# Patient Record
Sex: Male | Born: 1965
Health system: Southern US, Community
[De-identification: ages and names within clinical notes are randomized; demographics above are authoritative.]

## PROBLEM LIST (undated history)

## (undated) DIAGNOSIS — I10 Essential (primary) hypertension: Secondary | ICD-10-CM

## (undated) DIAGNOSIS — E119 Type 2 diabetes mellitus without complications: Secondary | ICD-10-CM

## (undated) DIAGNOSIS — T7840XA Allergy, unspecified, initial encounter: Secondary | ICD-10-CM

## (undated) HISTORY — DX: Allergy, unspecified, initial encounter: T78.40XA

---

## 2002-04-12 ENCOUNTER — Emergency Department (HOSPITAL_COMMUNITY): Admission: EM | Admit: 2002-04-12 | Discharge: 2002-04-12 | Payer: Self-pay | Admitting: Emergency Medicine

## 2004-02-22 ENCOUNTER — Emergency Department (HOSPITAL_COMMUNITY): Admission: EM | Admit: 2004-02-22 | Discharge: 2004-02-22 | Payer: Self-pay | Admitting: Emergency Medicine

## 2015-09-15 ENCOUNTER — Encounter (HOSPITAL_COMMUNITY): Payer: Self-pay | Admitting: Emergency Medicine

## 2015-09-15 ENCOUNTER — Inpatient Hospital Stay (HOSPITAL_COMMUNITY)
Admission: EM | Admit: 2015-09-15 | Discharge: 2015-09-19 | DRG: 638 | Disposition: A | Discharge status: Home / Self Care | Payer: Self-pay | Attending: Internal Medicine | Admitting: Internal Medicine

## 2015-09-15 DIAGNOSIS — E1110 Type 2 diabetes mellitus with ketoacidosis without coma: Secondary | ICD-10-CM | POA: Diagnosis present

## 2015-09-15 DIAGNOSIS — E119 Type 2 diabetes mellitus without complications: Secondary | ICD-10-CM

## 2015-09-15 DIAGNOSIS — Z9114 Patient's other noncompliance with medication regimen: Secondary | ICD-10-CM

## 2015-09-15 DIAGNOSIS — D649 Anemia, unspecified: Secondary | ICD-10-CM | POA: Diagnosis present

## 2015-09-15 DIAGNOSIS — E1310 Other specified diabetes mellitus with ketoacidosis without coma: Principal | ICD-10-CM | POA: Diagnosis present

## 2015-09-15 DIAGNOSIS — N179 Acute kidney failure, unspecified: Secondary | ICD-10-CM | POA: Diagnosis present

## 2015-09-15 DIAGNOSIS — E876 Hypokalemia: Secondary | ICD-10-CM | POA: Diagnosis present

## 2015-09-15 HISTORY — DX: Essential (primary) hypertension: I10

## 2015-09-15 HISTORY — DX: Type 2 diabetes mellitus without complications: E11.9

## 2015-09-15 LAB — BASIC METABOLIC PANEL
BUN: 56 mg/dL — ABNORMAL HIGH (ref 6–20)
CO2: <7 mmol/L — ABNORMAL LOW (ref 22–32)
Calcium: 9.1 mg/dL (ref 8.9–10.3)
Chloride: 105 mmol/L (ref 101–111)
Creatinine, Ser: 3.09 mg/dL — ABNORMAL HIGH (ref 0.61–1.24)
GFR calc Af Amer: 25 mL/min — ABNORMAL LOW (ref >60)
GFR calc non Af Amer: 22 mL/min — ABNORMAL LOW (ref >60)
Glucose, Bld: 696 mg/dL (ref 65–99)
Potassium: 5.8 mmol/L — ABNORMAL HIGH (ref 3.5–5.1)
Sodium: 147 mmol/L — ABNORMAL HIGH (ref 135–145)

## 2015-09-15 LAB — BLOOD GAS, ARTERIAL
Acid-base deficit: 26.6 mmol/L — ABNORMAL HIGH (ref 0.0–2.0)
Bicarbonate: 3.7 mEq/L — ABNORMAL LOW (ref 20.0–24.0)
Drawn by: 232811
FIO2: 0.21
O2 Saturation: 97.2 %
Patient temperature: 97.4
TCO2: 3.6 mmol/L (ref 0–100)
pCO2 arterial: 13.1 mmHg — CL (ref 35.0–45.0)
pH, Arterial: 7.069 — CL (ref 7.350–7.450)
pO2, Arterial: 130 mmHg — ABNORMAL HIGH (ref 80.0–100.0)

## 2015-09-15 LAB — CBC WITH DIFFERENTIAL/PLATELET
Basophils Absolute: 0 10*3/uL (ref 0.0–0.1)
Basophils Relative: 0 %
Eosinophils Absolute: 0 10*3/uL (ref 0.0–0.7)
Eosinophils Relative: 0 %
HCT: 47.5 % (ref 39.0–52.0)
Hemoglobin: 15.1 g/dL (ref 13.0–17.0)
Lymphocytes Relative: 4 %
Lymphs Abs: 0.7 10*3/uL (ref 0.7–4.0)
MCH: 34.8 pg — ABNORMAL HIGH (ref 26.0–34.0)
MCHC: 31.8 g/dL (ref 30.0–36.0)
MCV: 109.4 fL — ABNORMAL HIGH (ref 78.0–100.0)
Monocytes Absolute: 0.8 10*3/uL (ref 0.1–1.0)
Monocytes Relative: 5 %
Neutro Abs: 13.9 10*3/uL — ABNORMAL HIGH (ref 1.7–7.7)
Neutrophils Relative %: 91 %
Platelets: 363 10*3/uL (ref 150–400)
RBC: 4.34 MIL/uL (ref 4.22–5.81)
RDW: 12.1 % (ref 11.5–15.5)
WBC: 15.5 10*3/uL — ABNORMAL HIGH (ref 4.0–10.5)

## 2015-09-15 LAB — CBG MONITORING, ED
Glucose-Capillary: >600 mg/dL (ref 65–99)
Glucose-Capillary: >600 mg/dL (ref 65–99)

## 2015-09-15 MED ORDER — SODIUM CHLORIDE 0.9 % IV BOLUS (SEPSIS)
1000.0000 mL | Freq: Once | INTRAVENOUS | Status: AC
  Administered 2015-09-15: 1000 mL via INTRAVENOUS

## 2015-09-15 MED ORDER — INSULIN REGULAR HUMAN 100 UNIT/ML IJ SOLN
INTRAMUSCULAR | Status: DC
  Administered 2015-09-15: 5.4 [IU]/h via INTRAVENOUS
  Filled 2015-09-15: qty 2.5

## 2015-09-15 MED ORDER — INSULIN ASPART 100 UNIT/ML ~~LOC~~ SOLN
10.0000 [IU] | Freq: Once | SUBCUTANEOUS | Status: AC
  Administered 2015-09-15: 10 [IU] via SUBCUTANEOUS
  Filled 2015-09-15: qty 1

## 2015-09-15 MED ORDER — SODIUM CHLORIDE 0.9 % IV BOLUS (SEPSIS)
1000.0000 mL | Freq: Once | INTRAVENOUS | Status: AC
  Administered 2015-09-16: 1000 mL via INTRAVENOUS

## 2015-09-15 MED ORDER — SODIUM CHLORIDE 0.9 % IV SOLN
INTRAVENOUS | Status: DC
Start: 2015-09-16 — End: 2015-09-17

## 2015-09-15 NOTE — ED Notes (Signed)
EKG given to EDP,Lockwood,MD., for review. 

## 2015-09-15 NOTE — ED Notes (Signed)
Notified RN,Christine, pt. CBG reading critical high on meter > 600.

## 2015-09-15 NOTE — ED Provider Notes (Addendum)
Medical screening examination/treatment/procedure(s) were conducted as a shared visit with non-physician practitioner(s) and myself.  I personally evaluated the patient during the encounter.   EKG Interpretation   Date/Time:  Saturday September 15 2015 23:09:09 EST Ventricular Rate:  119 PR Interval:  131 QRS Duration: 85 QT Interval:  356 QTC Calculation: 501 R Axis:   73 Text Interpretation:  Sinus tachycardia Probable left atrial enlargement  Borderline repolarization abnormality Prolonged QT interval  No prior EKG  for comparison  Confirmed by Nazeer Romney MD, Tabetha Haraway [phone]) on 09/15/2015 11:11:56 PM      CRITICAL CARE Performed by: Lavera Guise   Total critical care time: 30 minutes  Critical care time was exclusive of separately billable procedures and treating other patients.  Critical care was necessary to treat or prevent imminent or life-threatening deterioration.  Critical care was time spent personally by me on the following activities: development of treatment plan with patient and/or surrogate as well as nursing, discussions with consultants, evaluation of patient's response to treatment, examination of patient, obtaining history from patient or surrogate, ordering and performing treatments and interventions, ordering and review of laboratory studies, ordering and review of radiographic studies, pulse oximetry and re-evaluation of patient's condition.   50 year old male with history of non-insulin-dependent diabetes who presents with hyperglycemia. Has not been compliant with metformin, and has recently reported increased fatigue, polyuria and polydipsia over the past 3-4 days. Has not had chest pain, difficulty breathing, fevers, nausea or vomiting, diarrhea, abdominal pain, cough or shortness of breath. On arrival with glucose greater than 600. Is tachycardic and appears dry on exam. Appears listless and mildly confused. Concern for severe DKA given anion gap of 30, pH of 7.0,  bicarbonate 3. Is resuscitated with IV fluids and started on insulin drip. Admitted to ICU for ongoing management.  Lavera Guise, MD 09/15/15 2356  Lavera Guise, MD 09/15/15 [phone]

## 2015-09-15 NOTE — ED Notes (Signed)
Pt presents via EMS from home with hyperglycemia (HIGH on EMS glucometer) and hypotension (80/50 per EMS).  600cc normal saline infused en route, new BP 110/60 per EMS.  NIDDM with hypertension, altered mental status on arrival.  Lethargic and slow to respond but alert and oriented intermittently.

## 2015-09-15 NOTE — ED Provider Notes (Signed)
CSN: [id number]     Arrival date & time 09/15/15  2036 History   First MD Initiated Contact with Patient 09/15/15 2050     Chief Complaint  Patient presents with  . Hyperglycemia  . Altered Mental Status     (Consider location/radiation/quality/duration/timing/severity/associated sxs/prior Treatment) HPI Comments: Patient presents to the emergency department with chief complaint of hyperglycemia. He states that he has been feeling exceptionally thirsty and fatigued for the past couple days. He also reports increased urinary frequency. He states that he takes insulin and metformin, but doesn't normally need to use the insulin.  (Patient stated later that he has been "weening" himself off metformin for the past 5 months).  He denies any fevers, chills, cough, cp, sob, abdominal pain.  There are no aggravating or alleviating factors.  The history is provided by the patient. No language interpreter was used.    Past Medical History  Diagnosis Date  . Diabetes mellitus without complication (HCC)   . Hypertension    History reviewed. No pertinent past surgical history. No family history on file. Social History  Substance Use Topics  . Smoking status: Never Smoker   . Smokeless tobacco: Never Used  . Alcohol Use: Yes     Comment: occasional    Review of Systems  Constitutional: Positive for fatigue. Negative for fever and chills.  Respiratory: Negative for shortness of breath.   Cardiovascular: Negative for chest pain.  Gastrointestinal: Negative for nausea, vomiting, diarrhea and constipation.  Endocrine: Positive for polydipsia and polyuria.  Genitourinary: Negative for dysuria.  All other systems reviewed and are negative.     Allergies  Review of patient's allergies indicates no known allergies.  Home Medications   Prior to Admission medications   Medication Sig Start Date End Date Taking? Authorizing Provider  METFORMIN HCL PO Take by mouth.   Yes Historical Provider,  MD  PRESCRIPTION MEDICATION insulin   Yes Historical Provider, MD   BP 105/68 mmHg  Pulse 119  Temp(Src) 97.4 F (36.3 C) (Oral)  Resp 18  SpO2 99% Physical Exam  Constitutional: He is oriented to person, place, and time. He appears well-developed and well-nourished.  HENT:  Head: Normocephalic and atraumatic.  Eyes: Conjunctivae and EOM are normal. Pupils are equal, round, and reactive to light. Right eye exhibits no discharge. Left eye exhibits no discharge. No scleral icterus.  Neck: Normal range of motion. Neck supple. No JVD present.  Cardiovascular: Regular rhythm and normal heart sounds.  Exam reveals no gallop and no friction rub.   No murmur heard. tachycardia  Pulmonary/Chest: Effort normal and breath sounds normal. No respiratory distress. He has no wheezes. He has no rales. He exhibits no tenderness.  Abdominal: Soft. He exhibits no distension and no mass. There is no tenderness. There is no rebound and no guarding.  Musculoskeletal: Normal range of motion. He exhibits no edema or tenderness.  Neurological: He is alert and oriented to person, place, and time.  Skin: Skin is warm and dry.  Psychiatric: He has a normal mood and affect. His behavior is normal. Judgment and thought content normal.  Nursing note and vitals reviewed.   ED Course  Procedures (including critical care time) Results for orders placed or performed during the hospital encounter of 09/15/15  CBC with Differential/Platelet  Result Value Ref Range   WBC 15.5 (H) 4.0 - 10.5 K/uL   RBC 4.34 4.22 - 5.81 MIL/uL   Hemoglobin 15.1 13.0 - 17.0 g/dL   HCT 69.6 29.5 -  52.0 %   MCV 109.4 (H) 78.0 - 100.0 fL   MCH 34.8 (H) 26.0 - 34.0 pg   MCHC 31.8 30.0 - 36.0 g/dL   RDW 16.1 09.6 - 04.5 %   Platelets 363 150 - 400 K/uL   Neutrophils Relative % 91 %   Neutro Abs 13.9 (H) 1.7 - 7.7 K/uL   Lymphocytes Relative 4 %   Lymphs Abs 0.7 0.7 - 4.0 K/uL   Monocytes Relative 5 %   Monocytes Absolute 0.8 0.1 -  1.0 K/uL   Eosinophils Relative 0 %   Eosinophils Absolute 0.0 0.0 - 0.7 K/uL   Basophils Relative 0 %   Basophils Absolute 0.0 0.0 - 0.1 K/uL  Basic metabolic panel  Result Value Ref Range   Sodium 147 (H) 135 - 145 mmol/L   Potassium 5.8 (H) 3.5 - 5.1 mmol/L   Chloride 105 101 - 111 mmol/L   CO2 <7 (L) 22 - 32 mmol/L   Glucose, Bld 696 (HH) 65 - 99 mg/dL   BUN 56 (H) 6 - 20 mg/dL   Creatinine, Ser 4.09 (H) 0.61 - 1.24 mg/dL   Calcium 9.1 8.9 - 81.1 mg/dL   GFR calc non Af Amer 22 (L) >60 mL/min   GFR calc Af Amer 25 (L) >60 mL/min   Anion gap NOT CALCULATED 5 - 15  CBG monitoring, ED  Result Value Ref Range   Glucose-Capillary >600 (HH) 65 - 99 mg/dL   Comment 1 Notify RN    No results found.  I have personally reviewed and evaluated these images and lab results as part of my medical decision-making.   EKG Interpretation   Date/Time:  Saturday September 15 2015 23:09:09 EST Ventricular Rate:  119 PR Interval:  131 QRS Duration: 85 QT Interval:  356 QTC Calculation: 501 R Axis:   73 Text Interpretation:  Sinus tachycardia Probable left atrial enlargement  Borderline repolarization abnormality Prolonged QT interval  No prior EKG  for comparison  Confirmed by LIU MD, Annabelle Harman ([id number]) on 09/15/2015 11:11:56 PM      MDM   Final diagnoses:  Diabetic ketoacidosis without coma associated with type 2 diabetes mellitus (HCC)   Patient with NIDDM presents to the ED with a chief complaint of hyperglycemia.  Doesn't normally take insulin, but has tried it over the past couple of days.  Normally takes metformin.  No know prior kidney failure.  10:33 PM  Received BMP.  Critical values reviewed with Dr. Verdie Mosher.  Will start patient on insulin drip and plan for admission.  ABG ordered.  Anion gap is 35.  Patient seen by and discussed with Dr. Verdie Mosher, who agrees with plan for admission.  During assessment by Dr. Verdie Mosher, patient states that he is trying to ween himself of metformin, but also  says that he is taking it as directed.  It is unclear what is true, but high index of suspicion that patient is non-compliant.  Discussed with Dr. Katrinka Blazing with [facility], who recommends consultation with critical care for admission to ICU based on AMS and severe DKA.    Appreciate consultation with Dr. Darrick Penna from critical care, who will have the patient admitted.  Will continue giving fluids and insulin drip.  CRITICAL CARE Performed by: Roxy Horseman   Total critical care time: 45 minutes  Critical care time was exclusive of separately billable procedures and treating other patients.  Critical care was necessary to treat or prevent imminent or life-threatening deterioration.  Critical care was time  spent personally by me on the following activities: development of treatment plan with patient and/or surrogate as well as nursing, discussions with consultants, evaluation of patient's response to treatment, examination of patient, obtaining history from patient or surrogate, ordering and performing treatments and interventions, ordering and review of laboratory studies, ordering and review of radiographic studies, pulse oximetry and re-evaluation of patient's condition.     Roxy Horseman, PA-C 09/15/15 2354  Lavera Guise, MD 09/16/15 0005

## 2015-09-15 NOTE — ED Notes (Signed)
Pt.attempted to urinate, but unsuccessful.

## 2015-09-15 NOTE — ED Notes (Signed)
Bed: [facility] Expected date:  Expected time:  Means of arrival:  Comments: EMS 49yo M hyperglycemia

## 2015-09-15 NOTE — ED Notes (Signed)
Notified RN,Christina pt.CBG reading critical high on meter > than 600.

## 2015-09-16 DIAGNOSIS — E1110 Type 2 diabetes mellitus with ketoacidosis without coma: Secondary | ICD-10-CM | POA: Diagnosis present

## 2015-09-16 DIAGNOSIS — E1310 Other specified diabetes mellitus with ketoacidosis without coma: Secondary | ICD-10-CM

## 2015-09-16 LAB — BASIC METABOLIC PANEL
Anion gap: 28 — ABNORMAL HIGH (ref 5–15)
Anion gap: 20 — ABNORMAL HIGH (ref 5–15)
Anion gap: 16 — ABNORMAL HIGH (ref 5–15)
Anion gap: 10 (ref 5–15)
Anion gap: 10 (ref 5–15)
BUN: 58 mg/dL — ABNORMAL HIGH (ref 6–20)
BUN: 50 mg/dL — ABNORMAL HIGH (ref 6–20)
BUN: 44 mg/dL — ABNORMAL HIGH (ref 6–20)
BUN: 35 mg/dL — ABNORMAL HIGH (ref 6–20)
BUN: 32 mg/dL — ABNORMAL HIGH (ref 6–20)
CO2: 9 mmol/L — ABNORMAL LOW (ref 22–32)
CO2: 11 mmol/L — ABNORMAL LOW (ref 22–32)
CO2: 12 mmol/L — ABNORMAL LOW (ref 22–32)
CO2: 19 mmol/L — ABNORMAL LOW (ref 22–32)
CO2: 20 mmol/L — ABNORMAL LOW (ref 22–32)
Calcium: 9 mg/dL (ref 8.9–10.3)
Calcium: 8.3 mg/dL — ABNORMAL LOW (ref 8.9–10.3)
Calcium: 8.4 mg/dL — ABNORMAL LOW (ref 8.9–10.3)
Calcium: 8.5 mg/dL — ABNORMAL LOW (ref 8.9–10.3)
Calcium: 8.3 mg/dL — ABNORMAL LOW (ref 8.9–10.3)
Chloride: 113 mmol/L — ABNORMAL HIGH (ref 101–111)
Chloride: 119 mmol/L — ABNORMAL HIGH (ref 101–111)
Chloride: 123 mmol/L — ABNORMAL HIGH (ref 101–111)
Chloride: 123 mmol/L — ABNORMAL HIGH (ref 101–111)
Chloride: 119 mmol/L — ABNORMAL HIGH (ref 101–111)
Creatinine, Ser: 2.79 mg/dL — ABNORMAL HIGH (ref 0.61–1.24)
Creatinine, Ser: 2.16 mg/dL — ABNORMAL HIGH (ref 0.61–1.24)
Creatinine, Ser: 1.85 mg/dL — ABNORMAL HIGH (ref 0.61–1.24)
Creatinine, Ser: 1.49 mg/dL — ABNORMAL HIGH (ref 0.61–1.24)
Creatinine, Ser: 1.4 mg/dL — ABNORMAL HIGH (ref 0.61–1.24)
GFR calc Af Amer: 29 mL/min — ABNORMAL LOW (ref >60)
GFR calc Af Amer: 39 mL/min — ABNORMAL LOW (ref >60)
GFR calc Af Amer: 47 mL/min — ABNORMAL LOW (ref >60)
GFR calc Af Amer: >60 mL/min (ref >60)
GFR calc Af Amer: >60 mL/min (ref >60)
GFR calc non Af Amer: 25 mL/min — ABNORMAL LOW (ref >60)
GFR calc non Af Amer: 34 mL/min — ABNORMAL LOW (ref >60)
GFR calc non Af Amer: 41 mL/min — ABNORMAL LOW (ref >60)
GFR calc non Af Amer: 53 mL/min — ABNORMAL LOW (ref >60)
GFR calc non Af Amer: 57 mL/min — ABNORMAL LOW (ref >60)
Glucose, Bld: 470 mg/dL — ABNORMAL HIGH (ref 65–99)
Glucose, Bld: 300 mg/dL — ABNORMAL HIGH (ref 65–99)
Glucose, Bld: 198 mg/dL — ABNORMAL HIGH (ref 65–99)
Glucose, Bld: 138 mg/dL — ABNORMAL HIGH (ref 65–99)
Glucose, Bld: 136 mg/dL — ABNORMAL HIGH (ref 65–99)
Potassium: 5.3 mmol/L — ABNORMAL HIGH (ref 3.5–5.1)
Potassium: 4.5 mmol/L (ref 3.5–5.1)
Potassium: 4.2 mmol/L (ref 3.5–5.1)
Potassium: 3.7 mmol/L (ref 3.5–5.1)
Potassium: 3.4 mmol/L — ABNORMAL LOW (ref 3.5–5.1)
Sodium: 150 mmol/L — ABNORMAL HIGH (ref 135–145)
Sodium: 150 mmol/L — ABNORMAL HIGH (ref 135–145)
Sodium: 151 mmol/L — ABNORMAL HIGH (ref 135–145)
Sodium: 152 mmol/L — ABNORMAL HIGH (ref 135–145)
Sodium: 149 mmol/L — ABNORMAL HIGH (ref 135–145)

## 2015-09-16 LAB — GLUCOSE, CAPILLARY
Glucose-Capillary: 309 mg/dL — ABNORMAL HIGH (ref 65–99)
Glucose-Capillary: 294 mg/dL — ABNORMAL HIGH (ref 65–99)
Glucose-Capillary: 268 mg/dL — ABNORMAL HIGH (ref 65–99)
Glucose-Capillary: 222 mg/dL — ABNORMAL HIGH (ref 65–99)
Glucose-Capillary: 294 mg/dL — ABNORMAL HIGH (ref 65–99)
Glucose-Capillary: 265 mg/dL — ABNORMAL HIGH (ref 65–99)
Glucose-Capillary: 191 mg/dL — ABNORMAL HIGH (ref 65–99)
Glucose-Capillary: 175 mg/dL — ABNORMAL HIGH (ref 65–99)
Glucose-Capillary: 127 mg/dL — ABNORMAL HIGH (ref 65–99)
Glucose-Capillary: 142 mg/dL — ABNORMAL HIGH (ref 65–99)
Glucose-Capillary: 129 mg/dL — ABNORMAL HIGH (ref 65–99)

## 2015-09-16 LAB — CBC
HCT: 40.3 % (ref 39.0–52.0)
Hemoglobin: 13.1 g/dL (ref 13.0–17.0)
MCH: 34.6 pg — ABNORMAL HIGH (ref 26.0–34.0)
MCHC: 32.5 g/dL (ref 30.0–36.0)
MCV: 106.3 fL — ABNORMAL HIGH (ref 78.0–100.0)
Platelets: 279 10*3/uL (ref 150–400)
RBC: 3.79 MIL/uL — ABNORMAL LOW (ref 4.22–5.81)
RDW: 12.3 % (ref 11.5–15.5)
WBC: 12.2 10*3/uL — ABNORMAL HIGH (ref 4.0–10.5)

## 2015-09-16 LAB — MRSA PCR SCREENING: MRSA by PCR: NEGATIVE

## 2015-09-16 LAB — URINE MICROSCOPIC-ADD ON: RBC / HPF: NONE SEEN RBC/hpf (ref 0–5)

## 2015-09-16 LAB — URINALYSIS, ROUTINE W REFLEX MICROSCOPIC
Glucose, UA: >1000 mg/dL — AB
Ketones, ur: >80 mg/dL — AB
Leukocytes, UA: NEGATIVE
Nitrite: NEGATIVE
Protein, ur: 30 mg/dL — AB
Specific Gravity, Urine: 1.025 (ref 1.005–1.030)
pH: 5 (ref 5.0–8.0)

## 2015-09-16 LAB — LACTIC ACID, PLASMA
Lactic Acid, Venous: 2.1 mmol/L (ref 0.5–2.0)
Lactic Acid, Venous: 0.9 mmol/L (ref 0.5–2.0)

## 2015-09-16 LAB — CBG MONITORING, ED
Glucose-Capillary: 464 mg/dL — ABNORMAL HIGH (ref 65–99)
Glucose-Capillary: 536 mg/dL — ABNORMAL HIGH (ref 65–99)

## 2015-09-16 MED ORDER — POTASSIUM CHLORIDE 10 MEQ/100ML IV SOLN
10.0000 meq | INTRAVENOUS | Status: AC
  Administered 2015-09-16 (×4): 10 meq via INTRAVENOUS
  Filled 2015-09-16 (×4): qty 100

## 2015-09-16 MED ORDER — INSULIN GLARGINE 100 UNIT/ML ~~LOC~~ SOLN
15.0000 [IU] | Freq: Every day | SUBCUTANEOUS | Status: DC
  Administered 2015-09-16: 15 [IU] via SUBCUTANEOUS
  Filled 2015-09-16: qty 0.15

## 2015-09-16 MED ORDER — INSULIN REGULAR HUMAN 100 UNIT/ML IJ SOLN
INTRAMUSCULAR | Status: DC
  Filled 2015-09-16: qty 2.5

## 2015-09-16 MED ORDER — INSULIN ASPART 100 UNIT/ML ~~LOC~~ SOLN
2.0000 [IU] | SUBCUTANEOUS | Status: DC
  Administered 2015-09-16: 2 [IU] via SUBCUTANEOUS

## 2015-09-16 MED ORDER — SODIUM CHLORIDE 0.9 % IV SOLN
INTRAVENOUS | Status: DC
  Administered 2015-09-16: 02:00:00 via INTRAVENOUS

## 2015-09-16 MED ORDER — SODIUM CHLORIDE 0.9 % IV SOLN
INTRAVENOUS | Status: DC
  Administered 2015-09-16: 04:00:00 via INTRAVENOUS

## 2015-09-16 MED ORDER — HEPARIN SODIUM (PORCINE) 5000 UNIT/ML IJ SOLN
5000.0000 [IU] | Freq: Three times a day (TID) | INTRAMUSCULAR | Status: DC
  Administered 2015-09-16 – 2015-09-19 (×10): 5000 [IU] via SUBCUTANEOUS
  Filled 2015-09-16 (×13): qty 1

## 2015-09-16 MED ORDER — DEXTROSE 10 % IV SOLN
INTRAVENOUS | Status: DC | PRN
  Administered 2015-09-16: 21:00:00 via INTRAVENOUS

## 2015-09-16 MED ORDER — DEXTROSE-NACL 5-0.45 % IV SOLN
INTRAVENOUS | Status: DC
  Administered 2015-09-16: 05:00:00 via INTRAVENOUS

## 2015-09-16 NOTE — H&P (Signed)
PULMONARY / CRITICAL CARE MEDICINE   Name: Stanley Edwards MRN: [id number] DOB: Sep 04, 1965    ADMISSION DATE:  09/15/2015 CONSULTATION DATE:  09/15/15   REFERRING MD:  Dr. Verdie Mosher  CHIEF COMPLAINT:  DKA  HISTORY OF PRESENT ILLNESS:   Stanley Edwards is a 50 y/o man with DM who presented to the [facility] ED with a history of increased thurst, increased urination and AMS.  Stanley Edwards was found to be hyperglycemic and severely acidotic with a pH of 7.0.  Stanley Edwards has used insulin before but stated that Stanley Edwards didn't really need it. Stanley Edwards is also taking metformin but has been trying to wean himself off it according to the ED notes.   PAST MEDICAL HISTORY :  Stanley Edwards  has a past medical history of Diabetes mellitus without complication (HCC) and Hypertension.  PAST SURGICAL HISTORY: Stanley Edwards  has no past surgical history on file.  No Known Allergies  No current facility-administered medications on file prior to encounter.   No current outpatient prescriptions on file prior to encounter.    FAMILY HISTORY:  His has no family status information on file.   SOCIAL HISTORY: Stanley Edwards  reports that Stanley Edwards has never smoked. Stanley Edwards has never used smokeless tobacco. Stanley Edwards reports that Stanley Edwards drinks alcohol. Stanley Edwards reports that Stanley Edwards does not use illicit drugs.  REVIEW OF SYSTEMS:   Could not be obtained as patient was unable to answer questions.   SUBJECTIVE:  50 y/o man with DM and DKA  VITAL SIGNS: BP 127/69 mmHg  Pulse 111  Temp(Src) 98.2 F (36.8 C) (Oral)  Resp 14  SpO2 99%  HEMODYNAMICS:    VENTILATOR SETTINGS:    INTAKE / OUTPUT:    PHYSICAL EXAMINATION: General:  Laying in on stretcher, arousable but not answering questons Neuro:  Oriented to person HEENT:  PERRL, EOMI, OP clear Cardiovascular:  Tachycardic, regular rhythm, no MRG Lungs:  Tachypneic, kussmaul respirations, no wrr Abdomen:  Soft, NTND, no palpable HSM Musculoskeletal:  No edema Skin:  No rashes or other lesions  LABS:  BMET  Recent Labs Lab 09/15/15 2144  09/16/15 0140  NA 147* 150*  K 5.8* 5.3*  CL 105 113*  CO2 <7* 9*  BUN 56* 58*  CREATININE 3.09* 2.79*  GLUCOSE 696* 470*    Electrolytes  Recent Labs Lab 09/15/15 2144 09/16/15 0140  CALCIUM 9.1 9.0    CBC  Recent Labs Lab 09/15/15 2144  WBC 15.5*  HGB 15.1  HCT 47.5  PLT 363    Coag's No results for input(s): APTT, INR in the last 168 hours.  Sepsis Markers  Recent Labs Lab 09/16/15 0141  LATICACIDVEN 2.1*    ABG  Recent Labs Lab 09/15/15 2240  PHART 7.069*  PCO2ART 13.1*  PO2ART 130*    Liver Enzymes No results for input(s): AST, ALT, ALKPHOS, BILITOT, ALBUMIN in the last 168 hours.  Cardiac Enzymes No results for input(s): TROPONINI, PROBNP in the last 168 hours.  Glucose  Recent Labs Lab 09/15/15 2119 09/15/15 2304 09/16/15 0017 09/16/15 0127  GLUCAP >600* >600* 536* 464*    Imaging No results found.   STUDIES:    CULTURES:   ANTIBIOTICS:   SIGNIFICANT EVENTS:   LINES/TUBES:   DISCUSSION: 50 y/o with DKA and severe acidosis.  Admitted to ICU for insulin GTT  ASSESSMENT / PLAN:  PULMONARY A: Tachypnia P:   2/2 acidosis  CARDIOVASCULAR A:  Tachycardia P:  2/2 volume depletion and acidosis Bolus NS  RENAL A:    hyperkleamia,  AKI P:  Likely secondary to volume depletion Monitor K q4h Will need supplementation when K<5.  ENDOCRINE A:   DKA   P:   Insulin gtt IV fluids --- add glucose when CBG<250 q4h BMP Continue drip until gap closed  NEUROLOGIC A:   AMS P:   2/2 DKA - continue to follow   FAMILY  - Updates: - no family at bedside at time of admission  - Inter-disciplinary family meet or Palliative Care meeting due by:  day 7  I spent 35 minutes of critical care time in the care of this patient seperate from procedures which are documented elsewhere   Sandi Carne MD, PhD Pulmonary and Critical Care Medicine [facility] Pager: [phone]  09/16/2015, 4:02  AM

## 2015-09-16 NOTE — Progress Notes (Signed)
eLink Physician-Brief Progress Note Patient Name: Stanley Edwards DOB: 1966/04/06 MRN: [id number]   Date of Service  09/16/2015  HPI/Events of Note  Multiple issues: 1. K+ = 3.4 and Creatinine = 1.4 and 2. Anion gap = 10 and blood glucose = 136.  eICU Interventions  Will order: 1. Replete K+. 2. Transition to Lantus and Novolog SSI. 3. D/C insulin IV infusion.      Intervention Category Major Interventions: Hyperglycemia - active titration of insulin therapy;Electrolyte abnormality - evaluation and management  Rickita Forstner Eugene 09/16/2015, 6:02 PM

## 2015-09-16 NOTE — Progress Notes (Signed)
PCCM Interval Note  Pt sleepy but comfortable, woke up to voice and interacted, followed instructions.   Continue aggressive IVF, insulin, frequent labs and adjustments,.   Levy Pupa, MD, PhD 09/16/2015, 7:11 AM [facility] [phone] or if no answer [phone]

## 2015-09-16 NOTE — Progress Notes (Signed)
Utilization review completed.  

## 2015-09-16 NOTE — ED Notes (Signed)
Unable to collect urine sample, patient was incontinent of urine.

## 2015-09-17 ENCOUNTER — Encounter (HOSPITAL_COMMUNITY): Payer: Self-pay

## 2015-09-17 DIAGNOSIS — R739 Hyperglycemia, unspecified: Secondary | ICD-10-CM

## 2015-09-17 DIAGNOSIS — E1010 Type 1 diabetes mellitus with ketoacidosis without coma: Secondary | ICD-10-CM

## 2015-09-17 DIAGNOSIS — Z9114 Patient's other noncompliance with medication regimen: Secondary | ICD-10-CM

## 2015-09-17 LAB — GLUCOSE, CAPILLARY
Glucose-Capillary: 121 mg/dL — ABNORMAL HIGH (ref 65–99)
Glucose-Capillary: 136 mg/dL — ABNORMAL HIGH (ref 65–99)
Glucose-Capillary: 141 mg/dL — ABNORMAL HIGH (ref 65–99)
Glucose-Capillary: 143 mg/dL — ABNORMAL HIGH (ref 65–99)
Glucose-Capillary: 147 mg/dL — ABNORMAL HIGH (ref 65–99)
Glucose-Capillary: 149 mg/dL — ABNORMAL HIGH (ref 65–99)
Glucose-Capillary: 155 mg/dL — ABNORMAL HIGH (ref 65–99)
Glucose-Capillary: 164 mg/dL — ABNORMAL HIGH (ref 65–99)
Glucose-Capillary: 183 mg/dL — ABNORMAL HIGH (ref 65–99)
Glucose-Capillary: 187 mg/dL — ABNORMAL HIGH (ref 65–99)
Glucose-Capillary: 190 mg/dL — ABNORMAL HIGH (ref 65–99)
Glucose-Capillary: 230 mg/dL — ABNORMAL HIGH (ref 65–99)
Glucose-Capillary: 443 mg/dL — ABNORMAL HIGH (ref 65–99)
Glucose-Capillary: 529 mg/dL — ABNORMAL HIGH (ref 65–99)

## 2015-09-17 LAB — BASIC METABOLIC PANEL
Anion gap: 11 (ref 5–15)
Anion gap: 9 (ref 5–15)
BUN: 22 mg/dL — ABNORMAL HIGH (ref 6–20)
BUN: 22 mg/dL — ABNORMAL HIGH (ref 6–20)
CO2: 20 mmol/L — ABNORMAL LOW (ref 22–32)
CO2: 23 mmol/L (ref 22–32)
Calcium: 8.4 mg/dL — ABNORMAL LOW (ref 8.9–10.3)
Calcium: 8.7 mg/dL — ABNORMAL LOW (ref 8.9–10.3)
Chloride: 111 mmol/L (ref 101–111)
Chloride: 114 mmol/L — ABNORMAL HIGH (ref 101–111)
Creatinine, Ser: 1.28 mg/dL — ABNORMAL HIGH (ref 0.61–1.24)
Creatinine, Ser: 1.19 mg/dL (ref 0.61–1.24)
GFR calc Af Amer: >60 mL/min (ref >60)
GFR calc Af Amer: >60 mL/min (ref >60)
GFR calc non Af Amer: >60 mL/min (ref >60)
GFR calc non Af Amer: >60 mL/min (ref >60)
Glucose, Bld: 445 mg/dL — ABNORMAL HIGH (ref 65–99)
Glucose, Bld: 209 mg/dL — ABNORMAL HIGH (ref 65–99)
Potassium: 3.7 mmol/L (ref 3.5–5.1)
Potassium: 3.7 mmol/L (ref 3.5–5.1)
Sodium: 142 mmol/L (ref 135–145)
Sodium: 146 mmol/L — ABNORMAL HIGH (ref 135–145)

## 2015-09-17 MED ORDER — KCL IN DEXTROSE-NACL 20-5-0.45 MEQ/L-%-% IV SOLN
INTRAVENOUS | Status: DC
  Administered 2015-09-17: via INTRAVENOUS
  Filled 2015-09-17: qty 1000

## 2015-09-17 MED ORDER — INSULIN ASPART 100 UNIT/ML ~~LOC~~ SOLN
0.0000 [IU] | Freq: Three times a day (TID) | SUBCUTANEOUS | Status: DC
  Administered 2015-09-17: 5 [IU] via SUBCUTANEOUS
  Administered 2015-09-17: 3 [IU] via SUBCUTANEOUS
  Administered 2015-09-17: 15 [IU] via SUBCUTANEOUS
  Administered 2015-09-18: 11 [IU] via SUBCUTANEOUS
  Administered 2015-09-18: 8 [IU] via SUBCUTANEOUS
  Administered 2015-09-18: 3 [IU] via SUBCUTANEOUS
  Administered 2015-09-19 (×2): 5 [IU] via SUBCUTANEOUS

## 2015-09-17 MED ORDER — LIVING WELL WITH DIABETES BOOK
Freq: Once | Status: AC
  Administered 2015-09-17: 13:00:00
  Filled 2015-09-17: qty 1

## 2015-09-17 MED ORDER — INSULIN ASPART 100 UNIT/ML ~~LOC~~ SOLN
4.0000 [IU] | Freq: Three times a day (TID) | SUBCUTANEOUS | Status: DC
  Administered 2015-09-17 – 2015-09-19 (×7): 4 [IU] via SUBCUTANEOUS

## 2015-09-17 MED ORDER — INSULIN ASPART 100 UNIT/ML ~~LOC~~ SOLN
0.0000 [IU] | Freq: Every day | SUBCUTANEOUS | Status: DC
  Administered 2015-09-18: 2 [IU] via SUBCUTANEOUS

## 2015-09-17 MED ORDER — INSULIN GLARGINE 100 UNIT/ML ~~LOC~~ SOLN
15.0000 [IU] | Freq: Every day | SUBCUTANEOUS | Status: DC
  Administered 2015-09-17 – 2015-09-18 (×2): 15 [IU] via SUBCUTANEOUS
  Filled 2015-09-17 (×2): qty 0.15

## 2015-09-17 NOTE — Progress Notes (Signed)
PULMONARY / CRITICAL CARE MEDICINE   Name: Stanley Edwards MRN: [id number] DOB: 26-Aug-1965    ADMISSION DATE:  09/15/2015 CONSULTATION DATE:  09/15/15   REFERRING MD:  Dr. Verdie Mosher  CHIEF COMPLAINT:  DKA  BRIEF:   50 y/o male with DM diagnosed a year ago was amitted on 2/20 with DKA after he ran out of his insulin.  SUBJECTIVE:  Feels better Hyperglycemic this morning   VITAL SIGNS: BP 133/63 mmHg  Pulse 80  Temp(Src) 98.6 F (37 C) (Axillary)  Resp 20  Ht  (1.905 m)  Wt 78.3 kg (172 lb 9.9 oz)  BMI 21.58 kg/m2  SpO2 98%  HEMODYNAMICS:    VENTILATOR SETTINGS:    INTAKE / OUTPUT: I/O last 3 completed shifts: In: 5813 [P.O.:600; I.V.:4813; IV Piggyback:400] Out: 3050 [Urine:3050]  PHYSICAL EXAMINATION: General: comfortable in bed HENT: NCAT OP clear PULM: CTA B, normal effort CV: RRR, no mgr GI: BS+, soft, nontender MSK: normal bulk and tone Neuro: A&Ox4, maew  LABS:  BMET  Recent Labs Lab 09/16/15 0818 09/16/15 1208 09/16/15 1627  NA 151* 152* 149*  K 4.2 3.7 3.4*  CL 123* 123* 119*  CO2 12* 19* 20*  BUN 44* 35* 32*  CREATININE 1.85* 1.49* 1.40*  GLUCOSE 198* 138* 136*    Electrolytes  Recent Labs Lab 09/16/15 0818 09/16/15 1208 09/16/15 1627  CALCIUM 8.4* 8.5* 8.3*    CBC  Recent Labs Lab 09/15/15 2144 09/16/15 0402  WBC 15.5* 12.2*  HGB 15.1 13.1  HCT 47.5 40.3  PLT 363 279    Coag's No results for input(s): APTT, INR in the last 168 hours.  Sepsis Markers  Recent Labs Lab 09/16/15 0141 09/16/15 1210  LATICACIDVEN 2.1* 0.9    ABG  Recent Labs Lab 09/15/15 2240  PHART 7.069*  PCO2ART 13.1*  PO2ART 130*    Liver Enzymes No results for input(s): AST, ALT, ALKPHOS, BILITOT, ALBUMIN in the last 168 hours.  Cardiac Enzymes No results for input(s): TROPONINI, PROBNP in the last 168 hours.  Glucose  Recent Labs Lab 09/16/15 1604 09/16/15 1708 09/16/15 1817 09/16/15 1921 09/16/15 2022 09/17/15 0004   GLUCAP 121* 143* 164* 141* 147* 187*    Imaging No results found.   STUDIES:    CULTURES:   ANTIBIOTICS:   SIGNIFICANT EVENTS:   LINES/TUBES:   DISCUSSION: 50 y/o with DKA and severe acidosis.  Admitted to ICU for insulin drip.  He says this happened because he ran out of his medications.  ASSESSMENT / PLAN:  PULMONARY A: Tachypnea > resolved P:   Monitor O2 saturation  CARDIOVASCULAR A:  Tachycardia > resolved P:  Give normal saline today  RENAL A:   DKA leading to acidosis P:  Check BMET now Monitor UOP  ENDOCRINE A:   DKA  > resolved Hyperglycemia Medication non-compliance (doesn't have doctor in [place] yet) P:   Stop D5 STAT BMET glargine 15 U now Diabetes coordinator  NEUROLOGIC A:   AMS > resolved P:   No sedating meds   FAMILY  - Updates: - no family at bedside  Move to floor, [facility] service  Heber Mogadore, MD [place] [facility] Pager: [phone] Cell: [phone] After 3pm or if no response, call [phone]  09/17/2015, 10:26 AM

## 2015-09-17 NOTE — Progress Notes (Signed)
Inpatient Diabetes Program Recommendations  AACE/ADA: New Consensus Statement on Inpatient Glycemic Control (2015)  Target Ranges:  Prepandial:   less than 140 mg/dL      Peak postprandial:   less than 180 mg/dL (1-2 hours)      Critically ill patients:  140 - 180 mg/dL   Review of Glycemic Control  Diabetes history: DM2 Outpatient Diabetes medications: Had run out of insulin - Previously on Toujeo 9 units bid, Humalog 6 units tidwc, metformin 500 mg bid Current orders for Inpatient glycemic control: Lantus 15 units QD, Novolog moderate tidwc and hs + 4 units tidwc  Results for NUR, KRASINSKI (MRN [id number]) as of 09/17/2015 12:05  Ref. Range 09/17/2015 10:31  Sodium Latest Ref Range: 135-145 mmol/L 142  Potassium Latest Ref Range: 3.5-5.1 mmol/L 3.7  Chloride Latest Ref Range: 101-111 mmol/L 111  CO2 Latest Ref Range: 22-32 mmol/L 20 (L)  BUN Latest Ref Range: 6-20 mg/dL 22 (H)  Creatinine Latest Ref Range: 0.61-1.24 mg/dL 1.28 (H)  Calcium Latest Ref Range: 8.9-10.3 mg/dL 8.4 (L)  EGFR (Non-African Amer.) Latest Ref Range: >60 mL/min >60  EGFR (African American) Latest Ref Range: >60 mL/min >60  Glucose Latest Ref Range: 65-99 mg/dL 445 (H)  Anion gap Latest Ref Range: 5-15  11  DKA resolved.  Spoke with pt regarding his diabetes care. States his MD was in [place] and he has received diabetes education in [place].  Stressed importance of checking blood sugars and taking insulin as prescribed. Pt received info regarding [place] and plans to make appt. Should be able to obtain meds at [facility] upon discharge. Also needs prescription for insulin pen needles. Pt states he wants to control his blood sugars but hasn't had funds to get medicine. Gave info regarding Novolin R, NPH, 70/30 can be purchased at [facility] without prescription.  Recommendations:  Increase Lantus to 24 units QD Will likely need more meal coverage insulin. Watch post-prandials.  Will continue to  follow. Thank you. Lorenda Peck, RD, LDN, CDE Inpatient Diabetes Coordinator [phone]

## 2015-09-17 NOTE — Progress Notes (Signed)
Pt transferred to 1508. AO x 4. PT made aware of surroundings. Bedside table and call bell is within reach. Pt belongings are at bedside. Report received from [place], [place]. No questions or concerns from the pt at this time.  Stacy Deshler W Alsace Dowd, RN

## 2015-09-18 ENCOUNTER — Inpatient Hospital Stay (HOSPITAL_COMMUNITY): Payer: MEDICAID

## 2015-09-18 LAB — BASIC METABOLIC PANEL
Anion gap: 10 (ref 5–15)
BUN: 20 mg/dL (ref 6–20)
CO2: 24 mmol/L (ref 22–32)
Calcium: 8.4 mg/dL — ABNORMAL LOW (ref 8.9–10.3)
Chloride: 111 mmol/L (ref 101–111)
Creatinine, Ser: 1.01 mg/dL (ref 0.61–1.24)
GFR calc Af Amer: >60 mL/min (ref >60)
GFR calc non Af Amer: >60 mL/min (ref >60)
Glucose, Bld: 255 mg/dL — ABNORMAL HIGH (ref 65–99)
Potassium: 3.4 mmol/L — ABNORMAL LOW (ref 3.5–5.1)
Sodium: 145 mmol/L (ref 135–145)

## 2015-09-18 LAB — CBC WITH DIFFERENTIAL/PLATELET
Basophils Absolute: 0 10*3/uL (ref 0.0–0.1)
Basophils Relative: 1 %
Eosinophils Absolute: 0.1 10*3/uL (ref 0.0–0.7)
Eosinophils Relative: 2 %
HCT: 29.3 % — ABNORMAL LOW (ref 39.0–52.0)
Hemoglobin: 9.7 g/dL — ABNORMAL LOW (ref 13.0–17.0)
Lymphocytes Relative: 19 %
Lymphs Abs: 1.1 10*3/uL (ref 0.7–4.0)
MCH: 35.3 pg — ABNORMAL HIGH (ref 26.0–34.0)
MCHC: 33.1 g/dL (ref 30.0–36.0)
MCV: 106.5 fL — ABNORMAL HIGH (ref 78.0–100.0)
Monocytes Absolute: 0.2 10*3/uL (ref 0.1–1.0)
Monocytes Relative: 3 %
Neutro Abs: 4.5 10*3/uL (ref 1.7–7.7)
Neutrophils Relative %: 75 %
Platelets: 198 10*3/uL (ref 150–400)
RBC: 2.75 MIL/uL — ABNORMAL LOW (ref 4.22–5.81)
RDW: 12.5 % (ref 11.5–15.5)
WBC: 5.9 10*3/uL (ref 4.0–10.5)

## 2015-09-18 LAB — GLUCOSE, CAPILLARY
Glucose-Capillary: 258 mg/dL — ABNORMAL HIGH (ref 65–99)
Glucose-Capillary: 311 mg/dL — ABNORMAL HIGH (ref 65–99)
Glucose-Capillary: 167 mg/dL — ABNORMAL HIGH (ref 65–99)
Glucose-Capillary: 235 mg/dL — ABNORMAL HIGH (ref 65–99)

## 2015-09-18 LAB — TSH: TSH: 0.322 u[IU]/mL — ABNORMAL LOW (ref 0.350–4.500)

## 2015-09-18 MED ORDER — POTASSIUM CHLORIDE 20 MEQ/15ML (10%) PO SOLN
40.0000 meq | Freq: Once | ORAL | Status: AC
  Administered 2015-09-18: 40 meq via ORAL
  Filled 2015-09-18: qty 30

## 2015-09-19 DIAGNOSIS — E0810 Diabetes mellitus due to underlying condition with ketoacidosis without coma: Secondary | ICD-10-CM

## 2015-09-19 DIAGNOSIS — E119 Type 2 diabetes mellitus without complications: Secondary | ICD-10-CM

## 2015-09-19 DIAGNOSIS — D649 Anemia, unspecified: Secondary | ICD-10-CM | POA: Diagnosis present

## 2015-09-19 DIAGNOSIS — E876 Hypokalemia: Secondary | ICD-10-CM | POA: Diagnosis present

## 2015-09-19 LAB — COMPREHENSIVE METABOLIC PANEL
ALT: 15 U/L — ABNORMAL LOW (ref 17–63)
AST: 20 U/L (ref 15–41)
Albumin: 3 g/dL — ABNORMAL LOW (ref 3.5–5.0)
Alkaline Phosphatase: 74 U/L (ref 38–126)
Anion gap: 8 (ref 5–15)
BUN: 23 mg/dL — ABNORMAL HIGH (ref 6–20)
CO2: 27 mmol/L (ref 22–32)
Calcium: 8.4 mg/dL — ABNORMAL LOW (ref 8.9–10.3)
Chloride: 107 mmol/L (ref 101–111)
Creatinine, Ser: 1.05 mg/dL (ref 0.61–1.24)
GFR calc Af Amer: >60 mL/min (ref >60)
GFR calc non Af Amer: >60 mL/min (ref >60)
Glucose, Bld: 271 mg/dL — ABNORMAL HIGH (ref 65–99)
Potassium: 3.3 mmol/L — ABNORMAL LOW (ref 3.5–5.1)
Sodium: 142 mmol/L (ref 135–145)
Total Bilirubin: 1.7 mg/dL — ABNORMAL HIGH (ref 0.3–1.2)
Total Protein: 5.9 g/dL — ABNORMAL LOW (ref 6.5–8.1)

## 2015-09-19 LAB — CBC
HCT: 24.8 % — ABNORMAL LOW (ref 39.0–52.0)
Hemoglobin: 8.2 g/dL — ABNORMAL LOW (ref 13.0–17.0)
MCH: 34.7 pg — ABNORMAL HIGH (ref 26.0–34.0)
MCHC: 33.1 g/dL (ref 30.0–36.0)
MCV: 105.1 fL — ABNORMAL HIGH (ref 78.0–100.0)
Platelets: 180 10*3/uL (ref 150–400)
RBC: 2.36 MIL/uL — ABNORMAL LOW (ref 4.22–5.81)
RDW: 12.1 % (ref 11.5–15.5)
WBC: 4 10*3/uL (ref 4.0–10.5)

## 2015-09-19 LAB — RETICULOCYTES
RBC.: 2.61 MIL/uL — ABNORMAL LOW (ref 4.22–5.81)
Retic Count, Absolute: 10.4 10*3/uL — ABNORMAL LOW (ref 19.0–186.0)
Retic Ct Pct: 0.4 % (ref 0.4–3.1)

## 2015-09-19 LAB — GLUCOSE, CAPILLARY
Glucose-Capillary: 238 mg/dL — ABNORMAL HIGH (ref 65–99)
Glucose-Capillary: 218 mg/dL — ABNORMAL HIGH (ref 65–99)
Glucose-Capillary: 183 mg/dL — ABNORMAL HIGH (ref 65–99)

## 2015-09-19 LAB — IRON AND TIBC
Iron: 173 ug/dL (ref 45–182)
Saturation Ratios: 73 % — ABNORMAL HIGH (ref 17.9–39.5)
TIBC: 237 ug/dL — ABNORMAL LOW (ref 250–450)
UIBC: 64 ug/dL

## 2015-09-19 LAB — FERRITIN: Ferritin: 1613 ng/mL — ABNORMAL HIGH (ref 24–336)

## 2015-09-19 LAB — HEMOGLOBIN A1C
Hgb A1c MFr Bld: 12.1 % — ABNORMAL HIGH (ref 4.8–5.6)
Mean Plasma Glucose: 301 mg/dL

## 2015-09-19 LAB — MAGNESIUM: Magnesium: 2.1 mg/dL (ref 1.7–2.4)

## 2015-09-19 LAB — FOLATE: Folate: 13.8 ng/mL (ref >5.9)

## 2015-09-19 LAB — VITAMIN B12: Vitamin B-12: 605 pg/mL (ref 180–914)

## 2015-09-19 MED ORDER — METFORMIN HCL 500 MG PO TABS: 500.0000 mg | ORAL_TABLET | Freq: Two times a day (BID) | ORAL | Status: DC

## 2015-09-19 MED ORDER — INSULIN GLARGINE 100 UNIT/ML ~~LOC~~ SOLN
18.0000 [IU] | Freq: Every day | SUBCUTANEOUS | Status: DC
  Filled 2015-09-19: qty 0.18

## 2015-09-19 MED ORDER — INSULIN NPH ISOPHANE & REGULAR (70-30) 100 UNIT/ML ~~LOC~~ SUSP: 20.0000 [IU] | Freq: Two times a day (BID) | SUBCUTANEOUS | Status: DC

## 2015-09-19 MED ORDER — INSULIN GLARGINE 100 UNIT/ML ~~LOC~~ SOLN
24.0000 [IU] | Freq: Every day | SUBCUTANEOUS | Status: DC
  Administered 2015-09-19: 24 [IU] via SUBCUTANEOUS
  Filled 2015-09-19: qty 0.24

## 2015-09-19 MED ORDER — POTASSIUM CHLORIDE CRYS ER 20 MEQ PO TBCR
40.0000 meq | EXTENDED_RELEASE_TABLET | ORAL | Status: AC
  Administered 2015-09-19: 40 meq via ORAL
  Filled 2015-09-19: qty 2

## 2015-09-19 NOTE — Progress Notes (Signed)
Inpatient Diabetes Program Recommendations  AACE/ADA: New Consensus Statement on Inpatient Glycemic Control (2015)  Target Ranges:  Prepandial:   less than 140 mg/dL      Peak postprandial:   less than 180 mg/dL (1-2 hours)      Critically ill patients:  140 - 180 mg/dL   Review of Glycemic Control  Results for BOND, GRIESHOP (MRN [id number]) as of 09/19/2015 10:23  Ref. Range 09/18/2015 07:34 09/18/2015 11:46 09/18/2015 16:55 09/18/2015 21:01 09/19/2015 07:19  Glucose-Capillary Latest Ref Range: 65-99 mg/dL 528 (H) 413 (H) 244 (H) 235 (H) 238 (H)   Continue to titrate insulin. To be discharged on 70/30 20 units bid. Pt has meter at home with strips and lancets. Will go to [facility] for PCP to manage DM. Discussed hypoglycemia s/s and treatment. Pt voices understanding.  Thank you. Ailene Ards, RD, LDN, CDE Inpatient Diabetes Coordinator [phone]

## 2015-09-19 NOTE — Discharge Instructions (Signed)
Appoint for diabetic recheck and to obtain PCP Friday 24, 2017 at 9am

## 2015-09-19 NOTE — Discharge Summary (Signed)
Physician Discharge Summary  Stanley Edwards:[id number] DOB: 05-Feb-1966 DOA: 09/15/2015  PCP: No primary care provider on file.  Admit date: 09/15/2015 Discharge date: 09/19/2015  Time spent: 65 minutes  Recommendations for Outpatient Follow-up:  1. Patient is to [facility] with [facility]. On follow-up patient needs a basic metabolic profile done to follow-up on electrolytes and renal function. Patient need a CBC done to follow-up on his H&H. Patient will likely benefit from anemia workup. Patient may benefit from a referral for colonoscopy if he's not had one sees at age 66. Patient's diabetes will also need to be reassessed.   Discharge Diagnoses:  Principal Problem:   DKA (diabetic ketoacidoses) (HCC) Active Problems:   DM2 (diabetes mellitus, type 2) (HCC)   Hypokalemia   Anemia   Discharge Condition: Stable and improved.  Diet recommendation: Carb modified diet  Filed Weights   09/16/15 0200  Weight: 78.3 kg (172 lb 9.9 oz)    History of present illness:  Per Dr Belinda Block Stanley Edwards is a 50 y/o man with DM who presented to the [facility] ED with a history of increased thirst, increased urination and AMS. He was found to be hyperglycemic and severely acidotic with a pH of 7.0. He has used insulin before but stated that he didn't really need it. He is also taking metformin but has been trying to wean himself off it according to the ED notes  Hospital Course:  #1 diabetic ketoacidosis Patient was admitted under the critical care service in diabetic ketoacidosis with severe acidosis and placed in the ICU. Patient symptoms were felt to have been exacerbated secondary to noncompliance due to financial issues. Patient had been out of his medications for approximately a month prior to admission. On admission patient was noted to be severely acidotic with a pH of 7.07, PCO2 of 13 and a bicarbonate of 3.7. Chest x-ray done was unremarkable. Patient noted to have ketones in  his urine as well as glucose in the urine. EKG showed a sinus tachycardia. Patient noted to have an anion gap of greater than 28 with a blood sugar of 696 on admission. Patient had no signs of infection noted. Patient was hydrated aggressively with IV fluids place on the insulin glucose stabilizer placed on bowel rest and monitored. Patient improved clinically was subsequently transitioned to subcutaneous Lantus as well as sliding scale insulin once anion gap had closed acidosis had resolved and blood sugars better controlled. Patient was started on a diet which he tolerated. Patient was noted to have a hemoglobin A1c of 12.1. She was seen in consultation by the diabetic coordinator was recommended that patient be placed on 70/30 in addition to his metformin. Patient will be discharged home in stable and improved condition on 70/3020 units twice daily in addition to his metformin is to follow-up with PCP as outpatient. Patient be discharged in stable and improved condition.  #2 hypokalemia Likely secondary to fluid shifts. Patient's potassium will be repleted prior to discharge. Patient will follow-up with PCP as outpatient.  #3 anemia Patient noted to be anemic during the hospitalization. Patient with no overt bleeding. May be partially dilutional. Patient need to follow-up with PCP as outpatient for further evaluation and management. Age patient has not had a colonoscopy is likely due for a referral as patient is currently 50 years old.  Procedures:  CXR 09/18/2015  Consultations:  PCCM admission  Discharge Exam: Filed Vitals:   09/18/15 2105 09/19/15 0605  BP: 133/66 115/68  Pulse:  95 92  Temp: 98.6 F (37 C) 98 F (36.7 C)  Resp: 18 18    General: NAD Cardiovascular: RRR Respiratory: CTAB  Discharge Instructions   Discharge Instructions    Diet Carb Modified    Complete by:  As directed      Discharge instructions    Complete by:  As directed   Follow up as scheduled at  [facility].     Increase activity slowly    Complete by:  As directed           Current Discharge Medication List    START taking these medications   Details  insulin NPH-regular Human (NOVOLIN 70/30 RELION) (70-30) 100 UNIT/ML injection Inject 20 Units into the skin 2 (two) times daily with a meal. Qty: 10 mL, Refills: 4      CONTINUE these medications which have CHANGED   Details  metFORMIN (GLUCOPHAGE) 500 MG tablet Take 1 tablet (500 mg total) by mouth 2 (two) times daily with a meal. Qty: 60 tablet, Refills: 3      STOP taking these medications     Insulin Glargine (TOUJEO SOLOSTAR) 300 UNIT/ML SOPN      insulin lispro (HUMALOG KWIKPEN) 100 UNIT/ML KiwkPen        No Known Allergies Follow-up Information    Follow up with [facility]. Go in 2 days.   Why:  Friday [id number] at 9 am, Diabetic recheck and to obtain Pcp   Contact information:   [address] [facility] [address] [phone]       The results of significant diagnostics from this hospitalization (including imaging, microbiology, ancillary and laboratory) are listed below for reference.    Significant Diagnostic Studies: Dg Chest Port 1 View  09/18/2015  CLINICAL DATA:  DKA EXAM: PORTABLE CHEST 1 VIEW COMPARISON:  None. FINDINGS: The heart size and mediastinal contours are within normal limits. Both lungs are clear. The visualized skeletal structures are unremarkable. IMPRESSION: No active disease. Electronically Signed   By: Alcide Clever M.D.   On: 09/18/2015 09:28    Microbiology: Recent Results (from the past 240 hour(s))  MRSA PCR Screening     Status: None   Collection Time: 09/16/15  1:53 AM  Result Value Ref Range Status   MRSA by PCR NEGATIVE NEGATIVE Final    Comment:        The GeneXpert MRSA Assay (FDA approved for NASAL specimens only), is one component of a comprehensive MRSA colonization surveillance program. It is  not intended to diagnose MRSA infection nor to guide or monitor treatment for MRSA infections.      Labs: Basic Metabolic Panel:  Recent Labs Lab 09/16/15 1627 09/17/15 1031 09/17/15 1532 09/18/15 0557 09/19/15 0542 09/19/15 0755  NA 149* 142 146* 145 142  --   K 3.4* 3.7 3.7 3.4* 3.3*  --   CL 119* 111 114* 111 107  --   CO2 20* 20* 23 24 27   --   GLUCOSE 136* 445* 209* 255* 271*  --   BUN 32* 22* 22* 20 23*  --   CREATININE 1.40* 1.28* 1.19 1.01 1.05  --   CALCIUM 8.3* 8.4* 8.7* 8.4* 8.4*  --   MG  --   --   --   --   --  2.1   Liver Function Tests:  Recent Labs Lab 09/19/15 0542  AST 20  ALT 15*  ALKPHOS 74  BILITOT 1.7*  PROT  5.9*  ALBUMIN 3.0*   No results for input(s): LIPASE, AMYLASE in the last 168 hours. No results for input(s): AMMONIA in the last 168 hours. CBC:  Recent Labs Lab 09/15/15 2144 09/16/15 0402 09/18/15 0922 09/19/15 0542  WBC 15.5* 12.2* 5.9 4.0  NEUTROABS 13.9*  --  4.5  --   HGB 15.1 13.1 9.7* 8.2*  HCT 47.5 40.3 29.3* 24.8*  MCV 109.4* 106.3* 106.5* 105.1*  PLT 363 279 198 180   Cardiac Enzymes: No results for input(s): CKTOTAL, CKMB, CKMBINDEX, TROPONINI in the last 168 hours. BNP: BNP (last 3 results) No results for input(s): BNP in the last 8760 hours.  ProBNP (last 3 results) No results for input(s): PROBNP in the last 8760 hours.  CBG:  Recent Labs Lab 09/18/15 0734 09/18/15 1146 09/18/15 1655 09/18/15 2101 09/19/15 0719  GLUCAP 258* 311* 167* 235* 238*       Signed:  Margaretann Abate MD.  [facility] 09/19/2015, 10:45 AM

## 2015-09-19 NOTE — Progress Notes (Signed)
[facility] PROGRESS NOTE  IOAN LANDINI ZOX:[id number] DOB: 05-16-1966 DOA: 09/15/2015 PCP: No primary care provider on file.  Summary Stanley Edwards is a pleasant 50 y/o man with DM2 who presented to the [facility] ED with a history of increased thurst, increased urination and AMS. He was found to be in DKA ; hyperglycemic and severely acidotic with a pH of 7.0. He was using insulin at a sub optimal regimen as he ran out of one prescription months ago. He was managed by PCCM per DKA protocol and referred to hospitalist service once he was out of DKA. His hemoglobin A1C was found to be 12.1, therefore he will need insulin regimen for the near future. He is currently on Lantus/ssi, and dose can be optimized. There is no evidence of infection. Will continue to adjust Lantus to optimize control and consult diabetes coodinator for help with diabetes education and resources as possible. Hopefully home soon if continues to do well. Plan DKA (diabetic ketoacidoses) (HCC)/DM2 (diabetes mellitus, type 2) (HCC)  Out of DKA  Lantus/SSI  Diabetes coodinator  Code Status: Full Code Family Communication: self, no family members at bedside. Disposition Plan: Home soon, ?tomorrow   Consultants:  PCCM  Diabetes coordinator.  Procedures:    Antibiotics:  None  HPI/Subjective: Feels great, no complaints.  Objective: Filed Vitals:   09/18/15 1358 09/18/15 2105  BP: 118/65 133/66  Pulse: 83 95  Temp: 98.3 F (36.8 C) 98.6 F (37 C)  Resp: 18 18    Intake/Output Summary (Last 24 hours) at 09/19/15 0342 Last data filed at 09/18/15 2100  Gross per 24 hour  Intake   1080 ml  Output      0 ml  Net   1080 ml   Filed Weights   09/16/15 0200  Weight: 78.3 kg (172 lb 9.9 oz)    Exam:   General:  Comfortable at rest.  Cardiovascular: S1-S2 normal. No murmurs. Pulse regular.  Respiratory: Good air entry bilaterally. No rhonchi or rales.  Abdomen: Soft and nontender. Normal  bowel sounds. No organomegaly.  Musculoskeletal: No pedal edema   Neurological: Intact  Data Reviewed: Basic Metabolic Panel:  Recent Labs Lab 09/16/15 1208 09/16/15 1627 09/17/15 1031 09/17/15 1532 09/18/15 0557  NA 152* 149* 142 146* 145  K 3.7 3.4* 3.7 3.7 3.4*  CL 123* 119* 111 114* 111  CO2 19* 20* 20* 23 24  GLUCOSE 138* 136* 445* 209* 255*  BUN 35* 32* 22* 22* 20  CREATININE 1.49* 1.40* 1.28* 1.19 1.01  CALCIUM 8.5* 8.3* 8.4* 8.7* 8.4*   Liver Function Tests: No results for input(s): AST, ALT, ALKPHOS, BILITOT, PROT, ALBUMIN in the last 168 hours. No results for input(s): LIPASE, AMYLASE in the last 168 hours. No results for input(s): AMMONIA in the last 168 hours. CBC:  Recent Labs Lab 09/15/15 2144 09/16/15 0402 09/18/15 0922  WBC 15.5* 12.2* 5.9  NEUTROABS 13.9*  --  4.5  HGB 15.1 13.1 9.7*  HCT 47.5 40.3 29.3*  MCV 109.4* 106.3* 106.5*  PLT 363 279 198   Cardiac Enzymes: No results for input(s): CKTOTAL, CKMB, CKMBINDEX, TROPONINI in the last 168 hours. BNP (last 3 results) No results for input(s): BNP in the last 8760 hours.  ProBNP (last 3 results) No results for input(s): PROBNP in the last 8760 hours.  CBG:  Recent Labs Lab 09/17/15 2223 09/18/15 0734 09/18/15 1146 09/18/15 1655 09/18/15 2101  GLUCAP 155* 258* 311* 167* 235*    Recent Results (from the  past 240 hour(s))  MRSA PCR Screening     Status: None   Collection Time: 09/16/15  1:53 AM  Result Value Ref Range Status   MRSA by PCR NEGATIVE NEGATIVE Final    Comment:        The GeneXpert MRSA Assay (FDA approved for NASAL specimens only), is one component of a comprehensive MRSA colonization surveillance program. It is not intended to diagnose MRSA infection nor to guide or monitor treatment for MRSA infections.      Studies: Dg Chest Port 1 View  09/18/2015  CLINICAL DATA:  DKA EXAM: PORTABLE CHEST 1 VIEW COMPARISON:  None. FINDINGS: The heart size and mediastinal  contours are within normal limits. Both lungs are clear. The visualized skeletal structures are unremarkable. IMPRESSION: No active disease. Electronically Signed   By: Alcide Clever M.D.   On: 09/18/2015 09:28    Scheduled Meds: . heparin  5,000 Units Subcutaneous 3 times per day  . insulin aspart  0-15 Units Subcutaneous TID WC  . insulin aspart  0-5 Units Subcutaneous QHS  . insulin aspart  4 Units Subcutaneous TID WC  . insulin glargine  15 Units Subcutaneous Daily   Continuous Infusions:    Time spent: 25 minutes    Bob Eastwood  [facility] Pager [phone]. If 7PM-7AM, please contact night-coverage at [email or web address], password [facility] 09/19/2015, 3:42 AM  LOS: 3 days

## 2015-09-19 NOTE — Progress Notes (Signed)
Patient notified of discharge.  Educated patient on discharge medications, follow-up appointment, and discharge instructions.  Prescriptions given to patient.  Patient stated understanding and AVS signed.  Belongings gathered by patient.  IV removed.  Telemetry box 60 removed and returned, CCMD notified.  No questions or concerns at this time.  Patient is awaiting ride.

## 2015-09-19 NOTE — Progress Notes (Signed)
Date: September 19, 2015 Chart reviewed for concurrent status and case management needs. Will continue to follow patient for changes and needs:   Follow up appt made for [id number] at 9 am for patient and direction and asppt reminder given to patient. Marcelle Smiling, BSN, RN, [place]   [phone]

## 2015-09-21 ENCOUNTER — Encounter: Payer: Self-pay | Admitting: Family Medicine

## 2015-09-21 ENCOUNTER — Ambulatory Visit: Payer: Self-pay | Attending: Family Medicine | Admitting: Family Medicine

## 2015-09-21 VITALS — BP 110/70 | HR 89 | Temp 98.7°F | Resp 15 | Ht 76.0 in | Wt 190.6 lb

## 2015-09-21 DIAGNOSIS — Z794 Long term (current) use of insulin: Secondary | ICD-10-CM

## 2015-09-21 DIAGNOSIS — Z91148 Patient's other noncompliance with medication regimen for other reason: Secondary | ICD-10-CM

## 2015-09-21 DIAGNOSIS — D649 Anemia, unspecified: Secondary | ICD-10-CM

## 2015-09-21 DIAGNOSIS — E1165 Type 2 diabetes mellitus with hyperglycemia: Secondary | ICD-10-CM | POA: Insufficient documentation

## 2015-09-21 DIAGNOSIS — Z9114 Patient's other noncompliance with medication regimen: Secondary | ICD-10-CM | POA: Insufficient documentation

## 2015-09-21 DIAGNOSIS — Z7984 Long term (current) use of oral hypoglycemic drugs: Secondary | ICD-10-CM | POA: Insufficient documentation

## 2015-09-21 LAB — COMPLETE METABOLIC PANEL WITH GFR
ALT: 17 U/L (ref 9–46)
AST: 16 U/L (ref 10–35)
Albumin: 3.6 g/dL (ref 3.6–5.1)
Alkaline Phosphatase: 81 U/L (ref 40–115)
BUN: 17 mg/dL (ref 7–25)
CO2: 29 mmol/L (ref 20–31)
Calcium: 9.2 mg/dL (ref 8.6–10.3)
Chloride: 97 mmol/L — ABNORMAL LOW (ref 98–110)
Creat: 1.06 mg/dL (ref 0.70–1.33)
GFR, Est African American: >89 mL/min (ref >=60)
GFR, Est Non African American: 81 mL/min (ref >=60)
Glucose, Bld: 365 mg/dL — ABNORMAL HIGH (ref 65–99)
Potassium: 4.6 mmol/L (ref 3.5–5.3)
Sodium: 136 mmol/L (ref 135–146)
Total Bilirubin: 0.4 mg/dL (ref 0.2–1.2)
Total Protein: 6.4 g/dL (ref 6.1–8.1)

## 2015-09-21 LAB — LIPID PANEL
Cholesterol: 225 mg/dL — ABNORMAL HIGH (ref 125–200)
HDL: 48 mg/dL (ref >=40)
LDL Cholesterol: 150 mg/dL — ABNORMAL HIGH (ref <130)
Total CHOL/HDL Ratio: 4.7 Ratio (ref <=5.0)
Triglycerides: 136 mg/dL (ref <150)
VLDL: 27 mg/dL (ref <30)

## 2015-09-21 LAB — GLUCOSE, POCT (MANUAL RESULT ENTRY): POC Glucose: 362 mg/dl — AB (ref 70–99)

## 2015-09-21 MED ORDER — TRUE METRIX METER DEVI
1.0000 | Freq: Three times a day (TID) | Status: DC
Start: 2015-09-21 — End: 2019-03-24

## 2015-09-21 MED ORDER — GLUCOSE BLOOD VI STRP: ORAL_STRIP | Status: DC

## 2015-09-21 MED ORDER — TRUEPLUS LANCETS 28G MISC: 1.0000 | Freq: Three times a day (TID) | Status: DC

## 2015-09-21 MED FILL — NOVOLIN 70/30 100 UNITS/ML: (70-30) 100 | 25 days supply | Qty: 10 | Fill #0

## 2015-09-21 MED FILL — ?METFORMIN HCL 500MG TABLET: 500 | 30 days supply | Qty: 60 | Fill #0

## 2015-09-21 NOTE — Progress Notes (Signed)
Patient has been out of all medications for months He brought Rx from discharge for his DM meds that he needs to fill Patient refuses flu shot Patient has not eaten today

## 2015-09-21 NOTE — Progress Notes (Signed)
Subjective:  Patient ID: Stanley Edwards, male    DOB: 1966-02-01  Age: 50 y.o. MRN: [id number]  CC: Establish Care   HPI KAZ AULD he is a 50 year old male with a history of type 2 diabetes mellitus (A1c 12.1), anemia who was recently hospitalized at [facility] from 09/15/15-09/19/15 for diabetic ketoacidosis.  He had presented with increased thirst, increased urination and altered mental status with a glucose of greater than 600, severely acidotic with a pH of 7 and anion gap of 28. He was admitted to intensive care unit and placed on an insulin drip. He had admitted to trying to wean himself off metformin according to ED notes. He was also anemic with hemoglobin of 15.1 on admission but 8.2 on discharge; iron profile revealed mildly decreased TIBC at 237 (normal 250 - 450), iron was normal and iron saturation and ferritin was elevated at 73 and 1613 respectively. He received IV fluids and was subsequently transitioned to NovoLog 70/30 along with metformin and was subsequently discharged.  Today in the clinic his blood sugar is 362 and he admits to not picking up any of his medications yet as he was waiting to come in for his appointment today. He has no complaints today.  Outpatient Prescriptions Prior to Visit  Medication Sig Dispense Refill  . insulin NPH-regular Human (NOVOLIN 70/30 RELION) (70-30) 100 UNIT/ML injection Inject 20 Units into the skin 2 (two) times daily with a meal. (Patient not taking: Reported on 09/21/2015) 10 mL 4  . metFORMIN (GLUCOPHAGE) 500 MG tablet Take 1 tablet (500 mg total) by mouth 2 (two) times daily with a meal. (Patient not taking: Reported on 09/21/2015) 60 tablet 3   No facility-administered medications prior to visit.    ROS Review of Systems  Constitutional: Negative for activity change and appetite change.  HENT: Negative for sinus pressure and sore throat.   Eyes: Negative for visual disturbance.  Respiratory: Negative for cough, chest  tightness and shortness of breath.   Cardiovascular: Negative for chest pain and leg swelling.  Gastrointestinal: Negative for abdominal pain, diarrhea, constipation and abdominal distention.  Endocrine: Negative.   Genitourinary: Negative for dysuria.  Musculoskeletal: Negative for myalgias and joint swelling.  Skin: Negative for rash.  Allergic/Immunologic: Negative.   Neurological: Negative for weakness, light-headedness and numbness.  Psychiatric/Behavioral: Negative for suicidal ideas and dysphoric mood.    Objective:  BP 110/70 mmHg  Pulse 89  Temp(Src) 98.7 F (37.1 C)  Resp 15  Ht  (1.93 m)  Wt 190 lb 9.6 oz (86.456 kg)  BMI 23.21 kg/m2  SpO2 100%  BP/Weight 09/21/2015 09/19/2015 09/16/2015  Systolic BP 110 118 -  Diastolic BP 70 68 -  Wt. (Lbs) 190.6 - 172.62  BMI 23.21 - 21.58     Physical Exam  Constitutional: He is oriented to person, place, and time. He appears well-developed and well-nourished.  Cardiovascular: Normal rate, normal heart sounds and intact distal pulses.   No murmur heard. Pulmonary/Chest: Effort normal and breath sounds normal. He has no wheezes. He has no rales. He exhibits no tenderness.  Abdominal: Soft. Bowel sounds are normal. He exhibits no distension and no mass. There is no tenderness.  Musculoskeletal: Normal range of motion.  Neurological: He is alert and oriented to person, place, and time.    Lab Results  Component Value Date   HGBA1C 12.1* 09/18/2015    CMP Latest Ref Rng 09/19/2015 09/18/2015 09/17/2015  Glucose 65 - 99 mg/dL 409(W) 119(J) 478(G)  BUN 6 - 20 mg/dL 09(W) 20 11(B)  Creatinine 0.61 - 1.24 mg/dL 1.47 8.29 5.62  Sodium 135 - 145 mmol/L 142 145 146(H)  Potassium 3.5 - 5.1 mmol/L 3.3(L) 3.4(L) 3.7  Chloride 101 - 111 mmol/L 107 111 114(H)  CO2 22 - 32 mmol/L Calcium 8.9 - 10.3 mg/dL 1.3(Y) 8.6(V) 7.8(I)  Total Protein 6.5 - 8.1 g/dL 5.9(L) - -  Total Bilirubin 0.3 - 1.2 mg/dL 6.9(G) - -  Alkaline  Phos 38 - 126 U/L 74 - -  AST 15 - 41 U/L 20 - -  ALT 17 - 63 U/L 15(L) - -      Assessment & Plan:   1. Type 2 diabetes mellitus with hyperglycemia, with long-term current use of insulin (HCC) Uncontrolled with A1c of 12.1, CBG of 367 Advised to stop by the pharmacy to pick up his medications and I will review his blood sugar log at his next office visit Holding off on ACE inhibitor due to soft blood pressure Keep blood sugar logs with fasting goals of 80-120 mg/dl, random of less than 295 and in the event of sugars less than 60 mg/dl or greater than 284 mg/dl please notify the clinic ASAP. It is recommended that you undergo annual eye exams and annual foot exams. Pneumovax is recommended every 5 years before the age of 31 and once for a lifetime at or after the age of 14. - Glucose (CBG) - COMPLETE METABOLIC PANEL WITH GFR - Lipid panel - Microalbumin / creatinine urine ratio - TRUEPLUS LANCETS 28G MISC; 1 each by Does not apply route 3 (three) times daily before meals.  Dispense: 100 each; Refill: 12 - Blood Glucose Monitoring Suppl (TRUE METRIX METER) DEVI; 1 each by Does not apply route 3 (three) times daily before meals.  Dispense: 1 Device; Refill: 0 - glucose blood (TRUE METRIX BLOOD GLUCOSE TEST) test strip; Use 3 times daily before meals  Dispense: 100 each; Refill: 12  2. Anemia, unspecified anemia type Cannot exclude dilutional component given hemoglobin of 15.1 on admission and 8.2 on discharge We'll repeat it next office visit and determine if GI referral is needed. Advised to apply for the [facility] discount which will facilitate the referral process  3. Noncompliance with medications Compliance emphasized. 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 TRUEPLUS LANCETS 28G MISC    Sig: 1 each by Does not apply route 3 (three) times  daily before meals.    Dispense:  100 each    Refill:  12  . Blood Glucose Monitoring Suppl (TRUE METRIX METER) DEVI    Sig: 1 each by Does not apply route 3 (three) times daily before meals.    Dispense:  1 Device    Refill:  0  . glucose blood (TRUE METRIX BLOOD GLUCOSE TEST) test strip    Sig: Use 3 times daily before meals    Dispense:  100 each    Refill:  12    Follow-up: Return in about 2 weeks (around 10/05/2015) for Follow-up on diabetes mellitus.   Jaclyn Shaggy MD

## 2015-09-21 NOTE — Patient Instructions (Signed)
Diabetes Mellitus and Food It is important for you to manage your blood sugar (glucose) level. Your blood glucose level can be greatly affected by what you eat. Eating healthier foods in the appropriate amounts throughout the day at about the same time each day will help you control your blood glucose level. It can also help slow or prevent worsening of your diabetes mellitus. Healthy eating may even help you improve the level of your blood pressure and reach or maintain a healthy weight.  General recommendations for healthful eating and cooking habits include:  Eating meals and snacks regularly. Avoid going long periods of time without eating to lose weight.  Eating a diet that consists mainly of plant-based foods, such as fruits, vegetables, nuts, legumes, and whole grains.  Using low-heat cooking methods, such as baking, instead of high-heat cooking methods, such as deep frying. Work with your dietitian to make sure you understand how to use the Nutrition Facts information on food labels. HOW CAN FOOD AFFECT ME? Carbohydrates Carbohydrates affect your blood glucose level more than any other type of food. Your dietitian will help you determine how many carbohydrates to eat at each meal and teach you how to count carbohydrates. Counting carbohydrates is important to keep your blood glucose at a healthy level, especially if you are using insulin or taking certain medicines for diabetes mellitus. Alcohol Alcohol can cause sudden decreases in blood glucose (hypoglycemia), especially if you use insulin or take certain medicines for diabetes mellitus. Hypoglycemia can be a life-threatening condition. Symptoms of hypoglycemia (sleepiness, dizziness, and disorientation) are similar to symptoms of having too much alcohol.  If your health care provider has given you approval to drink alcohol, do so in moderation and use the following guidelines:  Women should not have more than one drink per day, and men  should not have more than two drinks per day. One drink is equal to:  12 oz of beer.  5 oz of wine.  1 oz of hard liquor.  Do not drink on an empty stomach.  Keep yourself hydrated. Have water, diet soda, or unsweetened iced tea.  Regular soda, juice, and other mixers might contain a lot of carbohydrates and should be counted. WHAT FOODS ARE NOT RECOMMENDED? As you make food choices, it is important to remember that all foods are not the same. Some foods have fewer nutrients per serving than other foods, even though they might have the same number of calories or carbohydrates. It is difficult to get your body what it needs when you eat foods with fewer nutrients. Examples of foods that you should avoid that are high in calories and carbohydrates but low in nutrients include:  Trans fats (most processed foods list trans fats on the Nutrition Facts label).  Regular soda.  Juice.  Candy.  Sweets, such as cake, pie, doughnuts, and cookies.  Fried foods. WHAT FOODS CAN I EAT? Eat nutrient-rich foods, which will nourish your body and keep you healthy. The food you should eat also will depend on several factors, including:  The calories you need.  The medicines you take.  Your weight.  Your blood glucose level.  Your blood pressure level.  Your cholesterol level. You should eat a variety of foods, including:  Protein.  Lean cuts of meat.  Proteins low in saturated fats, such as fish, egg whites, and beans. Avoid processed meats.  Fruits and vegetables.  Fruits and vegetables that may help control blood glucose levels, such as apples, mangoes, and   yams.  Dairy products.  Choose fat-free or low-fat dairy products, such as milk, yogurt, and cheese.  Grains, bread, pasta, and rice.  Choose whole grain products, such as multigrain bread, whole oats, and brown rice. These foods may help control blood pressure.  Fats.  Foods containing healthful fats, such as nuts,  avocado, olive oil, canola oil, and fish. DOES EVERYONE WITH DIABETES MELLITUS HAVE THE SAME MEAL PLAN? Because every person with diabetes mellitus is different, there is not one meal plan that works for everyone. It is very important that you meet with a dietitian who will help you create a meal plan that is just right for you.   This information is not intended to replace advice given to you by your health care provider. Make sure you discuss any questions you have with your health care provider.   Document Released: 04/10/2005 Document Revised: 08/04/2014 Document Reviewed: 06/10/2013 Elsevier Interactive Patient Education 2016 [employer] Inc[employer]  

## 2015-09-22 LAB — MICROALBUMIN / CREATININE URINE RATIO
Creatinine, Urine: 45 mg/dL (ref 20–370)
Microalb Creat Ratio: 13 mcg/mg creat (ref <30)
Microalb, Ur: 0.6 mg/dL

## 2015-09-24 ENCOUNTER — Telehealth: Payer: Self-pay | Admitting: *Deleted

## 2015-09-24 ENCOUNTER — Other Ambulatory Visit: Payer: Self-pay | Admitting: Family Medicine

## 2015-09-24 DIAGNOSIS — E1169 Type 2 diabetes mellitus with other specified complication: Secondary | ICD-10-CM | POA: Insufficient documentation

## 2015-09-24 DIAGNOSIS — E785 Hyperlipidemia, unspecified: Secondary | ICD-10-CM

## 2015-09-24 MED ORDER — ATORVASTATIN CALCIUM 20 MG PO TABS: 20.0000 mg | ORAL_TABLET | Freq: Every day | ORAL | Status: DC

## 2015-09-24 NOTE — Telephone Encounter (Signed)
-----   Message from Jaclyn Shaggy, MD sent at 09/24/2015  9:07 AM EST ----- Labs reveal elevated cholesterol for which I have placed him on atorvastatin

## 2015-09-24 NOTE — Telephone Encounter (Signed)
Left HIPPA complain message or patient to return call.  

## 2015-09-25 NOTE — Telephone Encounter (Signed)
Left HIPPA complain message or patient to return call.

## 2015-09-27 NOTE — Telephone Encounter (Signed)
Left HIPAA compliant message for third time.  Mailing letter to patient

## 2015-09-27 NOTE — Telephone Encounter (Signed)
-----   Message from [name] Amao, MD sent at 09/24/2015  9:07 AM EST ----- Labs reveal elevated cholesterol for which I have placed him on atorvastatin 

## 2015-10-05 ENCOUNTER — Ambulatory Visit: Payer: Self-pay | Attending: Family Medicine | Admitting: Family Medicine

## 2015-10-05 ENCOUNTER — Encounter: Payer: Self-pay | Admitting: Family Medicine

## 2015-10-05 VITALS — BP 99/66 | HR 98 | Temp 98.3°F | Resp 15 | Ht 76.0 in | Wt 209.0 lb

## 2015-10-05 DIAGNOSIS — Z6825 Body mass index (BMI) 25.0-25.9, adult: Secondary | ICD-10-CM | POA: Insufficient documentation

## 2015-10-05 DIAGNOSIS — D649 Anemia, unspecified: Secondary | ICD-10-CM | POA: Insufficient documentation

## 2015-10-05 DIAGNOSIS — R635 Abnormal weight gain: Secondary | ICD-10-CM | POA: Insufficient documentation

## 2015-10-05 DIAGNOSIS — Z794 Long term (current) use of insulin: Secondary | ICD-10-CM | POA: Insufficient documentation

## 2015-10-05 DIAGNOSIS — E1165 Type 2 diabetes mellitus with hyperglycemia: Secondary | ICD-10-CM | POA: Insufficient documentation

## 2015-10-05 DIAGNOSIS — Z79899 Other long term (current) drug therapy: Secondary | ICD-10-CM | POA: Insufficient documentation

## 2015-10-05 DIAGNOSIS — R600 Localized edema: Secondary | ICD-10-CM

## 2015-10-05 LAB — CBC WITH DIFFERENTIAL/PLATELET
Basophils Absolute: 0 10*3/uL (ref 0.0–0.1)
Basophils Relative: 1 % (ref 0–1)
Eosinophils Absolute: 0.1 10*3/uL (ref 0.0–0.7)
Eosinophils Relative: 2 % (ref 0–5)
HCT: 31.9 % — ABNORMAL LOW (ref 39.0–52.0)
Hemoglobin: 10.5 g/dL — ABNORMAL LOW (ref 13.0–17.0)
Lymphocytes Relative: 26 % (ref 12–46)
Lymphs Abs: 1 10*3/uL (ref 0.7–4.0)
MCH: 35.4 pg — ABNORMAL HIGH (ref 26.0–34.0)
MCHC: 32.9 g/dL (ref 30.0–36.0)
MCV: 107.4 fL — ABNORMAL HIGH (ref 78.0–100.0)
MPV: 9.9 fL (ref 8.6–12.4)
Monocytes Absolute: 0.3 10*3/uL (ref 0.1–1.0)
Monocytes Relative: 8 % (ref 3–12)
Neutro Abs: 2.3 10*3/uL (ref 1.7–7.7)
Neutrophils Relative %: 63 % (ref 43–77)
Platelets: 285 10*3/uL (ref 150–400)
RBC: 2.97 MIL/uL — ABNORMAL LOW (ref 4.22–5.81)
RDW: 13.4 % (ref 11.5–15.5)
WBC: 3.7 10*3/uL — ABNORMAL LOW (ref 4.0–10.5)

## 2015-10-05 LAB — GLUCOSE, POCT (MANUAL RESULT ENTRY): POC Glucose: 189 mg/dl — AB (ref 70–99)

## 2015-10-05 MED ORDER — FUROSEMIDE 20 MG PO TABS: 20.0000 mg | ORAL_TABLET | Freq: Every day | ORAL | Status: DC

## 2015-10-05 MED ORDER — METFORMIN HCL 500 MG PO TABS: 1000.0000 mg | ORAL_TABLET | Freq: Two times a day (BID) | ORAL | Status: DC

## 2015-10-05 MED ORDER — GLIPIZIDE 5 MG PO TABS: 5.0000 mg | ORAL_TABLET | Freq: Two times a day (BID) | ORAL | Status: DC

## 2015-10-05 MED ORDER — INSULIN PEN NEEDLE 31G X 5 MM MISC: 1.0000 | Freq: Every day | Status: DC

## 2015-10-05 MED ORDER — INSULIN DETEMIR 100 UNIT/ML FLEXPEN: 20.0000 [IU] | PEN_INJECTOR | Freq: Every day | SUBCUTANEOUS | Status: DC

## 2015-10-05 MED FILL — ULTICARE PEN NDL 4MM 32G: 32G X 4 MM | 25 days supply | Qty: 100 | Fill #0

## 2015-10-05 MED FILL — TRUE METRIX TEST STRIP: 30 days supply | Qty: 100 | Fill #0

## 2015-10-05 MED FILL — !LEVEMIR FLEXPEN 100UNITS/M: 100U/ML (3) | 30 days supply | Qty: 6 | Fill #0

## 2015-10-05 MED FILL — TRUEplus LANCETS 28G MISC: 30 days supply | Qty: 100 | Fill #0

## 2015-10-05 MED FILL — ATORVASTATIN 20 MG TABLET: 20 | 30 days supply | Qty: 30 | Fill #0

## 2015-10-05 MED FILL — ?FUROSEMIDE 20 MG TABLET: 20 | 5 days supply | Qty: 5 | Fill #0

## 2015-10-05 MED FILL — ?METFORMIN HCL 500MG TABLET: 500 | 30 days supply | Qty: 120 | Fill #0

## 2015-10-05 MED FILL — TRUE METRIX BLOOD GLUCOSE M: W/DEVICE | 1 days supply | Qty: 1 | Fill #0

## 2015-10-05 MED FILL — glipiZIDE 5 MG TABS: 5 | 30 days supply | Qty: 60 | Fill #0

## 2015-10-05 NOTE — Progress Notes (Signed)
Subjective:  Patient ID: Stanley Edwards, male    DOB: July 28, 1966  Age: 50 y.o. MRN: [id number]  CC: Diabetes   HPI Stanley Edwards  is a 50 year old male with a history of type 2 diabetes mellitus (A1c 12.1), anemia with recent hospitalization for diabetic ketoacidosis who comes into the clinic for a follow-up visit.  He does not have his blood sugar log with him but states his fasting sugars have been in the 110 range. 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 He is requesting a prescription for insulin pens.  He has gained 19 pounds in the last 2 weeks (he weighed 190 pounds on 09/21/15 and today weighs 219 pounds); he informs me his usual weight is around 215 pounds. He has also noticed pedal edema since his last office visit but denies shortness of breath, reduced exercise tolerance or chest pain.  Outpatient Prescriptions Prior to Visit  Medication Sig Dispense Refill  . Blood Glucose Monitoring Suppl (TRUE METRIX METER) DEVI 1 each by Does not apply route 3 (three) times daily before meals. 1 Device 0  . glucose blood (TRUE METRIX BLOOD GLUCOSE TEST) test strip Use 3 times daily before meals 100 each 12  . TRUEPLUS LANCETS 28G MISC 1 each by Does not apply route 3 (three) times daily before meals. 100 each 12  . atorvastatin (LIPITOR) 20 MG tablet Take 1 tablet (20 mg total) by mouth daily. (Patient not taking: Reported on 10/05/2015) 30 tablet 2  . insulin NPH-regular Human (NOVOLIN 70/30 RELION) (70-30) 100 UNIT/ML injection Inject 20 Units into the skin 2 (two) times daily with a meal. (Patient not taking: Reported on 09/21/2015) 10 mL 4  . metFORMIN (GLUCOPHAGE) 500 MG tablet Take 1 tablet (500 mg total) by mouth 2 (two) times daily with a meal. (Patient not taking: Reported on 09/21/2015) 60 tablet 3   No facility-administered medications prior to visit.    ROS Review of Systems    Constitutional: Positive for unexpected weight change. Negative for activity change and appetite change.  HENT: Negative for sinus pressure and sore throat.   Eyes: Negative for visual disturbance.  Respiratory: Negative for cough, chest tightness, shortness of breath and wheezing.   Cardiovascular: Positive for leg swelling. Negative for chest pain and palpitations.  Gastrointestinal: Negative for abdominal pain, diarrhea, constipation and abdominal distention.  Endocrine: Negative.   Genitourinary: Negative.  Negative for dysuria.  Musculoskeletal: Negative.  Negative for myalgias and joint swelling.  Skin: Negative for rash.  Allergic/Immunologic: Negative.   Neurological: Negative for weakness, light-headedness and numbness.  Psychiatric/Behavioral: Negative for suicidal ideas, behavioral problems and dysphoric mood.    Objective:  BP 99/66 mmHg  Pulse 98  Temp(Src) 98.3 F (36.8 C)  Resp 15  Ht 6' 4" (1.93 m)  Wt 209 lb (94.802 kg)  BMI 25.45 kg/m2  SpO2 97%  BP/Weight 10/05/2015 09/21/2015 03/28/5175  Systolic BP 99 160 737  Diastolic BP 66 70 68  Wt. (Lbs) 209 190.6 -  BMI 25.45 23.21 -      Physical Exam  Constitutional: He is oriented to person, place, and time. He appears well-developed and well-nourished.  Cardiovascular: Normal rate, normal heart sounds and intact distal pulses.   No murmur heard. Pulmonary/Chest: Effort normal and breath sounds normal. He has no wheezes. He has no rales. He exhibits no tenderness.  Abdominal: Soft. Bowel sounds are  normal. He exhibits no distension and no mass. There is no tenderness.  Musculoskeletal: Normal range of motion. He exhibits edema (2+ nonpitting dorsal edema).  Neurological: He is alert and oriented to person, place, and time.    Lab Results  Component Value Date   HGBA1C 12.1* 09/18/2015    CMP Latest Ref Rng 09/21/2015 09/19/2015 09/18/2015  Glucose 65 - 99 mg/dL 365(H) 271(H) 255(H)  BUN 7 - 25 mg/dL 17  23(H) 20  Creatinine 0.70 - 1.33 mg/dL 1.06 1.05 1.01  Sodium 135 - 146 mmol/L 136 142 145  Potassium 3.5 - 5.3 mmol/L 4.6 3.3(L) 3.4(L)  Chloride 98 - 110 mmol/L 97(L) 107 111  CO2 20 - 31 mmol/L _0 Calcium 8.6 - 10.3 mg/dL 9.2 8.4(L) 8.4(L)  Total Protein 6.1 - 8.1 g/dL 6.4 5.9(L) -  Total Bilirubin 0.2 - 1.2 mg/dL 0.4 1.7(H) -  Alkaline Phos 40 - 115 U/L 81 74 -  AST 10 - 35 U/L 16 20 -  ALT 9 - 46 U/L 17 15(L) -    CBC    Component Value Date/Time   WBC 4.0 09/19/2015 0542   RBC 2.61* 09/19/2015 0755   RBC 2.36* 09/19/2015 0542   HGB 8.2* 09/19/2015 0542   HCT 24.8* 09/19/2015 0542   PLT 180 09/19/2015 0542   MCV 105.1* 09/19/2015 0542   MCH 34.7* 09/19/2015 0542   MCHC 33.1 09/19/2015 0542   RDW 12.1 09/19/2015 0542   LYMPHSABS 1.1 09/18/2015 0922   MONOABS 0.2 09/18/2015 0922   EOSABS 0.1 09/18/2015 0922   BASOSABS 0.0 09/18/2015 0922      Assessment & Plan:   1. Type 2 diabetes mellitus with hyperglycemia, with long-term current use of insulin (HCC) Uncontrolled with A1c of 12.1 Based on current blood sugar log and CBG there seems to be some improvement. We'll increase dose of metformin, add glipizide to regimen and switch to Levemir 20 units at bedtime with the goal of working towards reducing insulin dose to barest minimum. The patient has been educated on hypoglycemia and no stool cut back Levemir to 15 units if that occurs. - Blood Glucose Monitoring Suppl (TRUE METRIX METER) w/Device KIT; ; Refill: 0 - Glucose (CBG) - metFORMIN (GLUCOPHAGE) 500 MG tablet; Take 2 tablets (1,000 mg total) by mouth 2 (two) times daily with a meal.  Dispense: 120 tablet; Refill: 3 - glipiZIDE (GLUCOTROL) 5 MG tablet; Take 1 tablet (5 mg total) by mouth 2 (two) times daily before a meal. (Patient not taking: Reported on 10/05/2015)  Dispense: 60 tablet; Refill: 3 - Insulin Detemir (LEVEMIR FLEXTOUCH) 100 UNIT/ML Pen; Inject 20 Units into the skin daily at 10 pm. (Patient not  taking: Reported on 10/05/2015)  Dispense: 15 mL; Refill: 11 - Insulin Pen Needle 31G X 5 MM MISC; 1 each by Does not apply route daily. (Patient not taking: Reported on 10/05/2015)  Dispense: 30 each; Refill: 11  2. Anemia, unspecified anemia type Repeat CBC today as he had a hemoglobin of 8.2 previously - CBC with Differential  3. Pedal edema Unknown etiology-no history of heart failure, no evidence of microalbuminuria Advised to elevate feet. We'll place on Lasix for 5 days due to soft blood pressure If symptoms persist he will need a 2-D echo.  4. Weight gain This could also be secondary to use of insulin We'll reassess at next office visit Discussed return precautions   Meds ordered this encounter  Medications  . metFORMIN (GLUCOPHAGE) 500 MG tablet  Sig: Take 2 tablets (1,000 mg total) by mouth 2 (two) times daily with a meal.    Dispense:  120 tablet    Refill:  3    Discontinue previous dosing  . glipiZIDE (GLUCOTROL) 5 MG tablet    Sig: Take 1 tablet (5 mg total) by mouth 2 (two) times daily before a meal.    Dispense:  60 tablet    Refill:  3  . Insulin Detemir (LEVEMIR FLEXTOUCH) 100 UNIT/ML Pen    Sig: Inject 20 Units into the skin daily at 10 pm.    Dispense:  15 mL    Refill:  11  . furosemide (LASIX) 20 MG tablet    Sig: Take 1 tablet (20 mg total) by mouth daily.    Dispense:  5 tablet    Refill:  0  . Insulin Pen Needle 31G X 5 MM MISC    Sig: 1 each by Does not apply route daily.    Dispense:  30 each    Refill:  11    Follow-up: Return in about 1 month (around 11/05/2015) for Follow-up on diabetes mellitus.   Arnoldo Morale MD

## 2015-10-05 NOTE — Patient Instructions (Signed)
Diabetes Mellitus and Food It is important for you to manage your blood sugar (glucose) level. Your blood glucose level can be greatly affected by what you eat. Eating healthier foods in the appropriate amounts throughout the day at about the same time each day will help you control your blood glucose level. It can also help slow or prevent worsening of your diabetes mellitus. Healthy eating may even help you improve the level of your blood pressure and reach or maintain a healthy weight.  General recommendations for healthful eating and cooking habits include:  Eating meals and snacks regularly. Avoid going long periods of time without eating to lose weight.  Eating a diet that consists mainly of plant-based foods, such as fruits, vegetables, nuts, legumes, and whole grains.  Using low-heat cooking methods, such as baking, instead of high-heat cooking methods, such as deep frying. Work with your dietitian to make sure you understand how to use the Nutrition Facts information on food labels. HOW CAN FOOD AFFECT ME? Carbohydrates Carbohydrates affect your blood glucose level more than any other type of food. Your dietitian will help you determine how many carbohydrates to eat at each meal and teach you how to count carbohydrates. Counting carbohydrates is important to keep your blood glucose at a healthy level, especially if you are using insulin or taking certain medicines for diabetes mellitus. Alcohol Alcohol can cause sudden decreases in blood glucose (hypoglycemia), especially if you use insulin or take certain medicines for diabetes mellitus. Hypoglycemia can be a life-threatening condition. Symptoms of hypoglycemia (sleepiness, dizziness, and disorientation) are similar to symptoms of having too much alcohol.  If your health care provider has given you approval to drink alcohol, do so in moderation and use the following guidelines:  Women should not have more than one drink per day, and men  should not have more than two drinks per day. One drink is equal to:  12 oz of beer.  5 oz of wine.  1 oz of hard liquor.  Do not drink on an empty stomach.  Keep yourself hydrated. Have water, diet soda, or unsweetened iced tea.  Regular soda, juice, and other mixers might contain a lot of carbohydrates and should be counted. WHAT FOODS ARE NOT RECOMMENDED? As you make food choices, it is important to remember that all foods are not the same. Some foods have fewer nutrients per serving than other foods, even though they might have the same number of calories or carbohydrates. It is difficult to get your body what it needs when you eat foods with fewer nutrients. Examples of foods that you should avoid that are high in calories and carbohydrates but low in nutrients include:  Trans fats (most processed foods list trans fats on the Nutrition Facts label).  Regular soda.  Juice.  Candy.  Sweets, such as cake, pie, doughnuts, and cookies.  Fried foods. WHAT FOODS CAN I EAT? Eat nutrient-rich foods, which will nourish your body and keep you healthy. The food you should eat also will depend on several factors, including:  The calories you need.  The medicines you take.  Your weight.  Your blood glucose level.  Your blood pressure level.  Your cholesterol level. You should eat a variety of foods, including:  Protein.  Lean cuts of meat.  Proteins low in saturated fats, such as fish, egg whites, and beans. Avoid processed meats.  Fruits and vegetables.  Fruits and vegetables that may help control blood glucose levels, such as apples, mangoes, and   yams.  Dairy products.  Choose fat-free or low-fat dairy products, such as milk, yogurt, and cheese.  Grains, bread, pasta, and rice.  Choose whole grain products, such as multigrain bread, whole oats, and brown rice. These foods may help control blood pressure.  Fats.  Foods containing healthful fats, such as nuts,  avocado, olive oil, canola oil, and fish. DOES EVERYONE WITH DIABETES MELLITUS HAVE THE SAME MEAL PLAN? Because every person with diabetes mellitus is different, there is not one meal plan that works for everyone. It is very important that you meet with a dietitian who will help you create a meal plan that is just right for you.   This information is not intended to replace advice given to you by your health care provider. Make sure you discuss any questions you have with your health care provider.   Document Released: 04/10/2005 Document Revised: 08/04/2014 Document Reviewed: 06/10/2013 Elsevier Interactive Patient Education 2016 [employer] Inc[employer]  

## 2015-10-05 NOTE — Progress Notes (Signed)
Follow up for DM2 Needs needles

## 2015-10-08 ENCOUNTER — Telehealth: Payer: Self-pay | Admitting: *Deleted

## 2015-10-08 NOTE — Telephone Encounter (Signed)
Left HIPAA compliant voicemail for patient to return call.

## 2015-10-09 NOTE — Telephone Encounter (Signed)
Left second HIPAA compliant voicemail for patient to return call to clinic

## 2015-10-11 ENCOUNTER — Encounter: Payer: Self-pay | Admitting: *Deleted

## 2015-10-11 NOTE — Telephone Encounter (Signed)
3rd message left.  Letter mailed to patient.

## 2015-10-16 NOTE — Telephone Encounter (Signed)
Error

## 2016-03-24 ENCOUNTER — Inpatient Hospital Stay (HOSPITAL_COMMUNITY)
Admission: EM | Admit: 2016-03-24 | Discharge: 2016-03-26 | DRG: 638 | Disposition: A | Discharge status: Home / Self Care | Payer: Self-pay | Attending: Family Medicine | Admitting: Family Medicine

## 2016-03-24 ENCOUNTER — Emergency Department (HOSPITAL_COMMUNITY): Payer: Self-pay

## 2016-03-24 ENCOUNTER — Encounter (HOSPITAL_COMMUNITY): Payer: Self-pay

## 2016-03-24 DIAGNOSIS — E785 Hyperlipidemia, unspecified: Secondary | ICD-10-CM | POA: Diagnosis present

## 2016-03-24 DIAGNOSIS — E1310 Other specified diabetes mellitus with ketoacidosis without coma: Principal | ICD-10-CM | POA: Diagnosis present

## 2016-03-24 DIAGNOSIS — N179 Acute kidney failure, unspecified: Secondary | ICD-10-CM | POA: Diagnosis present

## 2016-03-24 DIAGNOSIS — E0810 Diabetes mellitus due to underlying condition with ketoacidosis without coma: Secondary | ICD-10-CM

## 2016-03-24 DIAGNOSIS — E860 Dehydration: Secondary | ICD-10-CM | POA: Diagnosis present

## 2016-03-24 DIAGNOSIS — Z9119 Patient's noncompliance with other medical treatment and regimen: Secondary | ICD-10-CM

## 2016-03-24 DIAGNOSIS — Z91148 Patient's other noncompliance with medication regimen for other reason: Secondary | ICD-10-CM

## 2016-03-24 DIAGNOSIS — Z794 Long term (current) use of insulin: Secondary | ICD-10-CM

## 2016-03-24 DIAGNOSIS — E1110 Type 2 diabetes mellitus with ketoacidosis without coma: Secondary | ICD-10-CM | POA: Diagnosis present

## 2016-03-24 DIAGNOSIS — E1169 Type 2 diabetes mellitus with other specified complication: Secondary | ICD-10-CM | POA: Diagnosis present

## 2016-03-24 DIAGNOSIS — E119 Type 2 diabetes mellitus without complications: Secondary | ICD-10-CM

## 2016-03-24 DIAGNOSIS — Z9114 Patient's other noncompliance with medication regimen: Secondary | ICD-10-CM

## 2016-03-24 DIAGNOSIS — Z91199 Patient's noncompliance with other medical treatment and regimen due to unspecified reason: Secondary | ICD-10-CM

## 2016-03-24 LAB — GLUCOSE, CAPILLARY
Glucose-Capillary: 332 mg/dL — ABNORMAL HIGH (ref 65–99)
Glucose-Capillary: 245 mg/dL — ABNORMAL HIGH (ref 65–99)
Glucose-Capillary: 139 mg/dL — ABNORMAL HIGH (ref 65–99)
Glucose-Capillary: 226 mg/dL — ABNORMAL HIGH (ref 65–99)
Glucose-Capillary: 223 mg/dL — ABNORMAL HIGH (ref 65–99)
Glucose-Capillary: 203 mg/dL — ABNORMAL HIGH (ref 65–99)
Glucose-Capillary: 162 mg/dL — ABNORMAL HIGH (ref 65–99)
Glucose-Capillary: 160 mg/dL — ABNORMAL HIGH (ref 65–99)

## 2016-03-24 LAB — BASIC METABOLIC PANEL
Anion gap: 24 — ABNORMAL HIGH (ref 5–15)
Anion gap: 26 — ABNORMAL HIGH (ref 5–15)
Anion gap: 16 — ABNORMAL HIGH (ref 5–15)
Anion gap: 12 (ref 5–15)
BUN: 32 mg/dL — ABNORMAL HIGH (ref 6–20)
BUN: 35 mg/dL — ABNORMAL HIGH (ref 6–20)
BUN: 31 mg/dL — ABNORMAL HIGH (ref 6–20)
BUN: 27 mg/dL — ABNORMAL HIGH (ref 6–20)
CO2: 14 mmol/L — ABNORMAL LOW (ref 22–32)
CO2: 15 mmol/L — ABNORMAL LOW (ref 22–32)
CO2: 18 mmol/L — ABNORMAL LOW (ref 22–32)
CO2: 20 mmol/L — ABNORMAL LOW (ref 22–32)
Calcium: 10.1 mg/dL (ref 8.9–10.3)
Calcium: 9.9 mg/dL (ref 8.9–10.3)
Calcium: 8.8 mg/dL — ABNORMAL LOW (ref 8.9–10.3)
Calcium: 8.8 mg/dL — ABNORMAL LOW (ref 8.9–10.3)
Chloride: 103 mmol/L (ref 101–111)
Chloride: 100 mmol/L — ABNORMAL LOW (ref 101–111)
Chloride: 106 mmol/L (ref 101–111)
Chloride: 109 mmol/L (ref 101–111)
Creatinine, Ser: 1.83 mg/dL — ABNORMAL HIGH (ref 0.61–1.24)
Creatinine, Ser: 1.99 mg/dL — ABNORMAL HIGH (ref 0.61–1.24)
Creatinine, Ser: 1.74 mg/dL — ABNORMAL HIGH (ref 0.61–1.24)
Creatinine, Ser: 1.44 mg/dL — ABNORMAL HIGH (ref 0.61–1.24)
GFR calc Af Amer: 48 mL/min — ABNORMAL LOW (ref >60)
GFR calc Af Amer: 43 mL/min — ABNORMAL LOW (ref >60)
GFR calc Af Amer: 51 mL/min — ABNORMAL LOW (ref >60)
GFR calc Af Amer: >60 mL/min (ref >60)
GFR calc non Af Amer: 41 mL/min — ABNORMAL LOW (ref >60)
GFR calc non Af Amer: 37 mL/min — ABNORMAL LOW (ref >60)
GFR calc non Af Amer: 44 mL/min — ABNORMAL LOW (ref >60)
GFR calc non Af Amer: 55 mL/min — ABNORMAL LOW (ref >60)
Glucose, Bld: 388 mg/dL — ABNORMAL HIGH (ref 65–99)
Glucose, Bld: 417 mg/dL — ABNORMAL HIGH (ref 65–99)
Glucose, Bld: 221 mg/dL — ABNORMAL HIGH (ref 65–99)
Glucose, Bld: 194 mg/dL — ABNORMAL HIGH (ref 65–99)
Potassium: 4.4 mmol/L (ref 3.5–5.1)
Potassium: 4.8 mmol/L (ref 3.5–5.1)
Potassium: 4.6 mmol/L (ref 3.5–5.1)
Potassium: 4 mmol/L (ref 3.5–5.1)
Sodium: 141 mmol/L (ref 135–145)
Sodium: 141 mmol/L (ref 135–145)
Sodium: 140 mmol/L (ref 135–145)
Sodium: 141 mmol/L (ref 135–145)

## 2016-03-24 LAB — URINE MICROSCOPIC-ADD ON
Bacteria, UA: NONE SEEN
RBC / HPF: NONE SEEN RBC/hpf (ref 0–5)
WBC, UA: NONE SEEN WBC/hpf (ref 0–5)

## 2016-03-24 LAB — CBC
HCT: 48.1 % (ref 39.0–52.0)
Hemoglobin: 16 g/dL (ref 13.0–17.0)
MCH: 34.2 pg — ABNORMAL HIGH (ref 26.0–34.0)
MCHC: 33.3 g/dL (ref 30.0–36.0)
MCV: 102.8 fL — ABNORMAL HIGH (ref 78.0–100.0)
Platelets: 362 10*3/uL (ref 150–400)
RBC: 4.68 MIL/uL (ref 4.22–5.81)
RDW: 12 % (ref 11.5–15.5)
WBC: 6.7 10*3/uL (ref 4.0–10.5)

## 2016-03-24 LAB — URINALYSIS, ROUTINE W REFLEX MICROSCOPIC
Glucose, UA: >1000 mg/dL — AB
Hgb urine dipstick: NEGATIVE
Ketones, ur: >80 mg/dL — AB
Leukocytes, UA: NEGATIVE
Nitrite: NEGATIVE
Protein, ur: NEGATIVE mg/dL
Specific Gravity, Urine: 1.032 — ABNORMAL HIGH (ref 1.005–1.030)
pH: 5 (ref 5.0–8.0)

## 2016-03-24 LAB — MRSA PCR SCREENING: MRSA by PCR: NEGATIVE

## 2016-03-24 LAB — I-STAT TROPONIN, ED: Troponin i, poc: 0 ng/mL (ref 0.00–0.08)

## 2016-03-24 LAB — CBG MONITORING, ED
Glucose-Capillary: 367 mg/dL — ABNORMAL HIGH (ref 65–99)
Glucose-Capillary: 356 mg/dL — ABNORMAL HIGH (ref 65–99)

## 2016-03-24 LAB — LIPASE, BLOOD: Lipase: 23 U/L (ref 11–51)

## 2016-03-24 MED ORDER — DEXTROSE-NACL 5-0.45 % IV SOLN
INTRAVENOUS | Status: DC
  Administered 2016-03-24: 16:00:00 via INTRAVENOUS

## 2016-03-24 MED ORDER — ATORVASTATIN CALCIUM 10 MG PO TABS
20.0000 mg | ORAL_TABLET | Freq: Every day | ORAL | Status: DC
  Administered 2016-03-25 – 2016-03-26 (×2): 20 mg via ORAL
  Filled 2016-03-24 (×2): qty 2

## 2016-03-24 MED ORDER — SODIUM CHLORIDE 0.9 % IV BOLUS (SEPSIS)
1000.0000 mL | Freq: Once | INTRAVENOUS | Status: AC
  Administered 2016-03-24: 1000 mL via INTRAVENOUS

## 2016-03-24 MED ORDER — SODIUM CHLORIDE 0.9 % IV BOLUS (SEPSIS): 1000.0000 mL | Freq: Once | INTRAVENOUS | Status: DC

## 2016-03-24 MED ORDER — SODIUM CHLORIDE 0.9 % IV SOLN
INTRAVENOUS | Status: DC
  Administered 2016-03-24: 14:00:00 via INTRAVENOUS

## 2016-03-24 MED ORDER — CHLORHEXIDINE GLUCONATE 0.12 % MT SOLN
15.0000 mL | Freq: Two times a day (BID) | OROMUCOSAL | Status: DC
  Administered 2016-03-25 – 2016-03-26 (×2): 15 mL via OROMUCOSAL
  Filled 2016-03-24 (×2): qty 15

## 2016-03-24 MED ORDER — DEXTROSE-NACL 5-0.45 % IV SOLN: INTRAVENOUS | Status: DC

## 2016-03-24 MED ORDER — SODIUM CHLORIDE 0.9 % IV SOLN
INTRAVENOUS | Status: DC
  Administered 2016-03-24: 3 [IU]/h via INTRAVENOUS
  Filled 2016-03-24: qty 2.5

## 2016-03-24 MED ORDER — INSULIN DETEMIR 100 UNIT/ML ~~LOC~~ SOLN
20.0000 [IU] | Freq: Every day | SUBCUTANEOUS | Status: DC
  Administered 2016-03-24 – 2016-03-26 (×3): 20 [IU] via SUBCUTANEOUS
  Filled 2016-03-24 (×4): qty 0.2

## 2016-03-24 MED ORDER — POTASSIUM CHLORIDE 10 MEQ/100ML IV SOLN
10.0000 meq | INTRAVENOUS | Status: AC
  Administered 2016-03-24 (×2): 10 meq via INTRAVENOUS
  Filled 2016-03-24 (×3): qty 100

## 2016-03-24 MED ORDER — SODIUM CHLORIDE 0.9 % IV SOLN: INTRAVENOUS | Status: DC

## 2016-03-24 MED ORDER — ORAL CARE MOUTH RINSE
15.0000 mL | Freq: Two times a day (BID) | OROMUCOSAL | Status: DC
  Administered 2016-03-24 – 2016-03-26 (×3): 15 mL via OROMUCOSAL

## 2016-03-24 MED ORDER — ENOXAPARIN SODIUM 40 MG/0.4ML ~~LOC~~ SOLN
40.0000 mg | SUBCUTANEOUS | Status: DC
  Administered 2016-03-24 – 2016-03-25 (×2): 40 mg via SUBCUTANEOUS
  Filled 2016-03-24 (×2): qty 0.4

## 2016-03-24 NOTE — ED Provider Notes (Signed)
Patient complains of polyuria generalized malaise and fatigue for the past 5 weeks. Also complains of nausea and vomiting. He is not nauseated presently. Denies pain anywhere. He's been noncompliant with insulin and metformin for several months. On exam no distress. Patient in diabetic ketoacidosis likely secondary to noncompliance with medications   Doug SouSam Keyairra Kolinski, MD 03/24/16 1255

## 2016-03-24 NOTE — ED Triage Notes (Signed)
Per EMS, Pt, from home, c/o generalized weakness, polyuria, increases thirst, and lack of appetite r/t hyperglycemia x 4 weeks.  Denies pain.  Pt reports that he ran out of DM medications x 2 months ago.

## 2016-03-24 NOTE — ED Notes (Addendum)
Patient refuses to put on gown, sit on stretcher, or be hooked up to cardiac monitor until he "washes up". Patient also refusing IV at this time. Provided patient with wash cloth and towel so that he could clean up. Will continue to monitor.

## 2016-03-24 NOTE — Progress Notes (Addendum)
ED CM noted CM consult for medication needs Spoke with pt after review of chart notes to find [facility] - Dr [facility]ia [facility] with in house pharmacy that assists with discounted medications  Pt confirms with ED CM it has been awhile since [facility] has [facility] - CM encourage him to establish care services with [facility] for discounts   Discussed with pt his d/c medication list may change so further assist will be pended at this time -Voiced understanding    CM spoke [place] who [place] .  CM discussed and provided written information to assist pt with determining choice for uninsured accepting pcps, discussed the importance of pcp vs EDP services for f/u care, www.needymeds.org, www.goodrx.com, discounted pharmacies and other [place] resources such as [facility] , [facility], affordable care act, financial assistance, uninsured dental services, [place] med assist, DSS and  health department  Reviewed resources for [place] uninsured accepting pcps like [facility], [facility] [address], [facility], [facility], local urgent care centers, [facility], [facility], general medical clinics, family services of the [place], [facility] urgent care plus others, medication resources, [facility] out patient pharmacies and housing Pt voiced understanding and appreciation of resources provided   Provided [facility] contact information Pt agreed to a [facility] CM  Sent text to [facility] CM

## 2016-03-24 NOTE — ED Notes (Signed)
Pt has been informed about Urinalysis 

## 2016-03-24 NOTE — Progress Notes (Signed)
CSW went to speak with patient and Nurse was getting ready to transport patient to inpatient.   Stanley Edwards, LCSWA [phone] ED CSW 03/24/2016 2:22 PM

## 2016-03-24 NOTE — H&P (Signed)
History and Physical    Stanley Edwards Stanley Edwards DOB: 09/25/1965 DOA: 03/24/2016  PCP: Arnoldo Morale, MD Patient coming from: home  Chief Complaint: weakness  HPI: Stanley Edwards is a 50 y.o. male with medical history significant of IDDM, HTN, HLD, who ran out of his insulin and metformin about 2 months ago, and has not taking these since. When asked why, he answers because "I am a fool". He states that he has lost 25 pounds in the last couple months. He started feeling very weak, increased thirst and increased urination in the last couple weeks. He complains of a poor appetite. Because of his weakness, he had to miss couple of days at work. He denies any fever or chills. He denies any chest pain. He denies any abdominal pain, however complains of nausea. He also had an episode of emesis yesterday. He denies any lightheadedness or dizziness. He denies any focal weakness but feels weak "all over". He denies any dysuria or burning with urination.  ED Course: In the emergency room, patient was found to be in acute renal failure, clinically dehydrated, had an elevated CBG as well as a decreased bicarbonate and with an increased anion gap. He was diagnosed with DKA, started on insulin infusion, and TR[facility] was asked for admission.  Review of Systems: As per HPI otherwise 10 point review of systems negative.   Past Medical History:  Diagnosis Date  . Diabetes mellitus without complication ([place])   . Hypertension     History reviewed. No pertinent surgical history.   reports that he has never smoked. He has never used smokeless tobacco. He reports that he does not drink alcohol or use drugs.  No Known Allergies  History reviewed. No pertinent family history.  Prior to Admission medications   Medication Sig Start Date End Date Taking? Authorizing Provider  atorvastatin (LIPITOR) 20 MG tablet Take 1 tablet (20 mg total) by mouth daily. Patient not taking: Reported on 10/05/2015 09/24/15    Arnoldo Morale, MD  Blood Glucose Monitoring Suppl (TRUE METRIX METER) DEVI 1 each by Does not apply route 3 (three) times daily before meals. 09/21/15   Arnoldo Morale, MD  Blood Glucose Monitoring Suppl (TRUE METRIX METER) w/Device KIT  09/21/15   Historical Provider, MD  furosemide (LASIX) 20 MG tablet Take 1 tablet (20 mg total) by mouth daily. Patient not taking: Reported on 10/05/2015 10/05/15   Arnoldo Morale, MD  glipiZIDE (GLUCOTROL) 5 MG tablet Take 1 tablet (5 mg total) by mouth 2 (two) times daily before a meal. Patient not taking: Reported on 10/05/2015 10/05/15   Arnoldo Morale, MD  glucose blood (TRUE METRIX BLOOD GLUCOSE TEST) test strip Use 3 times daily before meals 09/21/15   Arnoldo Morale, MD  Insulin Detemir (LEVEMIR FLEXTOUCH) 100 UNIT/ML Pen Inject 20 Units into the skin daily at 10 pm. Patient not taking: Reported on 10/05/2015 10/05/15   Arnoldo Morale, MD  Insulin Pen Needle 31G X 5 MM MISC 1 each by Does not apply route daily. Patient not taking: Reported on 10/05/2015 10/05/15   Arnoldo Morale, MD  metFORMIN (GLUCOPHAGE) 500 MG tablet Take 2 tablets (1,000 mg total) by mouth 2 (two) times daily with a meal. 10/05/15   Arnoldo Morale, MD  TRUEPLUS LANCETS 28G MISC 1 each by Does not apply route 3 (three) times daily before meals. 09/21/15   Arnoldo Morale, MD    Physical Exam: Vitals:   03/24/16 1002 03/24/16 1006 03/24/16 1238 03/24/16 1300  BP:  106/73 129/86 127/81  Pulse:  120 113 107  Resp:  15 20   Temp:  97.4 F (36.3 C)    TempSrc:  Oral    SpO2: 97% 96% 96% 100%      Constitutional: NAD Vitals:   03/24/16 1002 03/24/16 1006 03/24/16 1238 03/24/16 1300  BP:  106/73 129/86 127/81  Pulse:  120 113 107  Resp:  15 20   Temp:  97.4 F (36.3 C)    TempSrc:  Oral    SpO2: 97% 96% 96% 100%   Eyes: PERRL, lids and conjunctivae normal ENMT: Mucous membranes are dry Neck: normal, supple, no masses, no thyromegaly Respiratory: clear to auscultation bilaterally, no wheezing, no  crackles.  Cardiovascular: Regular rate and rhythm, no murmurs / rubs / gallops. No extremity edema. Abdomen: mild tenderness throughout, no masses palpated. Bowel sounds positive.  Musculoskeletal: no clubbing / cyanosis. Normal muscle tone.  Skin: few abrasions bilateral shins, no erythema around it Neurologic: non focal  Psychiatric: Normal judgment and insight. Alert and oriented x 3. Normal mood.   Labs on Admission: I have personally reviewed following labs and imaging studies  CBC:  Recent Labs Lab 03/24/16 1032  WBC 6.7  HGB 16.0  HCT 48.1  MCV 102.8*  PLT 761   Basic Metabolic Panel:  Recent Labs Lab 03/24/16 1032  NA 141  K 4.4  CL 103  CO2 14*  GLUCOSE 388*  BUN 32*  CREATININE 1.83*  CALCIUM 10.1   GFR: CrCl cannot be calculated (Unknown ideal weight.). Liver Function Tests: No results for input(s): AST, ALT, ALKPHOS, BILITOT, PROT, ALBUMIN in the last 168 hours. No results for input(s): LIPASE, AMYLASE in the last 168 hours. No results for input(s): AMMONIA in the last 168 hours. Coagulation Profile: No results for input(s): INR, PROTIME in the last 168 hours. Cardiac Enzymes: No results for input(s): CKTOTAL, CKMB, CKMBINDEX, TROPONINI in the last 168 hours. BNP (last 3 results) No results for input(s): PROBNP in the last 8760 hours. HbA1C: No results for input(s): HGBA1C in the last 72 hours. CBG:  Recent Labs Lab 03/24/16 1004  GLUCAP 367*   Lipid Profile: No results for input(s): CHOL, HDL, LDLCALC, TRIG, CHOLHDL, LDLDIRECT in the last 72 hours. Thyroid Function Tests: No results for input(s): TSH, T4TOTAL, FREET4, T3FREE, THYROIDAB in the last 72 hours. Anemia Panel: No results for input(s): VITAMINB12, FOLATE, FERRITIN, TIBC, IRON, RETICCTPCT in the last 72 hours. Urine analysis:    Component Value Date/Time   COLORURINE YELLOW 09/16/2015 0255   APPEARANCEUR CLOUDY (A) 09/16/2015 0255   LABSPEC 1.025 09/16/2015 0255   PHURINE 5.0  09/16/2015 0255   GLUCOSEU >1000 (A) 09/16/2015 0255   HGBUR SMALL (A) 09/16/2015 0255   BILIRUBINUR MODERATE (A) 09/16/2015 0255   KETONESUR >80 (A) 09/16/2015 0255   PROTEINUR 30 (A) 09/16/2015 0255   NITRITE NEGATIVE 09/16/2015 0255   LEUKOCYTESUR NEGATIVE 09/16/2015 0255   Sepsis Labs: '@LABRCNTIP'$ (procalcitonin:4,lacticidven:4) )No results found for this or any previous visit (from the past 240 hour(s)).   Radiological Exams on Admission: No results found.   Assessment/Plan Active Problems:   DKA (diabetic ketoacidoses) (HCC)   DM2 (diabetes mellitus, type 2) ([place])   Noncompliance with medications   Hyperlipidemia   AKI (acute kidney injury) ([place])    DKA in the setting of poorly controlled type 2 diabetes - Patient has been off of his insulin for 2 months, he is type II and is probably why he was able to hold  on for this long, admit patient to stepdown, IV fluids, IV insulin, BMP every 4 hours - Resume long-acting insulin once and gap is closed, keep nothing by mouth for now  Medication noncompliance - Patient was counseled  Hyperlipidemia - Resume home statin  Hypertension - Patient carries a diagnosis of hypertension, however does not appear to be in any medication, closely monitor blood pressure and start blood pressure medications if indicated  Acute renal failure - Likely due to dehydration, prior normal creatinine values - Repeat creatinine after hydration    DVT prophylaxis: Lovenox  Code Status: Full  Family Communication: no family bedside Disposition Plan: admit to SDU Consults called: none  Admission status: inpatient    Marzetta Board, MD [facility] Pager [phone]  If 7PM-7AM, please contact night-coverage [email or web address] Password [email or web address]  03/24/2016, 1:16 PM

## 2016-03-24 NOTE — ED Provider Notes (Signed)
[facility] DEPT Provider Note   CSN: [id number] Arrival date & time: 03/24/16  4128     History   Chief Complaint Chief Complaint  Patient presents with  . Hyperglycemia  . Polyuria    HPI  Blood pressure 129/86, pulse 113, temperature 97.4 F (36.3 C), temperature source Oral, resp. rate 20, SpO2 96 %.  Stanley Edwards is a 50 y.o. male complaining of hyperglycemia and requesting refill on medications, he has been out of his metformin and insulin for 2 months, [facility] and did make an appointment for evaluation but did not show. He's been vomiting and feeling generally fatigued over the last 5 days he denies fever, chills, chest pain, cough, shortness of breath, abdominal pain and endorses polyuria with no hematuria or change in bowel habits.  HPI  Past Medical History:  Diagnosis Date  . Diabetes mellitus without complication ([place])   . Hypertension     Patient Active Problem List   Diagnosis Date Noted  . AKI (acute kidney injury) ([place]) 03/24/2016  . Hyperlipidemia 09/24/2015  . Noncompliance with medications 09/21/2015  . DM2 (diabetes mellitus, type 2) ([place]) 09/19/2015  . Hypokalemia 09/19/2015  . Anemia 09/19/2015  . DKA (diabetic ketoacidoses) ([place]) 09/16/2015    History reviewed. No pertinent surgical history.     Home Medications    Prior to Admission medications   Medication Sig Start Date End Date Taking? Authorizing Provider  Blood Glucose Monitoring Suppl (TRUE METRIX METER) DEVI 1 each by Does not apply route 3 (three) times daily before meals. 09/21/15  Yes Arnoldo Morale, MD  Blood Glucose Monitoring Suppl (TRUE METRIX METER) w/Device KIT  09/21/15  Yes Historical Provider, MD  glucose blood (TRUE METRIX BLOOD GLUCOSE TEST) test strip Use 3 times daily before meals 09/21/15  Yes Arnoldo Morale, MD  metFORMIN (GLUCOPHAGE) 500 MG tablet Take 2 tablets (1,000 mg total) by mouth 2 (two) times daily with a meal. 10/05/15  Yes  Arnoldo Morale, MD  TRUEPLUS LANCETS 28G MISC 1 each by Does not apply route 3 (three) times daily before meals. 09/21/15  Yes Arnoldo Morale, MD  atorvastatin (LIPITOR) 20 MG tablet Take 1 tablet (20 mg total) by mouth daily. Patient not taking: Reported on 10/05/2015 09/24/15   Arnoldo Morale, MD  furosemide (LASIX) 20 MG tablet Take 1 tablet (20 mg total) by mouth daily. Patient not taking: Reported on 03/24/2016 10/05/15   Arnoldo Morale, MD  glipiZIDE (GLUCOTROL) 5 MG tablet Take 1 tablet (5 mg total) by mouth 2 (two) times daily before a meal. Patient not taking: Reported on 03/24/2016 10/05/15   Arnoldo Morale, MD  Insulin Detemir (LEVEMIR FLEXTOUCH) 100 UNIT/ML Pen Inject 20 Units into the skin daily at 10 pm. Patient not taking: Reported on 03/24/2016 10/05/15   Arnoldo Morale, MD  Insulin Pen Needle 31G X 5 MM MISC 1 each by Does not apply route daily. Patient not taking: Reported on 03/24/2016 10/05/15   Arnoldo Morale, MD    Family History History reviewed. No pertinent family history.  Social History Social History  Substance Use Topics  . Smoking status: Never Smoker  . Smokeless tobacco: Never Used  . Alcohol use No     Allergies   Review of patient's allergies indicates no known allergies.   Review of Systems Review of Systems 10 systems reviewed and found to be negative, except as noted in the HPI.   Physical Exam Updated Vital Signs BP 133/79 (BP Location: Left  Arm)   Pulse 105   Temp 97.4 F (36.3 C) (Oral)   Resp 25   SpO2 100%   Physical Exam  Constitutional: He is oriented to person, place, and time. He appears well-developed and well-nourished. No distress.  HENT:  Head: Normocephalic and atraumatic.  Mouth/Throat: Oropharynx is clear and moist.  Eyes: Conjunctivae and EOM are normal. Pupils are equal, round, and reactive to light.  Neck: Normal range of motion.  Cardiovascular: Regular rhythm and intact distal pulses.   Mild tachycardia  Pulmonary/Chest: Effort  normal and breath sounds normal. No respiratory distress. He has no wheezes. He has no rales. He exhibits no tenderness.  Abdominal: Soft. He exhibits no distension and no mass. There is no tenderness. There is no rebound and no guarding. No hernia.  Musculoskeletal: Normal range of motion.  Neurological: He is alert and oriented to person, place, and time.  Skin: He is not diaphoretic.  Psychiatric: He has a normal mood and affect.  Nursing note and vitals reviewed.    ED Treatments / Results  Labs (all labs ordered are listed, but only abnormal results are displayed) Labs Reviewed  BASIC METABOLIC PANEL - Abnormal; Notable for the following:       Result Value   CO2 14 (*)    Glucose, Bld 388 (*)    BUN 32 (*)    Creatinine, Ser 1.83 (*)    GFR calc non Af Amer 41 (*)    GFR calc Af Amer 48 (*)    Anion gap 24 (*)    All other components within normal limits  CBC - Abnormal; Notable for the following:    MCV 102.8 (*)    MCH 34.2 (*)    All other components within normal limits  BASIC METABOLIC PANEL - Abnormal; Notable for the following:    Chloride 100 (*)    CO2 15 (*)    Glucose, Bld 417 (*)    BUN 35 (*)    Creatinine, Ser 1.99 (*)    GFR calc non Af Amer 37 (*)    GFR calc Af Amer 43 (*)    Anion gap 26 (*)    All other components within normal limits  CBG MONITORING, ED - Abnormal; Notable for the following:    Glucose-Capillary 367 (*)    All other components within normal limits  CBG MONITORING, ED - Abnormal; Notable for the following:    Glucose-Capillary 356 (*)    All other components within normal limits  LIPASE, BLOOD  URINALYSIS, ROUTINE W REFLEX MICROSCOPIC (NOT AT [facility])  BASIC METABOLIC PANEL  BASIC METABOLIC PANEL  BASIC METABOLIC PANEL  I-STAT TROPOININ, ED    EKG  EKG Interpretation  Date/Time:  Monday March 24 2016 13:40:08 EDT Ventricular Rate:  110 PR Interval:    QRS Duration: 80 QT Interval:  332 QTC Calculation: 450 R  Axis:   85 Text Interpretation:  Sinus tachycardia Nonspecific repol abnormality, diffuse leads No significant change since last tracing Confirmed by Winfred Leeds  MD, SAM [phone]) on 03/24/2016 1:42:22 PM       Radiology Dg Chest Port 1 View  Result Date: 03/24/2016 CLINICAL DATA:  Diabetic ketoacidosis. EXAM: PORTABLE CHEST 1 VIEW COMPARISON:  Chest x-ray 09/18/2015 FINDINGS: The heart size and mediastinal contours are within normal limits. Both lungs are clear. The visualized skeletal structures are unremarkable. IMPRESSION: Normal chest. Electronically Signed   By: Lorriane Shire M.D.   On: 03/24/2016 13:17    Procedures  Procedures (including critical care time)  Medications Ordered in ED Medications  insulin regular (NOVOLIN R,HUMULIN R) 250 Units in sodium chloride 0.9 % 250 mL (1 Units/mL) infusion (3 Units/hr Intravenous New Bag/Given 03/24/16 1339)  sodium chloride 0.9 % bolus 1,000 mL (0 mLs Intravenous Stopped 03/24/16 1348)    And  sodium chloride 0.9 % bolus 1,000 mL (1,000 mLs Intravenous New Bag/Given 03/24/16 1359)  0.9 %  sodium chloride infusion ( Intravenous New Bag/Given 03/24/16 1344)  dextrose 5 %-0.45 % sodium chloride infusion (not administered)  enoxaparin (LOVENOX) injection 40 mg (not administered)  potassium chloride 10 mEq in 100 mL IVPB (10 mEq Intravenous New Bag/Given 03/24/16 1359)     Initial Impression / Assessment and Plan / ED Course  I have reviewed the triage vital signs and the nursing notes.  Pertinent labs & imaging results that were available during my care of the patient were reviewed by me and considered in my medical decision making (see chart for details).  Clinical Course    Vitals:   03/24/16 1238 03/24/16 1300 03/24/16 1331 03/24/16 1404  BP: 129/86 127/81 127/81 133/79  Pulse: 113 107 114 105  Resp: _0 Temp:      TempSrc:      SpO2: 96% 100% 100% 100%    Medications  insulin regular (NOVOLIN R,HUMULIN R) 250 Units in  sodium chloride 0.9 % 250 mL (1 Units/mL) infusion (3 Units/hr Intravenous New Bag/Given 03/24/16 1339)  sodium chloride 0.9 % bolus 1,000 mL (0 mLs Intravenous Stopped 03/24/16 1348)    And  sodium chloride 0.9 % bolus 1,000 mL (1,000 mLs Intravenous New Bag/Given 03/24/16 1359)  0.9 %  sodium chloride infusion ( Intravenous New Bag/Given 03/24/16 1344)  dextrose 5 %-0.45 % sodium chloride infusion (not administered)  enoxaparin (LOVENOX) injection 40 mg (not administered)  potassium chloride 10 mEq in 100 mL IVPB (10 mEq Intravenous New Bag/Given 03/24/16 1359)    Stanley Edwards is 50 y.o. male requesting refill on medications, has not had his metformin or insulin in 2 months, made an appointment with his primary care at the [facility] but did not show up, cannot tell me why he didn't go. Patient states he feels generally fatigued, he's had intermittent emesis and weight loss. No chest pain, shortness of breath, fever, chills. Tachycardic, patient will be given fluid bolus and will check basic labs and urinalysis.  Patient is a blood sugar of 356 his bicarbonate is low at 15, creatinine is elevated at 1.9 this is not typical for his baseline he has an anion gap of 26, this is likely DKA, patient initially didn't want to get change into a gown or have IV, eventually acquiesced,. The source of his issues or medication noncompliance, there does not appear to be a infectious source, out of an abundance of caution will obtain chest x-ray, EKG and troponin, patient denies any chest pain or shortness of breath.  Patient will be admitted to [facility] hospital[facility] Dr. Cruzita Lederer.   This is a shared visit with the attending physician who personally evaluated the patient and agrees with the care plan.     Final Clinical Impressions(s) / ED Diagnoses   Final diagnoses:  Diabetic ketoacidosis without coma associated with type 2 diabetes mellitus ([place])  Medically noncompliant    New Prescriptions New  Prescriptions   No medications on file     Monico Blitz, PA-C 03/24/16 [place], MD 03/24/16 1550

## 2016-03-25 DIAGNOSIS — N182 Chronic kidney disease, stage 2 (mild): Secondary | ICD-10-CM

## 2016-03-25 DIAGNOSIS — Z794 Long term (current) use of insulin: Secondary | ICD-10-CM

## 2016-03-25 DIAGNOSIS — E1122 Type 2 diabetes mellitus with diabetic chronic kidney disease: Secondary | ICD-10-CM

## 2016-03-25 LAB — CBC
HCT: 38.6 % — ABNORMAL LOW (ref 39.0–52.0)
Hemoglobin: 13.5 g/dL (ref 13.0–17.0)
MCH: 34.7 pg — ABNORMAL HIGH (ref 26.0–34.0)
MCHC: 35 g/dL (ref 30.0–36.0)
MCV: 99.2 fL (ref 78.0–100.0)
Platelets: 277 10*3/uL (ref 150–400)
RBC: 3.89 MIL/uL — ABNORMAL LOW (ref 4.22–5.81)
RDW: 11.7 % (ref 11.5–15.5)
WBC: 8.8 10*3/uL (ref 4.0–10.5)

## 2016-03-25 LAB — GLUCOSE, CAPILLARY
Glucose-Capillary: 115 mg/dL — ABNORMAL HIGH (ref 65–99)
Glucose-Capillary: 122 mg/dL — ABNORMAL HIGH (ref 65–99)
Glucose-Capillary: 174 mg/dL — ABNORMAL HIGH (ref 65–99)
Glucose-Capillary: 183 mg/dL — ABNORMAL HIGH (ref 65–99)
Glucose-Capillary: 198 mg/dL — ABNORMAL HIGH (ref 65–99)
Glucose-Capillary: 273 mg/dL — ABNORMAL HIGH (ref 65–99)

## 2016-03-25 LAB — BASIC METABOLIC PANEL
Anion gap: 8 (ref 5–15)
BUN: 25 mg/dL — ABNORMAL HIGH (ref 6–20)
CO2: 24 mmol/L (ref 22–32)
Calcium: 9.2 mg/dL (ref 8.9–10.3)
Chloride: 111 mmol/L (ref 101–111)
Creatinine, Ser: 1.31 mg/dL — ABNORMAL HIGH (ref 0.61–1.24)
GFR calc Af Amer: >60 mL/min (ref >60)
GFR calc non Af Amer: >60 mL/min (ref >60)
Glucose, Bld: 122 mg/dL — ABNORMAL HIGH (ref 65–99)
Potassium: 4 mmol/L (ref 3.5–5.1)
Sodium: 143 mmol/L (ref 135–145)

## 2016-03-25 MED ORDER — INSULIN ASPART 100 UNIT/ML ~~LOC~~ SOLN
0.0000 [IU] | Freq: Three times a day (TID) | SUBCUTANEOUS | Status: DC
Start: 2016-03-25 — End: 2016-03-26
  Administered 2016-03-25: 8 [IU] via SUBCUTANEOUS
  Administered 2016-03-25 (×2): 3 [IU] via SUBCUTANEOUS
  Administered 2016-03-26: 2 [IU] via SUBCUTANEOUS
  Administered 2016-03-26: 3 [IU] via SUBCUTANEOUS

## 2016-03-25 MED ORDER — INSULIN ASPART 100 UNIT/ML ~~LOC~~ SOLN
3.0000 [IU] | Freq: Three times a day (TID) | SUBCUTANEOUS | Status: DC
  Administered 2016-03-25 – 2016-03-26 (×3): 3 [IU] via SUBCUTANEOUS

## 2016-03-25 NOTE — Hospital Discharge Follow-Up (Signed)
Message received from Marval RegalKim Gibbs, RN CM [facility] Venetia NightAmao. Will plan to meet with the patient to discuss follow up and [facility].

## 2016-03-25 NOTE — Progress Notes (Addendum)
LCSWA met with patient at bedside and explained reason for consult-Financial Assistance. Patient reports he does not have any financial concern at this time. He reports, "I have an outstanding bill with [facility] and I am trying to pay it at this time but that's it." Patient was receptive to [employer] for financial assistance.  Patient reports no other concerns at this time.   Kathrin Greathouse, Latanya Presser, MSW Clinical Social Worker 5E and Psychiatric Service Line [phone] 03/25/2016  2:31 PM

## 2016-03-25 NOTE — Progress Notes (Signed)
PROGRESS NOTE  Stanley Medinalonzo D Evelyn JYN:[id number] DOB: 1965-10-20 DOA: 03/24/2016 PCP: Jaclyn ShaggyEnobong, Amao, MD   LOS: 1 day   Brief Narrative: Stanley Edwards is a 50 y.o. male with medical history significant of IDDM, HTN, HLD, who ran out of his insulin and metformin about 2 months ago, and has not taking these since, admitted 8/28 with DKA.  Assessment & Plan: Active Problems:   DKA (diabetic ketoacidoses) ([facility])   DM2 (diabetes mellitus, type 2) ([facility])   Noncompliance with medications   Hyperlipidemia   AKI (acute kidney injury) ([facility])   DKA in the setting of poorly controlled type 2 diabetes - Patient has been off of his insulin for 2 months, he is type II and is probably why he was able to hold on for this long,  - Patient's anion gap improved, provide long-acting insulin, stop insulin infusion, transfer out to stepdown today - CBG is again creeping up at lunchtime, and had scheduled 3 units of NovoLog in addition to the sliding scale  Medication noncompliance - Patient was counseled  Hyperlipidemia - Resume home statin  Hypertension - Patient carries a diagnosis of hypertension, however does not appear to be on any medication, closely monitor blood pressure and start blood pressure medications if indicated - Currently he remains normotensive  Acute renal failure - Likely due to dehydration, prior normal creatinine values - Creatinine has improved with IV fluids, repeat BMP tomorrow morning  DVT prophylaxis: Lovenox Code Status: Full code Family Communication: No family at bedside Disposition Plan: Home tomorrow if CBGs controlled and renal function stable  Consultants:   None  Procedures:   none  Antimicrobials:  None    Subjective: - no chest pain, shortness of breath, no abdominal pain - Feeling mildly nauseous after lunch  Objective: Vitals:   03/25/16 0600 03/25/16 0736 03/25/16 0800 03/25/16 1026  BP: 112/65 110/68  125/79  Pulse: 83 85  96  Resp: 18  20  20   Temp:   98.6 F (37 C) 98.8 F (37.1 C)  TempSrc:   Oral Oral  SpO2: 99% 98%  98%  Weight:      Height:        Intake/Output Summary (Last 24 hours) at 03/25/16 1354 Last data filed at 03/25/16 0400  Gross per 24 hour  Intake            869.5 ml  Output             1650 ml  Net           -780.5 ml   Filed Weights   03/24/16 1500  Weight: 80.5 kg (177 lb 7.5 oz)    Examination: Constitutional: NAD Vitals:   03/25/16 0600 03/25/16 0736 03/25/16 0800 03/25/16 1026  BP: 112/65 110/68  125/79  Pulse: 83 85  96  Resp: 18 20  20   Temp:   98.6 F (37 C) 98.8 F (37.1 C)  TempSrc:   Oral Oral  SpO2: 99% 98%  98%  Weight:      Height:       Eyes: PERRL, lids and conjunctivae normal Respiratory: clear to auscultation bilaterally, no wheezing, no crackles. Normal respiratory effort.  Cardiovascular: Regular rate and rhythm, no murmurs / rubs / gallops. No LE edema. 2+ pedal pulses.   Abdomen: no tenderness. Bowel sounds positive.  Neurologic: non focal Psychiatric: Alert and oriented x 3. Normal mood.    Data Reviewed: I have personally reviewed following labs and imaging studies  CBC:  Recent Labs Lab 03/24/16 1032 03/25/16 0128  WBC 6.7 8.8  HGB 16.0 13.5  HCT 48.1 38.6*  MCV 102.8* 99.2  PLT 362 277   Basic Metabolic Panel:  Recent Labs Lab 03/24/16 1032 03/24/16 1333 03/24/16 1702 03/24/16 2105 03/25/16 0128  NA 141 141 140 141 143  K 4.4 4.8 4.6 4.0 4.0  CL 103 100* 106 109 111  CO2 14* 15* 18* 20* 24  GLUCOSE 388* 417* 221* 194* 122*  BUN 32* 35* 31* 27* 25*  CREATININE 1.83* 1.99* 1.74* 1.44* 1.31*  CALCIUM 10.1 9.9 8.8* 8.8* 9.2   GFR: Estimated Creatinine Clearance: 76 mL/min (by C-G formula based on SCr of 1.31 mg/dL). Liver Function Tests: No results for input(s): AST, ALT, ALKPHOS, BILITOT, PROT, ALBUMIN in the last 168 hours.  Recent Labs Lab 03/24/16 1032  LIPASE 23   No results for input(s): AMMONIA in the last 168  hours. Coagulation Profile: No results for input(s): INR, PROTIME in the last 168 hours. Cardiac Enzymes: No results for input(s): CKTOTAL, CKMB, CKMBINDEX, TROPONINI in the last 168 hours. BNP (last 3 results) No results for input(s): PROBNP in the last 8760 hours. HbA1C: No results for input(s): HGBA1C in the last 72 hours. CBG:  Recent Labs Lab 03/24/16 2311 03/25/16 0044 03/25/16 0152 03/25/16 0744 03/25/16 1137  GLUCAP 160* 122* 115* 174* 273*   Lipid Profile: No results for input(s): CHOL, HDL, LDLCALC, TRIG, CHOLHDL, LDLDIRECT in the last 72 hours. Thyroid Function Tests: No results for input(s): TSH, T4TOTAL, FREET4, T3FREE, THYROIDAB in the last 72 hours. Anemia Panel: No results for input(s): VITAMINB12, FOLATE, FERRITIN, TIBC, IRON, RETICCTPCT in the last 72 hours. Urine analysis:    Component Value Date/Time   COLORURINE YELLOW 03/24/2016 1008   APPEARANCEUR CLEAR 03/24/2016 1008   LABSPEC 1.032 (H) 03/24/2016 1008   PHURINE 5.0 03/24/2016 1008   GLUCOSEU >1000 (A) 03/24/2016 1008   HGBUR NEGATIVE 03/24/2016 1008   BILIRUBINUR SMALL (A) 03/24/2016 1008   KETONESUR >80 (A) 03/24/2016 1008   PROTEINUR NEGATIVE 03/24/2016 1008   NITRITE NEGATIVE 03/24/2016 1008   LEUKOCYTESUR NEGATIVE 03/24/2016 1008   Sepsis Labs: Invalid input(s): PROCALCITONIN, LACTICIDVEN  Recent Results (from the past 240 hour(s))  MRSA PCR Screening     Status: None   Collection Time: 03/24/16  3:00 PM  Result Value Ref Range Status   MRSA by PCR NEGATIVE NEGATIVE Final    Comment:        The GeneXpert MRSA Assay (FDA approved for NASAL specimens only), is one component of a comprehensive MRSA colonization surveillance program. It is not intended to diagnose MRSA infection nor to guide or monitor treatment for MRSA infections.       Radiology Studies: Dg Chest Port 1 View  Result Date: 03/24/2016 CLINICAL DATA:  Diabetic ketoacidosis. EXAM: PORTABLE CHEST 1 VIEW  COMPARISON:  Chest x-ray 09/18/2015 FINDINGS: The heart size and mediastinal contours are within normal limits. Both lungs are clear. The visualized skeletal structures are unremarkable. IMPRESSION: Normal chest. Electronically Signed   By: Francene Boyers M.D.   On: 03/24/2016 13:17     Scheduled Meds: . atorvastatin  20 mg Oral Daily  . chlorhexidine  15 mL Mouth Rinse BID  . enoxaparin (LOVENOX) injection  40 mg Subcutaneous Q24H  . insulin aspart  0-15 Units Subcutaneous TID WC  . insulin aspart  3 Units Subcutaneous TID WC  . insulin detemir  20 Units Subcutaneous Daily  . mouth rinse  15 mL Mouth Rinse q12n4p   Continuous Infusions:    Pamella Pert, MD, PhD [facility] Pager [phone]  If 7PM-7AM, please contact night-coverage [email or web address] Password [email or web address] 03/25/2016, 1:54 PM

## 2016-03-26 DIAGNOSIS — E1310 Other specified diabetes mellitus with ketoacidosis without coma: Principal | ICD-10-CM

## 2016-03-26 LAB — BASIC METABOLIC PANEL
Anion gap: 7 (ref 5–15)
BUN: 21 mg/dL — ABNORMAL HIGH (ref 6–20)
CO2: 28 mmol/L (ref 22–32)
Calcium: 8.7 mg/dL — ABNORMAL LOW (ref 8.9–10.3)
Chloride: 100 mmol/L — ABNORMAL LOW (ref 101–111)
Creatinine, Ser: 1.04 mg/dL (ref 0.61–1.24)
GFR calc Af Amer: >60 mL/min (ref >60)
GFR calc non Af Amer: >60 mL/min (ref >60)
Glucose, Bld: 143 mg/dL — ABNORMAL HIGH (ref 65–99)
Potassium: 3.8 mmol/L (ref 3.5–5.1)
Sodium: 135 mmol/L (ref 135–145)

## 2016-03-26 LAB — CBC
HCT: 32.8 % — ABNORMAL LOW (ref 39.0–52.0)
Hemoglobin: 11.3 g/dL — ABNORMAL LOW (ref 13.0–17.0)
MCH: 33.7 pg (ref 26.0–34.0)
MCHC: 34.5 g/dL (ref 30.0–36.0)
MCV: 97.9 fL (ref 78.0–100.0)
Platelets: 217 10*3/uL (ref 150–400)
RBC: 3.35 MIL/uL — ABNORMAL LOW (ref 4.22–5.81)
RDW: 11.4 % — ABNORMAL LOW (ref 11.5–15.5)
WBC: 5 10*3/uL (ref 4.0–10.5)

## 2016-03-26 LAB — GLUCOSE, CAPILLARY
Glucose-Capillary: 162 mg/dL — ABNORMAL HIGH (ref 65–99)
Glucose-Capillary: 139 mg/dL — ABNORMAL HIGH (ref 65–99)

## 2016-03-26 LAB — HEMOGLOBIN A1C
Hgb A1c MFr Bld: 12.5 % — ABNORMAL HIGH (ref 4.8–5.6)
Mean Plasma Glucose: 312 mg/dL

## 2016-03-26 MED ORDER — INSULIN ASPART PROT & ASPART (70-30 MIX) 100 UNIT/ML ~~LOC~~ SUSP
15.0000 [IU] | Freq: Two times a day (BID) | SUBCUTANEOUS | 0 refills | Status: DC
Start: 2016-03-26 — End: 2016-04-03

## 2016-03-26 MED FILL — NOVOLOG MIX 70/30 VIAL: (70-30) 100 | 33 days supply | Qty: 10 | Fill #0

## 2016-03-26 NOTE — Progress Notes (Signed)
Date: March 26, 2016 Discharge orders checked for needs. Match program for medications done and explained to patient.  Patient is also aware of appointment at the health with Dr. Royston SinnerAamo. Marcelle Smilinghonda Markelle Najarian, RN, BSN, [place]   [phone]

## 2016-03-26 NOTE — Hospital Discharge Follow-Up (Signed)
Communication received from Marval RegalKim Gibbs, RN CM regarding patient needing appointment. Patient does not qualify [facility] Dr. Venetia NightAmao. Patient on Novolog and Levemir. Spoke with Milderd Meagerriana, Pharmacy [facility] at [facility] who indicated [facility]. Marcelle Smilinghonda Davis, RN CM updated, and she indicated she would determine if patient eligible [facility]. Spoke with patient and hospital follow-up appointment scheduled for 04/03/16 at 1145 with Dr. Venetia NightAmao. Patient appreciative of appointment.

## 2016-03-26 NOTE — Discharge Summary (Signed)
Physician Discharge Summary  Stanley Edwards OQH:[id number] DOB: [birth year] DOA: 03/24/2016  PCP: Arnoldo Morale, MD  Admit date: 03/24/2016 Discharge date: 03/26/2016  Time spent: > 35 minutes  Recommendations for Outpatient Follow-up:  1. Please monitor blood glucose levels and adjust hypoglycemic agents accordingly    Discharge Diagnoses:  Active Problems:   DKA (diabetic ketoacidoses) (HCC)   DM2 (diabetes mellitus, type 2) ([place])   Noncompliance with medications   Hyperlipidemia   AKI (acute kidney injury) ([place])   Discharge Condition: stable  Diet recommendation: Heart healthy diet  Filed Weights   03/24/16 1500  Weight: 80.5 kg (177 lb 7.5 oz)    History of present illness:  50 year old history diabetes who ran out of his insulin and did not refill. Subsequently presented with a.  Hospital Course:  DKA -Resolved patient's blood sugars are better controlled now on subcutaneous insulin. Patient will be discharged on NovoLog 70/30 15 units subcutaneous twice a day and metformin  Procedures:  none  Consultations:  Noen  Discharge Exam: Vitals:   03/26/16 0230 03/26/16 0500  BP: (!) 95/53 104/60  Pulse: 70 (!) 59  Resp: 18   Temp: 98.5 F (36.9 C) 98.6 F (37 C)    General: pt in nad, alert and awake Cardiovascular: rrr, no rubs  Respiratory: no increased wob, no wheezes  Discharge Instructions   Discharge Instructions    Call MD for:  redness, tenderness, or signs of infection (pain, swelling, redness, odor or green/yellow discharge around incision site)    Complete by:  As directed   Call MD for:  temperature >100.4    Complete by:  As directed   Diet - low sodium heart healthy    Complete by:  As directed   Increase activity slowly    Complete by:  As directed     Current Discharge Medication List    START taking these medications   Details  insulin aspart protamine- aspart (NOVOLOG MIX 70/30) (70-30) 100 UNIT/ML injection Inject 0.15 mLs (15  Units total) into the skin 2 (two) times daily with a meal. Qty: 10 mL, Refills: 0      CONTINUE these medications which have NOT CHANGED   Details  Blood Glucose Monitoring Suppl (TRUE METRIX METER) DEVI 1 each by Does not apply route 3 (three) times daily before meals. Qty: 1 Device, Refills: 0   Associated Diagnoses: Type 2 diabetes mellitus with hyperglycemia, with long-term current use of insulin (HCC)    Blood Glucose Monitoring Suppl (TRUE METRIX METER) w/Device KIT Refills: 0   Associated Diagnoses: Type 2 diabetes mellitus with hyperglycemia, with long-term current use of insulin (HCC)    glucose blood (TRUE METRIX BLOOD GLUCOSE TEST) test strip Use 3 times daily before meals Qty: 100 each, Refills: 12   Associated Diagnoses: Type 2 diabetes mellitus with hyperglycemia, with long-term current use of insulin (HCC)    metFORMIN (GLUCOPHAGE) 500 MG tablet Take 2 tablets (1,000 mg total) by mouth 2 (two) times daily with a meal. Qty: 120 tablet, Refills: 3   Associated Diagnoses: Type 2 diabetes mellitus with hyperglycemia, with long-term current use of insulin (HCC)    TRUEPLUS LANCETS 28G MISC 1 each by Does not apply route 3 (three) times daily before meals. Qty: 100 each, Refills: 12   Associated Diagnoses: Type 2 diabetes mellitus with hyperglycemia, with long-term current use of insulin (HCC)    atorvastatin (LIPITOR) 20 MG tablet Take 1 tablet (20 mg total) by mouth daily. Qty: 30  tablet, Refills: 2   Associated Diagnoses: Hyperlipidemia    furosemide (LASIX) 20 MG tablet Take 1 tablet (20 mg total) by mouth daily. Qty: 5 tablet, Refills: 0    Insulin Pen Needle 31G X 5 MM MISC 1 each by Does not apply route daily. Qty: 30 each, Refills: 11   Associated Diagnoses: Type 2 diabetes mellitus with hyperglycemia, with long-term current use of insulin (HCC)      STOP taking these medications     glipiZIDE (GLUCOTROL) 5 MG tablet      Insulin Detemir (LEVEMIR FLEXTOUCH)  100 UNIT/ML Pen        No Known Allergies    The results of significant diagnostics from this hospitalization (including imaging, microbiology, ancillary and laboratory) are listed below for reference.    Significant Diagnostic Studies: Dg Chest Port 1 View  Result Date: 03/24/2016 CLINICAL DATA:  Diabetic ketoacidosis. EXAM: PORTABLE CHEST 1 VIEW COMPARISON:  Chest x-ray 09/18/2015 FINDINGS: The heart size and mediastinal contours are within normal limits. Both lungs are clear. The visualized skeletal structures are unremarkable. IMPRESSION: Normal chest. Electronically Signed   By: Lorriane Shire M.D.   On: 03/24/2016 13:17    Microbiology: Recent Results (from the past 240 hour(s))  MRSA PCR Screening     Status: None   Collection Time: 03/24/16  3:00 PM  Result Value Ref Range Status   MRSA by PCR NEGATIVE NEGATIVE Final    Comment:        The GeneXpert MRSA Assay (FDA approved for NASAL specimens only), is one component of a comprehensive MRSA colonization surveillance program. It is not intended to diagnose MRSA infection nor to guide or monitor treatment for MRSA infections.      Labs: Basic Metabolic Panel:  Recent Labs Lab 03/24/16 1333 03/24/16 1702 03/24/16 2105 03/25/16 0128 03/26/16 0542  NA 141 140 141 143 135  K 4.8 4.6 4.0 4.0 3.8  CL 100* 106 109 111 100*  CO2 15* 18* 20* 24 28  GLUCOSE 417* 221* 194* 122* 143*  BUN 35* 31* 27* 25* 21*  CREATININE 1.99* 1.74* 1.44* 1.31* 1.04  CALCIUM 9.9 8.8* 8.8* 9.2 8.7*   Liver Function Tests: No results for input(s): AST, ALT, ALKPHOS, BILITOT, PROT, ALBUMIN in the last 168 hours.  Recent Labs Lab 03/24/16 1032  LIPASE 23   No results for input(s): AMMONIA in the last 168 hours. CBC:  Recent Labs Lab 03/24/16 1032 03/25/16 0128 03/26/16 0542  WBC 6.7 8.8 5.0  HGB 16.0 13.5 11.3*  HCT 48.1 38.6* 32.8*  MCV 102.8* 99.2 97.9  PLT 362 277 217   Cardiac Enzymes: No results for input(s):  CKTOTAL, CKMB, CKMBINDEX, TROPONINI in the last 168 hours. BNP: BNP (last 3 results) No results for input(s): BNP in the last 8760 hours.  ProBNP (last 3 results) No results for input(s): PROBNP in the last 8760 hours.  CBG:  Recent Labs Lab 03/25/16 1137 03/25/16 1654 03/25/16 2047 03/26/16 0749 03/26/16 1152  GLUCAP 273* 183* 198* 162* 139*   Signed:  Velvet Bathe MD.  [facility] 03/26/2016, 3:25 PM

## 2016-03-26 NOTE — Progress Notes (Addendum)
Inpatient Diabetes Program Recommendations  AACE/ADA: New Consensus Statement on Inpatient Glycemic Control (2015)  Target Ranges:  Prepandial:   less than 140 mg/dL      Peak postprandial:   less than 180 mg/dL (1-2 hours)      Critically ill patients:  140 - 180 mg/dL   Results for Stanley Edwards, Stanley Edwards (MRN [id number]) as of 03/26/2016 09:17  Ref. Range 03/25/2016 07:44 03/25/2016 11:37 03/25/2016 16:54 03/25/2016 20:47  Glucose-Capillary Latest Ref Range: 65 - 99 mg/dL 413174 (H) 244273 (H) 010183 (H) 198 (H)   Results for Stanley Edwards, Stanley Edwards (MRN [id number]) as of 03/26/2016 09:17  Ref. Range 03/26/2016 07:49  Glucose-Capillary Latest Ref Range: 65 - 99 mg/dL 034162 (H)    Current Insulin Orders: Levemir 20 units daily      Novolog Moderate Correction Scale/ SSI (0-15 units) TID AC      Novolog 3 units tidwc      -Note Novolog 3 units tidwc (meal coverage) started yesterday at suppertime.  -Fasting glucose stable this AM at 162 mg/dl.  -Current V4QA1c pending.  Expect A1c to be elevated since notes state that patient ran out of insulin and oral DM meds about 2 months ago.  -Care management [facility] with patient [facility].  -Through Chart review, note that patient [facility] and he was switched from 70/30 insulin to Levemir insulin.  Glipizide was added and Metformin dose was also increased at that PCP visit back in March.      --Will follow patient during hospitalization--  Ambrose FinlandJeannine Johnston Patte Winkel RN, MSN, CDE Diabetes Coordinator Inpatient Glycemic Control Team Team Pager: [phone] (8a-5p)

## 2016-03-26 NOTE — Progress Notes (Signed)
Patient's 70/30 insulin was filled through [facility], and match assistance program prior to patient leaving.  Patient stated that he had experience drawing up and giving himself insulin, and did not need any reinforcement.  Patient given discharge instructions, and verbalized an understanding of all discharge instructions.  Patient agrees with discharge plan, and is being discharged in stable medical condition.  Patient given transportation via wheelchair.

## 2016-04-03 ENCOUNTER — Ambulatory Visit: Payer: Self-pay | Attending: Family Medicine | Admitting: Family Medicine

## 2016-04-03 ENCOUNTER — Encounter: Payer: Self-pay | Admitting: Family Medicine

## 2016-04-03 VITALS — BP 91/62 | HR 97 | Temp 98.7°F | Ht 75.0 in | Wt 189.4 lb

## 2016-04-03 DIAGNOSIS — Z91199 Patient's noncompliance with other medical treatment and regimen due to unspecified reason: Secondary | ICD-10-CM | POA: Insufficient documentation

## 2016-04-03 DIAGNOSIS — E1122 Type 2 diabetes mellitus with diabetic chronic kidney disease: Secondary | ICD-10-CM | POA: Insufficient documentation

## 2016-04-03 DIAGNOSIS — E785 Hyperlipidemia, unspecified: Secondary | ICD-10-CM | POA: Insufficient documentation

## 2016-04-03 DIAGNOSIS — Z794 Long term (current) use of insulin: Secondary | ICD-10-CM | POA: Insufficient documentation

## 2016-04-03 DIAGNOSIS — I129 Hypertensive chronic kidney disease with stage 1 through stage 4 chronic kidney disease, or unspecified chronic kidney disease: Secondary | ICD-10-CM | POA: Insufficient documentation

## 2016-04-03 DIAGNOSIS — Z9119 Patient's noncompliance with other medical treatment and regimen: Secondary | ICD-10-CM | POA: Insufficient documentation

## 2016-04-03 DIAGNOSIS — Z9114 Patient's other noncompliance with medication regimen: Secondary | ICD-10-CM | POA: Insufficient documentation

## 2016-04-03 DIAGNOSIS — N182 Chronic kidney disease, stage 2 (mild): Secondary | ICD-10-CM | POA: Insufficient documentation

## 2016-04-03 DIAGNOSIS — E1165 Type 2 diabetes mellitus with hyperglycemia: Secondary | ICD-10-CM | POA: Insufficient documentation

## 2016-04-03 LAB — GLUCOSE, POCT (MANUAL RESULT ENTRY)
POC Glucose: 368 mg/dl — AB (ref 70–99)
POC Glucose: 371 mg/dl — AB (ref 70–99)

## 2016-04-03 MED ORDER — INSULIN ASPART 100 UNIT/ML ~~LOC~~ SOLN
7.0000 [IU] | Freq: Once | SUBCUTANEOUS | Status: AC
  Administered 2016-04-03: 7 [IU] via SUBCUTANEOUS

## 2016-04-03 MED ORDER — INSULIN GLARGINE 100 UNIT/ML SOLOSTAR PEN: 40.0000 [IU] | PEN_INJECTOR | Freq: Every day | SUBCUTANEOUS | 1 refills | Status: DC

## 2016-04-03 MED ORDER — INSULIN PEN NEEDLE 31G X 5 MM MISC: 1.0000 | Freq: Every day | 11 refills | Status: DC

## 2016-04-03 MED ORDER — ATORVASTATIN CALCIUM 20 MG PO TABS: 20.0000 mg | ORAL_TABLET | Freq: Every day | ORAL | 5 refills | Status: DC

## 2016-04-03 NOTE — Patient Instructions (Signed)
Diabetes Mellitus and Food It is important for you to manage your blood sugar (glucose) level. Your blood glucose level can be greatly affected by what you eat. Eating healthier foods in the appropriate amounts throughout the day at about the same time each day will help you control your blood glucose level. It can also help slow or prevent worsening of your diabetes mellitus. Healthy eating may even help you improve the level of your blood pressure and reach or maintain a healthy weight.  General recommendations for healthful eating and cooking habits include:  Eating meals and snacks regularly. Avoid going long periods of time without eating to lose weight.  Eating a diet that consists mainly of plant-based foods, such as fruits, vegetables, nuts, legumes, and whole grains.  Using low-heat cooking methods, such as baking, instead of high-heat cooking methods, such as deep frying. Work with your dietitian to make sure you understand how to use the Nutrition Facts information on food labels. HOW CAN FOOD AFFECT ME? Carbohydrates Carbohydrates affect your blood glucose level more than any other type of food. Your dietitian will help you determine how many carbohydrates to eat at each meal and teach you how to count carbohydrates. Counting carbohydrates is important to keep your blood glucose at a healthy level, especially if you are using insulin or taking certain medicines for diabetes mellitus. Alcohol Alcohol can cause sudden decreases in blood glucose (hypoglycemia), especially if you use insulin or take certain medicines for diabetes mellitus. Hypoglycemia can be a life-threatening condition. Symptoms of hypoglycemia (sleepiness, dizziness, and disorientation) are similar to symptoms of having too much alcohol.  If your health care provider has given you approval to drink alcohol, do so in moderation and use the following guidelines:  Women should not have more than one drink per day, and men  should not have more than two drinks per day. One drink is equal to:  12 oz of beer.  5 oz of wine.  1 oz of hard liquor.  Do not drink on an empty stomach.  Keep yourself hydrated. Have water, diet soda, or unsweetened iced tea.  Regular soda, juice, and other mixers might contain a lot of carbohydrates and should be counted. WHAT FOODS ARE NOT RECOMMENDED? As you make food choices, it is important to remember that all foods are not the same. Some foods have fewer nutrients per serving than other foods, even though they might have the same number of calories or carbohydrates. It is difficult to get your body what it needs when you eat foods with fewer nutrients. Examples of foods that you should avoid that are high in calories and carbohydrates but low in nutrients include:  Trans fats (most processed foods list trans fats on the Nutrition Facts label).  Regular soda.  Juice.  Candy.  Sweets, such as cake, pie, doughnuts, and cookies.  Fried foods. WHAT FOODS CAN I EAT? Eat nutrient-rich foods, which will nourish your body and keep you healthy. The food you should eat also will depend on several factors, including:  The calories you need.  The medicines you take.  Your weight.  Your blood glucose level.  Your blood pressure level.  Your cholesterol level. You should eat a variety of foods, including:  Protein.  Lean cuts of meat.  Proteins low in saturated fats, such as fish, egg whites, and beans. Avoid processed meats.  Fruits and vegetables.  Fruits and vegetables that may help control blood glucose levels, such as apples, mangoes, and   yams.  Dairy products.  Choose fat-free or low-fat dairy products, such as milk, yogurt, and cheese.  Grains, bread, pasta, and rice.  Choose whole grain products, such as multigrain bread, whole oats, and brown rice. These foods may help control blood pressure.  Fats.  Foods containing healthful fats, such as nuts,  avocado, olive oil, canola oil, and fish. DOES EVERYONE WITH DIABETES MELLITUS HAVE THE SAME MEAL PLAN? Because every person with diabetes mellitus is different, there is not one meal plan that works for everyone. It is very important that you meet with a dietitian who will help you create a meal plan that is just right for you.   This information is not intended to replace advice given to you by your health care provider. Make sure you discuss any questions you have with your health care provider.   Document Released: 04/10/2005 Document Revised: 08/04/2014 Document Reviewed: 06/10/2013 Elsevier Interactive Patient Education 2016 [employer] Inc[employer]  

## 2016-04-03 NOTE — Progress Notes (Signed)
Subjective:  Patient ID: Stanley Edwards, male    DOB: May 08, 1966  Age: 50 y.o. MRN: [id number]  CC: Hospitalization Follow-up and Diabetes   HPI Stanley Edwards is a 50 year old man with a history of uncontrolled Type 2 Diabetes Mellitus (A1c12.5), Hyperlipidemia, non compliance who comes into the clinic for a hospital follow up after recent hospitalization for DKA and he had admitted to running out of his medications.  He recieved IV fluids and IV  insulin which was later transitioned to subcutaneous insulin with improvement in his condition after which he was subsequently discharged to follow up her at the clinic.  He informs me today that he would like to come off Metformin "because it is not working". He has picked up all his other medications and taking them as prescribed and has no other concerns today.  Past Medical History:  Diagnosis Date  . Diabetes mellitus without complication ([place])   . Hypertension     History reviewed. No pertinent surgical history.  No Known Allergies   Outpatient Medications Prior to Visit  Medication Sig Dispense Refill  . Blood Glucose Monitoring Suppl (TRUE METRIX METER) DEVI 1 each by Does not apply route 3 (three) times daily before meals. 1 Device 0  . Blood Glucose Monitoring Suppl (TRUE METRIX METER) w/Device KIT   0  . glucose blood (TRUE METRIX BLOOD GLUCOSE TEST) test strip Use 3 times daily before meals 100 each 12  . TRUEPLUS LANCETS 28G MISC 1 each by Does not apply route 3 (three) times daily before meals. 100 each 12  . insulin aspart protamine- aspart (NOVOLOG MIX 70/30) (70-30) 100 UNIT/ML injection Inject 0.15 mLs (15 Units total) into the skin 2 (two) times daily with a meal. 10 mL 0  . atorvastatin (LIPITOR) 20 MG tablet Take 1 tablet (20 mg total) by mouth daily. (Patient not taking: Reported on 10/05/2015) 30 tablet 2  . furosemide (LASIX) 20 MG tablet Take 1 tablet (20 mg total) by mouth daily. (Patient not taking: Reported on  03/24/2016) 5 tablet 0  . Insulin Pen Needle 31G X 5 MM MISC 1 each by Does not apply route daily. (Patient not taking: Reported on 03/24/2016) 30 each 11  . metFORMIN (GLUCOPHAGE) 500 MG tablet Take 2 tablets (1,000 mg total) by mouth 2 (two) times daily with a meal. (Patient not taking: Reported on 04/03/2016) 120 tablet 3   No facility-administered medications prior to visit.     ROS Review of Systems  Constitutional: Negative for activity change and appetite change.  HENT: Negative for sinus pressure and sore throat.   Eyes: Negative for visual disturbance.  Respiratory: Negative for cough, chest tightness and shortness of breath.   Cardiovascular: Negative for chest pain and leg swelling.  Gastrointestinal: Negative for abdominal distention, abdominal pain, constipation and diarrhea.  Endocrine: Negative.   Genitourinary: Negative for dysuria.  Musculoskeletal: Negative for joint swelling and myalgias.  Skin: Negative for rash.  Allergic/Immunologic: Negative.   Neurological: Negative for weakness, light-headedness and numbness.  Psychiatric/Behavioral: Negative for dysphoric mood and suicidal ideas.    Objective:  BP 91/62 (BP Location: Right Arm, Patient Position: Sitting, Cuff Size: Large)   Pulse 97   Temp 98.7 F (37.1 C) (Oral)   Ht '6\' 3"'$  (1.905 m)   Wt 189 lb 6.4 oz (85.9 kg)   SpO2 98%   BMI 23.67 kg/m   BP/Weight 04/03/2016 03/26/2016 9/73/5329  Systolic BP 91 924 -  Diastolic BP 62  60 -  Wt. (Lbs) 189.4 - 177.47  BMI 23.67 - 22.18       Physical Exam  Constitutional: He is oriented to person, place, and time. He appears well-developed and well-nourished.  Cardiovascular: Normal rate, normal heart sounds and intact distal pulses.   No murmur heard. Pulmonary/Chest: Effort normal and breath sounds normal. He has no wheezes. He has no rales. He exhibits no tenderness.  Abdominal: Soft. Bowel sounds are normal. He exhibits no distension and no mass. There is no  tenderness.  Musculoskeletal: Normal range of motion.  Neurological: He is alert and oriented to person, place, and time.  Skin: Skin is warm and dry.     Assessment & Plan:   1. Type 2 diabetes mellitus with stage 2 chronic kidney disease, with long-term current use of insulin (HCC) Uncontrolled with A1c of 12.5, CBG of 368 NovoLog 7 units administered in the clinic and patient observed for 30 minutes with a repeat CBG performed prior to discharge Discontinue metformin as the patient request We'll substitute at next visit with glipizide if still hyperglycemic Switch from NovoLog 70/30 to Lantus 40 units at bedtime  - Glucose (CBG)  2. Hyperlipidemia Has not been compliant with his statin - atorvastatin (LIPITOR) 20 MG tablet; Take 1 tablet (20 mg total) by mouth daily.  Dispense: 30 tablet; Refill: 5  3. Noncompliance with medications Educated about implications and complications of noncompliance; complications of his diabetes discussed and he is willing to do better  4. Type 2 diabetes mellitus with hyperglycemia, with long-term current use of insulin (HCC) - Insulin Pen Needle 31G X 5 MM MISC; 1 each by Does not apply route daily.  Dispense: 30 each; Refill: 11   Meds ordered this encounter  Medications  . atorvastatin (LIPITOR) 20 MG tablet    Sig: Take 1 tablet (20 mg total) by mouth daily.    Dispense:  30 tablet    Refill:  5  . Insulin Pen Needle 31G X 5 MM MISC    Sig: 1 each by Does not apply route daily.    Dispense:  30 each    Refill:  11  . Insulin Glargine (LANTUS SOLOSTAR) 100 UNIT/ML Solostar Pen    Sig: Inject 40 Units into the skin daily at 10 pm.    Dispense:  5 pen    Refill:  1    Discontinue metformin    Follow-up: Return in about 3 weeks (around 04/24/2016) for Follow-up on diabetes mellitus.   Arnoldo Morale MD

## 2016-04-03 NOTE — Progress Notes (Signed)
Not taking metformin and doesn't want to!

## 2016-04-17 MED FILL — LANTUS SOLOSTAR 100 UNITS/M: 100 | 12 days supply | Qty: 6 | Fill #0

## 2016-04-17 MED FILL — BD PEN NDL SHORT 31GX5/16: 31G X 8 MM | 30 days supply | Qty: 100 | Fill #0

## 2016-04-17 MED FILL — BD PEN NDL SHORT 31GX5/16": 31G X 8 MM | 30 days supply | Qty: 100 | Fill #0

## 2016-04-17 MED FILL — ?ATORVASTATIN 20 MG TABLET: 20 | 30 days supply | Qty: 30 | Fill #0

## 2016-06-09 MED FILL — BD PEN NDL SHORT 31GX5/16": 31G X 8 MM | 30 days supply | Qty: 100 | Fill #1

## 2016-06-09 MED FILL — LANTUS SOLOSTAR 100 UNITS/M: 100 | 12 days supply | Qty: 6 | Fill #1

## 2016-06-09 MED FILL — BD PEN NDL SHORT 31GX5/16: 31G X 8 MM | 30 days supply | Qty: 100 | Fill #1

## 2016-07-02 MED FILL — $LANTUS SOLOSTAR 100 UNITS/: 100 | 12 days supply | Qty: 6 | Fill #2

## 2016-08-25 ENCOUNTER — Other Ambulatory Visit: Payer: Self-pay | Admitting: Pharmacist

## 2016-08-25 DIAGNOSIS — N182 Chronic kidney disease, stage 2 (mild): Principal | ICD-10-CM

## 2016-08-25 DIAGNOSIS — Z794 Long term (current) use of insulin: Principal | ICD-10-CM

## 2016-08-25 DIAGNOSIS — E1165 Type 2 diabetes mellitus with hyperglycemia: Secondary | ICD-10-CM

## 2016-08-25 DIAGNOSIS — E1122 Type 2 diabetes mellitus with diabetic chronic kidney disease: Secondary | ICD-10-CM

## 2016-08-25 DIAGNOSIS — Z9114 Patient's other noncompliance with medication regimen: Secondary | ICD-10-CM

## 2016-08-25 DIAGNOSIS — E785 Hyperlipidemia, unspecified: Secondary | ICD-10-CM

## 2016-08-25 MED ORDER — INSULIN GLARGINE 100 UNIT/ML SOLOSTAR PEN: 40.0000 [IU] | PEN_INJECTOR | Freq: Every day | SUBCUTANEOUS | 0 refills | Status: DC

## 2016-08-25 MED FILL — !LANTUS 100 UNITS/ML VIAL: 100 | 25 days supply | Qty: 10 | Fill #0

## 2016-09-01 ENCOUNTER — Ambulatory Visit: Payer: Self-pay | Admitting: Family Medicine

## 2016-12-08 ENCOUNTER — Ambulatory Visit: Payer: Self-pay | Admitting: Family Medicine

## 2016-12-12 ENCOUNTER — Ambulatory Visit: Payer: Self-pay | Admitting: Family Medicine

## 2017-01-01 ENCOUNTER — Ambulatory Visit (INDEPENDENT_AMBULATORY_CARE_PROVIDER_SITE_OTHER): Payer: BLUE CROSS/BLUE SHIELD | Admitting: Family Medicine

## 2017-01-01 ENCOUNTER — Encounter: Payer: Self-pay | Admitting: Family Medicine

## 2017-01-01 VITALS — BP 120/77 | HR 101 | Temp 98.4°F | Resp 18 | Ht 74.61 in | Wt 193.8 lb

## 2017-01-01 DIAGNOSIS — N179 Acute kidney failure, unspecified: Secondary | ICD-10-CM | POA: Diagnosis not present

## 2017-01-01 DIAGNOSIS — E1110 Type 2 diabetes mellitus with ketoacidosis without coma: Secondary | ICD-10-CM | POA: Diagnosis not present

## 2017-01-01 DIAGNOSIS — N182 Chronic kidney disease, stage 2 (mild): Secondary | ICD-10-CM

## 2017-01-01 DIAGNOSIS — E1122 Type 2 diabetes mellitus with diabetic chronic kidney disease: Secondary | ICD-10-CM

## 2017-01-01 DIAGNOSIS — Z5181 Encounter for therapeutic drug level monitoring: Secondary | ICD-10-CM

## 2017-01-01 DIAGNOSIS — E785 Hyperlipidemia, unspecified: Secondary | ICD-10-CM

## 2017-01-01 DIAGNOSIS — R5383 Other fatigue: Secondary | ICD-10-CM

## 2017-01-01 DIAGNOSIS — Z9114 Patient's other noncompliance with medication regimen: Secondary | ICD-10-CM | POA: Diagnosis not present

## 2017-01-01 DIAGNOSIS — I10 Essential (primary) hypertension: Secondary | ICD-10-CM

## 2017-01-01 DIAGNOSIS — D649 Anemia, unspecified: Secondary | ICD-10-CM | POA: Diagnosis not present

## 2017-01-01 DIAGNOSIS — E7800 Pure hypercholesterolemia, unspecified: Secondary | ICD-10-CM | POA: Diagnosis not present

## 2017-01-01 DIAGNOSIS — Z794 Long term (current) use of insulin: Secondary | ICD-10-CM

## 2017-01-01 DIAGNOSIS — E1165 Type 2 diabetes mellitus with hyperglycemia: Secondary | ICD-10-CM

## 2017-01-01 LAB — POCT URINALYSIS DIP (MANUAL ENTRY)
Glucose, UA: 500 mg/dL — AB
Leukocytes, UA: NEGATIVE
Nitrite, UA: NEGATIVE
Protein Ur, POC: 30 mg/dL — AB
Spec Grav, UA: >=1.03 — AB (ref 1.010–1.025)
Urobilinogen, UA: 0.2 E.U./dL
pH, UA: 5.5 (ref 5.0–8.0)

## 2017-01-01 LAB — POCT CBC
Granulocyte percent: 60.7 %G (ref 37–80)
HCT, POC: 41.9 % — AB (ref 43.5–53.7)
Hemoglobin: 14.5 g/dL (ref 14.1–18.1)
Lymph, poc: 1.3 (ref 0.6–3.4)
MCH, POC: 33.7 pg — AB (ref 27–31.2)
MCHC: 34.5 g/dL (ref 31.8–35.4)
MCV: 97.9 fL — AB (ref 80–97)
MID (cbc): 0.3 (ref 0–0.9)
MPV: 8.1 fL (ref 0–99.8)
POC Granulocyte: 2.4 (ref 2–6.9)
POC LYMPH PERCENT: 31.9 %L (ref 10–50)
POC MID %: 7.4 %M (ref 0–12)
Platelet Count, POC: 324 10*3/uL (ref 142–424)
RBC: 4.28 M/uL — AB (ref 4.69–6.13)
RDW, POC: 11.5 %
WBC: 4 10*3/uL — AB (ref 4.6–10.2)

## 2017-01-01 LAB — COMPREHENSIVE METABOLIC PANEL
ALT: 15 IU/L (ref 0–44)
AST: 13 IU/L (ref 0–40)
Albumin/Globulin Ratio: 1.3 (ref 1.2–2.2)
Albumin: 4.4 g/dL (ref 3.5–5.5)
Alkaline Phosphatase: 133 IU/L — ABNORMAL HIGH (ref 39–117)
BUN/Creatinine Ratio: 17 (ref 9–20)
BUN: 21 mg/dL (ref 6–24)
Bilirubin Total: 1 mg/dL (ref 0.0–1.2)
CO2: 16 mmol/L — ABNORMAL LOW (ref 18–29)
Calcium: 9.7 mg/dL (ref 8.7–10.2)
Chloride: 100 mmol/L (ref 96–106)
Creatinine, Ser: 1.25 mg/dL (ref 0.76–1.27)
GFR calc Af Amer: 77 mL/min/{1.73_m2} (ref >59)
GFR calc non Af Amer: 66 mL/min/{1.73_m2} (ref >59)
Globulin, Total: 3.4 g/dL (ref 1.5–4.5)
Glucose: 332 mg/dL — ABNORMAL HIGH (ref 65–99)
Potassium: 4.4 mmol/L (ref 3.5–5.2)
Sodium: 136 mmol/L (ref 134–144)
Total Protein: 7.8 g/dL (ref 6.0–8.5)

## 2017-01-01 LAB — GLUCOSE, POCT (MANUAL RESULT ENTRY)
POC Glucose: 150 mg/dl — AB (ref 70–99)
POC Glucose: 262 mg/dl — AB (ref 70–99)

## 2017-01-01 LAB — POCT GLYCOSYLATED HEMOGLOBIN (HGB A1C): Hemoglobin A1C: 11.2

## 2017-01-01 MED ORDER — ATORVASTATIN CALCIUM 20 MG PO TABS: 20.0000 mg | ORAL_TABLET | Freq: Every day | ORAL | 1 refills | Status: DC

## 2017-01-01 MED ORDER — GLUCOSE BLOOD VI STRP: ORAL_STRIP | 12 refills | Status: DC

## 2017-01-01 MED ORDER — TRUEPLUS LANCETS 28G MISC
1.0000 | Freq: Three times a day (TID) | 12 refills | Status: DC
Start: 2017-01-01 — End: 2019-03-24

## 2017-01-01 MED ORDER — INSULIN PEN NEEDLE 31G X 5 MM MISC: 1.0000 | Freq: Every day | 11 refills | Status: DC

## 2017-01-01 MED ORDER — INSULIN GLARGINE 100 UNIT/ML SOLOSTAR PEN: 40.0000 [IU] | PEN_INJECTOR | Freq: Every day | SUBCUTANEOUS | 1 refills | Status: DC

## 2017-01-01 NOTE — Patient Instructions (Addendum)
IF you received an x-ray today, you will receive an invoice from [facility]. Please contact [facility] at [phone] with questions or concerns regarding your invoice.   IF you received labwork today, you will receive an invoice from [place]. Please contact [facility] at [phone] with questions or concerns regarding your invoice.   Our billing staff will not be able to assist you with questions regarding bills from these companies.  You will be contacted with the lab results as soon as they are available. The fastest way to get your results is to activate your My Chart account. Instructions are located on the last page of this paperwork. If you have not heard from [place] regarding the results in 2 weeks, please contact this office.      Blood Glucose Monitoring, Adult Monitoring your blood sugar (glucose) helps you manage your diabetes. It also helps you and your health care provider determine how well your diabetes management plan is working. Blood glucose monitoring involves checking your blood glucose as often as directed, and keeping a record (log) of your results over time. Why should I monitor my blood glucose? Checking your blood glucose regularly can:  Help you understand how food, exercise, illnesses, and medicines affect your blood glucose.  Let you know what your blood glucose is at any time. You can quickly tell if you are having low blood glucose (hypoglycemia) or high blood glucose (hyperglycemia).  Help you and your health care provider adjust your medicines as needed.  When should I check my blood glucose? Follow instructions from your health care provider about how often to check your blood glucose. This may depend on:  The type of diabetes you have.  How well-controlled your diabetes is.  Medicines you are taking.  If you have type 1 diabetes:  Check your blood glucose at least 2 times a day.  Also check your blood glucose: ? Before  every insulin injection. ? Before and after exercise. ? Between meals. ? 2 hours after a meal. ? Occasionally between 2:00 a.m. and 3:00 a.m., as directed. ? Before potentially dangerous tasks, like driving or using heavy machinery. ? At bedtime.  You may need to check your blood glucose more often, up to 6-10 times a day: ? If you use an insulin pump. ? If you need multiple daily injections (MDI). ? If your diabetes is not well-controlled. ? If you are ill. ? If you have a history of severe hypoglycemia. ? If you have a history of not knowing when your blood glucose is getting low (hypoglycemia unawareness). If you have type 2 diabetes:  If you take insulin or other diabetes medicines, check your blood glucose at least 2 times a day.  If you are on intensive insulin therapy, check your blood glucose at least 4 times a day. Occasionally, you may also need to check between 2:00 a.m. and 3:00 a.m., as directed.  Also check your blood glucose: ? Before and after exercise. ? Before potentially dangerous tasks, like driving or using heavy machinery.  You may need to check your blood glucose more often if: ? Your medicine is being adjusted. ? Your diabetes is not well-controlled. ? You are ill. What is a blood glucose log?  A blood glucose log is a record of your blood glucose readings. It helps you and your health care provider: ? Look for patterns in your blood glucose over time. ? Adjust your diabetes management plan as needed.  Every time you  check your blood glucose, write down your result and notes about things that may be affecting your blood glucose, such as your diet and exercise for the day.  Most glucose meters store a record of glucose readings in the meter. Some meters allow you to download your records to a computer. How do I check my blood glucose? Follow these steps to get accurate readings of your blood glucose: Supplies needed   Blood glucose meter.  Test  strips for your meter. Each meter has its own strips. You must use the strips that come with your meter.  A needle to prick your finger (lancet). Do not use lancets more than once.  A device that holds the lancet (lancing device).  A journal or log book to write down your results. Procedure  Wash your hands with soap and water.  Prick the side of your finger (not the tip) with the lancet. Use a different finger each time.  Gently rub the finger until a small drop of blood appears.  Follow instructions that come with your meter for inserting the test strip, applying blood to the strip, and using your blood glucose meter.  Write down your result and any notes. Alternative testing sites  Some meters allow you to use areas of your body other than your finger (alternative sites) to test your blood.  If you think you may have hypoglycemia, or if you have hypoglycemia unawareness, do not use alternative sites. Use your finger instead.  Alternative sites may not be as accurate as the fingers, because blood flow is slower in these areas. This means that the result you get may be delayed, and it may be different from the result that you would get from your finger.  The most common alternative sites are: ? Forearm. ? Thigh. ? Palm of the hand. Additional tips  Always keep your supplies with you.  If you have questions or need help, all blood glucose meters have a 24-hour "hotline" number that you can call. You may also contact your health care provider.  After you use a few boxes of test strips, adjust (calibrate) your blood glucose meter by following instructions that came with your meter. This information is not intended to replace advice given to you by your health care provider. Make sure you discuss any questions you have with your health care provider. Document Released: 07/17/2003 Document Revised: 02/01/2016 Document Reviewed: 12/24/2015 Elsevier Interactive Patient Education   2017 [employer].  Diabetes Mellitus and Standards of Medical Care Managing diabetes (diabetes mellitus) can be complicated. Your diabetes treatment may be managed by a team of health care providers, including:  A diet and nutrition specialist (registered dietitian).  A nurse.  A certified diabetes educator (CDE).  A diabetes specialist (endocrinologist).  An eye doctor.  A primary care provider.  A dentist.  Your health care providers follow a schedule in order to help you get the best quality of care. The following schedule is a general guideline for your diabetes management plan. Your health care providers may also give you more specific instructions. HbA1c ( hemoglobin A1c) test This test provides information about blood sugar (glucose) control over the previous 2-3 months. It is used to check whether your diabetes management plan needs to be adjusted.  If you are meeting your treatment goals, this test is done at least 2 times a year.  If you are not meeting treatment goals or if your treatment goals have changed, this test is done 4 times  a year.  Blood pressure test  This test is done at every routine medical visit. For most people, the goal is less than 130/80. Ask your health care provider what your goal blood pressure should be. Dental and eye exams  Visit your dentist two times a year.  If you have type 1 diabetes, get an eye exam 3-5 years after you are diagnosed, and then once a year after your first exam. ? If you were diagnosed with type 1 diabetes as a child, get an eye exam when you are age 2 or older and have had diabetes for 3-5 years. After the first exam, you should get an eye exam once a year.  If you have type 2 diabetes, have an eye exam as soon as you are diagnosed, and then once a year after your first exam. Foot care exam  Visual foot exams are done at every routine medical visit. The exams check for cuts, bruises, redness, blisters, sores, or  other problems with the feet.  A complete foot exam is done by your health care provider once a year. This exam includes an inspection of the structure and skin of your feet, and a check of the pulses and sensation in your feet. ? Type 1 diabetes: Get your first exam 3-5 years after diagnosis. ? Type 2 diabetes: Get your first exam as soon as you are diagnosed.  Check your feet every day for cuts, bruises, redness, blisters, or sores. If you have any of these or other problems that are not healing, contact your health care provider. Kidney function test ( urine microalbumin)  This test is done once a year. ? Type 1 diabetes: Get your first test 5 years after diagnosis. ? Type 2 diabetes: Get your first test as soon as you are diagnosed.  If you have chronic kidney disease (CKD), get a serum creatinine and estimated glomerular filtration rate (eGFR) test once a year. Lipid profile (cholesterol, HDL, LDL, triglycerides)  This test should be done when you are diagnosed with diabetes, and every 5 years after the first test. If you are on medicines to lower your cholesterol, you may need to get this test done every year. ? The goal for LDL is less than 100 mg/dL (5.5 mmol/L). If you are at high risk, the goal is less than 70 mg/dL (3.9 mmol/L). ? The goal for HDL is 40 mg/dL (2.2 mmol/L) for men and 50 mg/dL(2.8 mmol/L) for women. An HDL cholesterol of 60 mg/dL (3.3 mmol/L) or higher gives some protection against heart disease. ? The goal for triglycerides is less than 150 mg/dL (8.3 mmol/L). Immunizations  The yearly flu (influenza) vaccine is recommended for everyone 6 months or older who has diabetes.  The pneumonia (pneumococcal) vaccine is recommended for everyone 2 years or older who has diabetes. If you are 1 or older, you may get the pneumonia vaccine as a series of two separate shots.  The hepatitis B vaccine is recommended for adults shortly after they have been diagnosed with  diabetes.  The Tdap (tetanus, diphtheria, and pertussis) vaccine should be given: ? According to normal childhood vaccination schedules, for children. ? Every 10 years, for adults who have diabetes.  The shingles vaccine is recommended for people who have had chicken pox and are 50 years or older. Mental and emotional health  Screening for symptoms of eating disorders, anxiety, and depression is recommended at the time of diagnosis and afterward as needed. If your screening shows that you  have symptoms (you have a positive screening result), you may need further evaluation and be referred to a mental health care provider. Diabetes self-management education  Education about how to manage your diabetes is recommended at diagnosis and ongoing as needed. Treatment plan  Your treatment plan will be reviewed at every medical visit. Summary  Managing diabetes (diabetes mellitus) can be complicated. Your diabetes treatment may be managed by a team of health care providers.  Your health care providers follow a schedule in order to help you get the best quality of care.  Standards of care including having regular physical exams, blood tests, blood pressure monitoring, immunizations, screening tests, and education about how to manage your diabetes.  Your health care providers may also give you more specific instructions based on your individual health. This information is not intended to replace advice given to you by your health care provider. Make sure you discuss any questions you have with your health care provider. Document Released: 05/11/2009 Document Revised: 04/11/2016 Document Reviewed: 04/11/2016 Elsevier Interactive Patient Education  2018 [employer]. Diabetes Mellitus and Food It is important for you to manage your blood sugar (glucose) level. Your blood glucose level can be greatly affected by what you eat. Eating healthier foods in the appropriate amounts throughout the day at  about the same time each day will help you control your blood glucose level. It can also help slow or prevent worsening of your diabetes mellitus. Healthy eating may even help you improve the level of your blood pressure and reach or maintain a healthy weight. General recommendations for healthful eating and cooking habits include:  Eating meals and snacks regularly. Avoid going long periods of time without eating to lose weight.  Eating a diet that consists mainly of plant-based foods, such as fruits, vegetables, nuts, legumes, and whole grains.  Using low-heat cooking methods, such as baking, instead of high-heat cooking methods, such as deep frying.  Work with your dietitian to make sure you understand how to use the Nutrition Facts information on food labels. How can food affect me? Carbohydrates Carbohydrates affect your blood glucose level more than any other type of food. Your dietitian will help you determine how many carbohydrates to eat at each meal and teach you how to count carbohydrates. Counting carbohydrates is important to keep your blood glucose at a healthy level, especially if you are using insulin or taking certain medicines for diabetes mellitus. Alcohol Alcohol can cause sudden decreases in blood glucose (hypoglycemia), especially if you use insulin or take certain medicines for diabetes mellitus. Hypoglycemia can be a life-threatening condition. Symptoms of hypoglycemia (sleepiness, dizziness, and disorientation) are similar to symptoms of having too much alcohol. If your health care provider has given you approval to drink alcohol, do so in moderation and use the following guidelines:  Women should not have more than one drink per day, and men should not have more than two drinks per day. One drink is equal to: ? 12 oz of beer. ? 5 oz of wine. ? 1 oz of hard liquor.  Do not drink on an empty stomach.  Keep yourself hydrated. Have water, diet soda, or unsweetened iced  tea.  Regular soda, juice, and other mixers might contain a lot of carbohydrates and should be counted.  What foods are not recommended? As you make food choices, it is important to remember that all foods are not the same. Some foods have fewer nutrients per serving than other foods, even though they might  have the same number of calories or carbohydrates. It is difficult to get your body what it needs when you eat foods with fewer nutrients. Examples of foods that you should avoid that are high in calories and carbohydrates but low in nutrients include:  Trans fats (most processed foods list trans fats on the Nutrition Facts label).  Regular soda.  Juice.  Candy.  Sweets, such as cake, pie, doughnuts, and cookies.  Fried foods.  What foods can I eat? Eat nutrient-rich foods, which will nourish your body and keep you healthy. The food you should eat also will depend on several factors, including:  The calories you need.  The medicines you take.  Your weight.  Your blood glucose level.  Your blood pressure level.  Your cholesterol level.  You should eat a variety of foods, including:  Protein. ? Lean cuts of meat. ? Proteins low in saturated fats, such as fish, egg whites, and beans. Avoid processed meats.  Fruits and vegetables. ? Fruits and vegetables that may help control blood glucose levels, such as apples, mangoes, and yams.  Dairy products. ? Choose fat-free or low-fat dairy products, such as milk, yogurt, and cheese.  Grains, bread, pasta, and rice. ? Choose whole grain products, such as multigrain bread, whole oats, and brown rice. These foods may help control blood pressure.  Fats. ? Foods containing healthful fats, such as nuts, avocado, olive oil, canola oil, and fish.  Does everyone with diabetes mellitus have the same meal plan? Because every person with diabetes mellitus is different, there is not one meal plan that works for everyone. It is very  important that you meet with a dietitian who will help you create a meal plan that is just right for you. This information is not intended to replace advice given to you by your health care provider. Make sure you discuss any questions you have with your health care provider. Document Released: 04/10/2005 Document Revised: 12/20/2015 Document Reviewed: 06/10/2013 Elsevier Interactive Patient Education  2017 [employer].

## 2017-01-01 NOTE — Progress Notes (Addendum)
Subjective:    Patient ID: Stanley Edwards, male    DOB: [birth year], 51 y.o.   MRN: [id number] Chief Complaint  Patient presents with  . Medication Refill    Lantus     HPI Has been feeling fatigued for several weeks with no vision or urine changes. Variable appetite. + polydypsia but no polyuria. No myalgais, no arthralgias, spasms  H/o AKI: Last Cr9 mos prior  1.04, eGFR >60  DM:  Diagnosed 2 years ago and he immediately went on to insulin as couldn't tolerate metformin. 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 Is wondering if this is a possibility. Lab Results  Component Value Date   HGBA1C 12.5 (H) 03/25/2016   HGBA1C 12.1 (H) 09/18/2015   Past Medical History:  Diagnosis Date  . Allergy   . Diabetes mellitus without complication ([place])   . Hypertension    History reviewed. No pertinent surgical history. Current Outpatient Prescriptions on File Prior to Visit  Medication Sig Dispense Refill  . Blood Glucose Monitoring Suppl (TRUE METRIX METER) DEVI 1 each by Does not apply route 3 (three) times daily before meals. 1 Device 0  . Blood Glucose Monitoring Suppl (TRUE METRIX METER) w/Device KIT   0   No current facility-administered medications on file prior to visit.    No Known Allergies Family History  Problem Relation Age of Onset  . Cancer Mother   . Diabetes Father    Social History   Social History  . Marital status: Single    Spouse name: N/A  . Number of children: N/A  . Years of education: N/A   Social History Main Topics  . Smoking status: Former Smoker    Types: Cigarettes  . Smokeless tobacco: Never Used  . Alcohol use 0.6 oz/week    1 Cans of beer per week  . Drug use: No  . Sexual activity: Yes   Other Topics Concern  . None   Social History Narrative  . None   Depression screen [facility] 2/9 01/01/2017 04/03/2016 10/05/2015 09/21/2015   Decreased Interest 0 3 0 0  Down, Depressed, Hopeless 0 0 0 0  PHQ - 2 Score 0 3 0 0  Altered sleeping - 2 - -  Tired, decreased energy - 1 - -  Change in appetite - 0 - -  Feeling bad or failure about yourself  - 3 - -  Trouble concentrating - 0 - -  Moving slowly or fidgety/restless - 0 - -  Suicidal thoughts - 0 - -  PHQ-9 Score - 9 - -     Review of Systems See hpi    Objective:   Physical Exam  Constitutional: He is oriented to person, place, and time. He appears well-developed and well-nourished. No distress.  HENT:  Head: Normocephalic and atraumatic.  Eyes: Conjunctivae are normal. Pupils are equal, round, and reactive to light. No scleral icterus.  Neck: Normal range of motion. Neck supple. No thyromegaly present.  Cardiovascular: Normal rate, regular rhythm, normal heart sounds and intact distal pulses.   Pulmonary/Chest: Effort normal and breath sounds normal. No respiratory distress.  Musculoskeletal: He exhibits no edema.  Lymphadenopathy:    He has no cervical adenopathy.  Neurological: He is alert and oriented to person, place, and time.  Skin: Skin is warm and dry. He is not diaphoretic.  Psychiatric: He  has a normal mood and affect. His behavior is normal.   Diabetic Foot Exam - Simple   Simple Foot Form Visual Inspection No deformities, no ulcerations, no other skin breakdown bilaterally:  Yes Sensation Testing Intact to touch and monofilament testing bilaterally:  Yes Pulse Check Posterior Tibialis and Dorsalis pulse intact bilaterally:  Yes Comments      BP 120/77   Pulse (!) 118   Temp 98.4 F (36.9 C) (Oral)   Resp 18   Ht 6' 2.61" (1.895 m)   Wt 193 lb 12.8 oz (87.9 kg)   SpO2 97%   BMI 24.48 kg/m  Results for orders placed or performed in visit on 01/01/17  POCT glucose (manual entry)  Result Value Ref Range   POC Glucose 150 (A) 70 - 99 mg/dl  POCT urinalysis dipstick  Result Value Ref Range   Color, UA yellow yellow   Clarity,  UA clear clear   Glucose, UA =500 (A) negative mg/dL   Bilirubin, UA small (A) negative   Ketones, POC UA >= (160) (A) negative mg/dL   Spec Grav, UA >=1.030 (A) 1.010 - 1.025   Blood, UA trace-intact (A) negative   pH, UA 5.5 5.0 - 8.0   Protein Ur, POC =30 (A) negative mg/dL   Urobilinogen, UA 0.2 0.2 or 1.0 E.U./dL   Nitrite, UA Negative Negative   Leukocytes, UA Negative Negative    EKG: NSR, no acute ischemic changes noted. Improved compared to prior EKG done 03/24/16.   I have personally reviewed the EKG tracing and agree with the computer interpretation with the exception.     Assessment & Plan:  Explained patient that if he wants have any chance of getting off of insulin he needs to start insulin to get sugars under ideal control with thisASAP and after his body is used to functioning back in her normal state we may be able to better assess his remaining endogenous pancreatic function and add in other agents while slowly backing off of insulin to see how he responds. Patient appreciates explanation and readily agrees to increase med compliance with very close follow-up. . . . 1. Anemia, unspecified type   2. Noncompliance with medications   3. Type 2 diabetes mellitus with stage 2 chronic kidney disease, with long-term current use of insulin ([place])   4. Diabetic ketoacidosis without coma associated with type 2 diabetes mellitus ([place])   5. AKI (acute kidney injury) ([place])   6. Pure hypercholesterolemia   7. Essential hypertension   8. Other fatigue   9. Medication monitoring encounter   10. Type 2 diabetes mellitus with hyperglycemia, with long-term current use of insulin ([facility])   11. Hyperlipidemia, unspecified hyperlipidemia type     Orders Placed This Encounter  Procedures  . Comprehensive metabolic panel  . Microalbumin/Creatinine Ratio, Urine  . POCT glycosylated hemoglobin (Hb A1C)  . POCT glucose (manual entry)  . POCT urinalysis dipstick  . POCT CBC  . POCT glucose  (manual entry)  . EKG 12-Lead  . HM Diabetes Foot Exam  . Insert peripheral IV    Meds ordered this encounter  Medications  . glucose blood (TRUE METRIX BLOOD GLUCOSE TEST) test strip    Sig: Use 3 times daily before meals    Dispense:  100 each    Refill:  12  . Insulin Glargine (LANTUS SOLOSTAR) 100 UNIT/ML Solostar Pen    Sig: Inject 40 Units into the skin daily at 10 pm.    Dispense:  15 pen    Refill:  1  . atorvastatin (LIPITOR) 20 MG tablet    Sig: Take 1 tablet (20 mg total) by mouth daily.    Dispense:  90 tablet    Refill:  1  . Insulin Pen Needle 31G X 5 MM MISC    Sig: 1 each by Does not apply route daily.    Dispense:  30 each    Refill:  11  . TRUEPLUS LANCETS 28G MISC    Sig: 1 each by Does not apply route 3 (three) times daily before meals.    Dispense:  100 each    Refill:  12    Delman Cheadle, M.D.  Primary Care at [facility] [address] [place], [place] [address] [phone] phone [phone] fax  01/03/17 10:06 PM

## 2017-01-02 LAB — MICROALBUMIN / CREATININE URINE RATIO
Creatinine, Urine: 78.6 mg/dL
Microalb/Creat Ratio: 34.1 mg/g creat — ABNORMAL HIGH (ref 0.0–30.0)
Microalbumin, Urine: 26.8 ug/mL

## 2017-03-22 ENCOUNTER — Other Ambulatory Visit: Payer: Self-pay | Admitting: Family Medicine

## 2017-03-22 DIAGNOSIS — Z91148 Patient's other noncompliance with medication regimen for other reason: Secondary | ICD-10-CM

## 2017-03-22 DIAGNOSIS — Z9114 Patient's other noncompliance with medication regimen: Secondary | ICD-10-CM

## 2017-03-22 DIAGNOSIS — E1165 Type 2 diabetes mellitus with hyperglycemia: Secondary | ICD-10-CM

## 2017-03-22 DIAGNOSIS — N182 Chronic kidney disease, stage 2 (mild): Principal | ICD-10-CM

## 2017-03-22 DIAGNOSIS — E785 Hyperlipidemia, unspecified: Secondary | ICD-10-CM

## 2017-03-22 DIAGNOSIS — Z794 Long term (current) use of insulin: Principal | ICD-10-CM

## 2017-03-22 DIAGNOSIS — E1122 Type 2 diabetes mellitus with diabetic chronic kidney disease: Secondary | ICD-10-CM

## 2017-04-11 ENCOUNTER — Other Ambulatory Visit: Payer: Self-pay | Admitting: Family Medicine

## 2017-04-11 DIAGNOSIS — E1165 Type 2 diabetes mellitus with hyperglycemia: Secondary | ICD-10-CM

## 2017-04-11 DIAGNOSIS — Z9114 Patient's other noncompliance with medication regimen: Secondary | ICD-10-CM

## 2017-04-11 DIAGNOSIS — N182 Chronic kidney disease, stage 2 (mild): Principal | ICD-10-CM

## 2017-04-11 DIAGNOSIS — Z794 Long term (current) use of insulin: Principal | ICD-10-CM

## 2017-04-11 DIAGNOSIS — E785 Hyperlipidemia, unspecified: Secondary | ICD-10-CM

## 2017-04-11 DIAGNOSIS — E1122 Type 2 diabetes mellitus with diabetic chronic kidney disease: Secondary | ICD-10-CM

## 2017-04-12 MED ORDER — LISINOPRIL 5 MG PO TABS: 5.0000 mg | ORAL_TABLET | Freq: Every day | ORAL | 0 refills | Status: DC

## 2017-04-12 NOTE — Telephone Encounter (Signed)
Pt is needs a f/u OV with FASTING labs. Please have him sched an appt within the next 1-2 wks so we can continue to make sure the medicines are keeping him healthy and safe before we refill them further. Thanks.

## 2017-08-12 ENCOUNTER — Other Ambulatory Visit: Payer: Self-pay | Admitting: Family Medicine

## 2017-08-12 DIAGNOSIS — Z794 Long term (current) use of insulin: Principal | ICD-10-CM

## 2017-08-12 DIAGNOSIS — E1165 Type 2 diabetes mellitus with hyperglycemia: Secondary | ICD-10-CM

## 2017-08-12 DIAGNOSIS — E785 Hyperlipidemia, unspecified: Secondary | ICD-10-CM

## 2017-08-12 DIAGNOSIS — N182 Chronic kidney disease, stage 2 (mild): Principal | ICD-10-CM

## 2017-08-12 DIAGNOSIS — Z9114 Patient's other noncompliance with medication regimen: Secondary | ICD-10-CM

## 2017-08-12 DIAGNOSIS — E1122 Type 2 diabetes mellitus with diabetic chronic kidney disease: Secondary | ICD-10-CM

## 2017-08-12 DIAGNOSIS — Z91148 Patient's other noncompliance with medication regimen for other reason: Secondary | ICD-10-CM

## 2017-08-12 NOTE — Telephone Encounter (Signed)
Needs office visit.  Last OV was 01/01/17.   Dr. Clelia CroftShaw mentioned that pt needed a follow up  Visit back in Sept.   Don't see where pt has made a follow up appt.

## 2017-08-17 NOTE — Telephone Encounter (Signed)
Left a detailed message prescription will be sent in, but he needs a follow for additional refills.

## 2017-09-28 ENCOUNTER — Other Ambulatory Visit: Payer: Self-pay | Admitting: Family Medicine

## 2017-09-28 DIAGNOSIS — N182 Chronic kidney disease, stage 2 (mild): Principal | ICD-10-CM

## 2017-09-28 DIAGNOSIS — E785 Hyperlipidemia, unspecified: Secondary | ICD-10-CM

## 2017-09-28 DIAGNOSIS — Z794 Long term (current) use of insulin: Principal | ICD-10-CM

## 2017-09-28 DIAGNOSIS — Z91148 Patient's other noncompliance with medication regimen for other reason: Secondary | ICD-10-CM

## 2017-09-28 DIAGNOSIS — E1122 Type 2 diabetes mellitus with diabetic chronic kidney disease: Secondary | ICD-10-CM

## 2017-09-28 DIAGNOSIS — E1165 Type 2 diabetes mellitus with hyperglycemia: Secondary | ICD-10-CM

## 2017-09-28 DIAGNOSIS — Z9114 Patient's other noncompliance with medication regimen: Secondary | ICD-10-CM

## 2017-11-08 ENCOUNTER — Other Ambulatory Visit: Payer: Self-pay | Admitting: Family Medicine

## 2017-11-08 DIAGNOSIS — E1122 Type 2 diabetes mellitus with diabetic chronic kidney disease: Secondary | ICD-10-CM

## 2017-11-08 DIAGNOSIS — E1165 Type 2 diabetes mellitus with hyperglycemia: Secondary | ICD-10-CM

## 2017-11-08 DIAGNOSIS — N182 Chronic kidney disease, stage 2 (mild): Principal | ICD-10-CM

## 2017-11-08 DIAGNOSIS — E785 Hyperlipidemia, unspecified: Secondary | ICD-10-CM

## 2017-11-08 DIAGNOSIS — Z794 Long term (current) use of insulin: Principal | ICD-10-CM

## 2017-11-08 DIAGNOSIS — Z9114 Patient's other noncompliance with medication regimen: Secondary | ICD-10-CM

## 2017-11-09 NOTE — Telephone Encounter (Signed)
Patient called, left detailed VM to call the office to schedule an appointment before any more refills, last OV 12/2016 and was supposed to be seen 03/2017.

## 2018-01-02 ENCOUNTER — Encounter: Payer: BLUE CROSS/BLUE SHIELD | Admitting: Family Medicine

## 2018-01-07 ENCOUNTER — Telehealth: Payer: Self-pay | Admitting: Family Medicine

## 2018-01-07 DIAGNOSIS — E1165 Type 2 diabetes mellitus with hyperglycemia: Secondary | ICD-10-CM

## 2018-01-07 DIAGNOSIS — Z794 Long term (current) use of insulin: Principal | ICD-10-CM

## 2018-01-07 DIAGNOSIS — E1122 Type 2 diabetes mellitus with diabetic chronic kidney disease: Secondary | ICD-10-CM

## 2018-01-07 DIAGNOSIS — E785 Hyperlipidemia, unspecified: Secondary | ICD-10-CM

## 2018-01-07 DIAGNOSIS — Z9114 Patient's other noncompliance with medication regimen: Secondary | ICD-10-CM

## 2018-01-07 DIAGNOSIS — N182 Chronic kidney disease, stage 2 (mild): Principal | ICD-10-CM

## 2018-01-07 NOTE — Telephone Encounter (Signed)
Pt has appt scheduled for 01/23/18. Pt was given one refill of medication on 11/13/17. Can pt have additional refill until appt on 01/23/18?  Lantus Solostar refill Last Refill:11/13/17 Last OV: 01/01/17 PCP: Dr. Clelia CroftShaw [facility]

## 2018-01-07 NOTE — Telephone Encounter (Signed)
Copied from CRM [id number]. Topic: Quick Communication - Rx Refill/Question >> Jan 07, 2018 11:51 AM Alexander BergeronBarksdale, Harvey B wrote: One month supply   Medication: LANTUS SOLOSTAR 100 UNIT/ML Solostar Pen [[id number]][[id number]]    Has the patient contacted their pharmacy? Yes.   (Agent: If no, request that the patient contact the pharmacy for the refill.) (Agent: If yes, when and what did the pharmacy advise?)  Preferred Pharmacy (with phone number or street name): [facility]  Agent: Please be advised that RX refills may take up to 3 business days. We ask that you follow-up with your pharmacy.

## 2018-01-13 ENCOUNTER — Other Ambulatory Visit: Payer: Self-pay | Admitting: Family Medicine

## 2018-01-13 ENCOUNTER — Other Ambulatory Visit: Payer: Self-pay

## 2018-01-13 DIAGNOSIS — E1122 Type 2 diabetes mellitus with diabetic chronic kidney disease: Secondary | ICD-10-CM

## 2018-01-13 DIAGNOSIS — E1165 Type 2 diabetes mellitus with hyperglycemia: Secondary | ICD-10-CM

## 2018-01-13 DIAGNOSIS — Z794 Long term (current) use of insulin: Principal | ICD-10-CM

## 2018-01-13 DIAGNOSIS — Z91148 Patient's other noncompliance with medication regimen for other reason: Secondary | ICD-10-CM

## 2018-01-13 DIAGNOSIS — Z9114 Patient's other noncompliance with medication regimen: Secondary | ICD-10-CM

## 2018-01-13 DIAGNOSIS — N182 Chronic kidney disease, stage 2 (mild): Principal | ICD-10-CM

## 2018-01-13 DIAGNOSIS — E785 Hyperlipidemia, unspecified: Secondary | ICD-10-CM

## 2018-01-13 MED ORDER — INSULIN GLARGINE 100 UNIT/ML SOLOSTAR PEN: 40.0000 [IU] | PEN_INJECTOR | Freq: Every day | SUBCUTANEOUS | 0 refills | Status: DC

## 2018-01-13 NOTE — Telephone Encounter (Signed)
Patient called again requesting refill for Lantus Solostar.  He has an appointment schedule on 01/23/18, but still needs some meds until then.  He is all out.

## 2018-01-13 NOTE — Telephone Encounter (Signed)
Refill sent to last pt until appt 01/23/2018 - with message he must keep appt.

## 2018-01-23 ENCOUNTER — Encounter: Payer: Self-pay | Admitting: Family Medicine

## 2018-01-23 ENCOUNTER — Ambulatory Visit (INDEPENDENT_AMBULATORY_CARE_PROVIDER_SITE_OTHER): Payer: BLUE CROSS/BLUE SHIELD | Admitting: Family Medicine

## 2018-01-23 ENCOUNTER — Other Ambulatory Visit: Payer: Self-pay

## 2018-01-23 VITALS — BP 126/72 | HR 72 | Temp 98.1°F | Resp 16 | Ht 74.61 in | Wt 214.6 lb

## 2018-01-23 DIAGNOSIS — Z1212 Encounter for screening for malignant neoplasm of rectum: Secondary | ICD-10-CM

## 2018-01-23 DIAGNOSIS — Z23 Encounter for immunization: Secondary | ICD-10-CM

## 2018-01-23 DIAGNOSIS — Z1389 Encounter for screening for other disorder: Secondary | ICD-10-CM

## 2018-01-23 DIAGNOSIS — E7800 Pure hypercholesterolemia, unspecified: Secondary | ICD-10-CM

## 2018-01-23 DIAGNOSIS — E785 Hyperlipidemia, unspecified: Secondary | ICD-10-CM

## 2018-01-23 DIAGNOSIS — Z125 Encounter for screening for malignant neoplasm of prostate: Secondary | ICD-10-CM

## 2018-01-23 DIAGNOSIS — Z136 Encounter for screening for cardiovascular disorders: Secondary | ICD-10-CM | POA: Diagnosis not present

## 2018-01-23 DIAGNOSIS — Z114 Encounter for screening for human immunodeficiency virus [HIV]: Secondary | ICD-10-CM

## 2018-01-23 DIAGNOSIS — Z1329 Encounter for screening for other suspected endocrine disorder: Secondary | ICD-10-CM

## 2018-01-23 DIAGNOSIS — B356 Tinea cruris: Secondary | ICD-10-CM

## 2018-01-23 DIAGNOSIS — E1165 Type 2 diabetes mellitus with hyperglycemia: Secondary | ICD-10-CM

## 2018-01-23 DIAGNOSIS — Z1211 Encounter for screening for malignant neoplasm of colon: Secondary | ICD-10-CM

## 2018-01-23 DIAGNOSIS — Z9114 Patient's other noncompliance with medication regimen: Secondary | ICD-10-CM

## 2018-01-23 DIAGNOSIS — Z794 Long term (current) use of insulin: Secondary | ICD-10-CM

## 2018-01-23 DIAGNOSIS — I10 Essential (primary) hypertension: Secondary | ICD-10-CM

## 2018-01-23 DIAGNOSIS — Z0000 Encounter for general adult medical examination without abnormal findings: Secondary | ICD-10-CM | POA: Diagnosis not present

## 2018-01-23 DIAGNOSIS — E1122 Type 2 diabetes mellitus with diabetic chronic kidney disease: Secondary | ICD-10-CM

## 2018-01-23 DIAGNOSIS — D649 Anemia, unspecified: Secondary | ICD-10-CM

## 2018-01-23 DIAGNOSIS — Z1383 Encounter for screening for respiratory disorder NEC: Secondary | ICD-10-CM

## 2018-01-23 DIAGNOSIS — N182 Chronic kidney disease, stage 2 (mild): Secondary | ICD-10-CM

## 2018-01-23 DIAGNOSIS — Z130 Encounter for screening for diseases of the blood and blood-forming organs and certain disorders involving the immune mechanism: Secondary | ICD-10-CM

## 2018-01-23 DIAGNOSIS — Z113 Encounter for screening for infections with a predominantly sexual mode of transmission: Secondary | ICD-10-CM

## 2018-01-23 LAB — POCT GLYCOSYLATED HEMOGLOBIN (HGB A1C): Hemoglobin A1C: 6.4 % — AB (ref 4.0–5.6)

## 2018-01-23 LAB — POCT URINALYSIS DIP (MANUAL ENTRY)
Bilirubin, UA: NEGATIVE
Blood, UA: NEGATIVE
Glucose, UA: >=1000 mg/dL — AB
Ketones, POC UA: NEGATIVE mg/dL
Leukocytes, UA: NEGATIVE
Nitrite, UA: NEGATIVE
Protein Ur, POC: NEGATIVE mg/dL
Spec Grav, UA: >=1.03 — AB (ref 1.010–1.025)
Urobilinogen, UA: 1 E.U./dL
pH, UA: 5 (ref 5.0–8.0)

## 2018-01-23 LAB — GLUCOSE, POCT (MANUAL RESULT ENTRY): POC Glucose: 272 mg/dl — AB (ref 70–99)

## 2018-01-23 MED ORDER — NYSTATIN 100000 UNIT/GM EX POWD: Freq: Four times a day (QID) | CUTANEOUS | 1 refills | Status: DC

## 2018-01-23 MED ORDER — INSULIN GLARGINE 100 UNIT/ML SOLOSTAR PEN: 30.0000 [IU] | PEN_INJECTOR | Freq: Every day | SUBCUTANEOUS | 3 refills | Status: DC

## 2018-01-23 MED ORDER — CLOTRIMAZOLE-BETAMETHASONE 1-0.05 % EX CREA: 1.0000 "application " | TOPICAL_CREAM | Freq: Two times a day (BID) | CUTANEOUS | 1 refills | Status: DC

## 2018-01-23 MED ORDER — FREESTYLE LIBRE 14 DAY READER DEVI: 1.0000 [IU] | 0 refills | Status: DC | PRN

## 2018-01-23 MED ORDER — INSULIN PEN NEEDLE 31G X 5 MM MISC: 1.0000 | Freq: Every day | 11 refills | Status: DC

## 2018-01-23 MED ORDER — FREESTYLE LIBRE 14 DAY SENSOR MISC: 1.0000 [IU] | 3 refills | Status: DC

## 2018-01-23 MED ORDER — METFORMIN HCL 500 MG PO TABS: 500.0000 mg | ORAL_TABLET | Freq: Two times a day (BID) | ORAL | 0 refills | Status: DC

## 2018-01-23 NOTE — Patient Instructions (Addendum)
IF you received an x-ray today, you will receive an invoice from [facility]. Please contact [facility] at [phone] with questions or concerns regarding your invoice.   IF you received labwork today, you will receive an invoice from [facility]. Please contact [facility] at [phone] with questions or concerns regarding your invoice.   Our billing staff will not be able to assist you with questions regarding bills from these companies.  You will be contacted with the lab results as soon as they are available. The fastest way to get your results is to activate your My Chart account. Instructions are located on the last page of this paperwork. If you have not heard from [place] regarding the results in 2 weeks, please contact this office.     Jock Itch Jock itch (tinea cruris) is a fungal infection of the skin in the groin area. It is sometimes called ringworm, even though it is not caused by worms. It is caused by a fungus, which is a type of germ that thrives in dark, damp places. Jock itch causes a rash and itching in the groin and upper thigh area. It usually goes away in 2-3 weeks with treatment. What are the causes? The fungus that causes jock itch may be spread by:  Touching a fungus infection elsewhere on your body-such as athlete's foot-and then touching your groin area.  Sharing towels or clothing with an infected person.  What increases the risk? Jock itch is most common in men and adolescent boys. This condition is more likely to develop from:  Being in hot, humid climates.  Wearing tight-fitting clothing or wet bathing suits for long periods of time.  Participating in sports.  Being overweight.  Having diabetes.  What are the signs or symptoms? Symptoms of jock itch may include:  A red, pink, or brown rash in the groin area. The rash may spread to the thighs, anus, and buttocks.  Dry and scaly skin on or around the rash.  Itchiness.  How is  this diagnosed? Most often, a health care provider can make the diagnosis by looking at your rash. Sometimes, a scraping of the infected skin will be taken. This sample may be tested by looking at it under a microscope or by trying to grow the fungus from the sample (culture). How is this treated? Treatment for this condition may include:  Antifungal medicine to kill the fungus. This may be in various forms: ? Skin cream or ointment. ? Medicine taken by mouth.  Skin cream or ointment to reduce the itching.  Compresses or medicated powders to dry the infected skin.  Follow these instructions at home:  Take medicines only as directed by your health care provider. Apply skin creams or ointments exactly as directed.  Wear loose-fitting clothing. ? Men should wear cotton boxer shorts. ? Women should wear cotton underwear.  Change your underwear every day to keep your groin dry.  Avoid hot baths.  Dry your groin area well after bathing. ? Use a separate towel to dry your groin area. This will help to prevent a spreading of the infection to other areas of your body.  Do not scratch the affected area.  Do not share towels with other people. Contact a health care provider if:  Your rash does not improve or it gets worse after 2 weeks of treatment.  Your rash is spreading.  Your rash returns after treatment is finished.  You have a fever.  You have redness, swelling,  or pain in the area around your rash.  You have fluid, blood, or pus coming from your rash.  Your have your rash for more than 4 weeks. This information is not intended to replace advice given to you by your health care provider. Make sure you discuss any questions you have with your health care provider. Document Released: 07/04/2002 Document Revised: 12/20/2015 Document Reviewed: 04/25/2014 Elsevier Interactive Patient Education  2018 [employer].  Diabetes Mellitus and Standards of Medical Care Managing  diabetes (diabetes mellitus) can be complicated. Your diabetes treatment may be managed by a team of health care providers, including:  A diet and nutrition specialist (registered dietitian).  A nurse.  A certified diabetes educator (CDE).  A diabetes specialist (endocrinologist).  An eye doctor.  A primary care provider.  A dentist.  Your health care providers follow a schedule in order to help you get the best quality of care. The following schedule is a general guideline for your diabetes management plan. Your health care providers may also give you more specific instructions. HbA1c ( hemoglobin A1c) test This test provides information about blood sugar (glucose) control over the previous 2-3 months. It is used to check whether your diabetes management plan needs to be adjusted.  If you are meeting your treatment goals, this test is done at least 2 times a year.  If you are not meeting treatment goals or if your treatment goals have changed, this test is done 4 times a year.  Blood pressure test  This test is done at every routine medical visit. For most people, the goal is less than 130/80. Ask your health care provider what your goal blood pressure should be. Dental and eye exams  Visit your dentist two times a year.  If you have type 1 diabetes, get an eye exam 3-5 years after you are diagnosed, and then once a year after your first exam. ? If you were diagnosed with type 1 diabetes as a child, get an eye exam when you are age 52 or older and have had diabetes for 3-5 years. After the first exam, you should get an eye exam once a year.  If you have type 2 diabetes, have an eye exam as soon as you are diagnosed, and then once a year after your first exam. Foot care exam  Visual foot exams are done at every routine medical visit. The exams check for cuts, bruises, redness, blisters, sores, or other problems with the feet.  A complete foot exam is done by your health care  provider once a year. This exam includes an inspection of the structure and skin of your feet, and a check of the pulses and sensation in your feet. ? Type 1 diabetes: Get your first exam 3-5 years after diagnosis. ? Type 2 diabetes: Get your first exam as soon as you are diagnosed.  Check your feet every day for cuts, bruises, redness, blisters, or sores. If you have any of these or other problems that are not healing, contact your health care provider. Kidney function test ( urine microalbumin)  This test is done once a year. ? Type 1 diabetes: Get your first test 5 years after diagnosis. ? Type 2 diabetes: Get your first test as soon as you are diagnosed.  If you have chronic kidney disease (CKD), get a serum creatinine and estimated glomerular filtration rate (eGFR) test once a year. Lipid profile (cholesterol, HDL, LDL, triglycerides)  This test should be done when you are  diagnosed with diabetes, and every 5 years after the first test. If you are on medicines to lower your cholesterol, you may need to get this test done every year. ? The goal for LDL is less than 100 mg/dL (5.5 mmol/L). If you are at high risk, the goal is less than 70 mg/dL (3.9 mmol/L). ? The goal for HDL is 40 mg/dL (2.2 mmol/L) for men and 50 mg/dL(2.8 mmol/L) for women. An HDL cholesterol of 60 mg/dL (3.3 mmol/L) or higher gives some protection against heart disease. ? The goal for triglycerides is less than 150 mg/dL (8.3 mmol/L). Immunizations  The yearly flu (influenza) vaccine is recommended for everyone 6 months or older who has diabetes.  The pneumonia (pneumococcal) vaccine is recommended for everyone 2 years or older who has diabetes. If you are 57 or older, you may get the pneumonia vaccine as a series of two separate shots.  The hepatitis B vaccine is recommended for adults shortly after they have been diagnosed with diabetes.  The Tdap (tetanus, diphtheria, and pertussis) vaccine should be  given: ? According to normal childhood vaccination schedules, for children. ? Every 10 years, for adults who have diabetes.  The shingles vaccine is recommended for people who have had chicken pox and are 50 years or older. Mental and emotional health  Screening for symptoms of eating disorders, anxiety, and depression is recommended at the time of diagnosis and afterward as needed. If your screening shows that you have symptoms (you have a positive screening result), you may need further evaluation and be referred to a mental health care provider. Diabetes self-management education  Education about how to manage your diabetes is recommended at diagnosis and ongoing as needed. Treatment plan  Your treatment plan will be reviewed at every medical visit. Summary  Managing diabetes (diabetes mellitus) can be complicated. Your diabetes treatment may be managed by a team of health care providers.  Your health care providers follow a schedule in order to help you get the best quality of care.  Standards of care including having regular physical exams, blood tests, blood pressure monitoring, immunizations, screening tests, and education about how to manage your diabetes.  Your health care providers may also give you more specific instructions based on your individual health. This information is not intended to replace advice given to you by your health care provider. Make sure you discuss any questions you have with your health care provider. Document Released: 05/11/2009 Document Revised: 04/11/2016 Document Reviewed: 04/11/2016 Elsevier Interactive Patient Education  2018 [employer]  Get these tests  Blood pressure- Have your blood pressure checked once a year by your healthcare provider.  Normal blood pressure is 120/80  Weight- Have your body mass index (BMI) calculated to screen for obesity.  BMI is a measure of body fat based on height and weight. You can also  calculate your own BMI at [email or web address].  Cholesterol- Have your cholesterol checked every year.  Diabetes- Have your blood sugar checked regularly if you have high blood pressure, high cholesterol, have a family history of diabetes or if you are overweight.  Screening for Colon Cancer- Colonoscopy starting at age 64.  Screening may begin sooner depending on your family history and other health conditions. Follow up colonoscopy as directed by your Gastroenterologist.  Screening for Prostate Cancer- Both blood work (PSA) and a rectal exam help screen for Prostate Cancer.  Screening begins at age 77 with African-American men and at age 59 with Caucasian  men.  Screening may begin sooner depending on your family history.  Take these medicines  Aspirin- One aspirin daily can help prevent Heart disease and Stroke.  Flu shot- Every fall.  Tetanus- Every 10 years.  Zostavax- Once after the age of 87 to prevent Shingles.  Pneumonia shot- Once after the age of 90; if you are younger than 34, ask your healthcare provider if you need a Pneumonia shot.  Take these steps  Don't smoke- If you do smoke, talk to your doctor about quitting.  For tips on how to quit, go to www.smokefree.gov or call 1-800-QUIT-NOW.  Be physically active- Exercise 5 days a week for at least 30 minutes.  If you are not already physically active start slow and gradually work up to 30 minutes of moderate physical activity.  Examples of moderate activity include walking briskly, mowing the yard, dancing, swimming, bicycling, etc.  Eat a healthy diet- Eat a variety of healthy food such as fruits, vegetables, low fat milk, low fat cheese, yogurt, lean meant, poultry, fish, beans, tofu, etc. For more information go to www.thenutritionsource.org  Drink alcohol in moderation- Limit alcohol intake to less than two drinks a day. Never drink and drive.  Dentist- Brush and floss twice daily; visit your dentist twice a  year.  Depression- Your emotional health is as important as your physical health. If you're feeling down, or losing interest in things you would normally enjoy please talk to your healthcare provider.  Eye exam- Visit your eye doctor every year.  Safe sex- If you may be exposed to a sexually transmitted infection, use a condom.  Seat belts- Seat belts can save your life; always wear one.  Smoke/Carbon Monoxide detectors- These detectors need to be installed on the appropriate level of your home.  Replace batteries at least once a year.  Skin cancer- When out in the sun, cover up and use sunscreen 15 SPF or higher.  Violence- If anyone is threatening you, please tell your healthcare provider.  Living Will/ Health care power of attorney- Speak with your healthcare provider and family.

## 2018-01-23 NOTE — Progress Notes (Signed)
Subjective:  By signing my name below, I, [name], attest that this documentation has been prepared under the direction and in the presence of Delman Cheadle, MD Electronically Signed: Ladene Artist, ED Scribe 01/23/2018 at 10:58 AM.   Patient ID: Stanley Edwards, male    DOB: [birth year], 52 y.o.   MRN: [id number]  Chief Complaint  Patient presents with  . Annual Exam   HPI Stanley Edwards is a 52 y.o. male who presents to Primary Care at [facility] for an annual exam. Pt states that he had 2 orders of oatmeal from [employer] around 7:30 AM today.  Primary Preventative Screenings: Prostate Cancer: PSA today. Denies any urinary symptoms. STI screening: HIV screening: today Colorectal Cancer: referred for colonoscopy Tobacco use/AAA/Lung/EtOH/Illicit substances: Cardiac: EKG updated today Weight/Blood sugar/Diet/Exercise: Pt has a physical job, works 12 hr shifts. States he tries to stay away from fast foods. BMI Readings from Last 3 Encounters:  01/23/18 27.10 kg/m  01/01/17 24.48 kg/m  04/03/16 23.67 kg/m   Lab Results  Component Value Date   HGBA1C 6.4 (A) 01/23/2018   OTC/Vit/Supp/Herbal: Dentist/Optho: last optho exam was ~2 yrs ago Immunizations: tetanus updated today There is no immunization history for the selected administration types on file for this patient.  Chronic Medical Conditions: DM - States he took Lipitor for about 1 month but didn't think he needed it so he stopped. He was initially started on metformin when he was diagnosed ~2.5 yrs ago but states his body started rejecting it. Currently taking Lantus 40 units qhs x 2 yrs, last dose was last night. 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 Typically checks blood glucose about 5-6 hrs after eating with average of 115 and again during 12 hr shift with reading of 126. Denies hypoglycemic lows.  Past Medical History:  Diagnosis Date  . Allergy   . Diabetes mellitus  without complication ([place])   . Hypertension    History reviewed. No pertinent surgical history. Current Outpatient Medications on File Prior to Visit  Medication Sig Dispense Refill  . atorvastatin (LIPITOR) 20 MG tablet Take 1 tablet (20 mg total) by mouth daily. (Patient not taking: Reported on 01/23/2018) 90 tablet 1  . Blood Glucose Monitoring Suppl (TRUE METRIX METER) DEVI 1 each by Does not apply route 3 (three) times daily before meals. 1 Device 0  . Blood Glucose Monitoring Suppl (TRUE METRIX METER) w/Device KIT   0  . glucose blood (TRUE METRIX BLOOD GLUCOSE TEST) test strip Use 3 times daily before meals 100 each 12  . Insulin Glargine (LANTUS SOLOSTAR) 100 UNIT/ML Solostar Pen Inject 40 Units into the skin daily at 10 pm. Needs office visit for refills. 15 mL 0  . Insulin Pen Needle 31G X 5 MM MISC 1 each by Does not apply route daily. 30 each 11  . LANTUS SOLOSTAR 100 UNIT/ML Solostar Pen INJECT 40 UNITS INTO THE SKIN DAILY AT 10 PM 15 mL 0  . lisinopril (PRINIVIL,ZESTRIL) 5 MG tablet Take 1 tablet (5 mg total) by mouth daily. (Patient not taking: Reported on 01/23/2018) 30 tablet 0  . TRUEPLUS LANCETS 28G MISC 1 each by Does not apply route 3 (three) times daily before meals. 100 each 12   No current facility-administered medications on file prior to visit.    No Known Allergies Family History  Problem Relation Age of Onset  . Cancer Mother   . Diabetes Father  Social History   Socioeconomic History  . Marital status: Single    Spouse name: Not on file  . Number of children: Not on file  . Years of education: Not on file  . Highest education level: Not on file  Occupational History  . Not on file  Social Needs  . Financial resource strain: Not on file  . Food insecurity:    Worry: Not on file    Inability: Not on file  . Transportation needs:    Medical: Not on file    Non-medical: Not on file  Tobacco Use  . Smoking status: Former Smoker    Types: Cigarettes    . Smokeless tobacco: Never Used  Substance and Sexual Activity  . Alcohol use: Yes    Alcohol/week: 0.6 oz    Types: 1 Cans of beer per week  . Drug use: No  . Sexual activity: Yes  Lifestyle  . Physical activity:    Days per week: Not on file    Minutes per session: Not on file  . Stress: Not on file  Relationships  . Social connections:    Talks on phone: Not on file    Gets together: Not on file    Attends religious service: Not on file    Active member of club or organization: Not on file    Attends meetings of clubs or organizations: Not on file    Relationship status: Not on file  Other Topics Concern  . Not on file  Social History Narrative  . Not on file   Depression screen [facility] 2/9 01/23/2018 01/01/2017 04/03/2016 10/05/2015 09/21/2015  Decreased Interest 0 0 3 0 0  Down, Depressed, Hopeless 0 0 0 0 0  PHQ - 2 Score 0 0 3 0 0  Altered sleeping - - 2 - -  Tired, decreased energy - - 1 - -  Change in appetite - - 0 - -  Feeling bad or failure about yourself  - - 3 - -  Trouble concentrating - - 0 - -  Moving slowly or fidgety/restless - - 0 - -  Suicidal thoughts - - 0 - -  PHQ-9 Score - - 9 - -   Review of Systems  Genitourinary: Negative.   All other systems reviewed and are negative.     Objective:   Physical Exam  Constitutional: He is oriented to person, place, and time. He appears well-developed and well-nourished. No distress.  HENT:  Head: Normocephalic and atraumatic.  Nose: Nose normal.  Mouth/Throat: Oropharynx is clear and moist and mucous membranes are normal.  TMs: cerumen bilaterally Mouth/Throat: postnasal drip  Eyes: Conjunctivae and EOM are normal.  Neck: Neck supple. No tracheal deviation present. No thyroid mass and no thyromegaly present.  Cardiovascular: Normal rate, regular rhythm and normal heart sounds.  Pulmonary/Chest: Effort normal and breath sounds normal. No respiratory distress.  Musculoskeletal: Normal range of motion.   Lymphadenopathy:    He has no cervical adenopathy.       Right: No supraclavicular adenopathy present.       Left: No supraclavicular adenopathy present.  Neurological: He is alert and oriented to person, place, and time.  Skin: Skin is warm and dry.  Psychiatric: He has a normal mood and affect. His behavior is normal.  Nursing note and vitals reviewed.  BP 126/72   Pulse 72   Temp 98.1 F (36.7 C)   Resp 16   Ht 6' 2.61" (1.895 m)   Wt  214 lb 9.6 oz (97.3 kg)   SpO2 99%   BMI 27.10 kg/m     Results for orders placed or performed in visit on 01/23/18  POCT glycosylated hemoglobin (Hb A1C)  Result Value Ref Range   Hemoglobin A1C 6.4 (A) 4.0 - 5.6 %   HbA1c POC (<> result, manual entry)  4.0 - 5.6 %   HbA1c, POC (prediabetic range)  5.7 - 6.4 %   HbA1c, POC (controlled diabetic range)  0.0 - 7.0 %  POCT glucose (manual entry)  Result Value Ref Range   POC Glucose 272 (A) 70 - 99 mg/dl  POCT urinalysis dipstick  Result Value Ref Range   Color, UA yellow yellow   Clarity, UA clear clear   Glucose, UA >=1,000 (A) negative mg/dL   Bilirubin, UA negative negative   Ketones, POC UA negative negative mg/dL   Spec Grav, UA >=1.030 (A) 1.010 - 1.025   Blood, UA negative negative   pH, UA 5.0 5.0 - 8.0   Protein Ur, POC negative negative mg/dL   Urobilinogen, UA 1.0 0.2 or 1.0 E.U./dL   Nitrite, UA Negative Negative   Leukocytes, UA Negative Negative   Assessment & Plan:   1. Annual physical exam   2. Screening for cardiovascular, respiratory, and genitourinary diseases   3. Screening for colorectal cancer   4. Screening for prostate cancer   5. Routine screening for STI (sexually transmitted infection)   6. Screening for deficiency anemia   7. Screening for thyroid disorder   8. Essential hypertension   9. Type 2 diabetes mellitus with stage 2 chronic kidney disease, with long-term current use of insulin ([place])   10. Anemia, unspecified type   11. Pure  hypercholesterolemia   12. Noncompliance with medications   13. Screening for HIV (human immunodeficiency virus)   14. Hyperlipidemia, unspecified hyperlipidemia type   15. Type 2 diabetes mellitus with hyperglycemia, with long-term current use of insulin ([facility])   16. Tinea cruris     Orders Placed This Encounter  Procedures  . Pneumococcal polysaccharide vaccine 23-valent greater than or equal to 2yo subcutaneous/IM  . Tdap vaccine greater than or equal to 7yo IM  . Comprehensive metabolic panel    Order Specific Question:   Has the patient fasted?    Answer:   Yes  . TSH  . CBC with Differential/Platelet  . Microalbumin/Creatinine Ratio, Urine  . HIV antibody  . PSA  . Lipid panel    Standing Status:   Future    Standing Expiration Date:   01/24/2019    Order Specific Question:   Has the patient fasted?    Answer:   Yes  . C-peptide  . Lipid panel  . Ambulatory referral to Gastroenterology    Referral Priority:   Routine    Referral Type:   Consultation    Referral Reason:   Specialty Services Required    Number of Visits Requested:   1  . Ambulatory referral to Ophthalmology    Referral Priority:   Routine    Referral Type:   Consultation    Referral Reason:   Specialty Services Required    Referred to Provider:   Warden Fillers, MD    Requested Specialty:   Ophthalmology    Number of Visits Requested:   1  . POCT glycosylated hemoglobin (Hb A1C)  . POCT glucose (manual entry)  . POCT urinalysis dipstick  . EKG 12-Lead  . HM DIABETES FOOT EXAM  Meds ordered this encounter  Medications  . DISCONTD: metFORMIN (GLUCOPHAGE) 500 MG tablet    Sig: Take 1 tablet (500 mg total) by mouth 2 (two) times daily with a meal. X 2 weeks then 2 tabs twice a day    Dispense:  180 tablet    Refill:  0  . DISCONTD: Insulin Glargine (LANTUS SOLOSTAR) 100 UNIT/ML Solostar Pen    Sig: Inject 30 Units into the skin daily at 10 pm.    Dispense:  30 mL    Refill:  3  . Insulin  Pen Needle 31G X 5 MM MISC    Sig: 1 each by Does not apply route daily.    Dispense:  90 each    Refill:  11  . DISCONTD: clotrimazole-betamethasone (LOTRISONE) cream    Sig: Apply 1 application topically 2 (two) times daily. X 2 weeks, then switch to clotrimazole twice daily until completely gone.    Dispense:  45 g    Refill:  1  . nystatin (MYCOSTATIN/NYSTOP) powder    Sig: Apply topically 4 (four) times daily.    Dispense:  60 g    Refill:  1  . Continuous Blood Gluc Receiver (FREESTYLE LIBRE 14 DAY READER) DEVI    Sig: 1 Units by Does not apply route as needed.    Dispense:  1 Device    Refill:  0  . Continuous Blood Gluc Sensor (FREESTYLE LIBRE 14 DAY SENSOR) MISC    Sig: Apply 1 Units topically every 14 (fourteen) days.    Dispense:  6 each    Refill:  3   I personally performed the services described in this documentation, which was scribed in my presence. The recorded information has been reviewed and considered, and addended by me as needed.   Delman Cheadle, M.D.  Primary Care at [facility] [address] [place],  [address] [phone] phone [phone] fax  05/09/18 5:20 PM

## 2018-01-24 LAB — CBC WITH DIFFERENTIAL/PLATELET
Basophils Absolute: 0 10*3/uL (ref 0.0–0.2)
Basos: 1 %
EOS (ABSOLUTE): 0.2 10*3/uL (ref 0.0–0.4)
Eos: 5 %
Hematocrit: 36.7 % — ABNORMAL LOW (ref 37.5–51.0)
Hemoglobin: 12.4 g/dL — ABNORMAL LOW (ref 13.0–17.7)
Immature Grans (Abs): 0 10*3/uL (ref 0.0–0.1)
Immature Granulocytes: 0 %
Lymphocytes Absolute: 1.4 10*3/uL (ref 0.7–3.1)
Lymphs: 32 %
MCH: 33.8 pg — ABNORMAL HIGH (ref 26.6–33.0)
MCHC: 33.8 g/dL (ref 31.5–35.7)
MCV: 100 fL — ABNORMAL HIGH (ref 79–97)
Monocytes Absolute: 0.4 10*3/uL (ref 0.1–0.9)
Monocytes: 8 %
Neutrophils Absolute: 2.4 10*3/uL (ref 1.4–7.0)
Neutrophils: 54 %
Platelets: 291 10*3/uL (ref 150–450)
RBC: 3.67 x10E6/uL — ABNORMAL LOW (ref 4.14–5.80)
RDW: 12.3 % (ref 12.3–15.4)
WBC: 4.4 10*3/uL (ref 3.4–10.8)

## 2018-01-24 LAB — COMPREHENSIVE METABOLIC PANEL
ALT: 21 IU/L (ref 0–44)
AST: 16 IU/L (ref 0–40)
Albumin/Globulin Ratio: 1.4 (ref 1.2–2.2)
Albumin: 4.1 g/dL (ref 3.5–5.5)
Alkaline Phosphatase: 108 IU/L (ref 39–117)
BUN/Creatinine Ratio: 11 (ref 9–20)
BUN: 14 mg/dL (ref 6–24)
Bilirubin Total: 0.4 mg/dL (ref 0.0–1.2)
CO2: 23 mmol/L (ref 20–29)
Calcium: 9.2 mg/dL (ref 8.7–10.2)
Chloride: 99 mmol/L (ref 96–106)
Creatinine, Ser: 1.26 mg/dL (ref 0.76–1.27)
GFR calc Af Amer: 75 mL/min/{1.73_m2} (ref >59)
GFR calc non Af Amer: 65 mL/min/{1.73_m2} (ref >59)
Globulin, Total: 3 g/dL (ref 1.5–4.5)
Glucose: 270 mg/dL — ABNORMAL HIGH (ref 65–99)
Potassium: 4.1 mmol/L (ref 3.5–5.2)
Sodium: 135 mmol/L (ref 134–144)
Total Protein: 7.1 g/dL (ref 6.0–8.5)

## 2018-01-24 LAB — LIPID PANEL
Chol/HDL Ratio: 4.2 ratio (ref 0.0–5.0)
Cholesterol, Total: 180 mg/dL (ref 100–199)
HDL: 43 mg/dL (ref >39)
LDL Calculated: 118 mg/dL — ABNORMAL HIGH (ref 0–99)
Triglycerides: 94 mg/dL (ref 0–149)
VLDL Cholesterol Cal: 19 mg/dL (ref 5–40)

## 2018-01-24 LAB — MICROALBUMIN / CREATININE URINE RATIO
Creatinine, Urine: 250.3 mg/dL
Microalb/Creat Ratio: 2.4 mg/g creat (ref 0.0–30.0)
Microalbumin, Urine: 6 ug/mL

## 2018-01-24 LAB — PSA: Prostate Specific Ag, Serum: 0.8 ng/mL (ref 0.0–4.0)

## 2018-01-24 LAB — TSH: TSH: 1.79 u[IU]/mL (ref 0.450–4.500)

## 2018-01-24 LAB — HIV ANTIBODY (ROUTINE TESTING W REFLEX): HIV Screen 4th Generation wRfx: NONREACTIVE

## 2018-01-24 LAB — C-PEPTIDE: C-Peptide: 2.1 ng/mL (ref 1.1–4.4)

## 2018-02-23 ENCOUNTER — Telehealth: Payer: Self-pay | Admitting: Family Medicine

## 2018-02-23 DIAGNOSIS — Z794 Long term (current) use of insulin: Principal | ICD-10-CM

## 2018-02-23 DIAGNOSIS — E1165 Type 2 diabetes mellitus with hyperglycemia: Secondary | ICD-10-CM

## 2018-02-23 DIAGNOSIS — E1122 Type 2 diabetes mellitus with diabetic chronic kidney disease: Secondary | ICD-10-CM

## 2018-02-23 DIAGNOSIS — N182 Chronic kidney disease, stage 2 (mild): Principal | ICD-10-CM

## 2018-02-23 DIAGNOSIS — Z9114 Patient's other noncompliance with medication regimen: Secondary | ICD-10-CM

## 2018-02-23 DIAGNOSIS — E785 Hyperlipidemia, unspecified: Secondary | ICD-10-CM

## 2018-02-23 MED ORDER — CLOTRIMAZOLE-BETAMETHASONE 1-0.05 % EX CREA: 1.0000 "application " | TOPICAL_CREAM | Freq: Two times a day (BID) | CUTANEOUS | 1 refills | Status: DC

## 2018-02-23 MED ORDER — METFORMIN HCL 500 MG PO TABS: 500.0000 mg | ORAL_TABLET | Freq: Two times a day (BID) | ORAL | 0 refills | Status: DC

## 2018-02-23 MED ORDER — INSULIN GLARGINE 100 UNIT/ML SOLOSTAR PEN
30.0000 [IU] | PEN_INJECTOR | Freq: Every day | SUBCUTANEOUS | 3 refills | Status: DC
Start: 2018-02-23 — End: 2018-09-27

## 2018-02-23 NOTE — Telephone Encounter (Signed)
Medications resent to pharmacy as requested. 

## 2018-02-23 NOTE — Telephone Encounter (Signed)
Copied from CRM [id number]. Topic: Quick Communication - Rx Refill/Question >> Feb 23, 2018  9:48 AM Marylen PontoMcneil, Ja-Kwan wrote: Pt states he was not able to get his medications previously but he would like to get them now. Pt states he contacted the pharmacy but he was told the Rx had to be re-submitted.  Medication: Insulin Glargine (LANTUS SOLOSTAR) 100 UNIT/ML Solostar Pen, metFORMIN (GLUCOPHAGE) 500 MG tablet, and clotrimazole-betamethasone (LOTRISONE) cream  Has the patient contacted their pharmacy? yes   Preferred Pharmacy (with phone number or street name): [id number] [place]- [place], [place] - [address] [facility] [address] [phone] (Phone) [phone] (Fax)   Agent: Please be advised that RX refills may take up to 3 business days. We ask that you follow-up with your pharmacy.

## 2018-04-14 ENCOUNTER — Other Ambulatory Visit: Payer: Self-pay | Admitting: Family Medicine

## 2018-04-30 ENCOUNTER — Encounter: Payer: Self-pay | Admitting: Family Medicine

## 2018-09-27 ENCOUNTER — Other Ambulatory Visit: Payer: Self-pay | Admitting: Family Medicine

## 2018-09-27 DIAGNOSIS — Z794 Long term (current) use of insulin: Principal | ICD-10-CM

## 2018-09-27 DIAGNOSIS — E1122 Type 2 diabetes mellitus with diabetic chronic kidney disease: Secondary | ICD-10-CM

## 2018-09-27 DIAGNOSIS — E785 Hyperlipidemia, unspecified: Secondary | ICD-10-CM

## 2018-09-27 DIAGNOSIS — N182 Chronic kidney disease, stage 2 (mild): Principal | ICD-10-CM

## 2018-09-27 DIAGNOSIS — Z9114 Patient's other noncompliance with medication regimen: Secondary | ICD-10-CM

## 2018-09-27 DIAGNOSIS — E1165 Type 2 diabetes mellitus with hyperglycemia: Secondary | ICD-10-CM

## 2018-09-27 NOTE — Telephone Encounter (Signed)
Copied from CRM [id number]. Topic: Quick Communication - See Telephone Encounter >> Sep 27, 2018 10:32 AM Trula Slade wrote: CRM for notification. See Telephone encounter for: 09/27/18. Patient would like a refill on his Insulin Glargine (LANTUS SOLOSTAR) 100 UNIT/ML Solostar Pen medication, but he would like pens.  They come 5 in a box.  He would like that sent to his preferred pharmacy [facility] on [address] in [place].

## 2018-09-29 NOTE — Telephone Encounter (Signed)
Please advise on refills.  

## 2018-10-05 MED ORDER — INSULIN GLARGINE 100 UNIT/ML SOLOSTAR PEN: 30.0000 [IU] | PEN_INJECTOR | Freq: Every day | SUBCUTANEOUS | 0 refills | Status: DC

## 2018-10-05 NOTE — Telephone Encounter (Signed)
Pt needs OV for further refills. Did not have visit sched - now on the books to see Dr. Leretha Pol on 7/1. I saw pt on 12/2017 and asked him to return to clinic for repeat eval in 1 mo - so he is 8 mos overdue for f/u. Sent in 1 box on the lantus which should last him about 1.5 mos - rec resched appt w/ Dr. Leretha Pol for sooner

## 2018-10-05 NOTE — Telephone Encounter (Signed)
Pt called in to follow up on his refill request for  Insulin Glargine (LANTUS SOLOSTAR) 100 UNIT/ML Solostar Pen. Pt is due to see Dr. Leretha Pol (switching from [place]), pt would like to know if [place] would take care of refill instead?   Please advise.    CB: [phone]

## 2018-11-26 ENCOUNTER — Other Ambulatory Visit: Payer: Self-pay

## 2018-11-26 ENCOUNTER — Emergency Department (HOSPITAL_COMMUNITY)
Admission: EM | Admit: 2018-11-26 | Discharge: 2018-11-27 | Disposition: A | Discharge status: Home / Self Care | Payer: BLUE CROSS/BLUE SHIELD | Attending: Emergency Medicine | Admitting: Emergency Medicine

## 2018-11-26 ENCOUNTER — Encounter (HOSPITAL_COMMUNITY): Payer: Self-pay | Admitting: Emergency Medicine

## 2018-11-26 DIAGNOSIS — Z79899 Other long term (current) drug therapy: Secondary | ICD-10-CM | POA: Diagnosis not present

## 2018-11-26 DIAGNOSIS — E119 Type 2 diabetes mellitus without complications: Secondary | ICD-10-CM | POA: Insufficient documentation

## 2018-11-26 DIAGNOSIS — R197 Diarrhea, unspecified: Secondary | ICD-10-CM

## 2018-11-26 DIAGNOSIS — R109 Unspecified abdominal pain: Secondary | ICD-10-CM | POA: Diagnosis not present

## 2018-11-26 DIAGNOSIS — Z794 Long term (current) use of insulin: Secondary | ICD-10-CM | POA: Insufficient documentation

## 2018-11-26 DIAGNOSIS — Z87891 Personal history of nicotine dependence: Secondary | ICD-10-CM | POA: Diagnosis not present

## 2018-11-26 NOTE — ED Triage Notes (Signed)
C/o diarrhea last night and today.  States it stopped 4 hours ago and his employer will not let him return without a work note.  Sates he feels fine now and just needs a note to return to work.

## 2018-11-27 NOTE — Discharge Instructions (Signed)
You may benefit from the use of probiotics to help relieve your diarrhea.  Use Tylenol or ibuprofen for any cramping.  Follow-up with your primary care doctor as needed to ensure resolution of symptoms.

## 2018-11-27 NOTE — ED Provider Notes (Signed)
[facility] EMERGENCY DEPARTMENT Provider Note   CSN: [id number] Arrival date & time: 11/26/18  2326    History   Chief Complaint Chief Complaint  Patient presents with  . Letter for School/Work    HPI Stanley Edwards is a 53 y.o. male.     53 year old male with a history of diabetes and hypertension presents to the ED requesting a work note.  Reports onset of diarrhea last night which has been self resolving.  Had some abdominal cramping initially.  This has subsided.  Believes that his symptoms were due to eating undercooked chicken.  He has not had any fevers, cough, nasal congestion, nausea, vomiting, melena, hematochezia.  No history of abdominal surgeries.  He is requesting to return to work tomorrow.  The history is provided by the patient. No language interpreter was used.    Past Medical History:  Diagnosis Date  . Allergy   . Diabetes mellitus without complication ([place])   . Hypertension    Pt denies htn but it was on his previous hx    Patient Active Problem List   Diagnosis Date Noted  . Hypertension 01/01/2017  . Non-compliance 04/03/2016  . AKI (acute kidney injury) ([place]) 03/24/2016  . Hyperlipidemia 09/24/2015  . Noncompliance with medications 09/21/2015  . DM2 (diabetes mellitus, type 2) ([place]) 09/19/2015  . Hypokalemia 09/19/2015  . Anemia 09/19/2015  . DKA (diabetic ketoacidoses) ([place]) 09/16/2015    History reviewed. No pertinent surgical history.      Home Medications    Prior to Admission medications   Medication Sig Start Date End Date Taking? Authorizing Provider  Blood Glucose Monitoring Suppl (TRUE METRIX METER) DEVI 1 each by Does not apply route 3 (three) times daily before meals. 09/21/15   Charlott Rakes, MD  Blood Glucose Monitoring Suppl (TRUE METRIX METER) w/Device KIT  09/21/15   [provider]  clotrimazole-betamethasone (LOTRISONE) cream Apply 1 application topically 2 (two) times daily. X 2 weeks, then  switch to clotrimazole twice daily until completely gone. 02/23/18   Shawnee Knapp, MD  Continuous Blood Gluc Receiver (FREESTYLE LIBRE 14 DAY READER) DEVI 1 Units by Does not apply route as needed. 01/23/18   Shawnee Knapp, MD  Continuous Blood Gluc Sensor (FREESTYLE LIBRE 14 DAY SENSOR) MISC Apply 1 Units topically every 14 (fourteen) days. 01/23/18   Shawnee Knapp, MD  glucose blood (TRUE METRIX BLOOD GLUCOSE TEST) test strip Use 3 times daily before meals 01/01/17   Shawnee Knapp, MD  Insulin Glargine (LANTUS SOLOSTAR) 100 UNIT/ML Solostar Pen Inject 30 Units into the skin daily at 10 pm. 10/05/18   Shawnee Knapp, MD  Insulin Pen Needle 31G X 5 MM MISC 1 each by Does not apply route daily. 01/23/18   Shawnee Knapp, MD  metFORMIN (GLUCOPHAGE) 500 MG tablet Take 2 tablets (1,000 mg total) by mouth 2 (two) times daily with a meal. Needs office visit for more refills 04/14/18   Shawnee Knapp, MD  nystatin (MYCOSTATIN/NYSTOP) powder Apply topically 4 (four) times daily. 01/23/18   Shawnee Knapp, MD  TRUEPLUS LANCETS 28G MISC 1 each by Does not apply route 3 (three) times daily before meals. 01/01/17   Shawnee Knapp, MD    Family History Family History  Problem Relation Age of Onset  . Cancer Mother   . Diabetes Father     Social History Social History   Tobacco Use  . Smoking status: Former Smoker  Types: Cigarettes  . Smokeless tobacco: Never Used  Substance Use Topics  . Alcohol use: Yes    Alcohol/week: 1.0 standard drinks    Types: 1 Cans of beer per week  . Drug use: No     Allergies   Patient has no known allergies.   Review of Systems Review of Systems Ten systems reviewed and are negative for acute change, except as noted in the HPI.    Physical Exam Updated Vital Signs BP 122/81 (BP Location: Right Arm)   Pulse 83   Temp 98.6 F (37 C) (Oral)   Resp 18   SpO2 99%   Physical Exam Vitals signs and nursing note reviewed.  Constitutional:      General: He is not in acute distress.     Appearance: He is well-developed. He is not diaphoretic.     Comments: Nontoxic appearing and in NAD  HENT:     Head: Normocephalic and atraumatic.  Eyes:     General: No scleral icterus.    Conjunctiva/sclera: Conjunctivae normal.  Neck:     Musculoskeletal: Normal range of motion.  Pulmonary:     Effort: Pulmonary effort is normal. No respiratory distress.     Comments: Respirations even and unlabored Abdominal:     General: There is no distension.     Palpations: There is no mass.     Tenderness: There is no abdominal tenderness. There is no guarding.     Comments: Soft, nontender abdomen.  Musculoskeletal: Normal range of motion.  Skin:    General: Skin is warm and dry.     Coloration: Skin is not pale.     Findings: No erythema or rash.  Neurological:     Mental Status: He is alert and oriented to person, place, and time.     Coordination: Coordination normal.  Psychiatric:        Behavior: Behavior normal.      ED Treatments / Results  Labs (all labs ordered are listed, but only abnormal results are displayed) Labs Reviewed - No data to display  EKG None  Radiology No results found.  Procedures Procedures (including critical care time)  Medications Ordered in ED Medications - No data to display   Initial Impression / Assessment and Plan / ED Course  I have reviewed the triage vital signs and the nursing notes.  Pertinent labs & imaging results that were available during my care of the patient were reviewed by me and considered in my medical decision making (see chart for details).        53 year old male presents to the ED requesting work note.  His vitals are stable.  Abdomen soft, nontender.  Had 36 hours of diarrhea which has been self resolving.  I do not see indication for further emergent work-up.  Seems reasonable for him to return to work tomorrow, 11/27/2018.  Return precautions discussed and provided. Patient discharged in stable condition with  no unaddressed concerns.   Final Clinical Impressions(s) / ED Diagnoses   Final diagnoses:  Diarrhea, unspecified type    ED Discharge Orders    None       Antonietta Breach, PA-C 11/27/18 0010    Palumbo, April, MD 11/27/18 [phone]

## 2019-01-04 NOTE — Telephone Encounter (Signed)
Already has an appt scheduled with Dr. Pamella Pert on 02/21/2019

## 2019-01-26 ENCOUNTER — Encounter: Payer: BLUE CROSS/BLUE SHIELD | Admitting: Family Medicine

## 2019-02-07 ENCOUNTER — Encounter: Payer: BLUE CROSS/BLUE SHIELD | Admitting: Family Medicine

## 2019-02-08 ENCOUNTER — Encounter: Payer: Self-pay | Admitting: Family Medicine

## 2019-03-21 ENCOUNTER — Other Ambulatory Visit: Payer: Self-pay

## 2019-03-21 ENCOUNTER — Encounter (HOSPITAL_COMMUNITY): Payer: Self-pay | Admitting: Family Medicine

## 2019-03-21 ENCOUNTER — Inpatient Hospital Stay (HOSPITAL_COMMUNITY)
Admission: EM | Admit: 2019-03-21 | Discharge: 2019-03-24 | DRG: 638 | Disposition: A | Discharge status: Home / Self Care | Payer: BLUE CROSS/BLUE SHIELD | Attending: Internal Medicine | Admitting: Internal Medicine

## 2019-03-21 DIAGNOSIS — N179 Acute kidney failure, unspecified: Secondary | ICD-10-CM | POA: Diagnosis present

## 2019-03-21 DIAGNOSIS — E876 Hypokalemia: Secondary | ICD-10-CM | POA: Diagnosis present

## 2019-03-21 DIAGNOSIS — Z833 Family history of diabetes mellitus: Secondary | ICD-10-CM

## 2019-03-21 DIAGNOSIS — Z560 Unemployment, unspecified: Secondary | ICD-10-CM

## 2019-03-21 DIAGNOSIS — Z794 Long term (current) use of insulin: Secondary | ICD-10-CM

## 2019-03-21 DIAGNOSIS — E1110 Type 2 diabetes mellitus with ketoacidosis without coma: Secondary | ICD-10-CM | POA: Diagnosis present

## 2019-03-21 DIAGNOSIS — I10 Essential (primary) hypertension: Secondary | ICD-10-CM | POA: Diagnosis present

## 2019-03-21 DIAGNOSIS — R17 Unspecified jaundice: Secondary | ICD-10-CM | POA: Diagnosis present

## 2019-03-21 DIAGNOSIS — Z20828 Contact with and (suspected) exposure to other viral communicable diseases: Secondary | ICD-10-CM | POA: Diagnosis present

## 2019-03-21 DIAGNOSIS — E870 Hyperosmolality and hypernatremia: Secondary | ICD-10-CM | POA: Diagnosis present

## 2019-03-21 DIAGNOSIS — E875 Hyperkalemia: Secondary | ICD-10-CM | POA: Diagnosis present

## 2019-03-21 DIAGNOSIS — D72829 Elevated white blood cell count, unspecified: Secondary | ICD-10-CM | POA: Diagnosis present

## 2019-03-21 LAB — CBC WITH DIFFERENTIAL/PLATELET
Abs Immature Granulocytes: 0.08 10*3/uL — ABNORMAL HIGH (ref 0.00–0.07)
Basophils Absolute: 0 10*3/uL (ref 0.0–0.1)
Basophils Relative: 0 %
Eosinophils Absolute: 0 10*3/uL (ref 0.0–0.5)
Eosinophils Relative: 0 %
HCT: 47.3 % (ref 39.0–52.0)
Hemoglobin: 14.9 g/dL (ref 13.0–17.0)
Immature Granulocytes: 1 %
Lymphocytes Relative: 4 %
Lymphs Abs: 0.6 10*3/uL — ABNORMAL LOW (ref 0.7–4.0)
MCH: 34.9 pg — ABNORMAL HIGH (ref 26.0–34.0)
MCHC: 31.5 g/dL (ref 30.0–36.0)
MCV: 110.8 fL — ABNORMAL HIGH (ref 80.0–100.0)
Monocytes Absolute: 0.6 10*3/uL (ref 0.1–1.0)
Monocytes Relative: 4 %
Neutro Abs: 14.1 10*3/uL — ABNORMAL HIGH (ref 1.7–7.7)
Neutrophils Relative %: 91 %
Platelets: 397 10*3/uL (ref 150–400)
RBC: 4.27 MIL/uL (ref 4.22–5.81)
RDW: 12.2 % (ref 11.5–15.5)
WBC: 15.4 10*3/uL — ABNORMAL HIGH (ref 4.0–10.5)
nRBC: 0 % (ref 0.0–0.2)

## 2019-03-21 LAB — BASIC METABOLIC PANEL
Anion gap: 28 — ABNORMAL HIGH (ref 5–15)
Anion gap: 27 — ABNORMAL HIGH (ref 5–15)
BUN: 54 mg/dL — ABNORMAL HIGH (ref 6–20)
BUN: 53 mg/dL — ABNORMAL HIGH (ref 6–20)
CO2: 12 mmol/L — ABNORMAL LOW (ref 22–32)
CO2: 11 mmol/L — ABNORMAL LOW (ref 22–32)
Calcium: 9.5 mg/dL (ref 8.9–10.3)
Calcium: 9.3 mg/dL (ref 8.9–10.3)
Chloride: 105 mmol/L (ref 98–111)
Chloride: 107 mmol/L (ref 98–111)
Creatinine, Ser: 3.27 mg/dL — ABNORMAL HIGH (ref 0.61–1.24)
Creatinine, Ser: 2.83 mg/dL — ABNORMAL HIGH (ref 0.61–1.24)
GFR calc Af Amer: 24 mL/min — ABNORMAL LOW (ref >60)
GFR calc Af Amer: 28 mL/min — ABNORMAL LOW (ref >60)
GFR calc non Af Amer: 20 mL/min — ABNORMAL LOW (ref >60)
GFR calc non Af Amer: 24 mL/min — ABNORMAL LOW (ref >60)
Glucose, Bld: 642 mg/dL (ref 70–99)
Glucose, Bld: 574 mg/dL (ref 70–99)
Potassium: 5.6 mmol/L — ABNORMAL HIGH (ref 3.5–5.1)
Potassium: 5.6 mmol/L — ABNORMAL HIGH (ref 3.5–5.1)
Sodium: 145 mmol/L (ref 135–145)
Sodium: 145 mmol/L (ref 135–145)

## 2019-03-21 LAB — HEPATIC FUNCTION PANEL
ALT: 16 U/L (ref 0–44)
AST: 12 U/L — ABNORMAL LOW (ref 15–41)
Albumin: 4.1 g/dL (ref 3.5–5.0)
Alkaline Phosphatase: 140 U/L — ABNORMAL HIGH (ref 38–126)
Bilirubin, Direct: 0.3 mg/dL — ABNORMAL HIGH (ref 0.0–0.2)
Indirect Bilirubin: 2.8 mg/dL — ABNORMAL HIGH (ref 0.3–0.9)
Total Bilirubin: 3.1 mg/dL — ABNORMAL HIGH (ref 0.3–1.2)
Total Protein: 8.3 g/dL — ABNORMAL HIGH (ref 6.5–8.1)

## 2019-03-21 LAB — LIPASE, BLOOD: Lipase: 38 U/L (ref 11–51)

## 2019-03-21 LAB — CBG MONITORING, ED
Glucose-Capillary: 568 mg/dL (ref 70–99)
Glucose-Capillary: 447 mg/dL — ABNORMAL HIGH (ref 70–99)
Glucose-Capillary: 458 mg/dL — ABNORMAL HIGH (ref 70–99)

## 2019-03-21 MED ORDER — SODIUM CHLORIDE 0.9 % IV SOLN
INTRAVENOUS | Status: DC
  Administered 2019-03-22: 125 mL/h via INTRAVENOUS

## 2019-03-21 MED ORDER — SODIUM CHLORIDE 0.9 % IV BOLUS
1000.0000 mL | Freq: Once | INTRAVENOUS | Status: AC
  Administered 2019-03-21: 1000 mL via INTRAVENOUS

## 2019-03-21 MED ORDER — SODIUM CHLORIDE 0.9 % IV SOLN
1.0000 g | Freq: Once | INTRAVENOUS | Status: AC
  Administered 2019-03-21: 1 g via INTRAVENOUS
  Filled 2019-03-21 (×2): qty 10

## 2019-03-21 MED ORDER — INSULIN REGULAR(HUMAN) IN NACL 100-0.9 UT/100ML-% IV SOLN
INTRAVENOUS | Status: DC
  Administered 2019-03-21: 3.9 [IU]/h via INTRAVENOUS
  Filled 2019-03-21: qty 100

## 2019-03-21 MED ORDER — SODIUM CHLORIDE 0.9 % IV SOLN: Freq: Once | INTRAVENOUS | Status: DC

## 2019-03-21 MED ORDER — DEXTROSE-NACL 5-0.45 % IV SOLN: INTRAVENOUS | Status: DC

## 2019-03-21 NOTE — ED Triage Notes (Signed)
Pt recently lost job and has been out of insulin x 5 days. Pt has polyuria, n/v since yesterday, generalized weakness. Denies pain.

## 2019-03-21 NOTE — H&P (Signed)
History and Physical    Stanley Edwards PXT:[id number] DOB: [birth year] DOA: 03/21/2019  PCP: Shawnee Knapp, MD   Patient coming from: Home   Chief Complaint: Polyuria, N/V, out of insulin   HPI: Stanley Edwards is a 53 y.o. male with medical history significant for insulin-dependent diabetes mellitus, now presenting to emergency department for evaluation of polyuria, nausea, and vomiting after running out of his insulin 2 months ago.  Patient reports that he lost his job, ran out of insulin roughly 9 weeks ago, and was unable to get more.  Reports that he continued to do okay until developing polyuria, polydipsia, nausea, and generalized weakness.  Symptoms have worsened significantly over the past couple days and he has had a few bouts of nonbloody vomiting today without abdominal pain or diarrhea.  He denies any fevers, chills, chest pain, cough, or shortness of breath.  ED Course: Upon arrival to the ED, patient is found to be afebrile, saturating well on room air, tachycardic in the 120s, and with stable blood pressure.  EKG features sinus tachycardia with rate 123.  Chemistry panel is notable for glucose of 642, bicarbonate 12, anion gap 28, potassium 5.6, and creatinine 3.27, up from priors of less than 1.  CBC is notable for leukocytosis to 15,400 and a macrocytosis without anemia.  Patient was given 2 L normal saline, IV calcium, and started on insulin infusion in the ED.  Hospitalists are consulted for admission.  Review of Systems:  All other systems reviewed and apart from HPI, are negative.  Past Medical History:  Diagnosis Date  . Allergy   . Diabetes mellitus without complication ([place])   . Hypertension    Pt denies htn but it was on his previous hx    History reviewed. No pertinent surgical history.   reports that he has quit smoking. His smoking use included cigarettes. He has never used smokeless tobacco. He reports current alcohol use of about 1.0 standard drinks of alcohol  per week. He reports that he does not use drugs.  No Known Allergies  Family History  Problem Relation Age of Onset  . Cancer Mother   . Diabetes Father      Prior to Admission medications   Medication Sig Start Date End Date Taking? Authorizing Provider  Insulin Glargine (LANTUS SOLOSTAR) 100 UNIT/ML Solostar Pen Inject 30 Units into the skin daily at 10 pm. 10/05/18  Yes Shawnee Knapp, MD  Blood Glucose Monitoring Suppl (TRUE METRIX METER) DEVI 1 each by Does not apply route 3 (three) times daily before meals. 09/21/15   Charlott Rakes, MD  Blood Glucose Monitoring Suppl (TRUE METRIX METER) w/Device KIT  09/21/15   [provider]  clotrimazole-betamethasone (LOTRISONE) cream Apply 1 application topically 2 (two) times daily. X 2 weeks, then switch to clotrimazole twice daily until completely gone. Patient not taking: Reported on 03/21/2019 02/23/18   Shawnee Knapp, MD  Continuous Blood Gluc Receiver (FREESTYLE LIBRE 14 DAY READER) DEVI 1 Units by Does not apply route as needed. 01/23/18   Shawnee Knapp, MD  Continuous Blood Gluc Sensor (FREESTYLE LIBRE 14 DAY SENSOR) MISC Apply 1 Units topically every 14 (fourteen) days. 01/23/18   Shawnee Knapp, MD  glucose blood (TRUE METRIX BLOOD GLUCOSE TEST) test strip Use 3 times daily before meals 01/01/17   Shawnee Knapp, MD  Insulin Pen Needle 31G X 5 MM MISC 1 each by Does not apply route daily. 01/23/18   Delman Cheadle  N, MD  metFORMIN (GLUCOPHAGE) 500 MG tablet Take 2 tablets (1,000 mg total) by mouth 2 (two) times daily with a meal. Needs office visit for more refills Patient not taking: Reported on 03/21/2019 04/14/18   Shawnee Knapp, MD  nystatin (MYCOSTATIN/NYSTOP) powder Apply topically 4 (four) times daily. Patient not taking: Reported on 03/21/2019 01/23/18   Shawnee Knapp, MD  TRUEPLUS LANCETS 28G MISC 1 each by Does not apply route 3 (three) times daily before meals. 01/01/17   Shawnee Knapp, MD    Physical Exam: Vitals:   03/21/19 1652 03/21/19 1658  03/21/19 2045 03/21/19 2100  BP:  122/76 114/69 116/64  Pulse:  (!) 128  (!) 117  Resp:  14  16  Temp:  98.2 F (36.8 C)    TempSrc:  Oral    SpO2:  98%  97%  Height: '6\' 2"'$  (1.88 m)       Constitutional: NAD, lethargic, no diaphoresis, no pallor   Eyes: PERTLA, lids and conjunctivae normal ENMT: Mucous membranes are moist. Posterior pharynx clear of any exudate or lesions.   Neck: normal, supple, no masses, no thyromegaly Respiratory: no wheezing, no crackles. Normal respiratory effort. No accessory muscle use.  Cardiovascular: Rate ~120 and regular. No extremity edema.   Abdomen: No distension, no tenderness, soft. Bowel sounds normal.  Musculoskeletal: no clubbing / cyanosis. No joint deformity upper and lower extremities.    Skin: no significant rashes, lesions, ulcers. Warm, dry, well-perfused. Neurologic: CN 2-12 grossly intact. Sensation intact. Strength 5/5 in all 4 limbs.  Psychiatric: lethargic, oriented x 3. Calm, cooperative.    Labs on Admission: I have personally reviewed following labs and imaging studies  CBC: Recent Labs  Lab 03/21/19 1708 03/21/19 1807  WBC 15.4*  --   NEUTROABS 14.1*  --   HGB 14.9 15.3  HCT 47.3 45.0  MCV 110.8*  --   PLT 397  --    Basic Metabolic Panel: Recent Labs  Lab 03/21/19 1708 03/21/19 1807 03/21/19 1950  NA 145 141 145  K 5.6* >8.5* 5.6*  CL 105  --  107  CO2 12*  --  11*  GLUCOSE 642*  --  574*  BUN 54*  --  53*  CREATININE 3.27*  --  2.83*  CALCIUM 9.5  --  9.3   GFR: CrCl cannot be calculated (Unknown ideal weight.). Liver Function Tests: Recent Labs  Lab 03/21/19 1708  AST 12*  ALT 16  ALKPHOS 140*  BILITOT 3.1*  PROT 8.3*  ALBUMIN 4.1   Recent Labs  Lab 03/21/19 1708  LIPASE 38   No results for input(s): AMMONIA in the last 168 hours. Coagulation Profile: No results for input(s): INR, PROTIME in the last 168 hours. Cardiac Enzymes: No results for input(s): CKTOTAL, CKMB, CKMBINDEX, TROPONINI  in the last 168 hours. BNP (last 3 results) No results for input(s): PROBNP in the last 8760 hours. HbA1C: No results for input(s): HGBA1C in the last 72 hours. CBG: Recent Labs  Lab 03/21/19 1705  GLUCAP 568*   Lipid Profile: No results for input(s): CHOL, HDL, LDLCALC, TRIG, CHOLHDL, LDLDIRECT in the last 72 hours. Thyroid Function Tests: No results for input(s): TSH, T4TOTAL, FREET4, T3FREE, THYROIDAB in the last 72 hours. Anemia Panel: No results for input(s): VITAMINB12, FOLATE, FERRITIN, TIBC, IRON, RETICCTPCT in the last 72 hours. Urine analysis:    Component Value Date/Time   COLORURINE YELLOW 03/24/2016 1008   APPEARANCEUR CLEAR 03/24/2016 1008   LABSPEC 1.032 (  H) 03/24/2016 1008   PHURINE 5.0 03/24/2016 1008   GLUCOSEU >1000 (A) 03/24/2016 1008   HGBUR NEGATIVE 03/24/2016 1008   BILIRUBINUR negative 01/23/2018 1016   KETONESUR negative 01/23/2018 1016   KETONESUR >80 (A) 03/24/2016 1008   PROTEINUR negative 01/23/2018 1016   PROTEINUR NEGATIVE 03/24/2016 1008   UROBILINOGEN 1.0 01/23/2018 1016   NITRITE Negative 01/23/2018 1016   NITRITE NEGATIVE 03/24/2016 1008   LEUKOCYTESUR Negative 01/23/2018 1016   Sepsis Labs: '@LABRCNTIP'$ (procalcitonin:4,lacticidven:4) )No results found for this or any previous visit (from the past 240 hour(s)).   Radiological Exams on Admission: No results found.  EKG: Independently reviewed. Sinus tachycardia (rate 123).   Assessment/Plan   1. DKA; insulin-dependent type II DM   - Patient has hx of IDDM with A1c 6.4% in June 2019, presents in DKA after running out of insulin ~2 months ago  - He was fluid-resuscitated with NS and started on insulin infusion in ED  - Continue IVF and insulin infusion with frequent CBG's and serial chem panels, and convert to sq once DKA resolved and he is tolerating a diet    2. Acute kidney injury  - SCr is 3.27 in ED, up from 1.26 a year ago  - Likely acute prerenal azotemia in setting of DKA and  N/V  - Continue IVF, check urine chemistries, renally-dose medications, repeat chem panel   3. Hyperkalemia  - Serum potassium is 5.6 in ED  - Anticipate improvement with IVF and insulin  - Continue cardiac monitoring, follow serial chem panels, continue IVF and insulin   4. Hyperbilirubinemia  - Total bilirubin is 3.1 in ED, previously normal  - There is no abdominal tenderness  - Likely related to #1, will fractionate and trend     PPE: Mask, face shield  DVT prophylaxis: sq heparin  Code Status: Full  Family Communication: Discussed with patient  Consults called: None  Admission status: observation     Vianne Bulls, MD [facility] Pager [phone]  If 7PM-7AM, please contact night-coverage [email or web address] Password [facility]  03/21/2019, 9:57 PM

## 2019-03-21 NOTE — ED Provider Notes (Addendum)
[facility] EMERGENCY DEPARTMENT Provider Note   CSN: [id number] Arrival date & time: 03/21/19  1649     History   Chief Complaint Chief Complaint  Patient presents with  . Hyperglycemia    HPI Stanley Edwards is a 53 y.o. male with history of type 2 diabetes, DKA, hypertension presents today for nausea, vomiting, polydipsia, polyuria and generalized weakness x5 days.  Patient reports that he lost his job 2 months ago and ran out of his home insulin 7 days ago.  Symptoms above have been worsening over the past 5 days.  Patient reports history of DKA and feels that he is in DKA today.  Patient reports multiple episodes of nonbloody/nonbilious emesis worsening over the past 2 days without abdominal pain.  He reports unable to keep any fluids or solids down due to nausea and vomiting.  Patient reports fatigue and generalized weakness over the past few days, he attributes this to his decreased p.o. intake.  Patient denies fevers/chills, headache/vision changes, neck stiffness, chest pain/shortness of breath, abdominal pain, diarrhea, dysuria/hematuria, fall/injury, numbness/weakness, tingling or any additional concerns.    HPI  Past Medical History:  Diagnosis Date  . Allergy   . Diabetes mellitus without complication ([place])   . Hypertension    Pt denies htn but it was on his previous hx    Patient Active Problem List   Diagnosis Date Noted  . Hyperkalemia 03/21/2019  . Hypertension 01/01/2017  . Non-compliance 04/03/2016  . AKI (acute kidney injury) ([place]) 03/24/2016  . Hyperlipidemia 09/24/2015  . Noncompliance with medications 09/21/2015  . DM2 (diabetes mellitus, type 2) ([place]) 09/19/2015  . Hypokalemia 09/19/2015  . Anemia 09/19/2015  . DKA (diabetic ketoacidoses) ([place]) 09/16/2015    History reviewed. No pertinent surgical history.      Home Medications    Prior to Admission medications   Medication Sig Start Date End Date Taking? Authorizing  Provider  Insulin Glargine (LANTUS SOLOSTAR) 100 UNIT/ML Solostar Pen Inject 30 Units into the skin daily at 10 pm. 10/05/18  Yes Shawnee Knapp, MD  Blood Glucose Monitoring Suppl (TRUE METRIX METER) DEVI 1 each by Does not apply route 3 (three) times daily before meals. 09/21/15   Charlott Rakes, MD  Blood Glucose Monitoring Suppl (TRUE METRIX METER) w/Device KIT  09/21/15   [provider]  clotrimazole-betamethasone (LOTRISONE) cream Apply 1 application topically 2 (two) times daily. X 2 weeks, then switch to clotrimazole twice daily until completely gone. Patient not taking: Reported on 03/21/2019 02/23/18   Shawnee Knapp, MD  Continuous Blood Gluc Receiver (FREESTYLE LIBRE 14 DAY READER) DEVI 1 Units by Does not apply route as needed. 01/23/18   Shawnee Knapp, MD  Continuous Blood Gluc Sensor (FREESTYLE LIBRE 14 DAY SENSOR) MISC Apply 1 Units topically every 14 (fourteen) days. 01/23/18   Shawnee Knapp, MD  glucose blood (TRUE METRIX BLOOD GLUCOSE TEST) test strip Use 3 times daily before meals 01/01/17   Shawnee Knapp, MD  Insulin Pen Needle 31G X 5 MM MISC 1 each by Does not apply route daily. 01/23/18   Shawnee Knapp, MD  metFORMIN (GLUCOPHAGE) 500 MG tablet Take 2 tablets (1,000 mg total) by mouth 2 (two) times daily with a meal. Needs office visit for more refills Patient not taking: Reported on 03/21/2019 04/14/18   Shawnee Knapp, MD  nystatin (MYCOSTATIN/NYSTOP) powder Apply topically 4 (four) times daily. Patient not taking: Reported on 03/21/2019 01/23/18   Delman Cheadle  N, MD  TRUEPLUS LANCETS 28G MISC 1 each by Does not apply route 3 (three) times daily before meals. 01/01/17   Shaw, Eva N, MD    Family History Family History  Problem Relation Age of Onset  . Cancer Mother   . Diabetes Father     Social History Social History   Tobacco Use  . Smoking status: Former Smoker    Types: Cigarettes  . Smokeless tobacco: Never Used  Substance Use Topics  . Alcohol use: Yes    Alcohol/week: 1.0  standard drinks    Types: 1 Cans of beer per week  . Drug use: No     Allergies   Patient has no known allergies.   Review of Systems Review of Systems Ten systems are reviewed and are negative for acute change except as noted in the HPI   Physical Exam Updated Vital Signs BP 116/64   Pulse (!) 117   Temp 98.2 F (36.8 C) (Oral)   Resp 16   Ht 6' 2" (1.88 m)   SpO2 97%   BMI 27.55 kg/m   Physical Exam Constitutional:      General: He is not in acute distress.    Appearance: Normal appearance. He is well-developed. He is not ill-appearing or diaphoretic.     Comments: Tired appearing  HENT:     Head: Normocephalic and atraumatic.     Right Ear: External ear normal.     Left Ear: External ear normal.     Nose: Nose normal.  Eyes:     General: Vision grossly intact. Gaze aligned appropriately.     Pupils: Pupils are equal, round, and reactive to light.  Neck:     Musculoskeletal: Normal range of motion.     Trachea: Trachea and phonation normal. No tracheal deviation.  Pulmonary:     Effort: Pulmonary effort is normal. No respiratory distress.  Abdominal:     General: There is no distension.     Palpations: Abdomen is soft.     Tenderness: There is no abdominal tenderness. There is no guarding or rebound.  Musculoskeletal: Normal range of motion.  Skin:    General: Skin is warm and dry.  Neurological:     Mental Status: He is alert.     GCS: GCS eye subscore is 4. GCS verbal subscore is 5. GCS motor subscore is 6.     Comments: Speech is clear and goal oriented, follows commands Major Cranial nerves without deficit, no facial droop Moves extremities without ataxia, coordination intact  Psychiatric:        Behavior: Behavior normal.    ED Treatments / Results  Labs (all labs ordered are listed, but only abnormal results are displayed) Labs Reviewed  CBC WITH DIFFERENTIAL/PLATELET - Abnormal; Notable for the following components:      Result Value   WBC  15.4 (*)    MCV 110.8 (*)    MCH 34.9 (*)    Neutro Abs 14.1 (*)    Lymphs Abs 0.6 (*)    Abs Immature Granulocytes 0.08 (*)    All other components within normal limits  BASIC METABOLIC PANEL - Abnormal; Notable for the following components:   Potassium 5.6 (*)    CO2 12 (*)    Glucose, Bld 642 (*)    BUN 54 (*)    Creatinine, Ser 3.27 (*)    GFR calc non Af Amer 20 (*)    GFR calc Af Amer 24 (*)      Anion gap 28 (*)    All other components within normal limits  HEPATIC FUNCTION PANEL - Abnormal; Notable for the following components:   Total Protein 8.3 (*)    AST 12 (*)    Alkaline Phosphatase 140 (*)    Total Bilirubin 3.1 (*)    Bilirubin, Direct 0.3 (*)    Indirect Bilirubin 2.8 (*)    All other components within normal limits  BASIC METABOLIC PANEL - Abnormal; Notable for the following components:   Potassium 5.6 (*)    CO2 11 (*)    Glucose, Bld 574 (*)    BUN 53 (*)    Creatinine, Ser 2.83 (*)    GFR calc non Af Amer 24 (*)    GFR calc Af Amer 28 (*)    Anion gap 27 (*)    All other components within normal limits  CBG MONITORING, ED - Abnormal; Notable for the following components:   Glucose-Capillary 568 (*)    All other components within normal limits  POCT I-STAT EG7 - Abnormal; Notable for the following components:   pH, Ven 7.123 (*)    pCO2, Ven 31.4 (*)    pO2, Ven 49.0 (*)    Bicarbonate 10.3 (*)    TCO2 11 (*)    Acid-base deficit 18.0 (*)    Potassium >8.5 (*)    Calcium, Ion 1.11 (*)    All other components within normal limits  SARS CORONAVIRUS 2  LIPASE, BLOOD  URINALYSIS, ROUTINE W REFLEX MICROSCOPIC  I-STAT VENOUS BLOOD GAS, ED    EKG EKG Interpretation  Date/Time:  Monday March 21 2019 18:50:24 EDT Ventricular Rate:  123 PR Interval:    QRS Duration: 84 QT Interval:  311 QTC Calculation: 445 R Axis:   78 Text Interpretation:  Sinus tachycardia Borderline T wave abnormalities Confirmed by Gerlene Fee [phone]) on 03/21/2019  6:53:39 PM   Radiology No results found.  Procedures .Critical Care Performed by: Deliah Boston, PA-C Authorized by: Deliah Boston, PA-C   Critical care provider statement:    Critical care time (minutes):  45   Critical care was necessary to treat or prevent imminent or life-threatening deterioration of the following conditions:  Metabolic crisis (DKA)   Critical care was time spent personally by me on the following activities:  Discussions with consultants, evaluation of patient's response to treatment, examination of patient, ordering and performing treatments and interventions, ordering and review of laboratory studies, ordering and review of radiographic studies, pulse oximetry, re-evaluation of patient's condition, obtaining history from patient or surrogate, review of old charts and development of treatment plan with patient or surrogate   (including critical care time)  Medications Ordered in ED Medications  insulin regular, human (MYXREDLIN) 100 units/ 100 mL infusion (has no administration in time range)  dextrose 5 %-0.45 % sodium chloride infusion (has no administration in time range)  sodium chloride 0.9 % bolus 1,000 mL (0 mLs Intravenous Stopped 03/21/19 1914)  sodium chloride 0.9 % bolus 1,000 mL (0 mLs Intravenous Stopped 03/21/19 1914)  calcium gluconate 1 g in sodium chloride 0.9 % 100 mL IVPB (1 g Intravenous New Bag/Given 03/21/19 1935)     Initial Impression / Assessment and Plan / ED Course  I have reviewed the triage vital signs and the nursing notes.  Pertinent labs & imaging results that were available during my care of the patient were reviewed by me and considered in my medical decision making (see chart for details).  BMP with glucose 642, potassium 5.6, creatinine 3.27, bicarb 12, anion gap 28 CBC with leukocytosis of 15.4 CBG 568 EG7: pH 7.123, potassium greater than 8.5 Urinalysis pending COVID 19 pending - IV fluids, insulin and  dextrose ordered.  Patient with elevated potassium does not need supplementation at this time.  Do not believe patient's potassium is greater than 8.5, discussed with Dr. Bero we lean towards BMP potassium of 5.6 as true BMP today.  Discussed with hospitalist for admission was asked for recheck of potassium prior to admission. - EKG: Sinus tachycardia Borderline T wave abnormalities Confirmed by Bero, Michael ([id number]) on 03/21/2019 6:53:39 PM  IV calcium gluconate 1 g ordered. - Repeat BMP with potassium 5.6, glucose 574, creatinine 2.83, anion gap 27.  Patient reassessed stable, still states he is feeling somewhat better but overall unwell he has no specific complaints, he states understanding of need for admission and has no further questions or concerns. - Discussed case with hospitalist Dr. Opyd will be seeing patient for admission.  Patient was seen and evaluated by Dr. Bero during this visit.  Note: Portions of this report may have been transcribed using voice recognition software. Every effort was made to ensure accuracy; however, inadvertent computerized transcription errors may still be present. Final Clinical Impressions(s) / ED Diagnoses   Final diagnoses:  Diabetic ketoacidosis without coma associated with type 2 diabetes mellitus (HCC)    ED Discharge Orders    None       Morelli, Brandon A, PA-C 03/21/19 2155    Morelli, Brandon A, PA-C 03/21/19 2156    Bero, Michael M, MD 03/22/19 1310  

## 2019-03-21 NOTE — ED Notes (Signed)
Critical value:  CBG 574 mg/dl Potassium 5.6 (hemolyzed)  PA made aware.

## 2019-03-22 LAB — BASIC METABOLIC PANEL
Anion gap: 17 — ABNORMAL HIGH (ref 5–15)
Anion gap: 14 (ref 5–15)
Anion gap: 12 (ref 5–15)
BUN: 48 mg/dL — ABNORMAL HIGH (ref 6–20)
BUN: 43 mg/dL — ABNORMAL HIGH (ref 6–20)
BUN: 38 mg/dL — ABNORMAL HIGH (ref 6–20)
CO2: 14 mmol/L — ABNORMAL LOW (ref 22–32)
CO2: 20 mmol/L — ABNORMAL LOW (ref 22–32)
CO2: 23 mmol/L (ref 22–32)
Calcium: 9.6 mg/dL (ref 8.9–10.3)
Calcium: 9.9 mg/dL (ref 8.9–10.3)
Calcium: 9.6 mg/dL (ref 8.9–10.3)
Chloride: 117 mmol/L — ABNORMAL HIGH (ref 98–111)
Chloride: 116 mmol/L — ABNORMAL HIGH (ref 98–111)
Chloride: 116 mmol/L — ABNORMAL HIGH (ref 98–111)
Creatinine, Ser: 2.56 mg/dL — ABNORMAL HIGH (ref 0.61–1.24)
Creatinine, Ser: 1.91 mg/dL — ABNORMAL HIGH (ref 0.61–1.24)
Creatinine, Ser: 1.59 mg/dL — ABNORMAL HIGH (ref 0.61–1.24)
GFR calc Af Amer: 32 mL/min — ABNORMAL LOW (ref >60)
GFR calc Af Amer: 45 mL/min — ABNORMAL LOW (ref >60)
GFR calc Af Amer: 56 mL/min — ABNORMAL LOW (ref >60)
GFR calc non Af Amer: 27 mL/min — ABNORMAL LOW (ref >60)
GFR calc non Af Amer: 39 mL/min — ABNORMAL LOW (ref >60)
GFR calc non Af Amer: 48 mL/min — ABNORMAL LOW (ref >60)
Glucose, Bld: 290 mg/dL — ABNORMAL HIGH (ref 70–99)
Glucose, Bld: 185 mg/dL — ABNORMAL HIGH (ref 70–99)
Glucose, Bld: 121 mg/dL — ABNORMAL HIGH (ref 70–99)
Potassium: 4.2 mmol/L (ref 3.5–5.1)
Potassium: 4.2 mmol/L (ref 3.5–5.1)
Potassium: 3.6 mmol/L (ref 3.5–5.1)
Sodium: 148 mmol/L — ABNORMAL HIGH (ref 135–145)
Sodium: 150 mmol/L — ABNORMAL HIGH (ref 135–145)
Sodium: 151 mmol/L — ABNORMAL HIGH (ref 135–145)

## 2019-03-22 LAB — GLUCOSE, CAPILLARY
Glucose-Capillary: 373 mg/dL — ABNORMAL HIGH (ref 70–99)
Glucose-Capillary: 310 mg/dL — ABNORMAL HIGH (ref 70–99)
Glucose-Capillary: 251 mg/dL — ABNORMAL HIGH (ref 70–99)
Glucose-Capillary: 258 mg/dL — ABNORMAL HIGH (ref 70–99)
Glucose-Capillary: 205 mg/dL — ABNORMAL HIGH (ref 70–99)
Glucose-Capillary: 174 mg/dL — ABNORMAL HIGH (ref 70–99)
Glucose-Capillary: 148 mg/dL — ABNORMAL HIGH (ref 70–99)
Glucose-Capillary: 153 mg/dL — ABNORMAL HIGH (ref 70–99)
Glucose-Capillary: 164 mg/dL — ABNORMAL HIGH (ref 70–99)
Glucose-Capillary: 164 mg/dL — ABNORMAL HIGH (ref 70–99)
Glucose-Capillary: 144 mg/dL — ABNORMAL HIGH (ref 70–99)
Glucose-Capillary: 128 mg/dL — ABNORMAL HIGH (ref 70–99)
Glucose-Capillary: 116 mg/dL — ABNORMAL HIGH (ref 70–99)
Glucose-Capillary: 118 mg/dL — ABNORMAL HIGH (ref 70–99)
Glucose-Capillary: 104 mg/dL — ABNORMAL HIGH (ref 70–99)
Glucose-Capillary: 132 mg/dL — ABNORMAL HIGH (ref 70–99)

## 2019-03-22 LAB — CBG MONITORING, ED
Glucose-Capillary: 333 mg/dL — ABNORMAL HIGH (ref 70–99)
Glucose-Capillary: 466 mg/dL — ABNORMAL HIGH (ref 70–99)
Glucose-Capillary: 502 mg/dL (ref 70–99)

## 2019-03-22 LAB — POCT I-STAT EG7
Acid-base deficit: 18 mmol/L — ABNORMAL HIGH (ref 0.0–2.0)
Bicarbonate: 10.3 mmol/L — ABNORMAL LOW (ref 20.0–28.0)
Calcium, Ion: 1.11 mmol/L — ABNORMAL LOW (ref 1.15–1.40)
HCT: 45 % (ref 39.0–52.0)
Hemoglobin: 15.3 g/dL (ref 13.0–17.0)
O2 Saturation: 72 %
Potassium: >8.5 mmol/L (ref 3.5–5.1)
Sodium: 141 mmol/L (ref 135–145)
TCO2: 11 mmol/L — ABNORMAL LOW (ref 22–32)
pCO2, Ven: 31.4 mmHg — ABNORMAL LOW (ref 44.0–60.0)
pH, Ven: 7.123 — CL (ref 7.250–7.430)
pO2, Ven: 49 mmHg — ABNORMAL HIGH (ref 32.0–45.0)

## 2019-03-22 LAB — CBC WITH DIFFERENTIAL/PLATELET
Abs Immature Granulocytes: 0.14 10*3/uL — ABNORMAL HIGH (ref 0.00–0.07)
Basophils Absolute: 0 10*3/uL (ref 0.0–0.1)
Basophils Relative: 0 %
Eosinophils Absolute: 0 10*3/uL (ref 0.0–0.5)
Eosinophils Relative: 0 %
HCT: 47.3 % (ref 39.0–52.0)
Hemoglobin: 15.5 g/dL (ref 13.0–17.0)
Immature Granulocytes: 1 %
Lymphocytes Relative: 6 %
Lymphs Abs: 1.1 10*3/uL (ref 0.7–4.0)
MCH: 35.1 pg — ABNORMAL HIGH (ref 26.0–34.0)
MCHC: 32.8 g/dL (ref 30.0–36.0)
MCV: 107.3 fL — ABNORMAL HIGH (ref 80.0–100.0)
Monocytes Absolute: 0.9 10*3/uL (ref 0.1–1.0)
Monocytes Relative: 5 %
Neutro Abs: 15.5 10*3/uL — ABNORMAL HIGH (ref 1.7–7.7)
Neutrophils Relative %: 88 %
Platelets: 378 10*3/uL (ref 150–400)
RBC: 4.41 MIL/uL (ref 4.22–5.81)
RDW: 12 % (ref 11.5–15.5)
WBC: 17.7 10*3/uL — ABNORMAL HIGH (ref 4.0–10.5)
nRBC: 0 % (ref 0.0–0.2)

## 2019-03-22 LAB — HEPATIC FUNCTION PANEL
ALT: 14 U/L (ref 0–44)
AST: 12 U/L — ABNORMAL LOW (ref 15–41)
Albumin: 4.4 g/dL (ref 3.5–5.0)
Alkaline Phosphatase: 138 U/L — ABNORMAL HIGH (ref 38–126)
Bilirubin, Direct: 0.3 mg/dL — ABNORMAL HIGH (ref 0.0–0.2)
Indirect Bilirubin: 2.5 mg/dL — ABNORMAL HIGH (ref 0.3–0.9)
Total Bilirubin: 2.8 mg/dL — ABNORMAL HIGH (ref 0.3–1.2)
Total Protein: 8.7 g/dL — ABNORMAL HIGH (ref 6.5–8.1)

## 2019-03-22 LAB — CREATININE, URINE, RANDOM: Creatinine, Urine: 48.98 mg/dL

## 2019-03-22 LAB — HEMOGLOBIN A1C
Hgb A1c MFr Bld: 10.8 % — ABNORMAL HIGH (ref 4.8–5.6)
Mean Plasma Glucose: 263.26 mg/dL

## 2019-03-22 LAB — URINALYSIS, ROUTINE W REFLEX MICROSCOPIC
Bacteria, UA: NONE SEEN
Bilirubin Urine: NEGATIVE
Glucose, UA: >=500 mg/dL — AB
Ketones, ur: 80 mg/dL — AB
Leukocytes,Ua: NEGATIVE
Nitrite: NEGATIVE
Protein, ur: NEGATIVE mg/dL
Specific Gravity, Urine: 1.025 (ref 1.005–1.030)
pH: 5 (ref 5.0–8.0)

## 2019-03-22 LAB — MRSA PCR SCREENING: MRSA by PCR: NEGATIVE

## 2019-03-22 LAB — SODIUM, URINE, RANDOM: Sodium, Ur: 27 mmol/L

## 2019-03-22 LAB — SARS CORONAVIRUS 2 (TAT 6-24 HRS): SARS Coronavirus 2: NEGATIVE

## 2019-03-22 LAB — HIV ANTIBODY (ROUTINE TESTING W REFLEX): HIV Screen 4th Generation wRfx: NONREACTIVE

## 2019-03-22 MED ORDER — INSULIN GLARGINE 100 UNIT/ML ~~LOC~~ SOLN
30.0000 [IU] | Freq: Every day | SUBCUTANEOUS | Status: DC
  Administered 2019-03-22 – 2019-03-23 (×2): 30 [IU] via SUBCUTANEOUS
  Filled 2019-03-22 (×3): qty 0.3

## 2019-03-22 MED ORDER — INSULIN ASPART 100 UNIT/ML ~~LOC~~ SOLN
0.0000 [IU] | Freq: Three times a day (TID) | SUBCUTANEOUS | Status: DC
  Administered 2019-03-23: 3 [IU] via SUBCUTANEOUS
  Administered 2019-03-23: 8 [IU] via SUBCUTANEOUS
  Administered 2019-03-24: 3 [IU] via SUBCUTANEOUS
  Administered 2019-03-24: 11 [IU] via SUBCUTANEOUS

## 2019-03-22 MED ORDER — SODIUM CHLORIDE 0.45 % IV SOLN
INTRAVENOUS | Status: DC
  Administered 2019-03-22 – 2019-03-24 (×4): via INTRAVENOUS

## 2019-03-22 MED ORDER — INSULIN ASPART 100 UNIT/ML ~~LOC~~ SOLN
0.0000 [IU] | Freq: Every day | SUBCUTANEOUS | Status: DC
  Administered 2019-03-23: 2 [IU] via SUBCUTANEOUS

## 2019-03-22 MED ORDER — DEXTROSE-NACL 5-0.45 % IV SOLN
INTRAVENOUS | Status: DC
  Administered 2019-03-22 (×2): via INTRAVENOUS

## 2019-03-22 MED ORDER — HEPARIN SODIUM (PORCINE) 5000 UNIT/ML IJ SOLN
5000.0000 [IU] | Freq: Three times a day (TID) | INTRAMUSCULAR | Status: DC
  Administered 2019-03-22 – 2019-03-24 (×6): 5000 [IU] via SUBCUTANEOUS
  Filled 2019-03-22 (×8): qty 1

## 2019-03-22 MED ORDER — INSULIN REGULAR(HUMAN) IN NACL 100-0.9 UT/100ML-% IV SOLN
INTRAVENOUS | Status: DC
  Administered 2019-03-22: 13.9 [IU]/h via INTRAVENOUS
  Filled 2019-03-22: qty 100

## 2019-03-22 NOTE — ED Notes (Signed)
Insulin verified by Aletha Halim, RN

## 2019-03-22 NOTE — ED Notes (Signed)
Patient requires frequent reminders to keep monitoring equipment on.

## 2019-03-22 NOTE — Progress Notes (Signed)
Patient arrived to unit from emergency department and assisted to bed by nursing staff.Patient alert and oriented x 4 .Oriented patient to nurse call bell and unit.Educated patient not to get out of bed without assistance from nursing staff and patient verbalized understanding.No acute distress noted at present time.Will continue to monitor patient.

## 2019-03-22 NOTE — Progress Notes (Addendum)
NCM received consult : Lost job and no insulin x 2 months. Went to [facility] in the past. NCM attempted to talk with pt, attempt unsuccessful. Disinterested in talking.  NCM arranged post hospital f/u @ [facility] for 04/19/2019 @ 9:30 am with Dr. Margarita Rana, noted on AVS.  NCM to f/u with TOC needs ... hoping to made referrals  from [facility] if consent given by pt. Pt will probably need Match Letter @ d/c to assist with Rx med needs. MD please send Rx meds to [facility] prior to d/c.  Whitman Hero RN,BSN,CM [phone]

## 2019-03-22 NOTE — Progress Notes (Signed)
PROGRESS NOTE    Stanley Edwards  VEL:[id number] DOB: 1966/01/28 DOA: 03/21/2019 PCP: Shawnee Knapp, MD  Brief Narrative: 53 year old male history of type 2 diabetes admitted with DKA after running out of insulin approximately 2 months ago, in the emergency room found to be tachycardic, blood glucose is 642 bicarb was 12 anion gap was 28, creatinine 3.12. -Admitted started on IV fluids and insulin drip  Assessment & Plan:   Diabetic ketoacidosis -Secondary to running out of insulin 2 months ago, very poor historian, refuses to communicate this morning -Continue insulin drip and IV fluids per protocol, monitor B met every 4 -Transition to Lantus and sliding scale once anion gap closes  Acute kidney injury -Creatinine 3.2 on admission from baseline of less than 1, likely prerenal in the setting of DKA and fluid loss -Continue aggressive hydration -Creatinine improving, monitor  Hyperkalemia from AKI -Improving, monitor  Elevated bilirubin -Predominantly indirect on fractionation -Monitor, RBC morphology was unremarkable on smear,  -Monitor with hydration and treatment of DKA, if persists consider hemoglobin electrophoresis  DVT prophylaxis: Heparin subcutaneous Code Status: Full code  Family Communication: No family at bedside Disposition Plan: Home pending improvement  Consultants:      Procedures:   Antimicrobials:    Subjective: -Strange affect and behavior, refuses to communicate with me and RN, opened eyes and then turned around and covered himself with a blanket  Objective: Vitals:   03/22/19 0522 03/22/19 0600 03/22/19 0736 03/22/19 0802  BP: 123/80 127/76  106/63  Pulse: (!) 117 (!) 109  (!) 108  Resp: '18 19 19   '$ Temp: 99 F (37.2 C) 98.2 F (36.8 C)  98.4 F (36.9 C)  TempSrc: Oral Oral  Oral  SpO2: 100% 100%  99%  Weight: 79 kg     Height: '6\' 2"'$  (1.88 m)       Intake/Output Summary (Last 24 hours) at 03/22/2019 1114 Last data filed at 03/21/2019 2159  Gross per 24 hour  Intake 2100 ml  Output -  Net 2100 ml   Filed Weights   03/22/19 0522  Weight: 79 kg    Examination:  General exam: Awake alert, withdrawn, would not communicate Respiratory system: Clear bilaterally Cardiovascular system: S1 & S2 heard, RRR. Gastrointestinal system: Abdomen is nondistended, soft and nontender.Normal bowel sounds heard. Extremities: No edema  skin: No rashes, lesions or ulcers Psychiatry: Unable to assess.     Data Reviewed:   CBC: Recent Labs  Lab 03/21/19 1708 03/21/19 1807 03/22/19 0400  WBC 15.4*  --  17.7*  NEUTROABS 14.1*  --  15.5*  HGB 14.9 15.3 15.5  HCT 47.3 45.0 47.3  MCV 110.8*  --  107.3*  PLT 397  --  258   Basic Metabolic Panel: Recent Labs  Lab 03/21/19 1708 03/21/19 1807 03/21/19 1950 03/22/19 0653  NA 145 141 145 148*  K 5.6* >8.5* 5.6* 4.2  CL 105  --  107 117*  CO2 12*  --  11* 14*  GLUCOSE 642*  --  574* 290*  BUN 54*  --  53* 48*  CREATININE 3.27*  --  2.83* 2.56*  CALCIUM 9.5  --  9.3 9.6   GFR: Estimated Creatinine Clearance: 36.9 mL/min (A) (by C-G formula based on SCr of 2.56 mg/dL (H)). Liver Function Tests: Recent Labs  Lab 03/21/19 1708 03/22/19 0400  AST 12* 12*  ALT 16 14  ALKPHOS 140* 138*  BILITOT 3.1* 2.8*  PROT 8.3* 8.7*  ALBUMIN 4.1 4.4  Recent Labs  Lab 03/21/19 1708  LIPASE 38   No results for input(s): AMMONIA in the last 168 hours. Coagulation Profile: No results for input(s): INR, PROTIME in the last 168 hours. Cardiac Enzymes: No results for input(s): CKTOTAL, CKMB, CKMBINDEX, TROPONINI in the last 168 hours. BNP (last 3 results) No results for input(s): PROBNP in the last 8760 hours. HbA1C: Recent Labs    03/22/19 0410  HGBA1C 10.8*   CBG: Recent Labs  Lab 03/22/19 0553 03/22/19 0653 03/22/19 0756 03/22/19 0908 03/22/19 1005  GLUCAP 310* 251* 258* 205* 174*   Lipid Profile: No results for input(s): CHOL, HDL, LDLCALC, TRIG, CHOLHDL, LDLDIRECT  in the last 72 hours. Thyroid Function Tests: No results for input(s): TSH, T4TOTAL, FREET4, T3FREE, THYROIDAB in the last 72 hours. Anemia Panel: No results for input(s): VITAMINB12, FOLATE, FERRITIN, TIBC, IRON, RETICCTPCT in the last 72 hours. Urine analysis:    Component Value Date/Time   COLORURINE YELLOW 03/22/2019 [place] 03/22/2019 0243   LABSPEC 1.025 03/22/2019 0243   PHURINE 5.0 03/22/2019 0243   GLUCOSEU >=500 (A) 03/22/2019 0243   HGBUR SMALL (A) 03/22/2019 [place] 03/22/2019 0243   BILIRUBINUR negative 01/23/2018 1016   KETONESUR 80 (A) 03/22/2019 0243   PROTEINUR NEGATIVE 03/22/2019 0243   UROBILINOGEN 1.0 01/23/2018 1016   NITRITE NEGATIVE 03/22/2019 0243   LEUKOCYTESUR NEGATIVE 03/22/2019 0243   Sepsis Labs: '@LABRCNTIP'$ (procalcitonin:4,lacticidven:4)  ) Recent Results (from the past 240 hour(s))  SARS CORONAVIRUS 2 (TAT 6-12 HRS)     Status: None   Collection Time: 03/21/19  8:00 PM  Result Value Ref Range Status   SARS Coronavirus 2 NEGATIVE NEGATIVE Final    Comment: (NOTE) SARS-CoV-2 target nucleic acids are NOT DETECTED. The SARS-CoV-2 RNA is generally detectable in upper and lower respiratory specimens during the acute phase of infection. Negative results do not preclude SARS-CoV-2 infection, do not rule out co-infections with other pathogens, and should not be used as the sole basis for treatment or other patient management decisions. Negative results must be combined with clinical observations, patient history, and epidemiological information. The expected result is Negative. Fact Sheet for Patients: SugarRoll.be Fact Sheet for Healthcare Providers: https://www.woods-mathews.com/ This test is not yet approved or cleared by the [place] FDA and  has been authorized for detection and/or diagnosis of SARS-CoV-2 by FDA under an Emergency Use Authorization (EUA). This  EUA will remain  in effect (meaning this test can be used) for the duration of the COVID-19 declaration under Section 56 4(b)(1) of the Act, 21 U.S.C. section 360bbb-3(b)(1), unless the authorization is terminated or revoked sooner. Performed at [facility], [place] [address], [place], [place] [address]   MRSA PCR Screening     Status: None   Collection Time: 03/22/19  5:43 AM   Specimen: Nasal Mucosa; Nasopharyngeal  Result Value Ref Range Status   MRSA by PCR NEGATIVE NEGATIVE Final    Comment:        The GeneXpert MRSA Assay (FDA approved for NASAL specimens only), is one component of a comprehensive MRSA colonization surveillance program. It is not intended to diagnose MRSA infection nor to guide or monitor treatment for MRSA infections. Performed at [facility], [place] [address] [place], [place] [address]          Radiology Studies: No results found.      Scheduled Meds: . heparin  5,000 Units Subcutaneous Q8H   Continuous Infusions: . sodium chloride  125 mL/hr (03/22/19 0436)  . sodium chloride    . dextrose 5 % and 0.45% NaCl 100 mL/hr at 03/22/19 0911  . insulin 9.1 Units/hr (03/22/19 1007)     LOS: 0 days    Time spent: 74mn    PDomenic Polite MD [facility]  03/22/2019, 11:14 AM

## 2019-03-22 NOTE — ED Notes (Signed)
ED TO INPATIENT HANDOFF REPORT  ED Nurse Name and Phone #:  [phone]  S Name/Age/Gender Stanley Edwards 53 y.o. male Room/Bed: 026C/026C  Code Status   Code Status: Full Code  Home/SNF/Other Home Patient oriented to: self, place and time Is this baseline? Yes   Triage Complete: Triage complete  Chief Complaint hyperglycemia  Triage Note Pt recently lost job and has been out of insulin x 5 days. Pt has polyuria, n/v since yesterday, generalized weakness. Denies pain.    Allergies No Known Allergies  Level of Care/Admitting Diagnosis ED Disposition    ED Disposition Condition Comment   Admit  [facility] [[id number]]  Level of Care: Progressive [102]  I expect the patient will be discharged within 24 hours: Yes  LOW acuity---Tx typically complete <24 hrs---ACUTE conditions typically can be evaluated <24 hours---LABS likely to return to acceptable levels <24 hours---IS near functional baseline---EXPECTED to return to current living arrangement---NOT newly hypoxic: Does not meet criteria for 5C-Observation unit  Covid Evaluation: Asymptomatic Screening Protocol (No Symptoms)  Diagnosis: DKA (diabetic ketoacidoses) [facility](HCC) [[id number]]) [[id number]]  Admitting Physician: Briscoe DeutscherPYD, TIMOTHY S [[id number]]  Attending Physician: Briscoe DeutscherPYD, TIMOTHY S [[id number]]  PT Class (Do Not Modify): Observation [104]  PT Acc Code (Do Not Modify): Observation [10022]       B Medical/Surgery History Past Medical History:  Diagnosis Date  . Allergy   . Diabetes mellitus without complication (HCC)   . Hypertension    Pt denies htn but it was on his previous hx   History reviewed. No pertinent surgical history.   A IV Location/Drains/Wounds Patient Lines/Drains/Airways Status   Active Line/Drains/Airways    Name:   Placement date:   Placement time:   Site:   Days:   Peripheral IV 03/22/19 Right;Upper Arm   03/22/19    0411    Arm   less than 1   Peripheral IV 03/22/19 Left;Upper Arm    03/22/19    0411    Arm   less than 1          Intake/Output Last 24 hours  Intake/Output Summary (Last 24 hours) at 03/22/2019 0435 Last data filed at 03/21/2019 2159 Gross per 24 hour  Intake 2100 ml  Output -  Net 2100 ml    Labs/Imaging Results for orders placed or performed during the hospital encounter of 03/21/19 (from the past 48 hour(s))  POC CBG, ED     Status: Abnormal   Collection Time: 03/21/19  5:05 PM  Result Value Ref Range   Glucose-Capillary 568 (HH) 70 - 99 mg/dL   Comment 1 Document in Chart   CBC with Differential     Status: Abnormal   Collection Time: 03/21/19  5:08 PM  Result Value Ref Range   WBC 15.4 (H) 4.0 - 10.5 K/uL   RBC 4.27 4.22 - 5.81 MIL/uL   Hemoglobin 14.9 13.0 - 17.0 g/dL   HCT 27.247.3 53.639.0 - 64.452.0 %   MCV 110.8 (H) 80.0 - 100.0 fL   MCH 34.9 (H) 26.0 - 34.0 pg   MCHC 31.5 30.0 - 36.0 g/dL   RDW 03.412.2 74.211.5 - 59.515.5 %   Platelets 397 150 - 400 K/uL   nRBC 0.0 0.0 - 0.2 %   Neutrophils Relative % 91 %   Neutro Abs 14.1 (H) 1.7 - 7.7 K/uL   Lymphocytes Relative 4 %   Lymphs Abs 0.6 (L) 0.7 - 4.0 K/uL   Monocytes Relative 4 %   Monocytes  Absolute 0.6 0.1 - 1.0 K/uL   Eosinophils Relative 0 %   Eosinophils Absolute 0.0 0.0 - 0.5 K/uL   Basophils Relative 0 %   Basophils Absolute 0.0 0.0 - 0.1 K/uL   RBC Morphology MORPHOLOGY UNREMARKABLE    Immature Granulocytes 1 %   Abs Immature Granulocytes 0.08 (H) 0.00 - 0.07 K/uL    Comment: Performed at [facility], [address] [place], [place] [address]  Basic metabolic panel     Status: Abnormal   Collection Time: 03/21/19  5:08 PM  Result Value Ref Range   Sodium 145 135 - 145 mmol/L   Potassium 5.6 (H) 3.5 - 5.1 mmol/L   Chloride 105 98 - 111 mmol/L   CO2 12 (L) 22 - 32 mmol/L   Glucose, Bld 642 (HH) 70 - 99 mg/dL    Comment: CRITICAL RESULT CALLED TO, READ BACK BY AND VERIFIED WITH: Venita Sheffield KINERTON,RN 1808 03/21/2019 WBOND    BUN 54 (H) 6 - 20 mg/dL   Creatinine, Ser 6.043.27 (H) 0.61  - 1.24 mg/dL   Calcium 9.5 8.9 - 54.010.3 mg/dL   GFR calc non Af Amer 20 (L) >60 mL/min   GFR calc Af Amer 24 (L) >60 mL/min   Anion gap 28 (H) 5 - 15    Comment: Performed at [facility], [address] [place], [place] [address]  Lipase, blood     Status: None   Collection Time: 03/21/19  5:08 PM  Result Value Ref Range   Lipase 38 11 - 51 U/L    Comment: Performed at [facility], [address] [place], [place] [address]  Hepatic function panel     Status: Abnormal   Collection Time: 03/21/19  5:08 PM  Result Value Ref Range   Total Protein 8.3 (H) 6.5 - 8.1 g/dL   Albumin 4.1 3.5 - 5.0 g/dL   AST 12 (L) 15 - 41 U/L   ALT 16 0 - 44 U/L   Alkaline Phosphatase 140 (H) 38 - 126 U/L   Total Bilirubin 3.1 (H) 0.3 - 1.2 mg/dL   Bilirubin, Direct 0.3 (H) 0.0 - 0.2 mg/dL   Indirect Bilirubin 2.8 (H) 0.3 - 0.9 mg/dL    Comment: Performed at [facility], [address] [place], [place] [address]  POCT I-Stat EG7     Status: Abnormal   Collection Time: 03/21/19  6:07 PM  Result Value Ref Range   pH, Ven 7.123 (LL) 7.250 - 7.430   pCO2, Ven 31.4 (L) 44.0 - 60.0 mmHg   pO2, Ven 49.0 (H) 32.0 - 45.0 mmHg   Bicarbonate 10.3 (L) 20.0 - 28.0 mmol/L   TCO2 11 (L) 22 - 32 mmol/L   O2 Saturation 72.0 %   Acid-base deficit 18.0 (H) 0.0 - 2.0 mmol/L   Sodium 141 135 - 145 mmol/L   Potassium >8.5 (HH) 3.5 - 5.1 mmol/L   Calcium, Ion 1.11 (L) 1.15 - 1.40 mmol/L   HCT 45.0 39.0 - 52.0 %   Hemoglobin 15.3 13.0 - 17.0 g/dL   Patient temperature HIDE    Sample type VENOUS    Comment NOTIFIED PHYSICIAN   Basic metabolic panel     Status: Abnormal   Collection Time: 03/21/19  7:50 PM  Result Value Ref Range   Sodium 145 135 - 145 mmol/L   Potassium 5.6 (H) 3.5 - 5.1 mmol/L    Comment: HEMOLYSIS AT THIS LEVEL MAY AFFECT RESULT  Chloride 107 98 - 111 mmol/L   CO2 11 (L) 22 - 32 mmol/L   Glucose, Bld 574 (HH) 70 - 99 mg/dL    Comment: CRITICAL RESULT CALLED TO, READ BACK BY  AND VERIFIED WITH: P PEREZ,RN 2037 03/21/2019 WBOND    BUN 53 (H) 6 - 20 mg/dL   Creatinine, Ser 2.83 (H) 0.61 - 1.24 mg/dL   Calcium 9.3 8.9 - 10.3 mg/dL   GFR calc non Af Amer 24 (L) >60 mL/min   GFR calc Af Amer 28 (L) >60 mL/min   Anion gap 27 (H) 5 - 15    Comment: Performed at [facility], [place] [address] [place], [place] [address]  Sodium, urine, random     Status: None   Collection Time: 03/21/19  9:58 PM  Result Value Ref Range   Sodium, Ur 27 mmol/L    Comment: Performed at [place] [address] [place], [place] [address]  Creatinine, urine, random     Status: None   Collection Time: 03/21/19  9:58 PM  Result Value Ref Range   Creatinine, Urine 48.98 mg/dL    Comment: Performed at [place] [address], [place], [place] [address]  CBG monitoring, ED     Status: Abnormal   Collection Time: 03/21/19 10:20 PM  Result Value Ref Range   Glucose-Capillary 447 (H) 70 - 99 mg/dL  CBG monitoring, ED     Status: Abnormal   Collection Time: 03/21/19 11:29 PM  Result Value Ref Range   Glucose-Capillary 458 (H) 70 - 99 mg/dL  CBG monitoring, ED     Status: Abnormal   Collection Time: 03/22/19 12:42 AM  Result Value Ref Range   Glucose-Capillary 502 (HH) 70 - 99 mg/dL  CBG monitoring, ED     Status: Abnormal   Collection Time: 03/22/19  2:40 AM  Result Value Ref Range   Glucose-Capillary 466 (H) 70 - 99 mg/dL  Urinalysis, Routine w reflex microscopic     Status: Abnormal   Collection Time: 03/22/19  2:43 AM  Result Value Ref Range   Color, Urine YELLOW YELLOW   APPearance CLEAR CLEAR   Specific Gravity, Urine 1.025 1.005 - 1.030   pH 5.0 5.0 - 8.0   Glucose, UA >=500 (A) NEGATIVE mg/dL   Hgb urine dipstick SMALL (A) NEGATIVE   Bilirubin Urine NEGATIVE NEGATIVE   Ketones, ur 80 (A) NEGATIVE mg/dL   Protein, ur NEGATIVE NEGATIVE mg/dL   Nitrite NEGATIVE NEGATIVE   Leukocytes,Ua NEGATIVE NEGATIVE   RBC / HPF 0-5 0 - 5 RBC/hpf   Bacteria,  UA NONE SEEN NONE SEEN   Squamous Epithelial / LPF 0-5 0 - 5   Mucus PRESENT     Comment: Performed at [facility], [place] [address] [place],  [address]  CBC with Differential/Platelet     Status: Abnormal   Collection Time: 03/22/19  4:00 AM  Result Value Ref Range   WBC 17.7 (H) 4.0 - 10.5 K/uL   RBC 4.41 4.22 - 5.81 MIL/uL   Hemoglobin 15.5 13.0 - 17.0 g/dL   HCT 47.3 39.0 - 52.0 %   MCV 107.3 (H) 80.0 - 100.0 fL   MCH 35.1 (H) 26.0 - 34.0 pg   MCHC 32.8 30.0 - 36.0 g/dL   RDW 12.0 11.5 - 15.5 %   Platelets 378 150 - 400 K/uL   nRBC 0.0 0.0 - 0.2 %   Neutrophils Relative % 88 %  Neutro Abs 15.5 (H) 1.7 - 7.7 K/uL   Lymphocytes Relative 6 %   Lymphs Abs 1.1 0.7 - 4.0 K/uL   Monocytes Relative 5 %   Monocytes Absolute 0.9 0.1 - 1.0 K/uL   Eosinophils Relative 0 %   Eosinophils Absolute 0.0 0.0 - 0.5 K/uL   Basophils Relative 0 %   Basophils Absolute 0.0 0.0 - 0.1 K/uL   Immature Granulocytes 1 %   Abs Immature Granulocytes 0.14 (H) 0.00 - 0.07 K/uL    Comment: Performed at [facility], [address] [place], [place] [address]  Hemoglobin A1c     Status: Abnormal   Collection Time: 03/22/19  4:10 AM  Result Value Ref Range   Hgb A1c MFr Bld 10.8 (H) 4.8 - 5.6 %    Comment: (NOTE) Pre diabetes:          5.7%-6.4% Diabetes:              >6.4% Glycemic control for   <7.0% adults with diabetes    Mean Plasma Glucose 263.26 mg/dL    Comment: Performed at [facility], [address] [place], [place] [address]   No results found.  Pending Labs Unresulted Labs (From admission, onward)    Start     Ordered   03/22/19 0500  HIV antibody (Routine Testing)  Tomorrow morning,   R     03/22/19 0302   03/22/19 0500  Hepatic function panel  Tomorrow morning,   R     03/21/19 2157   03/21/19 2000  SARS CORONAVIRUS 2 (TAT 6-12 HRS)  Once,   R     03/21/19 2000          Vitals/Pain Today's Vitals   03/21/19 1713 03/21/19 2045 03/21/19 2100  03/22/19 0341  BP:  114/69 116/64 105/74  Pulse:   (!) 117 (!) 123  Resp:   16 19  Temp:    98.3 F (36.8 C)  TempSrc:    Oral  SpO2:   97% 99%  Height:      PainSc: 0-No pain       Isolation Precautions No active isolations  Medications Medications  0.9 %  sodium chloride infusion (has no administration in time range)  dextrose 5 %-0.45 % sodium chloride infusion (has no administration in time range)  insulin regular, human (MYXREDLIN) 100 units/ 100 mL infusion (has no administration in time range)  heparin injection 5,000 Units (has no administration in time range)  0.9 %  sodium chloride infusion ( Intravenous Not Given 03/22/19 0304)  sodium chloride 0.9 % bolus 1,000 mL (0 mLs Intravenous Stopped 03/21/19 1914)  sodium chloride 0.9 % bolus 1,000 mL (0 mLs Intravenous Stopped 03/21/19 1914)  calcium gluconate 1 g in sodium chloride 0.9 % 100 mL IVPB (0 g Intravenous Stopped 03/21/19 2159)    Mobility walks Low fall risk   Focused Assessments Neuro Assessment Handoff:  Swallow screen pass? n/a         Neuro Assessment:   Neuro Checks:      Last Documented NIHSS Modified Score:   Has TPA been given? No If patient is a Neuro Trauma and patient is going to OR before floor call report to 4N Charge nurse: [phone] or [phone]     R Recommendations: See Admitting Provider Note  Report given to:   Additional Notes:  Pt states that he has been out of his insulin x9 days. Keeps removing monitoring equipment.

## 2019-03-22 NOTE — ED Notes (Signed)
Attempted to call report; nurse unable to take an will call me back.

## 2019-03-22 NOTE — Progress Notes (Addendum)
Inpatient Diabetes Program Recommendations  AACE/ADA: New Consensus Statement on Inpatient Glycemic Control (2015)  Target Ranges:  Prepandial:   less than 140 mg/dL      Peak postprandial:   less than 180 mg/dL (1-2 hours)      Critically ill patients:  140 - 180 mg/dL   Lab Results  Component Value Date   GLUCAP 205 (H) 03/22/2019   HGBA1C 10.8 (H) 03/22/2019    Review of Glycemic Control  Diabetes history: DM2 Outpatient Diabetes medications: Lantus 30 units + Metformin 1 gm bid Current orders for Inpatient glycemic control: IV insulin  Inpatient Diabetes Program Recommendations:   When patient meets criteria for transition to subcutaneous insulin, please give basal insulin 2 hrs prior to D/C of insulin drip and cover CBG with Novolog @ time IV drip. Noted patient lost his job and lost insurance to assist with insulin. May consider starting patient on Novolin 70/30 insulin from [facility] which is approximately $25 per vial or $42 for insulin pens. Referral placed for case management to assess patient for [facility] which patient has been to in the past for assistance with medications. Will follow during hospitalization.  Spoke with MD and discussed transition of insulin dose to Novolin 70/30. # [id number] Novolin 70/30 flexpen insulin 22 units ac meals bid = (30.8                      basal +13.2 units meal coverage daily) # [id number] Pen Needles  Thank you, Nani Gasser. Terreon Ekholm, RN, MSN, CDE  Diabetes Coordinator Inpatient Glycemic Control Team Team Pager [phone] (8am-5pm) 03/22/2019 10:24 AM

## 2019-03-23 LAB — GLUCOSE, CAPILLARY
Glucose-Capillary: 261 mg/dL — ABNORMAL HIGH (ref 70–99)
Glucose-Capillary: 249 mg/dL — ABNORMAL HIGH (ref 70–99)
Glucose-Capillary: 174 mg/dL — ABNORMAL HIGH (ref 70–99)
Glucose-Capillary: 201 mg/dL — ABNORMAL HIGH (ref 70–99)
Glucose-Capillary: 207 mg/dL — ABNORMAL HIGH (ref 70–99)

## 2019-03-23 LAB — COMPREHENSIVE METABOLIC PANEL
ALT: 12 U/L (ref 0–44)
AST: 23 U/L (ref 15–41)
Albumin: 3.4 g/dL — ABNORMAL LOW (ref 3.5–5.0)
Alkaline Phosphatase: 110 U/L (ref 38–126)
Anion gap: 14 (ref 5–15)
BUN: 38 mg/dL — ABNORMAL HIGH (ref 6–20)
CO2: 20 mmol/L — ABNORMAL LOW (ref 22–32)
Calcium: 9.1 mg/dL (ref 8.9–10.3)
Chloride: 113 mmol/L — ABNORMAL HIGH (ref 98–111)
Creatinine, Ser: 1.63 mg/dL — ABNORMAL HIGH (ref 0.61–1.24)
GFR calc Af Amer: 55 mL/min — ABNORMAL LOW (ref >60)
GFR calc non Af Amer: 47 mL/min — ABNORMAL LOW (ref >60)
Glucose, Bld: 219 mg/dL — ABNORMAL HIGH (ref 70–99)
Potassium: 4 mmol/L (ref 3.5–5.1)
Sodium: 147 mmol/L — ABNORMAL HIGH (ref 135–145)
Total Bilirubin: 3 mg/dL — ABNORMAL HIGH (ref 0.3–1.2)
Total Protein: 6.8 g/dL (ref 6.5–8.1)

## 2019-03-23 LAB — CBC
HCT: 36.2 % — ABNORMAL LOW (ref 39.0–52.0)
Hemoglobin: 12.1 g/dL — ABNORMAL LOW (ref 13.0–17.0)
MCH: 34.8 pg — ABNORMAL HIGH (ref 26.0–34.0)
MCHC: 33.4 g/dL (ref 30.0–36.0)
MCV: 104 fL — ABNORMAL HIGH (ref 80.0–100.0)
Platelets: 309 10*3/uL (ref 150–400)
RBC: 3.48 MIL/uL — ABNORMAL LOW (ref 4.22–5.81)
RDW: 12.2 % (ref 11.5–15.5)
WBC: 15.3 10*3/uL — ABNORMAL HIGH (ref 4.0–10.5)
nRBC: 0 % (ref 0.0–0.2)

## 2019-03-23 MED ORDER — PANTOPRAZOLE SODIUM 40 MG PO TBEC
40.0000 mg | DELAYED_RELEASE_TABLET | Freq: Every day | ORAL | Status: DC
  Administered 2019-03-23 – 2019-03-24 (×2): 40 mg via ORAL
  Filled 2019-03-23 (×2): qty 1

## 2019-03-23 MED ORDER — ENSURE ENLIVE PO LIQD
237.0000 mL | Freq: Two times a day (BID) | ORAL | Status: DC
  Administered 2019-03-24: 237 mL via ORAL

## 2019-03-23 MED ORDER — ONDANSETRON HCL 4 MG/2ML IJ SOLN: 4.0000 mg | Freq: Four times a day (QID) | INTRAMUSCULAR | Status: DC | PRN

## 2019-03-23 MED ORDER — LACTATED RINGERS IV BOLUS
500.0000 mL | Freq: Once | INTRAVENOUS | Status: AC
  Administered 2019-03-23: 03:00:00 via INTRAVENOUS

## 2019-03-23 NOTE — Progress Notes (Signed)
PROGRESS NOTE    Stanley Edwards  LZJ:[id number] DOB: [birth year] DOA: 03/21/2019 PCP: Shawnee Knapp, MD   Brief Narrative:  Patient is a 14 old male with history of insulin-dependent diabetes mellitus who presented with complaints of polyuria, nausea, vomiting.  He said he ran out of insulin 2 months ago after he lost his job due to COVID-19 pandemic.  He was found to be in DKA on presentation.  He was started on insulin drip.  Currently gap is closed.  He has been started on long-acting insulin.  Diabetic currently following.  Complains of nausea this afternoon.  Assessment & Plan:   Principal Problem:   DKA (diabetic ketoacidoses) ([place]) Active Problems:   AKI (acute kidney injury) ([place])   Hyperkalemia   Hyperbilirubinemia   Diabetic ketoacidosis: Was not taking insulin since last 2 months.  Presented with polyuria, nausea, vomiting.  Was started on insulin drip.  Currently gap is closed.  Started on long-acting insulin.  Uncontrolled diabetes: Hemoglobin A1c of 10.8.  Diabetic currently following.  He was not able to afford insulin because he lost his job.  He was on Lantus previously.  Plan for changing his insulin regimen to 70/30 insulin.  He needs to closely follow-up with his PCP for management of his diabetes.  Hypernatremia/hyperkalemia: Improving.  Continue current fluid.  Acute kidney injury: Presented with creatinine of more than 3.  Continue function slowly improving with IV fluids.  Check BMP tomorrow  Elevated bilirubin: Mild.  Continue to monitor as an outpatient          DVT prophylaxis: Heparin [place] Code Status: Full Family Communication: None present at the bed side Disposition Plan: Home tomorrow   Consultants: None  Procedures: None  Antimicrobials:  Anti-infectives (From admission, onward)   None      Subjective:  Patient seen and examined at bedside this afternoon.  Currently hemodynamically stable.  Still complains of nausea but has not  vomited.  Looks weak.  Looks better than yesterday  objective: Vitals:   03/22/19 1947 03/23/19 0022 03/23/19 0414 03/23/19 0817  BP: 122/72 125/76 127/74 115/66  Pulse: 93 (!) 103 100 91  Resp: 14 14 15 18   Temp: 98.6 F (37 C) 98.5 F (36.9 C) 98.1 F (36.7 C) 98.1 F (36.7 C)  TempSrc: Oral Oral Oral Oral  SpO2: 100% 100% 98% 100%  Weight:      Height:        Intake/Output Summary (Last 24 hours) at 03/23/2019 1526 Last data filed at 03/22/2019 1839 Gross per 24 hour  Intake 1945.5 ml  Output -  Net 1945.5 ml   Filed Weights   03/22/19 0522  Weight: 79 kg    Examination:  General exam: Appears calm and comfortable ,Not in distress,average built HEENT:PERRL,Oral mucosa moist, Ear/Nose normal on gross exam Respiratory system: Bilateral equal air entry, normal vesicular breath sounds, no wheezes or crackles  Cardiovascular system: S1 & S2 heard, RRR. No JVD, murmurs, rubs, gallops or clicks. No pedal edema. Gastrointestinal system: Abdomen is nondistended, soft and nontender. No organomegaly or masses felt. Normal bowel sounds heard. Central nervous system: Alert and oriented. No focal neurological deficits. Extremities: No edema, no clubbing ,no cyanosis, distal peripheral pulses palpable. Skin: No rashes, lesions or ulcers,no icterus ,no pallor MSK: Normal muscle bulk,tone ,power Psychiatry: Judgement and insight appear normal. Mood & affect appropriate.     Data Reviewed: I have personally reviewed following labs and imaging studies  CBC: Recent Labs  Lab  03/21/19 1708 03/21/19 1807 03/22/19 0400 03/23/19 0106  WBC 15.4*  --  17.7* 15.3*  NEUTROABS 14.1*  --  15.5*  --   HGB 14.9 15.3 15.5 12.1*  HCT 47.3 45.0 47.3 36.2*  MCV 110.8*  --  107.3* 104.0*  PLT 397  --  378 309   Basic Metabolic Panel: Recent Labs  Lab 03/21/19 1950 03/22/19 0653 03/22/19 1235 03/22/19 2001 03/23/19 0124  NA 145 148* 150* 151* 147*  K 5.6* 4.2 4.2 3.6 4.0  CL 107  117* 116* 116* 113*  CO2 11* 14* 20* 23 20*  GLUCOSE 574* 290* 185* 121* 219*  BUN 53* 48* 43* 38* 38*  CREATININE 2.83* 2.56* 1.91* 1.59* 1.63*  CALCIUM 9.3 9.6 9.9 9.6 9.1   GFR: Estimated Creatinine Clearance: 57.9 mL/min (A) (by C-G formula based on SCr of 1.63 mg/dL (H)). Liver Function Tests: Recent Labs  Lab 03/21/19 1708 03/22/19 0400 03/23/19 0124  AST 12* 12* 23  ALT 16 14 12   ALKPHOS 140* 138* 110  BILITOT 3.1* 2.8* 3.0*  PROT 8.3* 8.7* 6.8  ALBUMIN 4.1 4.4 3.4*   Recent Labs  Lab 03/21/19 1708  LIPASE 38   No results for input(s): AMMONIA in the last 168 hours. Coagulation Profile: No results for input(s): INR, PROTIME in the last 168 hours. Cardiac Enzymes: No results for input(s): CKTOTAL, CKMB, CKMBINDEX, TROPONINI in the last 168 hours. BNP (last 3 results) No results for input(s): PROBNP in the last 8760 hours. HbA1C: Recent Labs    03/22/19 0410  HGBA1C 10.8*   CBG: Recent Labs  Lab 03/22/19 1938 03/22/19 2033 03/22/19 2151 03/23/19 0809 03/23/19 1401  GLUCAP 118* 104* 132* 261* 249*   Lipid Profile: No results for input(s): CHOL, HDL, LDLCALC, TRIG, CHOLHDL, LDLDIRECT in the last 72 hours. Thyroid Function Tests: No results for input(s): TSH, T4TOTAL, FREET4, T3FREE, THYROIDAB in the last 72 hours. Anemia Panel: No results for input(s): VITAMINB12, FOLATE, FERRITIN, TIBC, IRON, RETICCTPCT in the last 72 hours. Sepsis Labs: No results for input(s): PROCALCITON, LATICACIDVEN in the last 168 hours.  Recent Results (from the past 240 hour(s))  SARS CORONAVIRUS 2 (TAT 6-12 HRS)     Status: None   Collection Time: 03/21/19  8:00 PM  Result Value Ref Range Status   SARS Coronavirus 2 NEGATIVE NEGATIVE Final    Comment: (NOTE) SARS-CoV-2 target nucleic acids are NOT DETECTED. The SARS-CoV-2 RNA is generally detectable in upper and lower respiratory specimens during the acute phase of infection. Negative results do not preclude SARS-CoV-2  infection, do not rule out co-infections with other pathogens, and should not be used as the sole basis for treatment or other patient management decisions. Negative results must be combined with clinical observations, patient history, and epidemiological information. The expected result is Negative. Fact Sheet for Patients: HairSlick.nohttps://www.fda.gov/media/138098/download Fact Sheet for Healthcare Providers: quierodirigir.comhttps://www.fda.gov/media/138095/download This test is not yet approved or cleared by the [place] FDA and  has been authorized for detection and/or diagnosis of SARS-CoV-2 by FDA under an Emergency Use Authorization (EUA). This EUA will remain  in effect (meaning this test can be used) for the duration of the COVID-19 declaration under Section 56 4(b)(1) of the Act, 21 U.S.C. section 360bbb-3(b)(1), unless the authorization is terminated or revoked sooner. Performed at [facility], [address] [place], [place] [address]   MRSA PCR Screening     Status: None   Collection Time: 03/22/19  5:43 AM   Specimen: Nasal Mucosa;  Nasopharyngeal  Result Value Ref Range Status   MRSA by PCR NEGATIVE NEGATIVE Final    Comment:        The GeneXpert MRSA Assay (FDA approved for NASAL specimens only), is one component of a comprehensive MRSA colonization surveillance program. It is not intended to diagnose MRSA infection nor to guide or monitor treatment for MRSA infections. Performed at [facility], [address] [place], [place] [address]          Radiology Studies: No results found.      Scheduled Meds: . heparin  5,000 Units Subcutaneous Q8H  . insulin aspart  0-15 Units Subcutaneous TID WC  . insulin aspart  0-5 Units Subcutaneous QHS  . insulin glargine  30 Units Subcutaneous QHS  . pantoprazole  40 mg Oral Daily   Continuous Infusions: . sodium chloride 100 mL/hr at 03/23/19 0844     LOS: 1 day    Time spent: 35 mins.More than 50% of that  time was spent in counseling and/or coordination of care.      Burnadette Pop, MD [facility] Pager [phone]  If 7PM-7AM, please contact night-coverage [email or web address] Password [facility] 03/23/2019, 3:26 PM

## 2019-03-23 NOTE — Plan of Care (Signed)
  Problem: Education: Goal: Ability to describe self-care measures that may prevent or decrease complications (Diabetes Survival Skills Education) will improve Outcome: Progressing Goal: Individualized Educational Video(s) Outcome: Progressing   Problem: Cardiac: Goal: Ability to maintain an adequate cardiac output will improve Outcome: Progressing   Problem: Health Behavior/Discharge Planning: Goal: Ability to identify and utilize available resources and services will improve Outcome: Progressing Goal: Ability to manage health-related needs will improve Outcome: Progressing   Problem: Fluid Volume: Goal: Ability to achieve a balanced intake and output will improve Outcome: Progressing   Problem: Metabolic: Goal: Ability to maintain appropriate glucose levels will improve Outcome: Progressing   Problem: Nutritional: Goal: Maintenance of adequate nutrition will improve Outcome: Progressing Goal: Maintenance of adequate weight for body size and type will improve Outcome: Progressing   Problem: Respiratory: Goal: Will regain and/or maintain adequate ventilation Outcome: Progressing   

## 2019-03-23 NOTE — Progress Notes (Signed)
   03/23/19 2031  MEWS Score  Pulse Rate 75  BP 107/60  Temp 98.4 F (36.9 C)  SpO2 100 %  O2 Device Room Air  MEWS Score  MEWS RR 0  MEWS Pulse 0  MEWS Systolic 0  MEWS LOC 0  MEWS Temp 0  MEWS Score 0  MEWS Score Color Green  MEWS Assessment  Is this an acute change? No  MEWS Guidelines - (patients age 54 and over)  Red - At High Risk for Deterioration Yellow - At risk for Deterioration  1. Go to room and assess patient 2. Validate data. Is this patient's baseline? If data confirmed: 3. Is this an acute change? 4. Administer prn meds/treatments as ordered. 5. Note Sepsis score 6. Review goals of care 7. Sports coach, RRT nurse and Provider. 8. Ask Provider to come to bedside.  9. Document patient condition/interventions/response. 10. Increase frequency of vital signs and focused assessments to at least q15 minutes x 4, then q30 minutes x2. - If stable, then q1h x3, then q4h x3 and then q8h or dept. routine. - If unstable, contact Provider & RRT nurse. Prepare for possible transfer. 11. Add entry in progress notes using the smart phrase ".MEWS". 1. Go to room and assess patient 2. Validate data. Is this patient's baseline? If data confirmed: 3. Is this an acute change? 4. Administer prn meds/treatments as ordered? 5. Note Sepsis score 6. Review goals of care 7. Sports coach and Provider 8. Call RRT nurse as needed. 9. Document patient condition/interventions/response. 10. Increase frequency of vital signs and focused assessments to at least q2h x2. - If stable, then q4h x2 and then q8h or dept. routine. - If unstable, contact Provider & RRT nurse. Prepare for possible transfer. 11. Add entry in progress notes using the smart phrase ".MEWS".  Green - Likely stable [place] - Comfort Care Only  1. Continue routine/ordered monitoring.  2. Review goals of care. 1. Continue routine/ordered monitoring. 2. Review goals of care.

## 2019-03-23 NOTE — Plan of Care (Signed)
  Problem: Cardiac: Goal: Ability to maintain an adequate cardiac output will improve Outcome: Progressing   Problem: Respiratory: Goal: Will regain and/or maintain adequate ventilation Outcome: Progressing   Problem: Urinary Elimination: Goal: Ability to achieve and maintain adequate renal perfusion and functioning will improve Outcome: Completed/Met

## 2019-03-23 NOTE — Progress Notes (Signed)
Verbal order from Dr. Tawanna Solo to transfer pt to [facility]

## 2019-03-23 NOTE — Progress Notes (Signed)
Assumed pt care at 1530 lunch coverage insulin no administered by previous nurse will check current CBG and cover for dinner.

## 2019-03-24 LAB — BASIC METABOLIC PANEL
Anion gap: 12 (ref 5–15)
BUN: 22 mg/dL — ABNORMAL HIGH (ref 6–20)
CO2: 23 mmol/L (ref 22–32)
Calcium: 8.6 mg/dL — ABNORMAL LOW (ref 8.9–10.3)
Chloride: 108 mmol/L (ref 98–111)
Creatinine, Ser: 1.15 mg/dL (ref 0.61–1.24)
GFR calc Af Amer: >60 mL/min (ref >60)
GFR calc non Af Amer: >60 mL/min (ref >60)
Glucose, Bld: 174 mg/dL — ABNORMAL HIGH (ref 70–99)
Potassium: 3 mmol/L — ABNORMAL LOW (ref 3.5–5.1)
Sodium: 143 mmol/L (ref 135–145)

## 2019-03-24 LAB — CBC WITH DIFFERENTIAL/PLATELET
Abs Immature Granulocytes: 0.03 10*3/uL (ref 0.00–0.07)
Basophils Absolute: 0 10*3/uL (ref 0.0–0.1)
Basophils Relative: 0 %
Eosinophils Absolute: 0.1 10*3/uL (ref 0.0–0.5)
Eosinophils Relative: 1 %
HCT: 29.7 % — ABNORMAL LOW (ref 39.0–52.0)
Hemoglobin: 10.1 g/dL — ABNORMAL LOW (ref 13.0–17.0)
Immature Granulocytes: 0 %
Lymphocytes Relative: 22 %
Lymphs Abs: 2 10*3/uL (ref 0.7–4.0)
MCH: 35.1 pg — ABNORMAL HIGH (ref 26.0–34.0)
MCHC: 34 g/dL (ref 30.0–36.0)
MCV: 103.1 fL — ABNORMAL HIGH (ref 80.0–100.0)
Monocytes Absolute: 0.5 10*3/uL (ref 0.1–1.0)
Monocytes Relative: 5 %
Neutro Abs: 6.6 10*3/uL (ref 1.7–7.7)
Neutrophils Relative %: 72 %
Platelets: 244 10*3/uL (ref 150–400)
RBC: 2.88 MIL/uL — ABNORMAL LOW (ref 4.22–5.81)
RDW: 11.9 % (ref 11.5–15.5)
WBC: 9.2 10*3/uL (ref 4.0–10.5)
nRBC: 0 % (ref 0.0–0.2)

## 2019-03-24 LAB — GLUCOSE, CAPILLARY
Glucose-Capillary: 180 mg/dL — ABNORMAL HIGH (ref 70–99)
Glucose-Capillary: 338 mg/dL — ABNORMAL HIGH (ref 70–99)

## 2019-03-24 LAB — MAGNESIUM: Magnesium: 1.5 mg/dL — ABNORMAL LOW (ref 1.7–2.4)

## 2019-03-24 MED ORDER — MAGNESIUM SULFATE 2 GM/50ML IV SOLN
2.0000 g | Freq: Once | INTRAVENOUS | Status: AC
  Administered 2019-03-24: 2 g via INTRAVENOUS

## 2019-03-24 MED ORDER — INSULIN PEN NEEDLE 32G X 4 MM MISC: 1.0000 | Freq: Two times a day (BID) | 0 refills | Status: DC

## 2019-03-24 MED ORDER — NOVOLIN 70/30 FLEXPEN (70-30) 100 UNIT/ML ~~LOC~~ SUPN: 22.0000 [IU] | PEN_INJECTOR | Freq: Two times a day (BID) | SUBCUTANEOUS | 1 refills | Status: DC

## 2019-03-24 MED ORDER — POTASSIUM CHLORIDE CRYS ER 20 MEQ PO TBCR
40.0000 meq | EXTENDED_RELEASE_TABLET | ORAL | Status: AC
  Administered 2019-03-24 (×2): 40 meq via ORAL
  Filled 2019-03-24: qty 2

## 2019-03-24 MED ORDER — BLOOD GLUCOSE MONITOR KIT: PACK | 0 refills | Status: DC

## 2019-03-24 MED FILL — PENTIPS 32G X 4 MM MISC: 32G X 4 MM | 30 days supply | Qty: 100 | Fill #0

## 2019-03-24 MED FILL — NOVOLIN 70/30 FLEXPEN (70-3: (70-30) 100 | 30 days supply | Qty: 15 | Fill #0

## 2019-03-24 NOTE — Discharge Instructions (Signed)
Blood Glucose Monitoring, Adult °Monitoring your blood sugar (glucose) is an important part of managing your diabetes (diabetes mellitus). Blood glucose monitoring involves checking your blood glucose as often as directed and keeping a record (log) of your results over time. °Checking your blood glucose regularly and keeping a blood glucose log can: °· Help you and your health care provider adjust your diabetes management plan as needed, including your medicines or insulin. °· Help you understand how food, exercise, illnesses, and medicines affect your blood glucose. °· Let you know what your blood glucose is at any time. You can quickly find out if you have low blood glucose (hypoglycemia) or high blood glucose (hyperglycemia). °Your health care provider will set individualized treatment goals for you. Your goals will be based on your age, other medical conditions you have, and how you respond to diabetes treatment. Generally, the goal of treatment is to maintain the following blood glucose levels: °· Before meals (preprandial): 80-130 mg/dL (4.4-7.2 mmol/L). °· After meals (postprandial): below 180 mg/dL (10 mmol/L). °· A1c level: less than 7%. °Supplies needed: °· Blood glucose meter. °· Test strips for your meter. Each meter has its own strips. You must use the strips that came with your meter. °· A needle to prick your finger (lancet). Do not use a lancet more than one time. °· A device that holds the lancet (lancing device). °· A journal or log book to write down your results. °How to check your blood glucose ° °1. Wash your hands with soap and water. °2. Prick the side of your finger (not the tip) with the lancet. Use a different finger each time. °3. Gently rub the finger until a small drop of blood appears. °4. Follow instructions that come with your meter for inserting the test strip, applying blood to the strip, and using your blood glucose meter. °5. Write down your result and any notes. °Some meters  allow you to use areas of your body other than your finger (alternative sites) to test your blood. The most common alternative sites are: °· Forearm. °· Thigh. °· Palm of the hand. °If you think you may have hypoglycemia, or if you have a history of not knowing when your blood glucose is getting low (hypoglycemia unawareness), do not use alternative sites. Use your finger instead. Alternative sites may not be as accurate as the fingers, because blood flow is slower in these areas. This means that the result you get may be delayed, and it may be different from the result that you would get from your finger. °Follow these instructions at home: °Blood glucose log ° °· Every time you check your blood glucose, write down your result. Also write down any notes about things that may be affecting your blood glucose, such as your diet and exercise for the day. This information can help you and your health care provider: °? Look for patterns in your blood glucose over time. °? Adjust your diabetes management plan as needed. °· Check if your meter allows you to download your records to a computer. Most glucose meters store a record of glucose readings in the meter. °If you have type 1 diabetes: °· Check your blood glucose 2 or more times a day. °· Also check your blood glucose: °? Before every insulin injection. °? Before and after exercise. °? Before meals. °? 2 hours after a meal. °? Occasionally between 2:00 a.m. and 3:00 a.m., as directed. °? Before potentially dangerous tasks, like driving or using heavy machinery. °?   At bedtime.  You may need to check your blood glucose more often, up to 6-10 times a day, if you: ? Use an insulin pump. ? Need multiple daily injections (MDI). ? Have diabetes that is not well-controlled. ? Are ill. ? Have a history of severe hypoglycemia. ? Have hypoglycemia unawareness. If you have type 2 diabetes:  If you take insulin or other diabetes medicines, check your blood glucose 2 or  more times a day.  If you are on intensive insulin therapy, check your blood glucose 4 or more times a day. Occasionally, you may also need to check between 2:00 a.m. and 3:00 a.m., as directed.  Also check your blood glucose: ? Before and after exercise. ? Before potentially dangerous tasks, like driving or using heavy machinery.  You may need to check your blood glucose more often if: ? Your medicine is being adjusted. ? Your diabetes is not well-controlled. ? You are ill. General tips  Always keep your supplies with you.  If you have questions or need help, all blood glucose meters have a 24-hour "hotline" phone number that you can call. You may also contact your health care provider.  After you use a few boxes of test strips, adjust (calibrate) your blood glucose meter by following instructions that came with your meter. Contact a health care provider if:  Your blood glucose is at or above 240 mg/dL (13.3 mmol/L) for 2 days in a row.  You have been sick or have had a fever for 2 days or longer, and you are not getting better.  You have any of the following problems for more than 6 hours: ? You cannot eat or drink. ? You have nausea or vomiting. ? You have diarrhea. Get help right away if:  Your blood glucose is lower than 54 mg/dL (3 mmol/L).  You become confused or you have trouble thinking clearly.  You have difficulty breathing.  You have moderate or large ketone levels in your urine. Summary  Monitoring your blood sugar (glucose) is an important part of managing your diabetes (diabetes mellitus).  Blood glucose monitoring involves checking your blood glucose as often as directed and keeping a record (log) of your results over time.  Your health care provider will set individualized treatment goals for you. Your goals will be based on your age, other medical conditions you have, and how you respond to diabetes treatment.  Every time you check your blood glucose,  write down your result. Also write down any notes about things that may be affecting your blood glucose, such as your diet and exercise for the day. This information is not intended to replace advice given to you by your health care provider. Make sure you discuss any questions you have with your health care provider. Document Released: 07/17/2003 Document Revised: 05/07/2018 Document Reviewed: 12/24/2015 Elsevier Patient Education  2020 [employer].   Diabetes Mellitus and Exercise Exercising regularly is important for your overall health, especially when you have diabetes (diabetes mellitus). Exercising is not only about losing weight. It has many other health benefits, such as increasing muscle strength and bone density and reducing body fat and stress. This leads to improved fitness, flexibility, and endurance, all of which result in better overall health. Exercise has additional benefits for people with diabetes, including:  Reducing appetite.  Helping to lower and control blood glucose.  Lowering blood pressure.  Helping to control amounts of fatty substances (lipids) in the blood, such as cholesterol and triglycerides.  Helping the body to respond better to insulin (improving insulin sensitivity).  Reducing how much insulin the body needs.  Decreasing the risk for heart disease by: ? Lowering cholesterol and triglyceride levels. ? Increasing the levels of good cholesterol. ? Lowering blood glucose levels. What is my activity plan? Your health care provider or certified diabetes educator can help you make a plan for the type and frequency of exercise (activity plan) that works for you. Make sure that you:  Do at least 150 minutes of moderate-intensity or vigorous-intensity exercise each week. This could be brisk walking, biking, or water aerobics. ? Do stretching and strength exercises, such as yoga or weightlifting, at least 2 times a week. ? Spread out your activity over at  least 3 days of the week.  Get some form of physical activity every day. ? Do not go more than 2 days in a row without some kind of physical activity. ? Avoid being inactive for more than 30 minutes at a time. Take frequent breaks to walk or stretch.  Choose a type of exercise or activity that you enjoy, and set realistic goals.  Start slowly, and gradually increase the intensity of your exercise over time. What do I need to know about managing my diabetes?   Check your blood glucose before and after exercising. ? If your blood glucose is 240 mg/dL (13.3 mmol/L) or higher before you exercise, check your urine for ketones. If you have ketones in your urine, do not exercise until your blood glucose returns to normal. ? If your blood glucose is 100 mg/dL (5.6 mmol/L) or lower, eat a snack containing 15-20 grams of carbohydrate. Check your blood glucose 15 minutes after the snack to make sure that your level is above 100 mg/dL (5.6 mmol/L) before you start your exercise.  Know the symptoms of low blood glucose (hypoglycemia) and how to treat it. Your risk for hypoglycemia increases during and after exercise. Common symptoms of hypoglycemia can include: ? Hunger. ? Anxiety. ? Sweating and feeling clammy. ? Confusion. ? Dizziness or feeling light-headed. ? Increased heart rate or palpitations. ? Blurry vision. ? Tingling or numbness around the mouth, lips, or tongue. ? Tremors or shakes. ? Irritability.  Keep a rapid-acting carbohydrate snack available before, during, and after exercise to help prevent or treat hypoglycemia.  Avoid injecting insulin into areas of the body that are going to be exercised. For example, avoid injecting insulin into: ? The arms, when playing tennis. ? The legs, when jogging.  Keep records of your exercise habits. Doing this can help you and your health care provider adjust your diabetes management plan as needed. Write down: ? Food that you eat before and  after you exercise. ? Blood glucose levels before and after you exercise. ? The type and amount of exercise you have done. ? When your insulin is expected to peak, if you use insulin. Avoid exercising at times when your insulin is peaking.  When you start a new exercise or activity, work with your health care provider to make sure the activity is safe for you, and to adjust your insulin, medicines, or food intake as needed.  Drink plenty of water while you exercise to prevent dehydration or heat stroke. Drink enough fluid to keep your urine clear or pale yellow. Summary  Exercising regularly is important for your overall health, especially when you have diabetes (diabetes mellitus).  Exercising has many health benefits, such as increasing muscle strength and bone density and reducing  body fat and stress.  Your health care provider or certified diabetes educator can help you make a plan for the type and frequency of exercise (activity plan) that works for you.  When you start a new exercise or activity, work with your health care provider to make sure the activity is safe for you, and to adjust your insulin, medicines, or food intake as needed. This information is not intended to replace advice given to you by your health care provider. Make sure you discuss any questions you have with your health care provider. Document Released: 10/04/2003 Document Revised: 02/05/2017 Document Reviewed: 12/24/2015 Elsevier Patient Education  [place].   Correction Insulin  Correction insulin, also called corrective insulin or a supplemental dose, is a small amount of insulin that can be used to lower your blood sugar (glucose) if it is too high. You may be instructed to check your blood glucose at certain times of the day and to use correction insulin as needed to lower your blood glucose to your target range. Correction insulin is primarily used as part of diabetes management. It may also be  prescribed for people who do not have diabetes. What is a correction scale? A correction scale, also called a sliding scale, is prescribed by your health care provider to help you determine when you need correction insulin. Your correction scale is based on your individual treatment goals, and it has two parts:  Ranges of blood glucose levels.  How much correction insulin to give yourself if your blood sugar falls within a certain range. If your blood glucose is in your desired range, you will not need correction insulin and you should take your normal insulin dose. What type of insulin do I need? Your health care provider may prescribe rapid-acting or short-acting insulin for you to use as correction insulin. Rapid-acting insulin:  Starts working quickly, in as little as 5 minutes.  Can last for 3-6 hours.  Works well when taken right before a meal to quickly lower blood glucose. Short-acting insulin:  Starts working in about 30 minutes.  Can last for 6-8 hours.  Should be taken about 30 minutes before you start eating a meal. Talk with your health care provider or pharmacist about which type of correction insulin to take and when to take it. If you use insulin to control your diabetes, you should use correction insulin in addition to the longer-acting (basal) insulin that you normally use. How do I manage my blood glucose with correction insulin? Giving a correction dose  Check your blood glucose as directed by your health care provider.  Use your correction scale to find the range that your blood glucose is in.  Identify the units of insulin that match your blood glucose range.  Make sure you have food available that you can eat in the next 15-30 minutes, after your correction dose.  Give yourself the dose of correction insulin that your health care provider has prescribed in your correction scale. Always make sure you are using the right type of insulin. ? If your correction  insulin is rapid-acting, start eating a meal within 15 minutes after your correction dose to keep your blood glucose from getting too low. ? If your correction insulin is short-acting, start eating a meal within 30 minutes after your correction dose to keep your blood glucose from getting too low. Keeping a blood glucose log   Write down your blood glucose test results and the amount of insulin that you  give yourself. Do this every time you check blood glucose or take insulin. Bring this log with you to your medical visits. This information will help your health care provider to manage your medicines.  Note anything that may affect your blood glucose, such as: ? Changes in normal exercise or activity. ? Changes in your normal schedule, such as changes in your sleep routine, going on vacation, changing your diet, or holidays. ? New over-the-counter or prescription medicines. ? Illness, stress, or anxiety. ? Changes in the time that you took your medicine or insulin. ? Changes in your meals, such as skipping a meal, having a late meal, or dining out. ? Eating things that may affect blood glucose, such as snacks, meal portions that are larger than normal, drinks that contain sugar, or eating less than usual. What do I need to know about hyperglycemia and hypoglycemia? What is hyperglycemia? Hyperglycemia, also called high blood glucose, occurs when blood glucose is too high. Make sure you know the early signs of hyperglycemia, such as:  Increased thirst.  Hunger.  Feeling very tired.  Needing to urinate more often than usual.  Blurry vision. What is hypoglycemia? Hypoglycemia is also called low blood glucose. Be aware of stacking your insulin doses. This happens when you correct a high blood glucose by giving yourself extra insulin too soon after a previous correction dose or mealtime dose. This may cause you to have too much insulin in your body and may put you at risk for hypoglycemia.  Hypoglycemia occurs with a blood glucose level at or below 70 mg/dL (3.9 mmol/L). It is important to know the symptoms of hypoglycemia and treat it right away. Always have a 15-gram rapid-acting carbohydrate snack with you to treat low blood glucose. Family members and close friends should also know the symptoms and should understand how to treat hypoglycemia, in case you are not able to treat yourself. What are the symptoms of hypoglycemia? Hypoglycemia symptoms can include:  Hunger.  Anxiety.  Sweating and feeling clammy.  Confusion.  Dizziness or light-headedness.  Sleepiness.  Nausea.  Increased heart rate.  Headache.  Blurry vision.  Jerky movements that you cannot control (seizure).  Nightmares.  Tingling or numbness around the mouth, lips, or tongue.  A change in speech.  Decreased ability to concentrate.  A change in coordination.  Restless sleep.  Tremors or shakes.  Fainting.  Irritability. How do I treat hypoglycemia? If you are alert and able to swallow safely, follow the 15:15 rule:  Take 15 grams of a rapid-acting carbohydrate. Rapid-acting options include: ? 1 tube of glucose gel. ? 3 glucose pills. ? 6-8 pieces of hard candy. ? 4 oz (120 mL) of fruit juice. ? 4 oz (120 mL) of regular (not diet) soda.  Check your blood glucose 15 minutes after you take the carbohydrate. ? If the repeat blood glucose level is still at or below 70 mg/dL (3.9 mmol/L), take 15 grams of a carbohydrate again. ? If your blood glucose level does not increase above 70 mg/dL (3.9 mmol/L) after 3 tries, seek emergency medical care.  After your blood glucose level returns to normal, eat a meal or a snack within 1 hour.  How do I treat severe hypoglycemia? Severe hypoglycemia is when your blood glucose level is at or below 54 mg/dL (3 mmol/L). Severe hypoglycemia is an emergency. Do not wait to see if the symptoms will go away. Get medical help right away. Call your  local emergency services (911 in  the [place] Do not drive yourself to the hospital. If you have severe hypoglycemia and you cannot eat or drink, you may need an injection of glucagon. A family member or close friend should learn how to check your blood glucose and how to give you a glucagon injection. Ask your health care provider if you need to have an emergency glucagon injection kit available. Severe hypoglycemia may need to be treated in a hospital. The treatment may include getting glucose through an IV tube. You may also need treatment for the cause of your hypoglycemia. Why do I need correction insulin if I do not have diabetes? If you do not have diabetes, your health care provider may prescribe insulin because:  Keeping your blood glucose in the target range is important for your overall health.  You are taking medicines that cause your blood glucose to be higher than normal. Contact a health care provider if:  You develop low blood glucose that you are not able to treat yourself.  You have high blood glucose that you are not able to correct with correction insulin.  Your blood glucose is often too low.  You used emergency glucagon to treat low blood glucose. Get help right away if:  You become unresponsive. If this happens, someone else should call emergency services (911 in the [place] right away.  Your blood glucose is lower than 54 mg/dL (3.0 mmol/L).  You become confused or you have trouble thinking clearly.  You have difficulty breathing. Summary  Correction insulin is a small amount of insulin that can be used to lower your blood sugar (glucose) if it is too high.  Talk with your health care provider or pharmacist about which type of correction insulin to take and when to take it. If you use insulin to control your diabetes, you should use correction insulin in addition to the longer-acting (basal) insulin that you normally use.  You may be instructed to check your blood  glucose at certain times of the day and to use correction insulin as needed to lower your blood glucose to your target range. Always keep a log of your blood glucose values and the amount of insulin that you used.  It is important to know the symptoms of hypoglycemia and treat it right away. Always have a 15-gram rapid-acting carbohydrate snack with you to treat low blood glucose. Family members and close friends should also know the symptoms and should understand how to treat hypoglycemia, in case you are not able to treat yourself. This information is not intended to replace advice given to you by your health care provider. Make sure you discuss any questions you have with your health care provider. Document Released: 12/05/2010 Document Revised: 07/24/2016 Document Reviewed: 04/11/2016 Elsevier Patient Education  2020 [place].   Diabetes Mellitus and Sick Day Management Blood sugar (glucose) can be difficult to control when you are sick. Common illnesses that can cause problems for people with diabetes (diabetes mellitus) include colds, fever, flu (influenza), nausea, vomiting, and diarrhea. These illnesses can cause stress and loss of body fluids (dehydration), and those issues can cause blood glucose levels to increase. Because of this, it is very important to take your insulin and diabetes medicines and eat some form of carbohydrate when you are sick. You should make a plan for days when you are sick (sick day plan) as part of your diabetes management plan. You and your health care provider should make this plan in advance. The following guidelines are  intended to help you manage an illness that lasts for about 24 hours or less. Your health care provider may also give you more specific instructions. What do I need to do to manage my blood glucose?   Check your blood glucose every 2-4 hours, or as often as told by your health care provider.  Know your sick day treatment goals. Your target  blood glucose levels may be different when you are sick.  If you use insulin, take your usual dose. ? If your blood glucose continues to be too high, you may need to take an additional insulin dose as told by your health care provider.  If you use oral diabetes medicine, you may need to stop taking it if you are not able to eat or drink normally. Ask your health care provider about whether you need to stop taking these medicines while you are sick.  If you use injectable hormone medicines other than insulin to control your diabetes, ask your health care provider about whether you need to stop taking these medicines while you are sick. What else can I do to manage my diabetes when I am sick? Check your ketones  If you have type 1 diabetes, check your urine ketones every 4 hours.  If you have type 2 diabetes, check your urine ketones as often as told by your health care provider. Drink fluids  Drink enough fluid to keep your urine clear or pale yellow. This is especially important if you have a fever, vomiting, or diarrhea. Those symptoms can lead to dehydration.  Follow any instructions from your health care provider about beverages to avoid. ? Do not drink alcohol, caffeine, or drinks that contain a lot of sugar. Take medicines as directed  Take-over-the-counter and prescription medicines only as told by your health care provider.  Check medicine labels for added sugars. Some medicines may contain sugar or types of sugars that can raise your blood glucose level. What foods can I eat when I am sick?  You need to eat some form of carbohydrates when you are sick. You should eat 45-50 grams (45-50 g) of carbohydrates every 3-4 hours until you feel better. All of the food choices below contain about 15 g of carbohydrates. Plan ahead and keep some of these foods around so you have them if you get sick.  4-6 oz (120-177 mL) carbonated beverage that contains sugar, such as regular (not diet)  soda. You may be able to drink carbonated beverages more easily if you open the beverage and let it sit at room temperature for a few minutes before drinking.   of a twin frozen ice pop.  4 oz (120 g) regular gelatin.  4 oz (120 mL) fruit juice.  4 oz (120 g) ice cream or frozen yogurt.  2 oz (60 g) sherbet.  8 oz (240 mL) clear broth or soup.  4 oz (120 g) regular custard.  4 oz (120 g) regular pudding.  8 oz (240 g) plain yogurt.  1 slice bread or toast.  6 saltine crackers.  5 vanilla wafers. Questions to ask your health care provider Consider asking the following questions so you know what to do on days when you are sick:  Should I adjust my diabetes medicines?  How often do I need to check my blood glucose?  What supplies do I need to manage my diabetes at home when I am sick?  What number can I call if I have questions?  What foods and  drinks should I avoid? Contact a health care provider if:  You develop symptoms of diabetic ketoacidosis, such as: ? Fatigue. ? Weight loss. ? Excessive thirst. ? Light-headedness. ? Fruity or sweet-smelling breath. ? Excessive urination. ? Vision changes. ? Confusion or irritability. ? Nausea. ? Vomiting. ? Rapid breathing. ? Pain in the abdomen. ? Feeling flushed.  You are unable to drink fluids without vomiting.  You have any of the following for more than 6 hours: ? Nausea. ? Vomiting. ? Diarrhea.  Your blood glucose is at or above 240 mg/dL (13.3 mmol/L), even after you take an additional insulin dose.  You have a change in how you think, feel, or act (mental status).  You develop another serious illness.  You have been sick or have had a fever for 2 days or longer and you are not getting better. Get help right away if:  Your blood glucose is lower than 54 mg/dL (3.0 mmol/L).  You have difficulty breathing.  You have moderate or high ketone levels in your urine.  You used emergency glucagon to  treat low blood glucose. Summary  Blood sugar (glucose) can be difficult to control when you are sick. Common illnesses that can cause problems for people with diabetes (diabetes mellitus) include colds, fever, flu (influenza), nausea, vomiting, and diarrhea.  Illnesses can cause stress and loss of body fluids (dehydration), and those issues can cause blood glucose levels to increase.  Make a plan for days when you are sick (sick day plan) as part of your diabetes management plan. You and your health care provider should make this plan in advance.  It is very important to take your insulin and diabetes medicines and to eat some form of carbohydrate when you are sick.  Contact your health care provider if have problems managing your blood glucose levels when you are sick, or if you have been sick or had a fever for 2 days or longer and are not getting better. This information is not intended to replace advice given to you by your health care provider. Make sure you discuss any questions you have with your health care provider. Document Released: 07/17/2003 Document Revised: 04/11/2016 Document Reviewed: 04/11/2016 Elsevier Patient Education  [place].   Diabetes Mellitus and [place] care is an important part of your health, especially when you have diabetes. Diabetes may cause you to have problems because of poor blood flow (circulation) to your feet and legs, which can cause your skin to:  Become thinner and drier.  Break more easily.  Heal more slowly.  Peel and crack. You may also have nerve damage (neuropathy) in your legs and feet, causing decreased feeling in them. This means that you may not notice minor injuries to your feet that could lead to more serious problems. Noticing and addressing any potential problems early is the best way to prevent future foot problems. How to care for your feet Foot hygiene  Wash your feet daily with warm water and mild soap. Do  not use hot water. Then, pat your feet and the areas between your toes until they are completely dry. Do not soak your feet as this can dry your skin.  Trim your toenails straight across. Do not dig under them or around the cuticle. File the edges of your nails with an emery board or nail file.  Apply a moisturizing lotion or petroleum jelly to the skin on your feet and to dry, brittle toenails. Use lotion that does  not contain alcohol and is unscented. Do not apply lotion between your toes. Shoes and socks  Wear clean socks or stockings every day. Make sure they are not too tight. Do not wear knee-high stockings since they may decrease blood flow to your legs.  Wear shoes that fit properly and have enough cushioning. Always look in your shoes before you put them on to be sure there are no objects inside.  To break in new shoes, wear them for just a few hours a day. This prevents injuries on your feet. Wounds, scrapes, corns, and calluses  Check your feet daily for blisters, cuts, bruises, sores, and redness. If you cannot see the bottom of your feet, use a mirror or ask someone for help.  Do not cut corns or calluses or try to remove them with medicine.  If you find a minor scrape, cut, or break in the skin on your feet, keep it and the skin around it clean and dry. You may clean these areas with mild soap and water. Do not clean the area with peroxide, alcohol, or iodine.  If you have a wound, scrape, corn, or callus on your foot, look at it several times a day to make sure it is healing and not infected. Check for: ? Redness, swelling, or pain. ? Fluid or blood. ? Warmth. ? Pus or a bad smell. General instructions  Do not cross your legs. This may decrease blood flow to your feet.  Do not use heating pads or hot water bottles on your feet. They may burn your skin. If you have lost feeling in your feet or legs, you may not know this is happening until it is too late.  Protect your  feet from hot and cold by wearing shoes, such as at the beach or on hot pavement.  Schedule a complete foot exam at least once a year (annually) or more often if you have foot problems. If you have foot problems, report any cuts, sores, or bruises to your health care provider immediately. Contact a health care provider if:  You have a medical condition that increases your risk of infection and you have any cuts, sores, or bruises on your feet.  You have an injury that is not healing.  You have redness on your legs or feet.  You feel burning or tingling in your legs or feet.  You have pain or cramps in your legs and feet.  Your legs or feet are numb.  Your feet always feel cold.  You have pain around a toenail. Get help right away if:  You have a wound, scrape, corn, or callus on your foot and: ? You have pain, swelling, or redness that gets worse. ? You have fluid or blood coming from the wound, scrape, corn, or callus. ? Your wound, scrape, corn, or callus feels warm to the touch. ? You have pus or a bad smell coming from the wound, scrape, corn, or callus. ? You have a fever. ? You have a red line going up your leg. Summary  Check your feet every day for cuts, sores, red spots, swelling, and blisters.  Moisturize feet and legs daily.  Wear shoes that fit properly and have enough cushioning.  If you have foot problems, report any cuts, sores, or bruises to your health care provider immediately.  Schedule a complete foot exam at least once a year (annually) or more often if you have foot problems. This information is not intended to replace  advice given to you by your health care provider. Make sure you discuss any questions you have with your health care provider. Document Released: 07/11/2000 Document Revised: 08/26/2017 Document Reviewed: 08/15/2016 Elsevier Patient Education  2020 [employer].

## 2019-03-24 NOTE — Discharge Summary (Signed)
**Note Stanley-Identified via Obfuscation** Physician Discharge Summary  Stanley Edwards:[id number] DOB: Dec 18, 1965 DOA: 03/21/2019  PCP: Shawnee Knapp, MD  Admit date: 03/21/2019 Discharge date: 03/24/2019  Admitted From: Home Disposition:  Home  Discharge Condition:Stable CODE STATUS:FULL Diet recommendation:  Carb Modified    Brief/Interim Summary:  Patient is a 28 old male with history of insulin-dependent diabetes mellitus who presented with complaints of polyuria, nausea, vomiting.  He said he ran out of insulin 2 months ago after he lost his job due to COVID-19 pandemic.  He was found to be in DKA on presentation.  He was started on insulin drip.  Currently gap is closed.  He has been started on long-acting insulin.  Diabetic coordinator was following.  He is medically stable for discharge to home today.  Following problems were addressed during his  Hospitalization:  Diabetic ketoacidosis: Was not taking insulin since last 2 months.  Presented with polyuria, nausea, vomiting.  Was started on insulin drip.  Currently gap is closed.  Started on long-acting insulin.  Uncontrolled diabetes: Hemoglobin A1c of 10.8.  Diabetic currently following.  He was not able to afford insulin because he lost his job.  He was on Lantus previously.  Changed  his insulin regimen to 70/30 insulin.  He will follow-up with [facility].  Hypokalemia/hypomagnesemia: Supplemented.  Acute kidney injury: Presented with creatinine of more than 3. Kidney function normalized.  Elevated bilirubin: Mild.  Continue to monitor as an outpatient    Discharge Diagnoses:  Principal Problem:   DKA (diabetic ketoacidoses) ([place]) Active Problems:   AKI (acute kidney injury) ([place])   Hyperkalemia   Hyperbilirubinemia    Discharge Instructions  Discharge Instructions    Diet Carb Modified   Complete by: As directed    Discharge instructions   Complete by: As directed    1)Take prescribed insulin as instructed. 2)Monitor your blood  sugars at home. 3)Follow up at [facility] on 9.22.20 at 9:30 am   Increase activity slowly   Complete by: As directed      Allergies as of 03/24/2019   No Known Allergies     Medication List    STOP taking these medications   FreeStyle Libre 14 Day Reader [facility] 14 Day Sensor Misc   Insulin Glargine 100 UNIT/ML Solostar Pen Commonly known as: Lantus SoloStar   True Metrix Meter Devi   TRUEplus Lancets 28G Misc     TAKE these medications   blood glucose meter kit and supplies Kit Dispense based on patient and insurance preference. Use up to four times daily as directed. (FOR ICD-9 250.00, 250.01). What changed:   medication strength  additional instructions   clotrimazole-betamethasone cream Commonly known as: LOTRISONE Apply 1 application topically 2 (two) times daily. X 2 weeks, then switch to clotrimazole twice daily until completely gone.   glucose blood test strip Commonly known as: True Metrix Blood Glucose Test Use 3 times daily before meals   Insulin Pen Needle 32G X 4 MM Misc 1 each by Does not apply route 2 (two) times daily. What changed:   medication strength  when to take this   metFORMIN 500 MG tablet Commonly known as: GLUCOPHAGE Take 2 tablets (1,000 mg total) by mouth 2 (two) times daily with a meal. Needs office visit for more refills   NovoLIN 70/30 FlexPen (70-30) 100 UNIT/ML PEN Generic drug: Insulin Isophane & Regular Human Inject 22 Units into the skin 2 (two) times  daily.   nystatin powder Commonly known as: MYCOSTATIN/NYSTOP Apply topically 4 (four) times daily.       No Known Allergies  Consultations:  None   Procedures/Studies:  No results found.    Subjective: Patient seen and examined the bedside this morning.  Hemodynamically stable for discharge.  Discharge Exam: Vitals:   03/24/19 0406 03/24/19 0735  BP: (!) 115/49 107/77  Pulse: 71 89  Resp:  18   Temp: 98.2 F (36.8 C) 98.4 F (36.9 C)  SpO2: 99% 100%   Vitals:   03/23/19 2031 03/23/19 2340 03/24/19 0406 03/24/19 0735  BP: 107/60 134/81 (!) 115/49 107/77  Pulse: 75 87 71 89  Resp:    18  Temp: 98.4 F (36.9 C) 99 F (37.2 C) 98.2 F (36.8 C) 98.4 F (36.9 C)  TempSrc: Oral Oral Oral Oral  SpO2: 100% 100% 99% 100%  Weight:      Height:        General: Pt is alert, awake, not in acute distress Cardiovascular: RRR, S1/S2 +, no rubs, no gallops Respiratory: CTA bilaterally, no wheezing, no rhonchi Abdominal: Soft, NT, ND, bowel sounds + Extremities: no edema, no cyanosis    The results of significant diagnostics from this hospitalization (including imaging, microbiology, ancillary and laboratory) are listed below for reference.     Microbiology: Recent Results (from the past 240 hour(s))  SARS CORONAVIRUS 2 (TAT 6-12 HRS)     Status: None   Collection Time: 03/21/19  8:00 PM  Result Value Ref Range Status   SARS Coronavirus 2 NEGATIVE NEGATIVE Final    Comment: (NOTE) SARS-CoV-2 target nucleic acids are NOT DETECTED. The SARS-CoV-2 RNA is generally detectable in upper and lower respiratory specimens during the acute phase of infection. Negative results do not preclude SARS-CoV-2 infection, do not rule out co-infections with other pathogens, and should not be used as the sole basis for treatment or other patient management decisions. Negative results must be combined with clinical observations, patient history, and epidemiological information. The expected result is Negative. Fact Sheet for Patients: SugarRoll.be Fact Sheet for Healthcare Providers: https://www.woods-mathews.com/ This test is not yet approved or cleared by the [place] FDA and  has been authorized for detection and/or diagnosis of SARS-CoV-2 by FDA under an Emergency Use Authorization (EUA). This EUA will remain  in effect (meaning this test can  be used) for the duration of the COVID-19 declaration under Section 56 4(b)(1) of the Act, 21 U.S.C. section 360bbb-3(b)(1), unless the authorization is terminated or revoked sooner. Performed at [facility], [place] [address], [place], [place] [address]   MRSA PCR Screening     Status: None   Collection Time: 03/22/19  5:43 AM   Specimen: Nasal Mucosa; Nasopharyngeal  Result Value Ref Range Status   MRSA by PCR NEGATIVE NEGATIVE Final    Comment:        The GeneXpert MRSA Assay (FDA approved for NASAL specimens only), is one component of a comprehensive MRSA colonization surveillance program. It is not intended to diagnose MRSA infection nor to guide or monitor treatment for MRSA infections. Performed at [facility], [place] [address] [place], [place] [address]      Labs: BNP (last 3 results) No results for input(s): BNP in the last 8760 hours. Basic Metabolic Panel: Recent Labs  Lab 03/22/19 0653 03/22/19 1235 03/22/19 2001 03/23/19 0124 03/24/19 0236 03/24/19 0819  NA 148* 150* 151* 147* 143  --   K 4.2 4.2  3.6 4.0 3.0*  --   CL 117* 116* 116* 113* 108  --   CO2 14* 20* 23 20* 23  --   GLUCOSE 290* 185* 121* 219* 174*  --   BUN 48* 43* 38* 38* 22*  --   CREATININE 2.56* 1.91* 1.59* 1.63* 1.15  --   CALCIUM 9.6 9.9 9.6 9.1 8.6*  --   MG  --   --   --   --   --  1.5*   Liver Function Tests: Recent Labs  Lab 03/21/19 1708 03/22/19 0400 03/23/19 0124  AST 12* 12* 23  ALT '16 14 12  '$ ALKPHOS 140* 138* 110  BILITOT 3.1* 2.8* 3.0*  PROT 8.3* 8.7* 6.8  ALBUMIN 4.1 4.4 3.4*   Recent Labs  Lab 03/21/19 1708  LIPASE 38   No results for input(s): AMMONIA in the last 168 hours. CBC: Recent Labs  Lab 03/21/19 1708 03/21/19 1807 03/22/19 0400 03/23/19 0106 03/24/19 0236  WBC 15.4*  --  17.7* 15.3* 9.2  NEUTROABS 14.1*  --  15.5*  --  6.6  HGB 14.9 15.3 15.5 12.1* 10.1*  HCT 47.3 45.0 47.3 36.2* 29.7*  MCV 110.8*  --  107.3* 104.0* 103.1*   PLT 397  --  378 309 244   Cardiac Enzymes: No results for input(s): CKTOTAL, CKMB, CKMBINDEX, TROPONINI in the last 168 hours. BNP: Invalid input(s): POCBNP CBG: Recent Labs  Lab 03/23/19 1401 03/23/19 1639 03/23/19 2142 03/23/19 2220 03/24/19 0739  GLUCAP 249* 174* 207* 201* 180*   D-Dimer No results for input(s): DDIMER in the last 72 hours. Hgb A1c Recent Labs    03/22/19 0410  HGBA1C 10.8*   Lipid Profile No results for input(s): CHOL, HDL, LDLCALC, TRIG, CHOLHDL, LDLDIRECT in the last 72 hours. Thyroid function studies No results for input(s): TSH, T4TOTAL, T3FREE, THYROIDAB in the last 72 hours.  Invalid input(s): FREET3 Anemia work up No results for input(s): VITAMINB12, FOLATE, FERRITIN, TIBC, IRON, RETICCTPCT in the last 72 hours. Urinalysis    Component Value Date/Time   COLORURINE YELLOW 03/22/2019 [place] 03/22/2019 0243   LABSPEC 1.025 03/22/2019 0243   PHURINE 5.0 03/22/2019 0243   GLUCOSEU >=500 (A) 03/22/2019 0243   HGBUR SMALL (A) 03/22/2019 0243   BILIRUBINUR NEGATIVE 03/22/2019 0243   BILIRUBINUR negative 01/23/2018 1016   KETONESUR 80 (A) 03/22/2019 0243   PROTEINUR NEGATIVE 03/22/2019 0243   UROBILINOGEN 1.0 01/23/2018 1016   NITRITE NEGATIVE 03/22/2019 0243   LEUKOCYTESUR NEGATIVE 03/22/2019 0243   Sepsis Labs Invalid input(s): PROCALCITONIN,  WBC,  LACTICIDVEN Microbiology Recent Results (from the past 240 hour(s))  SARS CORONAVIRUS 2 (TAT 6-12 HRS)     Status: None   Collection Time: 03/21/19  8:00 PM  Result Value Ref Range Status   SARS Coronavirus 2 NEGATIVE NEGATIVE Final    Comment: (NOTE) SARS-CoV-2 target nucleic acids are NOT DETECTED. The SARS-CoV-2 RNA is generally detectable in upper and lower respiratory specimens during the acute phase of infection. Negative results do not preclude SARS-CoV-2 infection, do not rule out co-infections with other pathogens, and should not be used as the sole basis  for treatment or other patient management decisions. Negative results must be combined with clinical observations, patient history, and epidemiological information. The expected result is Negative. Fact Sheet for Patients: SugarRoll.be Fact Sheet for Healthcare Providers: https://www.woods-mathews.com/ This test is not yet approved or cleared by the [place] FDA and  has been authorized for detection and/or diagnosis  of SARS-CoV-2 by FDA under an Emergency Use Authorization (EUA). This EUA will remain  in effect (meaning this test can be used) for the duration of the COVID-19 declaration under Section 56 4(b)(1) of the Act, 21 U.S.C. section 360bbb-3(b)(1), unless the authorization is terminated or revoked sooner. Performed at [facility], [place] [address] [place], [place] [address]   MRSA PCR Screening     Status: None   Collection Time: 03/22/19  5:43 AM   Specimen: Nasal Mucosa; Nasopharyngeal  Result Value Ref Range Status   MRSA by PCR NEGATIVE NEGATIVE Final    Comment:        The GeneXpert MRSA Assay (FDA approved for NASAL specimens only), is one component of a comprehensive MRSA colonization surveillance program. It is not intended to diagnose MRSA infection nor to guide or monitor treatment for MRSA infections. Performed at [facility], [place] [address], [place], [place] [address]     Please note: You were cared for by a hospitalist during your hospital stay. Once you are discharged, your primary care physician will handle any further medical issues. Please note that NO REFILLS for any discharge medications will be authorized once you are discharged, as it is imperative that you return to your primary care physician (or establish a relationship with a primary care physician if you do not have one) for your post hospital discharge needs so that they can reassess your need for medications and monitor your lab  values.    Time coordinating discharge: 40 minutes  SIGNED:   Shelly Coss, MD  [facility] 03/24/2019, 11:14 AM Pager [phone]  If 7PM-7AM, please contact night-coverage [email or web address] Password [email or web address]

## 2019-03-24 NOTE — Progress Notes (Signed)
Patient discharge instruction given and meds from [place] Patient had a infiltrate to his IV site. IV removed and ice given. Patient verbalized  understanding of discharge instruction using teach back.

## 2019-03-24 NOTE — TOC Transition Note (Signed)
Transition of Care [facility]) - CM/SW Discharge Note   Patient Details  Name: KASON BENAK MRN: [id number] Date of Birth: 26-Jun-1966  [facility] CM/SW Contact:  Sharin Mons, RN Phone Number: 03/24/2019, 2:01 PM   Clinical Narrative:    Pt will transition to home today. Lives alone. Supportive sister. 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 Match Letter provided to pt to assist with Rx meds, pt with limited income. Pt to f/u with  [facility] for hospital f/u and establish primary care, noted on AVS.  Evern Core (Sister)  Pt's cell #   [phone]  [phone]    Sister to provide transportation to home.  Final next level of care: Home/Self Care Barriers to Discharge: No Barriers Identified   Patient Goals and CMS Choice        Discharge Placement                       Discharge Plan and Services                                     Social Determinants of Health (SDOH) Interventions     Readmission Risk Interventions No flowsheet data found.

## 2019-04-19 ENCOUNTER — Encounter: Payer: Self-pay | Admitting: Family Medicine

## 2019-04-19 ENCOUNTER — Other Ambulatory Visit: Payer: Self-pay

## 2019-04-19 ENCOUNTER — Ambulatory Visit: Payer: Self-pay | Attending: Family Medicine | Admitting: Family Medicine

## 2019-04-19 VITALS — BP 128/78 | HR 78 | Temp 98.5°F | Ht 74.0 in | Wt 208.0 lb

## 2019-04-19 DIAGNOSIS — E876 Hypokalemia: Secondary | ICD-10-CM

## 2019-04-19 DIAGNOSIS — D649 Anemia, unspecified: Secondary | ICD-10-CM

## 2019-04-19 DIAGNOSIS — E1165 Type 2 diabetes mellitus with hyperglycemia: Secondary | ICD-10-CM

## 2019-04-19 DIAGNOSIS — Z794 Long term (current) use of insulin: Secondary | ICD-10-CM

## 2019-04-19 DIAGNOSIS — Z1211 Encounter for screening for malignant neoplasm of colon: Secondary | ICD-10-CM

## 2019-04-19 LAB — GLUCOSE, POCT (MANUAL RESULT ENTRY): POC Glucose: 183 mg/dl — AB (ref 70–99)

## 2019-04-19 MED ORDER — TRUE METRIX BLOOD GLUCOSE TEST VI STRP: ORAL_STRIP | 12 refills | Status: DC

## 2019-04-19 MED ORDER — NOVOLIN 70/30 FLEXPEN (70-30) 100 UNIT/ML ~~LOC~~ SUPN: 22.0000 [IU] | PEN_INJECTOR | Freq: Two times a day (BID) | SUBCUTANEOUS | 6 refills | Status: DC

## 2019-04-19 MED ORDER — TRUE METRIX METER DEVI: 1.0000 | Freq: Three times a day (TID) | 0 refills | Status: DC

## 2019-04-19 MED ORDER — TRUEPLUS LANCETS 28G MISC: 1.0000 | Freq: Three times a day (TID) | 12 refills | Status: DC

## 2019-04-19 MED ORDER — ATORVASTATIN CALCIUM 20 MG PO TABS: 20.0000 mg | ORAL_TABLET | Freq: Every day | ORAL | 6 refills | Status: DC

## 2019-04-19 MED FILL — ATORVASTATIN 20 MG TABLET: 20 | 30 days supply | Qty: 30 | Fill #0

## 2019-04-19 MED FILL — !TRUE METRIX BLOOD GLUCOSE: 365 days supply | Qty: 1 | Fill #0

## 2019-04-19 MED FILL — TRUEplus LANCETS 28G MISC: 30 days supply | Qty: 100 | Fill #0

## 2019-04-19 MED FILL — HUMULIN 70/30 KWIKPEN: (70-30) 100 | 13 days supply | Qty: 6 | Fill #0

## 2019-04-19 MED FILL — TRUE METRIX TEST STRIP: 30 days supply | Qty: 100 | Fill #0

## 2019-04-19 NOTE — Progress Notes (Signed)
Subjective:  Patient ID: Stanley Edwards, male    DOB: [birth year]  Age: 53 y.o. MRN: [id number]  CC: Hospitalization Follow-up   HPI Stanley Edwards is a 53 year old male with Type 2 Diabetes Mellitus (A1c 10.8), anemia previously followed by [facility] presents today to establish care after hospitalization at [facility] from 03/21/19 through 03/24/19 for DKA. He had run out of his medications after losing his insurance. Treated with IV fluids, insulin drip and restarted on Novolin 70/30 in addition to metformin. Hospitalization notes reviewed; of note he was discharged with a potassium of 3.0, hemoglobin was 10.1  He presents today and has no means of checking his blood sugars as he has no glucometer.  Endorses compliance with insulin but not with metformin as he informs me " my body rejects metformin" which he describes as headaches, nausea and vomiting with metformin. He denies visual concerns or numbness in extremities.  He is requesting a urine drug test as he is currently seeking for drug but would like to have those results.  He denies substance abuse. Denies chest pain, pedal edema or other additional concerns.  Past Medical History:  Diagnosis Date  . Allergy   . Diabetes mellitus without complication ([place])   . Hypertension    Pt denies htn but it was on his previous hx    History reviewed. No pertinent surgical history.  Family History  Problem Relation Age of Onset  . Cancer Mother   . Diabetes Father     No Known Allergies  Outpatient Medications Prior to Visit  Medication Sig Dispense Refill  . Insulin Pen Needle 32G X 4 MM MISC 1 each by Does not apply route 2 (two) times daily. 100 each 0  . blood glucose meter kit and supplies KIT Dispense based on patient and insurance preference. Use up to four times daily as directed. (FOR ICD-9 250.00, 250.01). 1 each 0  . glucose blood (TRUE METRIX BLOOD GLUCOSE TEST) test strip Use 3 times daily before meals 100  each 12  . Insulin Isophane & Regular Human (NOVOLIN 70/30 FLEXPEN) (70-30) 100 UNIT/ML PEN Inject 22 Units into the skin 2 (two) times daily. 15 mL 1  . clotrimazole-betamethasone (LOTRISONE) cream Apply 1 application topically 2 (two) times daily. X 2 weeks, then switch to clotrimazole twice daily until completely gone. (Patient not taking: Reported on 03/21/2019) 45 g 1  . nystatin (MYCOSTATIN/NYSTOP) powder Apply topically 4 (four) times daily. (Patient not taking: Reported on 03/21/2019) 60 g 1  . metFORMIN (GLUCOPHAGE) 500 MG tablet Take 2 tablets (1,000 mg total) by mouth 2 (two) times daily with a meal. Needs office visit for more refills (Patient not taking: Reported on 03/21/2019) 180 tablet 0   No facility-administered medications prior to visit.      ROS Review of Systems  Constitutional: Negative for activity change and appetite change.  HENT: Negative for sinus pressure and sore throat.   Eyes: Negative for visual disturbance.  Respiratory: Negative for cough, chest tightness and shortness of breath.   Cardiovascular: Negative for chest pain and leg swelling.  Gastrointestinal: Negative for abdominal distention, abdominal pain, constipation and diarrhea.  Endocrine: Negative.   Genitourinary: Negative for dysuria.  Musculoskeletal: Negative for joint swelling and myalgias.  Skin: Negative for rash.  Allergic/Immunologic: Negative.   Neurological: Negative for weakness, light-headedness and numbness.  Psychiatric/Behavioral: Negative for dysphoric mood and suicidal ideas.    Objective:  BP 128/78   Pulse 78  Temp 98.5 F (36.9 C) (Oral)   Ht '6\' 2"'$  (1.88 m)   Wt 208 lb (94.3 kg)   SpO2 100%   BMI 26.71 kg/m   BP/Weight 04/19/2019 03/24/2019 6/96/2952  Systolic BP 841 324 -  Diastolic BP 78 79 -  Wt. (Lbs) 208 - 174.16  BMI 26.71 - 22.36      Physical Exam Constitutional:      Appearance: He is well-developed.  Neck:     Vascular: No JVD.  Cardiovascular:      Rate and Rhythm: Normal rate.     Heart sounds: Normal heart sounds. No murmur.  Pulmonary:     Effort: Pulmonary effort is normal.     Breath sounds: Normal breath sounds. No wheezing or rales.  Chest:     Chest wall: No tenderness.  Abdominal:     General: Bowel sounds are normal. There is no distension.     Palpations: Abdomen is soft. There is no mass.     Tenderness: There is no abdominal tenderness.  Musculoskeletal: Normal range of motion.     Right lower leg: No edema.     Left lower leg: No edema.  Neurological:     Mental Status: He is alert and oriented to person, place, and time.  Psychiatric:        Mood and Affect: Mood normal.     CMP Latest Ref Rng & Units 03/24/2019 03/23/2019 03/22/2019  Glucose 70 - 99 mg/dL 174(H) 219(H) 121(H)  BUN 6 - 20 mg/dL 22(H) 38(H) 38(H)  Creatinine 0.61 - 1.24 mg/dL 1.15 1.63(H) 1.59(H)  Sodium 135 - 145 mmol/L 143 147(H) 151(H)  Potassium 3.5 - 5.1 mmol/L 3.0(L) 4.0 3.6  Chloride 98 - 111 mmol/L 108 113(H) 116(H)  CO2 22 - 32 mmol/L 23 20(L) 23  Calcium 8.9 - 10.3 mg/dL 8.6(L) 9.1 9.6  Total Protein 6.5 - 8.1 g/dL - 6.8 -  Total Bilirubin 0.3 - 1.2 mg/dL - 3.0(H) -  Alkaline Phos 38 - 126 U/L - 110 -  AST 15 - 41 U/L - 23 -  ALT 0 - 44 U/L - 12 -    Lipid Panel     Component Value Date/Time   CHOL 180 01/23/2018 1017   TRIG 94 01/23/2018 1017   HDL 43 01/23/2018 1017   CHOLHDL 4.2 01/23/2018 1017   CHOLHDL 4.7 09/21/2015 0940   VLDL 27 09/21/2015 0940   LDLCALC 118 (H) 01/23/2018 1017    CBC    Component Value Date/Time   WBC 9.2 03/24/2019 0236   RBC 2.88 (L) 03/24/2019 0236   HGB 10.1 (L) 03/24/2019 0236   HGB 12.4 (L) 01/23/2018 1017   HCT 29.7 (L) 03/24/2019 0236   HCT 36.7 (L) 01/23/2018 1017   PLT 244 03/24/2019 0236   PLT 291 01/23/2018 1017   MCV 103.1 (H) 03/24/2019 0236   MCV 100 (H) 01/23/2018 1017   MCH 35.1 (H) 03/24/2019 0236   MCHC 34.0 03/24/2019 0236   RDW 11.9 03/24/2019 0236   RDW 12.3  01/23/2018 1017   LYMPHSABS 2.0 03/24/2019 0236   LYMPHSABS 1.4 01/23/2018 1017   MONOABS 0.5 03/24/2019 0236   EOSABS 0.1 03/24/2019 0236   EOSABS 0.2 01/23/2018 1017   BASOSABS 0.0 03/24/2019 0236   BASOSABS 0.0 01/23/2018 1017    Lab Results  Component Value Date   HGBA1C 10.8 (H) 03/22/2019    Assessment & Plan:   1. Type 2 diabetes mellitus with hyperglycemia, with long-term current  use of insulin (HCC) Uncontrolled with A1c of 10.8  Unable to tolerate Metformin which I have discontinued Discussed available resources in the pharmacy in-house with regards to his medications Counseled on Diabetic diet, my plate method, 915 minutes of moderate intensity exercise/week Keep blood sugar logs with fasting goals of 80-120 mg/dl, random of less than 180 and in the event of sugars less than 60 mg/dl or greater than 400 mg/dl please notify the clinic ASAP. It is recommended that you undergo annual eye exams and annual foot exams. Pneumonia vaccine is recommended. - glucose blood (TRUE METRIX BLOOD GLUCOSE TEST) test strip; Use 3 times daily before meals  Dispense: 100 each; Refill: 12 - Insulin Isophane & Regular Human (NOVOLIN 70/30 FLEXPEN) (70-30) 100 UNIT/ML PEN; Inject 22 Units into the skin 2 (two) times daily.  Dispense: 15 mL; Refill: 6 - Blood Glucose Monitoring Suppl (TRUE METRIX METER) DEVI; 1 each by Does not apply route 3 (three) times daily before meals.  Dispense: 1 Device; Refill: 0 - TRUEplus Lancets 28G MISC; 1 each by Does not apply route 3 (three) times daily before meals.  Dispense: 100 each; Refill: 12 - atorvastatin (LIPITOR) 20 MG tablet; Take 1 tablet (20 mg total) by mouth daily.  Dispense: 30 tablet; Refill: 6 - Microalbumin/Creatinine Ratio, Urine - Drug Screen 12+Alcohol+CRT, Ur  2. Hypokalemia Last potassium was 3.0 - POCT glucose (manual entry) - Basic Metabolic Panel  3. Screening for colon cancer - Fecal occult blood, imunochemical([facility])   4. Anemia, unspecified type Hemoglobin of 10.1 Unable to afford colonoscopy to screen for colon cancer; will order stool FIT test Will prefer he have a colonoscopy - advised to apply for the [facility] discount to facilitate referral.  Health Care Maintenance: see #3 Meds ordered this encounter  Medications  . glucose blood (TRUE METRIX BLOOD GLUCOSE TEST) test strip    Sig: Use 3 times daily before meals    Dispense:  100 each    Refill:  12  . Insulin Isophane & Regular Human (NOVOLIN 70/30 FLEXPEN) (70-30) 100 UNIT/ML PEN    Sig: Inject 22 Units into the skin 2 (two) times daily.    Dispense:  15 mL    Refill:  6  . Blood Glucose Monitoring Suppl (TRUE METRIX METER) DEVI    Sig: 1 each by Does not apply route 3 (three) times daily before meals.    Dispense:  1 Device    Refill:  0  . TRUEplus Lancets 28G MISC    Sig: 1 each by Does not apply route 3 (three) times daily before meals.    Dispense:  100 each    Refill:  12  . atorvastatin (LIPITOR) 20 MG tablet    Sig: Take 1 tablet (20 mg total) by mouth daily.    Dispense:  30 tablet    Refill:  6    Follow-up: Return in about 3 months (around 07/19/2019) for medical conditions.       Charlott Rakes, MD, FAAFP. [facility] [place], [place]   04/19/2019, 10:23 AM

## 2019-04-19 NOTE — Patient Instructions (Signed)
Diabetes Mellitus and Exercise Exercising regularly is important for your overall health, especially when you have diabetes (diabetes mellitus). Exercising is not only about losing weight. It has many other health benefits, such as increasing muscle strength and bone density and reducing body fat and stress. This leads to improved fitness, flexibility, and endurance, all of which result in better overall health. Exercise has additional benefits for people with diabetes, including:  Reducing appetite.  Helping to lower and control blood glucose.  Lowering blood pressure.  Helping to control amounts of fatty substances (lipids) in the blood, such as cholesterol and triglycerides.  Helping the body to respond better to insulin (improving insulin sensitivity).  Reducing how much insulin the body needs.  Decreasing the risk for heart disease by: ? Lowering cholesterol and triglyceride levels. ? Increasing the levels of good cholesterol. ? Lowering blood glucose levels. What is my activity plan? Your health care provider or certified diabetes educator can help you make a plan for the type and frequency of exercise (activity plan) that works for you. Make sure that you:  Do at least 150 minutes of moderate-intensity or vigorous-intensity exercise each week. This could be brisk walking, biking, or water aerobics. ? Do stretching and strength exercises, such as yoga or weightlifting, at least 2 times a week. ? Spread out your activity over at least 3 days of the week.  Get some form of physical activity every day. ? Do not go more than 2 days in a row without some kind of physical activity. ? Avoid being inactive for more than 30 minutes at a time. Take frequent breaks to walk or stretch.  Choose a type of exercise or activity that you enjoy, and set realistic goals.  Start slowly, and gradually increase the intensity of your exercise over time. What do I need to know about managing my  diabetes?   Check your blood glucose before and after exercising. ? If your blood glucose is 240 mg/dL (13.3 mmol/L) or higher before you exercise, check your urine for ketones. If you have ketones in your urine, do not exercise until your blood glucose returns to normal. ? If your blood glucose is 100 mg/dL (5.6 mmol/L) or lower, eat a snack containing 15-20 grams of carbohydrate. Check your blood glucose 15 minutes after the snack to make sure that your level is above 100 mg/dL (5.6 mmol/L) before you start your exercise.  Know the symptoms of low blood glucose (hypoglycemia) and how to treat it. Your risk for hypoglycemia increases during and after exercise. Common symptoms of hypoglycemia can include: ? Hunger. ? Anxiety. ? Sweating and feeling clammy. ? Confusion. ? Dizziness or feeling light-headed. ? Increased heart rate or palpitations. ? Blurry vision. ? Tingling or numbness around the mouth, lips, or tongue. ? Tremors or shakes. ? Irritability.  Keep a rapid-acting carbohydrate snack available before, during, and after exercise to help prevent or treat hypoglycemia.  Avoid injecting insulin into areas of the body that are going to be exercised. For example, avoid injecting insulin into: ? The arms, when playing tennis. ? The legs, when jogging.  Keep records of your exercise habits. Doing this can help you and your health care provider adjust your diabetes management plan as needed. Write down: ? Food that you eat before and after you exercise. ? Blood glucose levels before and after you exercise. ? The type and amount of exercise you have done. ? When your insulin is expected to peak, if you use   insulin. Avoid exercising at times when your insulin is peaking.  When you start a new exercise or activity, work with your health care provider to make sure the activity is safe for you, and to adjust your insulin, medicines, or food intake as needed.  Drink plenty of water while  you exercise to prevent dehydration or heat stroke. Drink enough fluid to keep your urine clear or pale yellow. Summary  Exercising regularly is important for your overall health, especially when you have diabetes (diabetes mellitus).  Exercising has many health benefits, such as increasing muscle strength and bone density and reducing body fat and stress.  Your health care provider or certified diabetes educator can help you make a plan for the type and frequency of exercise (activity plan) that works for you.  When you start a new exercise or activity, work with your health care provider to make sure the activity is safe for you, and to adjust your insulin, medicines, or food intake as needed. This information is not intended to replace advice given to you by your health care provider. Make sure you discuss any questions you have with your health care provider. Document Released: 10/04/2003 Document Revised: 02/05/2017 Document Reviewed: 12/24/2015 Elsevier Patient Education  2020 Elsevier Inc.  

## 2019-04-20 LAB — MICROALBUMIN / CREATININE URINE RATIO
Creatinine, Urine: 230.4 mg/dL
Microalb/Creat Ratio: 6 mg/g creat (ref 0–29)
Microalbumin, Urine: 14.6 ug/mL

## 2019-04-20 LAB — BASIC METABOLIC PANEL
BUN/Creatinine Ratio: 12 (ref 9–20)
BUN: 14 mg/dL (ref 6–24)
CO2: 26 mmol/L (ref 20–29)
Calcium: 9.2 mg/dL (ref 8.7–10.2)
Chloride: 102 mmol/L (ref 96–106)
Creatinine, Ser: 1.16 mg/dL (ref 0.76–1.27)
GFR calc Af Amer: 82 mL/min/{1.73_m2} (ref >59)
GFR calc non Af Amer: 71 mL/min/{1.73_m2} (ref >59)
Glucose: 96 mg/dL (ref 65–99)
Potassium: 4.7 mmol/L (ref 3.5–5.2)
Sodium: 140 mmol/L (ref 134–144)

## 2019-04-21 ENCOUNTER — Telehealth: Payer: Self-pay | Admitting: Family Medicine

## 2019-04-21 LAB — DRUG SCREEN 12+ALCOHOL+CRT, UR
Amphetamines, Urine: NEGATIVE ng/mL
BENZODIAZ UR QL: NEGATIVE ng/mL
Barbiturate: NEGATIVE ng/mL
Cannabinoids: NEGATIVE ng/mL
Cocaine (Metabolite): NEGATIVE ng/mL
Creatinine, Urine: 226.1 mg/dL (ref 20.0–300.0)
Ethanol, Urine: NEGATIVE %
Meperidine: NEGATIVE ng/mL
Methadone: NEGATIVE ng/mL
OPIATE SCREEN URINE: NEGATIVE ng/mL
Oxycodone/Oxymorphone, Urine: NEGATIVE ng/mL
Phencyclidine: NEGATIVE ng/mL
Propoxyphene: NEGATIVE ng/mL
Tramadol: NEGATIVE ng/mL

## 2019-04-21 NOTE — Telephone Encounter (Signed)
Patient called to get their lab results please follow up °

## 2019-04-22 NOTE — Telephone Encounter (Signed)
-----   Message from Charlott Rakes, MD sent at 04/20/2019  6:33 PM EDT ----- Please inform the patient that labs are normal. Thank you.

## 2019-04-22 NOTE — Telephone Encounter (Signed)
Patient name and DOB has been verified Patient was informed of lab results. Patient had no questions.  

## 2019-05-06 MED FILL — HUMULIN 70/30 KWIKPEN: (70-30) 100 | 13 days supply | Qty: 6 | Fill #0

## 2019-05-06 MED FILL — ATORVASTATIN CALCIUM 20 MG: 20 | 30 days supply | Qty: 30 | Fill #0

## 2019-05-20 MED FILL — HUMULIN 70/30 KWIKPEN: (70-30) 100 | 13 days supply | Qty: 6 | Fill #1

## 2019-06-09 MED FILL — HUMULIN 70/30 KWIKPEN: (70-30) 100 | 13 days supply | Qty: 6 | Fill #2

## 2019-06-29 MED FILL — HUMULIN 70/30 KWIKPEN: (70-30) 100 | 13 days supply | Qty: 6 | Fill #3

## 2019-07-19 MED FILL — HUMULIN 70/30 KWIKPEN: (70-30) 100 | 34 days supply | Qty: 15 | Fill #0

## 2019-09-12 MED FILL — HUMULIN 70/30 KWIKPEN: (70-30) 100 | 32 days supply | Qty: 15 | Fill #4

## 2019-10-27 MED FILL — $HUMULIN 70/30 KWIKPEN: (70-30) 100 | 32 days supply | Qty: 15 | Fill #5

## 2020-02-01 MED FILL — $HUMULIN 70/30 KWIKPEN: (70-30) 100 | 68 days supply | Qty: 30 | Fill #7

## 2020-04-16 ENCOUNTER — Telehealth: Payer: Self-pay | Admitting: Family Medicine

## 2020-04-16 MED ORDER — SILDENAFIL CITRATE 50 MG PO TABS: 50.0000 mg | ORAL_TABLET | Freq: Every day | ORAL | 1 refills | Status: DC | PRN

## 2020-04-16 MED FILL — $HUMULIN 70/30 KWIKPEN: (70-30) 100 | 13 days supply | Qty: 6 | Fill #8

## 2020-04-16 MED FILL — SILDENAFIL CITRATE 50 MG TA: 50 | 30 days supply | Qty: 10 | Fill #0

## 2020-04-16 NOTE — Telephone Encounter (Signed)
Patient was called and informed of medication being sent to pharmacy. 

## 2020-04-16 NOTE — Telephone Encounter (Signed)
Copied from CRM [id number]. Topic: General - Other >> Apr 16, 2020 10:00 AM Gwenlyn Fudge wrote: Reason for CRM: Pt called stating that he has a medical condition that he is needing to speak with Dr. Alvis Lemmings regarding a medical condition. He would not provide further info. Please advise.

## 2020-04-16 NOTE — Telephone Encounter (Signed)
Rx for Viagra sent to Pharmacy.

## 2020-04-16 NOTE — Telephone Encounter (Signed)
Patient is requesting a medication for Ed states that his diabetes medication is affecting him.

## 2020-05-25 ENCOUNTER — Telehealth: Payer: Self-pay | Admitting: Family Medicine

## 2020-05-25 DIAGNOSIS — Z794 Long term (current) use of insulin: Secondary | ICD-10-CM

## 2020-05-25 DIAGNOSIS — E1165 Type 2 diabetes mellitus with hyperglycemia: Secondary | ICD-10-CM

## 2020-05-25 NOTE — Telephone Encounter (Signed)
Medication Refill - Medication: insulin & insulin needles (Patient stated that pharmacy advised patient to contact his pcp to send over a new prescription)  Has the patient contacted their pharmacy?yes (Agent: If no, request that the patient contact the pharmacy for the refill.) (Agent: If yes, when and what did the pharmacy advise?)Contact PCP  Preferred Pharmacy (with phone number or street name): [facility] [place] [place] [facility] [place] [address] Phone:  [phone]  Fax:  [phone]       Agent: Please be advised that RX refills may take up to 3 business days. We ask that you follow-up with your pharmacy.

## 2020-05-25 NOTE — Telephone Encounter (Signed)
Duplicate request Already sent to office for approval  

## 2020-05-25 NOTE — Telephone Encounter (Signed)
Requested medication (s) are due for refill today: yes   Requested medication (s) are on the active medication list: yes  Last refill:  04/16/2020  Future visit scheduled: no  Notes to clinic: overdue for labs and follow up appointment    Requested Prescriptions  Pending Prescriptions Disp Refills   HUMULIN 70/30 KWIKPEN (70-30) 100 UNIT/ML KwikPen [Pharmacy Med Name: HUMULIN 70/30 KWIKPEN (70-30) 100 Suspension Pen-injector] 6 mL 6    Sig: INJECT 22 UNITS INTO THE SKIN 2 (TWO) TIMES DAILY.      Endocrinology:  Diabetes - Insulins Failed - 05/25/2020  9:56 AM      Failed - HBA1C is between 0 and 7.9 and within 180 days    Hgb A1c MFr Bld  Date Value Ref Range Status  03/22/2019 10.8 (H) 4.8 - 5.6 % Final    Comment:    (NOTE) Pre diabetes:          5.7%-6.4% Diabetes:              >6.4% Glycemic control for   <7.0% adults with diabetes           Failed - Valid encounter within last 6 months    Recent Outpatient Visits           1 year ago Hypokalemia   [facility] [place], Odette Horns, MD   2 years ago Annual physical exam   [facility], Levell July, MD   3 years ago Anemia, unspecified type   [facility], Levell July, MD   4 years ago Type 2 diabetes mellitus with stage 2 chronic kidney disease, with long-term current use of insulin (HCC)   [place] [facility] [place], Odette Horns, MD   4 years ago Type 2 diabetes mellitus with hyperglycemia, with long-term current use of insulin [facility])   [place] Hoy Register, MD

## 2020-05-28 ENCOUNTER — Other Ambulatory Visit: Payer: Self-pay | Admitting: Family Medicine

## 2020-05-28 DIAGNOSIS — Z794 Long term (current) use of insulin: Secondary | ICD-10-CM

## 2020-05-28 DIAGNOSIS — E1165 Type 2 diabetes mellitus with hyperglycemia: Secondary | ICD-10-CM

## 2020-05-28 NOTE — Telephone Encounter (Signed)
Requested medication (s) are due for refill today:  Yes  Requested medication (s) are on the active medication list:  Yes  Future visit scheduled:  no  Last Refill: 05/25/20 by Franky Macho; the Rx does not show that it was electronically transmitted/ rec'd at pharmacy  Notes to office:  attempted to call pt.  Left vm to call office to schedule f/u appt. (last appt. 03/2019)    Requested Prescriptions  Pending Prescriptions Disp Refills   HUMULIN 70/30 KWIKPEN (70-30) 100 UNIT/ML KwikPen [Pharmacy Med Name: HUMULIN 70/30 KWIKPEN (70-30) 100 Suspension Pen-injector] 6 mL 6    Sig: INJECT 22 UNITS INTO THE SKIN 2 (TWO) TIMES DAILY.      Endocrinology:  Diabetes - Insulins Failed - 05/28/2020 10:05 AM      Failed - HBA1C is between 0 and 7.9 and within 180 days    Hgb A1c MFr Bld  Date Value Ref Range Status  03/22/2019 10.8 (H) 4.8 - 5.6 % Final    Comment:    (NOTE) Pre diabetes:          5.7%-6.4% Diabetes:              >6.4% Glycemic control for   <7.0% adults with diabetes           Failed - Valid encounter within last 6 months    Recent Outpatient Visits           1 year ago Hypokalemia   [place] [place], Odette Horns, MD   2 years ago Annual physical exam   [facility], Levell July, MD   3 years ago Anemia, unspecified type   [facility], Levell July, MD   4 years ago Type 2 diabetes mellitus with stage 2 chronic kidney disease, with long-term current use of insulin (HCC)   [place] [facility] [place], Odette Horns, MD   4 years ago Type 2 diabetes mellitus with hyperglycemia, with long-term current use of insulin [facility])   [place] [facility] Hoy Register, MD

## 2020-05-28 NOTE — Telephone Encounter (Signed)
Pt states he is completely out of his insulin  Medication since Friday. Please advise  [facility]

## 2020-05-29 ENCOUNTER — Other Ambulatory Visit: Payer: Self-pay | Admitting: Family Medicine

## 2020-05-29 MED ORDER — NOVOLIN 70/30 FLEXPEN (70-30) 100 UNIT/ML ~~LOC~~ SUPN: 22.0000 [IU] | PEN_INJECTOR | Freq: Two times a day (BID) | SUBCUTANEOUS | 0 refills | Status: DC

## 2020-05-29 MED FILL — $HUMULIN 70/30 KWIKPEN: (70-30) 100 | 34 days supply | Qty: 15 | Fill #0

## 2020-05-29 NOTE — Addendum Note (Signed)
Addended by: Lois Huxley, Jeannett Senior L on: 05/29/2020 12:21 PM   Modules accepted: Orders

## 2020-05-29 NOTE — Telephone Encounter (Signed)
Rx sent 

## 2020-05-29 NOTE — Telephone Encounter (Signed)
PT made appt for tomorrow. Pt wants reconsideration and insulin pens called in today as out.

## 2020-05-30 ENCOUNTER — Other Ambulatory Visit: Payer: Self-pay | Admitting: Family Medicine

## 2020-05-30 ENCOUNTER — Other Ambulatory Visit: Payer: Self-pay

## 2020-05-30 ENCOUNTER — Ambulatory Visit: Payer: Self-pay | Attending: Family Medicine | Admitting: Family Medicine

## 2020-05-30 ENCOUNTER — Encounter: Payer: Self-pay | Admitting: Family Medicine

## 2020-05-30 VITALS — BP 118/72 | HR 84 | Ht 74.0 in | Wt 209.8 lb

## 2020-05-30 DIAGNOSIS — E1165 Type 2 diabetes mellitus with hyperglycemia: Secondary | ICD-10-CM

## 2020-05-30 DIAGNOSIS — Z794 Long term (current) use of insulin: Secondary | ICD-10-CM

## 2020-05-30 DIAGNOSIS — Z1211 Encounter for screening for malignant neoplasm of colon: Secondary | ICD-10-CM

## 2020-05-30 LAB — POCT GLYCOSYLATED HEMOGLOBIN (HGB A1C): HbA1c, POC (controlled diabetic range): 7.1 % — AB (ref 0.0–7.0)

## 2020-05-30 LAB — GLUCOSE, POCT (MANUAL RESULT ENTRY): POC Glucose: 231 mg/dl — AB (ref 70–99)

## 2020-05-30 MED ORDER — TRUEPLUS LANCETS 28G MISC: 1.0000 | Freq: Three times a day (TID) | 12 refills | Status: DC

## 2020-05-30 MED ORDER — NOVOLIN 70/30 FLEXPEN (70-30) 100 UNIT/ML ~~LOC~~ SUPN: 22.0000 [IU] | PEN_INJECTOR | Freq: Two times a day (BID) | SUBCUTANEOUS | 6 refills | Status: DC

## 2020-05-30 MED ORDER — ATORVASTATIN CALCIUM 20 MG PO TABS: 20.0000 mg | ORAL_TABLET | Freq: Every day | ORAL | 6 refills | Status: DC

## 2020-05-30 MED ORDER — TRUE METRIX METER DEVI: 1.0000 | Freq: Three times a day (TID) | 0 refills | Status: DC

## 2020-05-30 MED ORDER — TRUE METRIX BLOOD GLUCOSE TEST VI STRP: ORAL_STRIP | 12 refills | Status: DC

## 2020-05-30 MED FILL — TRUE METRIX TEST STRIP: 33 days supply | Qty: 100 | Fill #0

## 2020-05-30 MED FILL — ATORVASTATIN CALCIUM 20 MG: 20 | 30 days supply | Qty: 30 | Fill #0

## 2020-05-30 MED FILL — TRUEplus LANCETS 28G MISC: 33 days supply | Qty: 100 | Fill #0

## 2020-05-30 MED FILL — !TRUE METRIX BLOOD GLUCOSE: 1 days supply | Qty: 1 | Fill #0

## 2020-05-30 NOTE — Progress Notes (Signed)
Subjective:  Patient ID: Stanley Edwards, male    DOB: July 30, 1965  Age: 54 y.o. MRN: [id number]  CC: Diabetes   HPI Stanley Edwards  is a 54 year old male with Type 2 Diabetes Mellitus (A1c 7.1), anemia  her presents today for chronic disease management.  Endorses compliance with his Novolin 70/30 but has not been compliant with atorvastatin as he states he never received it from the pharmacy. Has not been checking his sugars but denies hypoglycemic symptoms, numbness in extremities or visual concerns. He denies presence of chest pain, pedal edema, dyspnea and has no additional concerns today.  Past Medical History:  Diagnosis Date  . Allergy   . Diabetes mellitus without complication ([place])   . Hypertension    Pt denies htn but it was on his previous hx    History reviewed. No pertinent surgical history.  Family History  Problem Relation Age of Onset  . Cancer Mother   . Diabetes Father     No Known Allergies  Outpatient Medications Prior to Visit  Medication Sig Dispense Refill  . Insulin Pen Needle 32G X 4 MM MISC 1 each by Does not apply route 2 (two) times daily. 100 each 0  . sildenafil (VIAGRA) 50 MG tablet Take 1 tablet (50 mg total) by mouth daily as needed for erectile dysfunction. At least 24 hours between doses 10 tablet 1  . Blood Glucose Monitoring Suppl (TRUE METRIX METER) DEVI 1 each by Does not apply route 3 (three) times daily before meals. 1 Device 0  . glucose blood (TRUE METRIX BLOOD GLUCOSE TEST) test strip Use 3 times daily before meals 100 each 12  . insulin isophane & regular human (NOVOLIN 70/30 FLEXPEN) (70-30) 100 UNIT/ML KwikPen Inject 22 Units into the skin 2 (two) times daily. 15 mL 0  . TRUEplus Lancets 28G MISC 1 each by Does not apply route 3 (three) times daily before meals. 100 each 12  . clotrimazole-betamethasone (LOTRISONE) cream Apply 1 application topically 2 (two) times daily. X 2 weeks, then switch to clotrimazole twice daily until  completely gone. (Patient not taking: Reported on 03/21/2019) 45 g 1  . nystatin (MYCOSTATIN/NYSTOP) powder Apply topically 4 (four) times daily. (Patient not taking: Reported on 03/21/2019) 60 g 1  . atorvastatin (LIPITOR) 20 MG tablet Take 1 tablet (20 mg total) by mouth daily. (Patient not taking: Reported on 05/30/2020) 30 tablet 6   No facility-administered medications prior to visit.     ROS Review of Systems  Constitutional: Negative for activity change and appetite change.  HENT: Negative for sinus pressure and sore throat.   Eyes: Negative for visual disturbance.  Respiratory: Negative for cough, chest tightness and shortness of breath.   Cardiovascular: Negative for chest pain and leg swelling.  Gastrointestinal: Negative for abdominal distention, abdominal pain, constipation and diarrhea.  Endocrine: Negative.   Genitourinary: Negative for dysuria.  Musculoskeletal: Negative for joint swelling and myalgias.  Skin: Negative for rash.  Allergic/Immunologic: Negative.   Neurological: Negative for weakness, light-headedness and numbness.  Psychiatric/Behavioral: Negative for dysphoric mood and suicidal ideas.    Objective:  BP 118/72   Pulse 84   Ht $R'6\' 2"'GG$  (1.88 m)   Wt 209 lb 12.8 oz (95.2 kg)   SpO2 99%   BMI 26.94 kg/m   BP/Weight 05/30/2020 04/19/2019 7/82/9562  Systolic BP 130 865 784  Diastolic BP 72 78 79  Wt. (Lbs) 209.8 208 -  BMI 26.94 26.71 -  Physical Exam Constitutional:      Appearance: He is well-developed.  Neck:     Vascular: No JVD.  Cardiovascular:     Rate and Rhythm: Normal rate.     Heart sounds: Normal heart sounds. No murmur heard.   Pulmonary:     Effort: Pulmonary effort is normal.     Breath sounds: Normal breath sounds. No wheezing or rales.  Chest:     Chest wall: No tenderness.  Abdominal:     General: Bowel sounds are normal. There is no distension.     Palpations: Abdomen is soft. There is no mass.     Tenderness: There  is no abdominal tenderness.  Musculoskeletal:        General: Normal range of motion.     Right lower leg: No edema.     Left lower leg: No edema.  Neurological:     Mental Status: He is alert and oriented to person, place, and time.  Psychiatric:        Mood and Affect: Mood normal.     CMP Latest Ref Rng & Units 04/19/2019 03/24/2019 03/23/2019  Glucose 65 - 99 mg/dL 96 049(P) 319(P)  BUN 6 - 24 mg/dL 14 19(I) 60(V)  Creatinine 0.76 - 1.27 mg/dL 7.91 4.49 7.30(U)  Sodium 134 - 144 mmol/L 140 143 147(H)  Potassium 3.5 - 5.2 mmol/L 4.7 3.0(L) 4.0  Chloride 96 - 106 mmol/L 102 108 113(H)  CO2 20 - 29 mmol/L 26 23 20(L)  Calcium 8.7 - 10.2 mg/dL 9.2 8.8(H) 9.1  Total Protein 6.5 - 8.1 g/dL - - 6.8  Total Bilirubin 0.3 - 1.2 mg/dL - - 3.0(H)  Alkaline Phos 38 - 126 U/L - - 110  AST 15 - 41 U/L - - 23  ALT 0 - 44 U/L - - 12    Lipid Panel     Component Value Date/Time   CHOL 180 01/23/2018 1017   TRIG 94 01/23/2018 1017   HDL 43 01/23/2018 1017   CHOLHDL 4.2 01/23/2018 1017   CHOLHDL 4.7 09/21/2015 0940   VLDL 27 09/21/2015 0940   LDLCALC 118 (H) 01/23/2018 1017    CBC    Component Value Date/Time   WBC 9.2 03/24/2019 0236   RBC 2.88 (L) 03/24/2019 0236   HGB 10.1 (L) 03/24/2019 0236   HGB 12.4 (L) 01/23/2018 1017   HCT 29.7 (L) 03/24/2019 0236   HCT 36.7 (L) 01/23/2018 1017   PLT 244 03/24/2019 0236   PLT 291 01/23/2018 1017   MCV 103.1 (H) 03/24/2019 0236   MCV 100 (H) 01/23/2018 1017   MCH 35.1 (H) 03/24/2019 0236   MCHC 34.0 03/24/2019 0236   RDW 11.9 03/24/2019 0236   RDW 12.3 01/23/2018 1017   LYMPHSABS 2.0 03/24/2019 0236   LYMPHSABS 1.4 01/23/2018 1017   MONOABS 0.5 03/24/2019 0236   EOSABS 0.1 03/24/2019 0236   EOSABS 0.2 01/23/2018 1017   BASOSABS 0.0 03/24/2019 0236   BASOSABS 0.0 01/23/2018 1017    Lab Results  Component Value Date   HGBA1C 7.1 (A) 05/30/2020    Assessment & Plan:  1. Type 2 diabetes mellitus with hyperglycemia, with  long-term current use of insulin (HCC) Controlled with A1c of 7.1; goal is less than 7.0 He has been commended on improvement Continue current regimen Discussed community resources for discount eye exams given he has no medical coverage for referral Counseled on Diabetic diet, my plate method, 563 minutes of moderate intensity exercise/week Blood sugar logs  with fasting goals of 80-120 mg/dl, random of less than 180 and in the event of sugars less than 60 mg/dl or greater than 400 mg/dl encouraged to notify the clinic. Advised on the need for annual eye exams, annual foot exams, Pneumonia vaccine. - POCT glucose (manual entry) - POCT glycosylated hemoglobin (Hb A1C) - Microalbumin / creatinine urine ratio; Future - atorvastatin (LIPITOR) 20 MG tablet; Take 1 tablet (20 mg total) by mouth daily.  Dispense: 30 tablet; Refill: 6 - insulin isophane & regular human (NOVOLIN 70/30 FLEXPEN) (70-30) 100 UNIT/ML KwikPen; Inject 22 Units into the skin 2 (two) times daily.  Dispense: 30 mL; Refill: 6 - CMP14+EGFR; Future - Lipid panel; Future - Blood Glucose Monitoring Suppl (TRUE METRIX METER) DEVI; 1 each by Does not apply route 3 (three) times daily before meals.  Dispense: 1 each; Refill: 0 - TRUEplus Lancets 28G MISC; 1 each by Does not apply route 3 (three) times daily before meals.  Dispense: 100 each; Refill: 12 - glucose blood (TRUE METRIX BLOOD GLUCOSE TEST) test strip; Use 3 times daily before meals  Dispense: 100 each; Refill: 12  2. Screening for colon cancer - Fecal occult blood, imunochemical([facility]/Sun[facility])   Meds ordered this encounter  Medications  . atorvastatin (LIPITOR) 20 MG tablet    Sig: Take 1 tablet (20 mg total) by mouth daily.    Dispense:  30 tablet    Refill:  6  . insulin isophane & regular human (NOVOLIN 70/30 FLEXPEN) (70-30) 100 UNIT/ML KwikPen    Sig: Inject 22 Units into the skin 2 (two) times daily.    Dispense:  30 mL    Refill:  6  . Blood Glucose  Monitoring Suppl (TRUE METRIX METER) DEVI    Sig: 1 each by Does not apply route 3 (three) times daily before meals.    Dispense:  1 each    Refill:  0  . TRUEplus Lancets 28G MISC    Sig: 1 each by Does not apply route 3 (three) times daily before meals.    Dispense:  100 each    Refill:  12  . glucose blood (TRUE METRIX BLOOD GLUCOSE TEST) test strip    Sig: Use 3 times daily before meals    Dispense:  100 each    Refill:  12    Follow-up: Return in about 6 months (around 11/27/2020) for Chronic disease management.       Charlott Rakes, MD, FAAFP. [facility] and [place], [place]   05/30/2020, 5:22 PM

## 2020-05-30 NOTE — Patient Instructions (Signed)
Diabetes Mellitus and Foot Care Foot care is an important part of your health, especially when you have diabetes. Diabetes may cause you to have problems because of poor blood flow (circulation) to your feet and legs, which can cause your skin to:  Become thinner and drier.  Break more easily.  Heal more slowly.  Peel and crack. You may also have nerve damage (neuropathy) in your legs and feet, causing decreased feeling in them. This means that you may not notice minor injuries to your feet that could lead to more serious problems. Noticing and addressing any potential problems early is the best way to prevent future foot problems. How to care for your feet Foot hygiene  Wash your feet daily with warm water and mild soap. Do not use hot water. Then, pat your feet and the areas between your toes until they are completely dry. Do not soak your feet as this can dry your skin.  Trim your toenails straight across. Do not dig under them or around the cuticle. File the edges of your nails with an emery board or nail file.  Apply a moisturizing lotion or petroleum jelly to the skin on your feet and to dry, brittle toenails. Use lotion that does not contain alcohol and is unscented. Do not apply lotion between your toes. Shoes and socks  Wear clean socks or stockings every day. Make sure they are not too tight. Do not wear knee-high stockings since they may decrease blood flow to your legs.  Wear shoes that fit properly and have enough cushioning. Always look in your shoes before you put them on to be sure there are no objects inside.  To break in new shoes, wear them for just a few hours a day. This prevents injuries on your feet. Wounds, scrapes, corns, and calluses  Check your feet daily for blisters, cuts, bruises, sores, and redness. If you cannot see the bottom of your feet, use a mirror or ask someone for help.  Do not cut corns or calluses or try to remove them with medicine.  If you  find a minor scrape, cut, or break in the skin on your feet, keep it and the skin around it clean and dry. You may clean these areas with mild soap and water. Do not clean the area with peroxide, alcohol, or iodine.  If you have a wound, scrape, corn, or callus on your foot, look at it several times a day to make sure it is healing and not infected. Check for: ? Redness, swelling, or pain. ? Fluid or blood. ? Warmth. ? Pus or a bad smell. General instructions  Do not cross your legs. This may decrease blood flow to your feet.  Do not use heating pads or hot water bottles on your feet. They may burn your skin. If you have lost feeling in your feet or legs, you may not know this is happening until it is too late.  Protect your feet from hot and cold by wearing shoes, such as at the beach or on hot pavement.  Schedule a complete foot exam at least once a year (annually) or more often if you have foot problems. If you have foot problems, report any cuts, sores, or bruises to your health care provider immediately. Contact a health care provider if:  You have a medical condition that increases your risk of infection and you have any cuts, sores, or bruises on your feet.  You have an injury that is not   healing.  You have redness on your legs or feet.  You feel burning or tingling in your legs or feet.  You have pain or cramps in your legs and feet.  Your legs or feet are numb.  Your feet always feel cold.  You have pain around a toenail. Get help right away if:  You have a wound, scrape, corn, or callus on your foot and: ? You have pain, swelling, or redness that gets worse. ? You have fluid or blood coming from the wound, scrape, corn, or callus. ? Your wound, scrape, corn, or callus feels warm to the touch. ? You have pus or a bad smell coming from the wound, scrape, corn, or callus. ? You have a fever. ? You have a red line going up your leg. Summary  Check your feet every day  for cuts, sores, red spots, swelling, and blisters.  Moisturize feet and legs daily.  Wear shoes that fit properly and have enough cushioning.  If you have foot problems, report any cuts, sores, or bruises to your health care provider immediately.  Schedule a complete foot exam at least once a year (annually) or more often if you have foot problems. This information is not intended to replace advice given to you by your health care provider. Make sure you discuss any questions you have with your health care provider. Document Revised: 04/06/2019 Document Reviewed: 08/15/2016 Elsevier Patient Education  2020 Elsevier Inc.  

## 2020-06-01 ENCOUNTER — Other Ambulatory Visit: Payer: Self-pay

## 2020-07-05 MED FILL — $HUMULIN 70/30 KWIKPEN: (70-30) 100 | 34 days supply | Qty: 15 | Fill #0

## 2020-07-07 ENCOUNTER — Encounter: Payer: Self-pay | Admitting: Family Medicine

## 2020-08-10 ENCOUNTER — Other Ambulatory Visit: Payer: Self-pay

## 2020-08-10 DIAGNOSIS — Z20822 Contact with and (suspected) exposure to covid-19: Secondary | ICD-10-CM

## 2020-08-12 LAB — NOVEL CORONAVIRUS, NAA: SARS-CoV-2, NAA: NOT DETECTED

## 2020-08-12 LAB — SARS-COV-2, NAA 2 DAY TAT

## 2020-08-13 ENCOUNTER — Telehealth: Payer: Self-pay | Admitting: Family Medicine

## 2020-08-13 NOTE — Telephone Encounter (Signed)
Pt is aware covid 19 test is neg on 08-13-2020

## 2020-08-27 MED FILL — $HUMULIN 70/30 KWIKPEN: (70-30) 100 | 34 days supply | Qty: 15 | Fill #1

## 2020-08-31 ENCOUNTER — Other Ambulatory Visit: Payer: Self-pay

## 2020-08-31 ENCOUNTER — Emergency Department (HOSPITAL_COMMUNITY)
Admission: EM | Admit: 2020-08-31 | Discharge: 2020-09-01 | Disposition: A | Discharge status: Home / Self Care | Payer: Self-pay | Attending: Emergency Medicine | Admitting: Emergency Medicine

## 2020-08-31 DIAGNOSIS — R197 Diarrhea, unspecified: Secondary | ICD-10-CM | POA: Insufficient documentation

## 2020-08-31 DIAGNOSIS — Z5321 Procedure and treatment not carried out due to patient leaving prior to being seen by health care provider: Secondary | ICD-10-CM | POA: Insufficient documentation

## 2020-08-31 DIAGNOSIS — R109 Unspecified abdominal pain: Secondary | ICD-10-CM | POA: Insufficient documentation

## 2020-08-31 LAB — CBC
HCT: 37.3 % — ABNORMAL LOW (ref 39.0–52.0)
Hemoglobin: 12.4 g/dL — ABNORMAL LOW (ref 13.0–17.0)
MCH: 34.2 pg — ABNORMAL HIGH (ref 26.0–34.0)
MCHC: 33.2 g/dL (ref 30.0–36.0)
MCV: 102.8 fL — ABNORMAL HIGH (ref 80.0–100.0)
Platelets: 290 10*3/uL (ref 150–400)
RBC: 3.63 MIL/uL — ABNORMAL LOW (ref 4.22–5.81)
RDW: 10.9 % — ABNORMAL LOW (ref 11.5–15.5)
WBC: 5.4 10*3/uL (ref 4.0–10.5)
nRBC: 0 % (ref 0.0–0.2)

## 2020-08-31 LAB — URINALYSIS, ROUTINE W REFLEX MICROSCOPIC
Bacteria, UA: NONE SEEN
Bilirubin Urine: NEGATIVE
Glucose, UA: >=500 mg/dL — AB
Hgb urine dipstick: NEGATIVE
Ketones, ur: NEGATIVE mg/dL
Leukocytes,Ua: NEGATIVE
Nitrite: NEGATIVE
Protein, ur: NEGATIVE mg/dL
Specific Gravity, Urine: 1.032 — ABNORMAL HIGH (ref 1.005–1.030)
pH: 5 (ref 5.0–8.0)

## 2020-08-31 LAB — COMPREHENSIVE METABOLIC PANEL
ALT: 18 U/L (ref 0–44)
AST: 17 U/L (ref 15–41)
Albumin: 3.4 g/dL — ABNORMAL LOW (ref 3.5–5.0)
Alkaline Phosphatase: 95 U/L (ref 38–126)
Anion gap: 7 (ref 5–15)
BUN: 18 mg/dL (ref 6–20)
CO2: 25 mmol/L (ref 22–32)
Calcium: 8.7 mg/dL — ABNORMAL LOW (ref 8.9–10.3)
Chloride: 103 mmol/L (ref 98–111)
Creatinine, Ser: 1.34 mg/dL — ABNORMAL HIGH (ref 0.61–1.24)
GFR, Estimated: >60 mL/min (ref >60)
Glucose, Bld: 316 mg/dL — ABNORMAL HIGH (ref 70–99)
Potassium: 4.2 mmol/L (ref 3.5–5.1)
Sodium: 135 mmol/L (ref 135–145)
Total Bilirubin: 0.4 mg/dL (ref 0.3–1.2)
Total Protein: 6.6 g/dL (ref 6.5–8.1)

## 2020-08-31 LAB — LIPASE, BLOOD: Lipase: 39 U/L (ref 11–51)

## 2020-08-31 MED ORDER — ONDANSETRON 4 MG PO TBDP
4.0000 mg | ORAL_TABLET | Freq: Once | ORAL | Status: AC | PRN
  Administered 2020-08-31: 4 mg via ORAL
  Filled 2020-08-31: qty 1

## 2020-08-31 NOTE — ED Triage Notes (Signed)
Pt presents to ED POV. Pt c/o stomach painl. Pt reports that he ate chicken that was not cooked properky and began to vomit aprox 45m after eating it. Pt also reports some diarrhea.

## 2020-09-01 NOTE — ED Notes (Signed)
Called pt x2 for vitals, no response. °

## 2020-09-01 NOTE — ED Notes (Signed)
Called for Pt to recheck vitals. No answer x3. 

## 2020-09-02 ENCOUNTER — Encounter (HOSPITAL_COMMUNITY): Payer: Self-pay | Admitting: Emergency Medicine

## 2020-09-02 ENCOUNTER — Emergency Department (HOSPITAL_COMMUNITY)
Admission: EM | Admit: 2020-09-02 | Discharge: 2020-09-02 | Disposition: A | Discharge status: Home / Self Care | Payer: Self-pay | Attending: Emergency Medicine | Admitting: Emergency Medicine

## 2020-09-02 ENCOUNTER — Other Ambulatory Visit: Payer: Self-pay

## 2020-09-02 DIAGNOSIS — R1084 Generalized abdominal pain: Secondary | ICD-10-CM | POA: Insufficient documentation

## 2020-09-02 DIAGNOSIS — R112 Nausea with vomiting, unspecified: Secondary | ICD-10-CM | POA: Insufficient documentation

## 2020-09-02 DIAGNOSIS — Z5321 Procedure and treatment not carried out due to patient leaving prior to being seen by health care provider: Secondary | ICD-10-CM | POA: Insufficient documentation

## 2020-09-02 NOTE — ED Triage Notes (Signed)
Pt reports generalized abd pain, nausea, and vomiting since eating 1/2 of a rotisserie chicken that was undercooked on Friday.  States he was seen in ED on Friday and left due to wait.  States he has been unable to have a BM since Friday.  Pt doesn't want labs repeated.

## 2020-09-02 NOTE — ED Notes (Signed)
Pt leaving AMA, advised to return if symptoms worsen. 

## 2020-09-02 NOTE — ED Notes (Signed)
Pt outside at time of vitals recheck.

## 2020-10-08 MED FILL — $HUMULIN 70/30 KWIKPEN: (70-30) 100 | 34 days supply | Qty: 15 | Fill #2

## 2020-11-28 ENCOUNTER — Other Ambulatory Visit: Payer: Self-pay

## 2020-11-28 MED FILL — Insulin NPH & Regular Susp Pen-Inj 100 Unit/ML (70-30): SUBCUTANEOUS | 68 days supply | Qty: 30 | Fill #0 | Status: AC

## 2020-11-29 ENCOUNTER — Ambulatory Visit: Payer: Self-pay | Admitting: Family Medicine

## 2020-11-30 ENCOUNTER — Other Ambulatory Visit: Payer: Self-pay

## 2021-02-11 ENCOUNTER — Other Ambulatory Visit: Payer: Self-pay

## 2021-02-11 ENCOUNTER — Encounter: Payer: Self-pay | Admitting: Family Medicine

## 2021-02-11 ENCOUNTER — Ambulatory Visit: Payer: Self-pay | Attending: Family Medicine | Admitting: Family Medicine

## 2021-02-11 VITALS — BP 110/69 | HR 80 | Ht 74.0 in | Wt 207.0 lb

## 2021-02-11 DIAGNOSIS — Z1159 Encounter for screening for other viral diseases: Secondary | ICD-10-CM

## 2021-02-11 DIAGNOSIS — Z794 Long term (current) use of insulin: Secondary | ICD-10-CM

## 2021-02-11 DIAGNOSIS — E1165 Type 2 diabetes mellitus with hyperglycemia: Secondary | ICD-10-CM

## 2021-02-11 LAB — GLUCOSE, POCT (MANUAL RESULT ENTRY): POC Glucose: 88 mg/dl (ref 70–99)

## 2021-02-11 LAB — POCT GLYCOSYLATED HEMOGLOBIN (HGB A1C): HbA1c, POC (controlled diabetic range): 6.2 % (ref 0.0–7.0)

## 2021-02-11 MED ORDER — ATORVASTATIN CALCIUM 20 MG PO TABS
ORAL_TABLET | Freq: Every day | ORAL | 6 refills | Status: DC
  Filled 2021-02-11: qty 30, 30d supply, fill #0

## 2021-02-11 MED ORDER — GLIPIZIDE 10 MG PO TABS
10.0000 mg | ORAL_TABLET | Freq: Two times a day (BID) | ORAL | 6 refills | Status: DC
  Filled 2021-02-11: qty 60, 30d supply, fill #0
  Filled 2021-04-02: qty 60, 30d supply, fill #1
  Filled 2021-05-08: qty 60, 30d supply, fill #2
  Filled 2021-06-12: qty 60, 30d supply, fill #3

## 2021-02-11 NOTE — Patient Instructions (Signed)

## 2021-02-11 NOTE — Progress Notes (Signed)
Subjective:  Patient ID: Stanley Edwards, male    DOB: September 01, 1965  Age: 55 y.o. MRN: [id number]  CC: Diabetes   HPI Stanley Edwards  is a 55 year old male with Type 2 Diabetes Mellitus diagnosed 4 years ago (A1c 6.2 ), anemia  who presents today for chronic disease management.  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 A1c is 6.2 down from 7.1. In the past he was unable to tolerate Metformin but has been compliant with his Insulin. Does not check his sugars regularly. He has not been compliant with Atorvastatin 'because his cholesterol is good' even though at his last visit we had discussed the CV benefits of statin in primary prevention in Diabetic patients. He has no additional concerns today.  Past Medical History:  Diagnosis Date   Allergy    Diabetes mellitus without complication (HCC)    Hypertension    Pt denies htn but it was on his previous hx    History reviewed. No pertinent surgical history.  Family History  Problem Relation Age of Onset   Cancer Mother    Diabetes Father     No Known Allergies  Outpatient Medications Prior to Visit  Medication Sig Dispense Refill   insulin isophane & regular human (HUMULIN 70/30 MIX) (70-30) 100 UNIT/ML KwikPen INJECT 22 UNITS INTO THE SKIN 2 (TWO) TIMES DAILY. 30 mL 6   Blood Glucose Monitoring Suppl (TRUE METRIX METER) DEVI 1 each by Does not apply route 3 (three) times daily before meals. (Patient not taking: Reported on 02/11/2021) 1 each 0   clotrimazole-betamethasone (LOTRISONE) cream Apply 1 application topically 2 (two) times daily. X 2 weeks, then switch to clotrimazole twice daily until completely gone. (Patient not taking: No sig reported) 45 g 1   glucose blood (TRUE METRIX BLOOD GLUCOSE TEST) test strip Use 3 times daily before meals (Patient not taking: Reported on 02/11/2021) 100 each 12   Insulin Pen Needle 32G X 4 MM MISC 1 each by Does not apply route 2 (two) times  daily. (Patient not taking: Reported on 02/11/2021) 100 each 0   nystatin (MYCOSTATIN/NYSTOP) powder Apply topically 4 (four) times daily. (Patient not taking: No sig reported) 60 g 1   sildenafil (VIAGRA) 50 MG tablet Take 1 tablet (50 mg total) by mouth daily as needed for erectile dysfunction. At least 24 hours between doses (Patient not taking: Reported on 02/11/2021) 10 tablet 1   TRUEplus Lancets 28G MISC 1 EACH BY DOES NOT APPLY ROUTE 3 (THREE) TIMES DAILY BEFORE MEALS. (Patient not taking: Reported on 02/11/2021) 100 each 12   atorvastatin (LIPITOR) 20 MG tablet TAKE 1 TABLET (20 MG TOTAL) BY MOUTH DAILY. (Patient not taking: Reported on 02/11/2021) 30 tablet 6   No facility-administered medications prior to visit.     ROS Review of Systems  Constitutional:  Negative for activity change and appetite change.  HENT:  Negative for sinus pressure and sore throat.   Eyes:  Negative for visual disturbance.  Respiratory:  Negative for cough, chest tightness and shortness of breath.   Cardiovascular:  Negative for chest pain and leg swelling.  Gastrointestinal:  Negative for abdominal distention, abdominal pain, constipation and diarrhea.  Endocrine: Negative.   Genitourinary:  Negative for dysuria.  Musculoskeletal:  Negative for joint swelling and myalgias.  Skin:  Negative for rash.  Allergic/Immunologic: Negative.   Neurological:  Negative for weakness, light-headedness and numbness.  Psychiatric/Behavioral:  Negative for dysphoric mood and suicidal ideas.    Objective:  BP 110/69   Pulse 80   Ht 6\' 2"  (1.88 m)   Wt 207 lb (93.9 kg)   SpO2 98%   BMI 26.58 kg/m   BP/Weight 02/11/2021 09/02/2020 09/01/2020  Systolic BP 110 130 124  Diastolic BP 69 81 82  Wt. (Lbs) 207 - -  BMI 26.58 - -      Physical Exam Constitutional:      Appearance: He is well-developed.  Neck:     Vascular: No JVD.  Cardiovascular:     Rate and Rhythm: Normal rate.     Heart sounds: Normal heart  sounds. No murmur heard. Pulmonary:     Effort: Pulmonary effort is normal.     Breath sounds: Normal breath sounds. No wheezing or rales.  Chest:     Chest wall: No tenderness.  Abdominal:     General: Bowel sounds are normal. There is no distension.     Palpations: Abdomen is soft. There is no mass.     Tenderness: There is no abdominal tenderness.  Musculoskeletal:        General: Normal range of motion.     Right lower leg: No edema.     Left lower leg: No edema.  Neurological:     Mental Status: He is alert and oriented to person, place, and time.  Psychiatric:        Mood and Affect: Mood normal.    CMP Latest Ref Rng & Units 08/31/2020 04/19/2019 03/24/2019  Glucose 70 - 99 mg/dL 03/26/2019) 96 998(P)  BUN 6 - 20 mg/dL 18 14 382(N)  Creatinine 0.61 - 1.24 mg/dL 05(L) 9.76(B 3.41  Sodium 135 - 145 mmol/L 135 140 143  Potassium 3.5 - 5.1 mmol/L 4.2 4.7 3.0(L)  Chloride 98 - 111 mmol/L 103 102 108  CO2 22 - 32 mmol/L 25 26 23   Calcium 8.9 - 10.3 mg/dL 9.37) 9.2 )  Total Protein 6.5 - 8.1 g/dL 6.6 - -  Total Bilirubin 0.3 - 1.2 mg/dL 0.4 - -  Alkaline Phos 38 - 126 U/L 95 - -  AST 15 - 41 U/L 17 - -  ALT 0 - 44 U/L 18 - -    Lipid Panel     Component Value Date/Time   CHOL 180 01/23/2018 1017   TRIG 94 01/23/2018 1017   HDL 43 01/23/2018 1017   CHOLHDL 4.2 01/23/2018 1017   CHOLHDL 4.7 09/21/2015 0940   VLDL 27 09/21/2015 0940   LDLCALC 118 (H) 01/23/2018 1017    CBC    Component Value Date/Time   WBC 5.4 08/31/2020 1943   RBC 3.63 (L) 08/31/2020 1943   HGB 12.4 (L) 08/31/2020 1943   HGB 12.4 (L) 01/23/2018 1017   HCT 37.3 (L) 08/31/2020 1943   HCT 36.7 (L) 01/23/2018 1017   PLT 290 08/31/2020 1943   PLT 291 01/23/2018 1017   MCV 102.8 (H) 08/31/2020 1943   MCV 100 (H) 01/23/2018 1017   MCH 34.2 (H) 08/31/2020 1943   MCHC 33.2 08/31/2020 1943   RDW 10.9 (L) 08/31/2020 1943   RDW 12.3 01/23/2018 1017   LYMPHSABS 2.0 03/24/2019 0236   LYMPHSABS 1.4  01/23/2018 1017   MONOABS 0.5 03/24/2019 0236   EOSABS 0.1 03/24/2019 0236   EOSABS 0.2 01/23/2018 1017   BASOSABS 0.0 03/24/2019 0236   BASOSABS 0.0 01/23/2018 1017    Lab Results  Component Value Date   HGBA1C 6.2  02/11/2021    Assessment & Plan:  1. 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 Advised on the need for annual eye exams, annual foot exams, Pneumonia vaccine. Advised to comply with statins and I have again discussed CV benefits with him. - POCT glucose (manual entry) - POCT glycosylated hemoglobin (Hb A1C) - glipiZIDE (GLUCOTROL) 10 MG tablet; Take 1 tablet (10 mg total) by mouth 2 (two) times daily before a meal.  Dispense: 60 tablet; Refill: 6 - atorvastatin (LIPITOR) 20 MG tablet; TAKE 1 TABLET (20 MG TOTAL) BY MOUTH DAILY.  Dispense: 30 tablet; Refill: 6 - Basic Metabolic Panel  2. Need for hepatitis C screening test - HCV RNA quant rflx ultra or genotyp([facility])    Meds ordered this encounter  Medications   glipiZIDE (GLUCOTROL) 10 MG tablet    Sig: Take 1 tablet (10 mg total) by mouth 2 (two) times daily before a meal.    Dispense:  60 tablet    Refill:  6    Discontinue insulin   atorvastatin (LIPITOR) 20 MG tablet    Sig: TAKE 1 TABLET (20  MG TOTAL) BY MOUTH DAILY.    Dispense:  30 tablet    Refill:  6    Follow-up: Return in about 2 weeks (around 02/25/2021) for Blood sugar log evaluation; 3 months with PCP.       Hoy Register, MD, FAAFP. [facility] [place], [place] [phone]   02/11/2021, 10:02 PM

## 2021-02-12 LAB — BASIC METABOLIC PANEL
BUN/Creatinine Ratio: 12 (ref 9–20)
BUN: 18 mg/dL (ref 6–24)
CO2: 27 mmol/L (ref 20–29)
Calcium: 9.3 mg/dL (ref 8.7–10.2)
Chloride: 100 mmol/L (ref 96–106)
Creatinine, Ser: 1.45 mg/dL — ABNORMAL HIGH (ref 0.76–1.27)
Glucose: 74 mg/dL (ref 65–99)
Potassium: 4.9 mmol/L (ref 3.5–5.2)
Sodium: 139 mmol/L (ref 134–144)
eGFR: 57 mL/min/{1.73_m2} — ABNORMAL LOW (ref >59)

## 2021-02-12 LAB — HCV RNA QUANT RFLX ULTRA OR GENOTYP: HCV Quant Baseline: NOT DETECTED IU/mL

## 2021-02-15 ENCOUNTER — Telehealth: Payer: Self-pay

## 2021-02-15 NOTE — Telephone Encounter (Signed)
-----   Message from Hoy Register, MD sent at 02/13/2021 12:54 PM EDT ----- Please inform him that kidney function is slightly abnormal but stable compared to previous labs.  Advise him to continue to avoid medications like Aleve which can worsen kidney function and he needs to stay hydrated.

## 2021-02-15 NOTE — Telephone Encounter (Signed)
Patient was called and a voicemail was left informing patient to return phone call for lab results.   CRM created. 

## 2021-02-25 ENCOUNTER — Ambulatory Visit: Payer: Self-pay | Admitting: Pharmacist

## 2021-04-02 ENCOUNTER — Other Ambulatory Visit: Payer: Self-pay

## 2021-05-08 ENCOUNTER — Other Ambulatory Visit: Payer: Self-pay

## 2021-05-09 ENCOUNTER — Other Ambulatory Visit: Payer: Self-pay

## 2021-05-14 ENCOUNTER — Ambulatory Visit: Payer: Self-pay | Admitting: Family Medicine

## 2021-06-12 ENCOUNTER — Other Ambulatory Visit: Payer: Self-pay

## 2021-06-14 ENCOUNTER — Other Ambulatory Visit: Payer: Self-pay

## 2021-06-19 ENCOUNTER — Other Ambulatory Visit: Payer: Self-pay

## 2021-06-28 ENCOUNTER — Telehealth: Payer: Self-pay | Admitting: Family Medicine

## 2021-06-28 NOTE — Telephone Encounter (Signed)
Carina p from [place] [facility] called intrying to schedule fu apt for next week. I dont have anything until 12/21 with Dr Sharon Seller. Please call back

## 2021-07-09 ENCOUNTER — Telehealth: Payer: Self-pay | Admitting: Family Medicine

## 2021-07-09 NOTE — Telephone Encounter (Signed)
Copied from CRM [id number]. Topic: General - Other >> Jul 05, 2021  2:59 PM Stanley Edwards A wrote: Reason for CRM: The patient has called requesting to speak with a member of staff when available  The patient would like to discuss returning to work / [employer] paperwork   The patient would like to be contacted to discuss further prior to coming into the office  The patient has been previously hospitalized for diabetic concerns and feels taht they are unable to return to work as previously directed   Please contact further

## 2021-07-10 NOTE — Telephone Encounter (Signed)
Pt was called and given an earlier appointment.

## 2021-07-16 NOTE — Progress Notes (Deleted)
Patient ID: Stanley Edwards, male    DOB: 1965/12/06  MRN: [id number]  CC: Diabetes Follow-Up   Subjective: Stanley Edwards is a 55 y.o. male who presents for diabetes follow-up.   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  07/17/2021: Glipizide  Atorvastatin  Blood sugars  Diet  Exercise   Patient Active Problem List   Diagnosis Date Noted   Hyperkalemia 03/21/2019   Hyperbilirubinemia 03/21/2019   Hypertension 01/01/2017   Non-compliance 04/03/2016   AKI (acute kidney injury) (HCC) 03/24/2016   Hyperlipidemia 09/24/2015   Noncompliance with medications 09/21/2015   DM2 (diabetes mellitus, type 2) (HCC) 09/19/2015   Hypokalemia 09/19/2015   Anemia 09/19/2015   DKA (diabetic ketoacidoses) 09/16/2015     Current Outpatient Medications on File Prior to Visit  Medication Sig Dispense Refill   atorvastatin (LIPITOR) 20 MG tablet TAKE 1 TABLET (20 MG TOTAL) BY MOUTH DAILY. 30 tablet 6   Blood Glucose Monitoring Suppl (TRUE METRIX METER) DEVI 1 each by Does not apply route 3 (three) times daily before meals. (Patient not taking: Reported on 02/11/2021) 1 each 0   clotrimazole-betamethasone (LOTRISONE) cream Apply 1 application topically 2 (two) times daily. X 2 weeks, then switch to clotrimazole twice daily until completely gone. (Patient not taking: No sig reported) 45 g 1   glipiZIDE (GLUCOTROL) 10 MG tablet Take 1 tablet (10 mg  total) by mouth 2 (two) times daily before a meal. 60 tablet 6   glucose blood (TRUE METRIX BLOOD GLUCOSE TEST) test strip Use 3 times daily before meals (Patient not taking: Reported on 02/11/2021) 100 each 12   Insulin Pen Needle 32G X 4 MM MISC 1 each by Does not apply route 2 (two) times daily. (Patient not taking: Reported on 02/11/2021) 100 each 0   nystatin (MYCOSTATIN/NYSTOP) powder Apply topically 4 (four) times daily. (Patient not taking: No sig reported) 60 g 1   sildenafil (VIAGRA) 50 MG tablet Take 1 tablet (50 mg total) by mouth daily as needed for erectile dysfunction. At least 24 hours between doses (Patient not taking: Reported on 02/11/2021) 10 tablet 1   No current facility-administered medications on file prior to visit.    No Known Allergies  Social History   Socioeconomic History   Marital status: Single    Spouse name: Not on file   Number of children: Not on file   Years of education: Not on file   Highest education level: Not on file  Occupational History   Not on file  Tobacco Use   Smoking status: Former    Types: Cigarettes   Smokeless tobacco: Never  Substance and Sexual Activity   Alcohol use: Yes    Alcohol/week: 1.0 standard drink    Types: 1 Cans of beer per week   Drug use: No   Sexual activity: Yes  Other Topics Concern   Not on file  Social History Narrative   Not  on file   Social Determinants of Health   Financial Resource Strain: Not on file  Food Insecurity: Not on file  Transportation Needs: Not on file  Physical Activity: Not on file  Stress: Not on file  Social Connections: Not on file  Intimate Partner Violence: Not on file    Family History  Problem Relation Age of Onset   Cancer Mother    Diabetes Father     No past surgical history on file.  ROS: Review of Systems Negative except as stated above  PHYSICAL EXAM: There were no vitals taken for this visit.  Physical Exam  {male adult master:310786} {male adult  master:310785}  CMP Latest Ref Rng & Units 02/11/2021 08/31/2020 04/19/2019  Glucose 65 - 99 mg/dL 74 638(L) 96  BUN 6 - 24 mg/dL 18 18 14   Creatinine 0.76 - 1.27 mg/dL ) 3.73(S) 2.87(G  Sodium 134 - 144 mmol/L 139 135 140  Potassium 3.5 - 5.2 mmol/L 4.9 4.2 4.7  Chloride 96 - 106 mmol/L 100 103 102  CO2 20 - 29 mmol/L 27 25 26   Calcium 8.7 - 10.2 mg/dL 9.3 8.11) 9.2  Total Protein 6.5 - 8.1 g/dL - 6.6 -  Total Bilirubin 0.3 - 1.2 mg/dL - 0.4 -  Alkaline Phos 38 - 126 U/L - 95 -  AST 15 - 41 U/L - 17 -  ALT 0 - 44 U/L - 18 -   Lipid Panel     Component Value Date/Time   CHOL 180 01/23/2018 1017   TRIG 94 01/23/2018 1017   HDL 43 01/23/2018 1017   CHOLHDL 4.2 01/23/2018 1017   CHOLHDL 4.7 09/21/2015 0940   VLDL 27 09/21/2015 0940   LDLCALC 118 (H) 01/23/2018 1017    CBC    Component Value Date/Time   WBC 5.4 08/31/2020 1943   RBC 3.63 (L) 08/31/2020 1943   HGB 12.4 (L) 08/31/2020 1943   HGB 12.4 (L) 01/23/2018 1017   HCT 37.3 (L) 08/31/2020 1943   HCT 36.7 (L) 01/23/2018 1017   PLT 290 08/31/2020 1943   PLT 291 01/23/2018 1017   MCV 102.8 (H) 08/31/2020 1943   MCV 100 (H) 01/23/2018 1017   MCH 34.2 (H) 08/31/2020 1943   MCHC 33.2 08/31/2020 1943   RDW 10.9 (L) 08/31/2020 1943   RDW 12.3 01/23/2018 1017   LYMPHSABS 2.0 03/24/2019 0236   LYMPHSABS 1.4 01/23/2018 1017   MONOABS 0.5 03/24/2019 0236   EOSABS 0.1 03/24/2019 0236   EOSABS 0.2 01/23/2018 1017   BASOSABS 0.0 03/24/2019 0236   BASOSABS 0.0 01/23/2018 1017    ASSESSMENT AND PLAN:  There are no diagnoses linked to this encounter.   Patient was given the opportunity to ask questions.  Patient verbalized understanding of the plan and was able to repeat key elements of the plan. Patient was given clear instructions to go to Emergency Department or return to medical center if symptoms don't improve, worsen, or new problems develop.The patient verbalized understanding.   No orders of the defined types  were placed in this encounter.    Requested Prescriptions    No prescriptions requested or ordered in this encounter    No follow-ups on file.  03/26/2019, NP

## 2021-07-17 ENCOUNTER — Encounter (INDEPENDENT_AMBULATORY_CARE_PROVIDER_SITE_OTHER): Payer: Self-pay

## 2021-07-17 ENCOUNTER — Encounter: Payer: Self-pay | Admitting: Nurse Practitioner

## 2021-07-17 ENCOUNTER — Ambulatory Visit (INDEPENDENT_AMBULATORY_CARE_PROVIDER_SITE_OTHER): Payer: 59 | Admitting: Nurse Practitioner

## 2021-07-17 ENCOUNTER — Other Ambulatory Visit: Payer: Self-pay

## 2021-07-17 ENCOUNTER — Ambulatory Visit: Payer: Self-pay | Admitting: Family

## 2021-07-17 ENCOUNTER — Ambulatory Visit: Payer: Self-pay | Admitting: Family Medicine

## 2021-07-17 ENCOUNTER — Encounter: Payer: 59 | Admitting: Family

## 2021-07-17 VITALS — BP 118/83 | HR 89

## 2021-07-17 DIAGNOSIS — E119 Type 2 diabetes mellitus without complications: Secondary | ICD-10-CM | POA: Diagnosis not present

## 2021-07-17 DIAGNOSIS — Z794 Long term (current) use of insulin: Secondary | ICD-10-CM

## 2021-07-17 LAB — POCT GLYCOSYLATED HEMOGLOBIN (HGB A1C): Hemoglobin A1C: 11.9 % — AB (ref 4.0–5.6)

## 2021-07-17 NOTE — Assessment & Plan Note (Signed)
Continue current dosage of insulin  Will schedule an appointment with Franky Macho and a follow up with Dr. Alvis Lemmings  Will fill out FMLA until seen by Dr. Alvis Lemmings at upcoming visit  HGBA1C is trending down from 14 (October) down to 11.9 (today)   Follow up:  Follow up with Dr. Alvis Lemmings and Franky Macho as scheduled

## 2021-07-17 NOTE — Progress Notes (Signed)
@Patient  ID: , male    DOB: Sep 30, 1965, 55 y.o.   MRN: [id number]  Chief Complaint  Patient presents with   Diabetes    Referring provider: [id number], MD  Recent significant events:  02/11/21 - Office Visit with Dr. 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 male who presented to the ED with complaints of generalized weakness. Patient has history of diabetes and was taken off of insulin about 3 months ago and placed on glipizide 10 mg twice daily. He was taking the glipizide and states that his repeat hemoglobin A1c was good off of insulin. Unfortunately patient ran out of glipizide and he began feeling weak. Because he was weak he was unable to pick up the glipizide prescription. He does not check his blood sugars routinely. He was worried that he might be in diabetic ketoacidosis. His last episode of DKA was in 2020. He has been having increased thirst and urination. He does now have the glipizide prescription at home but has not been able to take it. He is not having any nausea and vomiting.   In the emergency department his blood sugar was found to be 638.   Last A1c 6.2 A1c here was 14.5 Patient will need to resume glipizide 10 mg twice daily at time of discharge and he has this medication already at home, but a written prescription was also provided at discharge.  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 He was continued on glipizide 10mg  two times a day. He was then admitted to [facility] on 05/21/2021 for DKA and at discharge he refused insulin and his glipizide was continued.   Today he presents for increase of fatigue and weight loss for the past three weeks. He has been unable to check his blood sugars at home but he has been taking his glipizide as directed. He has experienced a few episodes of vomiting but those have subsided. He reports polyuria, and polydipsia. No known recent illness or infection. He denies any abdominal pain, chest pain, shortness of breath, or dizziness.    Discharge instructions   As directed   1. You have been discharged on the following medication which is new: --NovoLog 70/30. Please inject 15 units into the skin twice daily. This will help better control your blood sugar. --We have also sent in new prescription for a glucometer. We would recommend checking your blood sugar at least twice daily. Please record those numbers and take them to your follow-up appointment on Tuesday.  2. We have made an appointment for you with a diabetes specialist in [place]. That appointment is on Tuesday, December 6. Please do not miss this appointment.  3. Please call your primary care provider and obtain a hospital follow-up appointment preferably within 1 week but  as soon as possible.  06/30/21 - ED note: Patient is a 55yo male who presents to [facility] ED on 06/30/21 after experiencing excess fatigue, nausea, and vomiting for the past four days. He has a PMH significant for diabetes. He visited [facility] ED on Friday (12/2) and was admitted for DKA. He was switched from Glipizide to an insulin regimen. He returned today because his symptoms have not improved and he still has no energy and is vomiting. Additionally, he has polyuria, polydipsia, headache. No headache, chest pain, diarrhea, constipation, loss of consciousness.  He states he does not see a PCP or endocrinologist regularly to manage his diabetes and the earliest he could get in was early January.   HPI  Patient presents today because he wants FMLA paperwork filled out due to his diabetes.  Patient states that he feels too weak to walk up the hill through the parking lot to get to his work.  Please see recent past medical history as noted above.  Patient was seen by Dr. Maurice Edwards in July 2022.  His diabetes was under control at that time and his insulin was discontinued.  Patient was told to follow-up with pharmacy for medication management and to follow with Dr. [name].  Patient no showed both of those appointments.  He was then seen in the ED as noted above in October.  He was continued on glipizide at that time.  Patient returned to the ED on 06/28/2021 for hyperglycemia.  Patient was referred to diabetic specialist through ED on 06/28/21 and appointment was set for December 6 - patient no showed for that appointment also.  Patient returned to the ED on 12/422 for dehydration and weakness.  Patient was told to continue glipizide and insulin.  Patient reports that he is not taking glipizide and only insulin at this time.  Patient's hemoglobin A1c was noted to be 14 in October and is down to 11.9 today.  Patient states that he feels as though the insulin is helping.  We discussed that he will need to follow-up with Stanley Edwards and [facility] and Dr. [name] for diabetic management.  I will fill out FMLA paperwork through 08/01/2021 which will be his next appointment with Dr. [name]. Denies f/c/s, n/v/d, hemoptysis, PND, chest pain or edema.    No Known Allergies  Immunization History  Administered Date(s) Administered   Pneumococcal Polysaccharide-23 01/23/2018   Tdap 01/23/2018    Past Medical History:  Diagnosis Date   Allergy    Diabetes mellitus without complication (HCC)    Hypertension    Pt denies htn but it was on his previous hx     Tobacco History: Social History   Tobacco Use  Smoking Status Former   Types: Cigarettes  Smokeless Tobacco Never   Counseling given: Yes   Outpatient Encounter Medications as of 07/17/2021  Medication Sig   atorvastatin (LIPITOR) 20 MG tablet TAKE 1 TABLET (20 MG TOTAL) BY MOUTH DAILY.   Blood Glucose Monitoring Suppl (TRUE METRIX METER) DEVI 1 each by Does not apply route 3 (three) times daily before meals. (Patient not taking: Reported on 02/11/2021)   clotrimazole-betamethasone (LOTRISONE) cream Apply 1 application topically 2 (two) times daily. X 2 weeks, then switch to clotrimazole twice daily until completely gone. (Patient not taking: No sig reported)   glipiZIDE (GLUCOTROL) 10 MG tablet Take 1 tablet (10 mg total) by mouth 2 (two) times daily before a meal.   glucose blood (TRUE METRIX BLOOD GLUCOSE TEST) test  strip Use 3 times daily before meals (Patient not taking: Reported on 02/11/2021)   Insulin Pen Needle 32G X 4 MM MISC 1 each by Does not apply route 2 (two) times daily. (Patient not taking: Reported on 02/11/2021)   nystatin (MYCOSTATIN/NYSTOP) powder Apply topically 4 (four) times daily. (Patient not taking: No sig reported)   sildenafil (VIAGRA) 50 MG tablet Take 1 tablet (50 mg total) by mouth daily as needed for erectile dysfunction. At least 24 hours between doses (Patient not taking: Reported on 02/11/2021)   No facility-administered encounter medications on file as of 07/17/2021.     Review of Systems  Review of Systems  Constitutional:  Positive for fatigue.  HENT: Negative.    Cardiovascular: Negative.   Gastrointestinal: Negative.   Allergic/Immunologic: Negative.   Neurological: Negative.   Psychiatric/Behavioral: Negative.        Physical Exam  BP 118/83    Pulse 89    SpO2 100%   Wt Readings from Last 5 Encounters:  02/11/21 207 lb (93.9 kg)  05/30/20 209 lb 12.8 oz (95.2 kg)  04/19/19 208 lb (94.3 kg)  03/22/19 174 lb 2.6 oz (79 kg)   01/23/18 214 lb 9.6 oz (97.3 kg)     Physical Exam Vitals and nursing note reviewed.  Constitutional:      General: He is not in acute distress.    Appearance: He is well-developed.  Cardiovascular:     Rate and Rhythm: Normal rate and regular rhythm.  Pulmonary:     Effort: Pulmonary effort is normal.     Breath sounds: Normal breath sounds.  Skin:    General: Skin is warm and dry.  Neurological:     Mental Status: He is alert and oriented to person, place, and time.      Assessment & Plan:   DM2 (diabetes mellitus, type 2) (HCC) Continue current dosage of insulin  Will schedule an appointment with Stanley Edwards and a follow up with Dr. [name]  Will fill out FMLA until seen by Dr. [name] at upcoming visit  HGBA1C is trending down from 14 (October) down to 11.9 (today)   Follow up:  Follow up with Dr. [name] and Stanley Edwards as scheduled     Ivonne Andrew, NP 07/17/2021

## 2021-07-17 NOTE — Progress Notes (Signed)
Erroneous encounter

## 2021-07-17 NOTE — Patient Instructions (Addendum)
Diabetes:  Continue current dosage of insulin  Will schedule an appointment with Franky Macho and a follow up with Dr. Alvis Lemmings  Will fill out FMLA until seen by Dr. Alvis Lemmings at upcoming visit  HGBA1C is trending down from 14 (October) down to 11.9 (today)   Follow up:  Follow up with Dr. Alvis Lemmings and Franky Macho as scheduled

## 2021-07-31 ENCOUNTER — Other Ambulatory Visit: Payer: Self-pay | Admitting: *Deleted

## 2021-07-31 ENCOUNTER — Ambulatory Visit: Payer: Self-pay | Admitting: Family Medicine

## 2021-08-01 ENCOUNTER — Other Ambulatory Visit: Payer: Self-pay

## 2021-08-01 ENCOUNTER — Other Ambulatory Visit (HOSPITAL_COMMUNITY): Payer: Self-pay

## 2021-08-01 ENCOUNTER — Ambulatory Visit: Payer: 59 | Attending: Family Medicine | Admitting: Family Medicine

## 2021-08-01 ENCOUNTER — Encounter: Payer: Self-pay | Admitting: Family Medicine

## 2021-08-01 VITALS — BP 109/71 | HR 85 | Temp 98.1°F | Resp 16 | Ht 74.0 in | Wt 170.0 lb

## 2021-08-01 DIAGNOSIS — E1165 Type 2 diabetes mellitus with hyperglycemia: Secondary | ICD-10-CM | POA: Diagnosis not present

## 2021-08-01 DIAGNOSIS — Z029 Encounter for administrative examinations, unspecified: Secondary | ICD-10-CM

## 2021-08-01 DIAGNOSIS — Z794 Long term (current) use of insulin: Secondary | ICD-10-CM

## 2021-08-01 LAB — GLUCOSE, POCT (MANUAL RESULT ENTRY)
POC Glucose: 495 mg/dl — AB (ref 70–99)
POC Glucose: 460 mg/dl — AB (ref 70–99)

## 2021-08-01 MED ORDER — INSULIN ASPART PROT & ASPART (70-30 MIX) 100 UNIT/ML ~~LOC~~ SUSP
20.0000 [IU] | Freq: Two times a day (BID) | SUBCUTANEOUS | 1 refills | Status: DC
  Filled 2021-08-01: qty 30, 75d supply, fill #0
  Filled 2021-08-26 – 2021-09-06 (×3): qty 10, 25d supply, fill #0

## 2021-08-01 MED ORDER — ATORVASTATIN CALCIUM 20 MG PO TABS
20.0000 mg | ORAL_TABLET | Freq: Every day | ORAL | 1 refills | Status: DC
  Filled 2021-08-01 – 2021-08-26 (×3): qty 30, 30d supply, fill #0

## 2021-08-01 MED ORDER — INSULIN ASPART 100 UNIT/ML IJ SOLN
25.0000 [IU] | Freq: Once | INTRAMUSCULAR | Status: AC
  Administered 2021-08-01: 25 [IU] via SUBCUTANEOUS

## 2021-08-01 NOTE — Progress Notes (Signed)
Fill out paperwork for FMLA- last day of work was Dec.5th  Fatigue, intermittent admissions, weigh loss

## 2021-08-01 NOTE — Progress Notes (Signed)
Subjective:  Patient ID: ADE STMARIE, male    DOB: 01-23-1966  Age: 56 y.o. MRN: [id number]  CC: paperwork   HPI Stanley Edwards is a 56 y.o. year old male with a history of Type 2 Diabetes Mellitus diagnosed 4 years ago (A1c 11.9 ), anemia  who presents today for chronic disease management.  Interval History: He is trying to get short term disability as he already has FMLA. He has been in and out of the hospital and he has not had a paycheck since 12/5 He has lack of energy, loosing balance, has fallen and has also had decreased appetite. 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  A1c was 6.2 at the time and glipizide was initiated.  He was scheduled to come back in 2 weeks to follow-up with the clinical pharmacist to reassess his blood sugars and add an SGLT2 inhibitor if indicated and was also to follow-up with me in 3 months but he never did.  He was hospitalized at [facility] for hyperglycemia in 04/2021 when he had run out of glipizide and was found to be in hyperosmolar hyperglycemic state.  He was hospitalized again in 12/2/202 and glipizide discontinued and he was placed on insulin.  Blood sugar at home has been in the 300s and his blood sugar in the clinic today is 495.  He endorses compliance with his insulin. Past Medical History:  Diagnosis Date   Allergy    Diabetes mellitus without complication ([facility])    Hypertension    Pt denies htn but it was on his previous hx    No past surgical history on file.  Family History  Problem Relation Age of Onset   Cancer Mother    Diabetes Father     No Known Allergies  Outpatient Medications Prior to Visit  Medication Sig Dispense Refill   glipiZIDE (GLUCOTROL) 10 MG tablet Take 1 tablet (10 mg total) by mouth 2 (two) times daily before a meal. 60 tablet 6   insulin aspart protamine- aspart (NOVOLOG MIX 70/30) (70-30) 100 UNIT/ML  injection Inject 13 Units into the skin 2 (two) times daily with a meal. 13 unit in am and 15 unit HS     Blood Glucose Monitoring Suppl (TRUE METRIX METER) DEVI 1 each by Does not apply route 3 (three) times daily before meals. (Patient not taking: Reported on 08/01/2021) 1 each 0   glucose blood (TRUE METRIX BLOOD GLUCOSE TEST) test strip Use 3 times daily before meals (Patient not taking: Reported on 02/11/2021) 100 each 12   Insulin Pen Needle 32G X 4 MM MISC 1 each by Does not apply route 2 (two) times daily. (Patient not taking: Reported on 02/11/2021) 100 each 0   atorvastatin (LIPITOR) 20 MG tablet TAKE 1 TABLET (20 MG TOTAL) BY MOUTH DAILY. (Patient not taking: Reported on 08/01/2021) 30 tablet 6   clotrimazole-betamethasone (LOTRISONE) cream Apply 1 application topically 2 (two) times daily. X 2 weeks, then switch to clotrimazole twice daily until completely gone. (Patient not taking: Reported on 08/01/2021) 45 g 1   nystatin (MYCOSTATIN/NYSTOP) powder Apply topically 4 (four) times daily. (Patient not taking: Reported on 08/01/2021) 60 g 1   sildenafil (VIAGRA) 50 MG tablet Take 1 tablet (50 mg total) by mouth daily as needed for erectile dysfunction. At least 24 hours between doses (Patient not taking: Reported on 02/11/2021) 10  tablet 1   No facility-administered medications prior to visit.     ROS Review of Systems  Constitutional:  Positive for appetite change, fatigue and unexpected weight change. Negative for activity change.  HENT:  Negative for sinus pressure and sore throat.   Eyes:  Negative for visual disturbance.  Respiratory:  Negative for cough, chest tightness and shortness of breath.   Cardiovascular:  Negative for chest pain and leg swelling.  Gastrointestinal:  Negative for abdominal distention, abdominal pain, constipation and diarrhea.  Endocrine: Negative.   Genitourinary:  Negative for dysuria.  Musculoskeletal:  Negative for joint swelling and myalgias.  Skin:  Negative  for rash.  Allergic/Immunologic: Negative.   Neurological:  Negative for weakness, light-headedness and numbness.  Psychiatric/Behavioral:  Negative for dysphoric mood and suicidal ideas.    Objective:  BP 109/71    Pulse 85    Temp 98.1 F (36.7 C) (Oral)    Resp 16    Ht _0  (1.88 m)    Wt 170 lb (77.1 kg)    SpO2 98%    BMI 21.83 kg/m   BP/Weight 08/01/2021 07/17/2021 09/18/9796  Systolic BP 921 194 174  Diastolic BP 71 83 69  Wt. (Lbs) 170 - 207  BMI 21.83 - 26.58      Physical Exam Constitutional:      Appearance: He is well-developed.     Comments: Chronically ill looking  Cardiovascular:     Rate and Rhythm: Normal rate.     Heart sounds: Normal heart sounds. No murmur heard. Pulmonary:     Effort: Pulmonary effort is normal.     Breath sounds: Normal breath sounds. No wheezing or rales.  Chest:     Chest wall: No tenderness.  Abdominal:     General: Bowel sounds are normal. There is no distension.     Palpations: Abdomen is soft. There is no mass.     Tenderness: There is no abdominal tenderness.  Musculoskeletal:        General: Normal range of motion.     Right lower leg: No edema.     Left lower leg: No edema.  Neurological:     Mental Status: He is alert and oriented to person, place, and time.  Psychiatric:        Mood and Affect: Mood normal.    CMP Latest Ref Rng & Units 02/11/2021 08/31/2020 04/19/2019  Glucose 65 - 99 mg/dL 74 316(H) 96  BUN 6 - 24 mg/dL _1 Creatinine 0.76 - 1.27 mg/dL 1.45(H) 1.34(H) 1.16  Sodium 134 - 144 mmol/L 139 135 140  Potassium 3.5 - 5.2 mmol/L 4.9 4.2 4.7  Chloride 96 - 106 mmol/L 100 103 102  CO2 20 - 29 mmol/L _2 Calcium 8.7 - 10.2 mg/dL 9.3 8.7(L) 9.2  Total Protein 6.5 - 8.1 g/dL - 6.6 -  Total Bilirubin 0.3 - 1.2 mg/dL - 0.4 -  Alkaline Phos 38 - 126 U/L - 95 -  AST 15 - 41 U/L - 17 -  ALT 0 - 44 U/L - 18 -    Lipid Panel     Component Value Date/Time   CHOL 180 01/23/2018 1017   TRIG 94  01/23/2018 1017   HDL 43 01/23/2018 1017   CHOLHDL 4.2 01/23/2018 1017   CHOLHDL 4.7 09/21/2015 0940   VLDL 27 09/21/2015 0940   LDLCALC 118 (H) 01/23/2018 1017    CBC    Component Value Date/Time  WBC 5.4 08/31/2020 1943   RBC 3.63 (L) 08/31/2020 1943   HGB 12.4 (L) 08/31/2020 1943   HGB 12.4 (L) 01/23/2018 1017   HCT 37.3 (L) 08/31/2020 1943   HCT 36.7 (L) 01/23/2018 1017   PLT 290 08/31/2020 1943   PLT 291 01/23/2018 1017   MCV 102.8 (H) 08/31/2020 1943   MCV 100 (H) 01/23/2018 1017   MCH 34.2 (H) 08/31/2020 1943   MCHC 33.2 08/31/2020 1943   RDW 10.9 (L) 08/31/2020 1943   RDW 12.3 01/23/2018 1017   LYMPHSABS 2.0 03/24/2019 0236   LYMPHSABS 1.4 01/23/2018 1017   MONOABS 0.5 03/24/2019 0236   EOSABS 0.1 03/24/2019 0236   EOSABS 0.2 01/23/2018 1017   BASOSABS 0.0 03/24/2019 0236   BASOSABS 0.0 01/23/2018 1017    Lab Results  Component Value Date   HGBA1C 11.9 (A) 07/17/2021    Assessment & Plan:  1. Type 2 diabetes mellitus with hyperglycemia, with long-term current use of insulin (HCC) Uncontrolled with A1c of 11.9; goal is less than 7.0 Severe hyperglycemia of 495 at risk of progressing to DKA This could explain his ongoing symptoms NovoLog 25 units administered along with oral hydration and patient observed for 35 minutes after meal blood sugar repeated Will increase NovoLog 70/30 from 15 units twice daily to 20 units twice daily He will follow-up with the clinical pharmacist for up titration of his NovoLog He has not been compliant with his statin hence I will make no regimen changes if lipid panel is elevated Counseled on Diabetic diet, my plate method, 948 minutes of moderate intensity exercise/week Blood sugar logs with fasting goals of 80-120 mg/dl, random of less than 180 and in the event of sugars less than 60 mg/dl or greater than 400 mg/dl encouraged to notify the clinic. Advised on the need for annual eye exams, annual foot exams, Pneumonia vaccine. -  atorvastatin (LIPITOR) 20 MG tablet; Take 1 tablet (20 mg total) by mouth daily.  Dispense: 30 tablet; Refill: 1 - insulin aspart protamine- aspart (NOVOLOG MIX 70/30) (70-30) 100 UNIT/ML injection; Inject 0.2 mLs (20 Units total) into the skin 2 (two) times daily with a meal.  Dispense: 30 mL; Refill: 1 - Microalbumin / creatinine urine ratio - LP+Non-HDL Cholesterol - CMP14+EGFR - insulin aspart (novoLOG) injection 25 Units - Glucose (CBG) - Glucose (CBG)  2. Administrative encounter Completion of form for short-term disability as acute illness is secondary to hyperglycemia as a result of uncontrolled diabetes mellitus Hopefully should be able to return to work in 3 months once his glycemia is under control.    Meds ordered this encounter  Medications   atorvastatin (LIPITOR) 20 MG tablet    Sig: Take 1 tablet (20 mg total) by mouth daily.    Dispense:  30 tablet    Refill:  1   insulin aspart protamine- aspart (NOVOLOG MIX 70/30) (70-30) 100 UNIT/ML injection    Sig: Inject 0.2 mLs (20 Units total) into the skin 2 (two) times daily with a meal.    Dispense:  30 mL    Refill:  1    Dose increase   insulin aspart (novoLOG) injection 25 Units    Follow-up: Return in about 2 weeks (around 08/15/2021) for Blood sugar evaluation with Lurena Joiner; 3 months with PCP - Diabetes.       Charlott Rakes, MD, FAAFP. [facility], [place]   08/01/2021, 2:16 PM

## 2021-08-02 LAB — CMP14+EGFR
ALT: 18 IU/L (ref 0–44)
AST: 12 IU/L (ref 0–40)
Albumin/Globulin Ratio: 1.9 (ref 1.2–2.2)
Albumin: 4.8 g/dL (ref 3.8–4.9)
Alkaline Phosphatase: 102 IU/L (ref 44–121)
BUN/Creatinine Ratio: 19 (ref 9–20)
BUN: 25 mg/dL — ABNORMAL HIGH (ref 6–24)
Bilirubin Total: <0.2 mg/dL (ref 0.0–1.2)
CO2: 25 mmol/L (ref 20–29)
Calcium: 10.4 mg/dL — ABNORMAL HIGH (ref 8.7–10.2)
Chloride: 93 mmol/L — ABNORMAL LOW (ref 96–106)
Creatinine, Ser: 1.33 mg/dL — ABNORMAL HIGH (ref 0.76–1.27)
Globulin, Total: 2.5 g/dL (ref 1.5–4.5)
Glucose: 499 mg/dL — ABNORMAL HIGH (ref 70–99)
Potassium: 5.1 mmol/L (ref 3.5–5.2)
Sodium: 135 mmol/L (ref 134–144)
Total Protein: 7.3 g/dL (ref 6.0–8.5)
eGFR: 63 mL/min/{1.73_m2} (ref >59)

## 2021-08-02 LAB — MICROALBUMIN / CREATININE URINE RATIO
Creatinine, Urine: 33.9 mg/dL
Microalb/Creat Ratio: 15 mg/g creat (ref 0–29)
Microalbumin, Urine: 5 ug/mL

## 2021-08-02 LAB — LP+NON-HDL CHOLESTEROL
Cholesterol, Total: 347 mg/dL — ABNORMAL HIGH (ref 100–199)
HDL: 77 mg/dL (ref >39)
LDL Chol Calc (NIH): 254 mg/dL — ABNORMAL HIGH (ref 0–99)
Total Non-HDL-Chol (LDL+VLDL): 270 mg/dL — ABNORMAL HIGH (ref 0–129)
Triglycerides: 100 mg/dL (ref 0–149)
VLDL Cholesterol Cal: 16 mg/dL (ref 5–40)

## 2021-08-06 ENCOUNTER — Other Ambulatory Visit: Payer: Self-pay

## 2021-08-06 ENCOUNTER — Ambulatory Visit: Payer: Self-pay | Attending: Family Medicine | Admitting: Pharmacist

## 2021-08-07 ENCOUNTER — Telehealth: Payer: Self-pay

## 2021-08-07 NOTE — Telephone Encounter (Signed)
Patient name and DOB has been verified Patient was informed of lab results. Patient had no questions.  

## 2021-08-07 NOTE — Telephone Encounter (Signed)
-----   Message from Hoy Register, MD sent at 08/02/2021  7:17 PM EST ----- Please inform him that his Cholesterol is elevated which could be due to the fact that he had not been taking his Statin. Please advise to resume atorvastatin and continue to work on a low cholesterol diet. Glucose is elevated and I had already made regimen changes at his last visit

## 2021-08-08 ENCOUNTER — Ambulatory Visit: Payer: Self-pay | Admitting: Pharmacist

## 2021-08-08 NOTE — Telephone Encounter (Signed)
Copied from CRM [id number]. Topic: Appointment Scheduling - Scheduling Inquiry for Clinic >> Aug 08, 2021 10:50 AM Marylen Ponto wrote: Reason for CRM: Pt stated he is having car problems so he will not be able to make the appt today at 11 am with [facility]. Pt requests call back to reschedule appt. Cb# [phone]  I return Pt call, reschedule appt for 08/26/21

## 2021-08-15 ENCOUNTER — Ambulatory Visit: Payer: Self-pay | Admitting: Pharmacist

## 2021-08-20 ENCOUNTER — Other Ambulatory Visit: Payer: Self-pay

## 2021-08-22 NOTE — Progress Notes (Unsigned)
° °  PCP: Dr. Alvis Lemmings  S:    Patient presents for diabetes evaluation, education, and management. Patient was referred and last seen by Primary Care Provider on 08/01/21. BG was 495 and 70/30 insulin was increased. Multiple ED visits in the past year for DKA/HHS. Hx metformin intolerance.   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   Family/Social History: Former smoker. DM in father.   Insurance coverage/medication affordability: self pay  Medication adherence reported *** .   Current diabetes medications include: glipizide 10 mg BIDAC, Novolog 70/30 20 units BID Current hyperlipidemia medications include: atorvastatin 20 mg daily  Patient {Actions; denies-reports:120008} hypoglycemic events.  Patient reported dietary habits: Eats *** meals/day Breakfast:*** Lunch:*** Dinner:*** Snacks:*** Drinks:***  Patient-reported exercise habits: ***   Patient {Actions; denies-reports:120008} nocturia (nighttime urination).  Patient {Actions; denies-reports:120008} neuropathy (nerve pain). Patient {Actions; denies-reports:120008} visual changes. Patient {Actions; denies-reports:120008} self foot exams.     O:   Lab Results  Component Value Date   HGBA1C 11.9 (A) 07/17/2021   There were no vitals filed for this visit.  Lipid Panel     Component Value Date/Time   CHOL 347 (H) 08/01/2021 1202   TRIG 100 08/01/2021 1202   HDL 77 08/01/2021 1202   CHOLHDL 4.2 01/23/2018 1017   CHOLHDL 4.7 09/21/2015 0940   VLDL 27 09/21/2015 0940   LDLCALC 254 (H) 08/01/2021 1202    Home fasting blood sugars: ***  2 hour post-meal/random blood sugars: ***.  Clinical Atherosclerotic Cardiovascular Disease (ASCVD): No  The ASCVD Risk score (Arnett DK, et al., 2019) failed to calculate for the following reasons:   The valid total cholesterol range is  130 to 320 mg/dL   A/P: Diabetes longstanding*** currently uncontrolled with most recent A1c 11.9 07/17/21 (up from 6.2 in July 2022). Patient is *** able to verbalize appropriate hypoglycemia management plan. Medication adherence appears ***. Control is suboptimal due to ***. -*** -Extensively discussed pathophysiology of diabetes, recommended lifestyle interventions, dietary effects on blood sugar control -Counseled on s/sx of and management of hypoglycemia -Next A1C anticipated 09/2021.   ASCVD risk - primary***secondary prevention in patient with diabetes. Last LDL {Is/is not:9024} controlled. ASCVD risk score {Is/is not:9024} >20%  - {Desc; low/moderate/high:110033} intensity statin indicated. Aspirin {Is/is not:9024} indicated.  -{Meds adjust:18428} aspirin *** mg  -{Meds adjust:18428} ***statin *** mg.    Verbal patient instructions provided.  Total time in face to face counseling *** minutes.   Follow up Pharmacist/PCP*** Clinic Visit in ***.

## 2021-08-26 ENCOUNTER — Other Ambulatory Visit: Payer: Self-pay

## 2021-08-26 ENCOUNTER — Ambulatory Visit: Payer: Self-pay | Admitting: Pharmacist

## 2021-08-26 ENCOUNTER — Other Ambulatory Visit (HOSPITAL_COMMUNITY): Payer: Self-pay

## 2021-08-30 ENCOUNTER — Other Ambulatory Visit: Payer: Self-pay

## 2021-09-02 ENCOUNTER — Other Ambulatory Visit: Payer: Self-pay

## 2021-09-06 ENCOUNTER — Other Ambulatory Visit: Payer: Self-pay

## 2021-09-08 ENCOUNTER — Inpatient Hospital Stay (HOSPITAL_COMMUNITY)
Admission: EM | Admit: 2021-09-08 | Discharge: 2021-09-10 | DRG: 638 | Disposition: A | Discharge status: Home / Self Care | Payer: 59 | Attending: Internal Medicine | Admitting: Internal Medicine

## 2021-09-08 ENCOUNTER — Encounter (HOSPITAL_COMMUNITY): Payer: Self-pay | Admitting: Emergency Medicine

## 2021-09-08 ENCOUNTER — Observation Stay (HOSPITAL_COMMUNITY): Payer: 59

## 2021-09-08 DIAGNOSIS — E860 Dehydration: Secondary | ICD-10-CM | POA: Diagnosis present

## 2021-09-08 DIAGNOSIS — Z79899 Other long term (current) drug therapy: Secondary | ICD-10-CM

## 2021-09-08 DIAGNOSIS — Z20822 Contact with and (suspected) exposure to covid-19: Secondary | ICD-10-CM | POA: Diagnosis present

## 2021-09-08 DIAGNOSIS — E1110 Type 2 diabetes mellitus with ketoacidosis without coma: Principal | ICD-10-CM | POA: Diagnosis present

## 2021-09-08 DIAGNOSIS — Z91148 Patient's other noncompliance with medication regimen for other reason: Secondary | ICD-10-CM

## 2021-09-08 DIAGNOSIS — Z9114 Patient's other noncompliance with medication regimen: Secondary | ICD-10-CM

## 2021-09-08 DIAGNOSIS — N179 Acute kidney failure, unspecified: Secondary | ICD-10-CM | POA: Diagnosis present

## 2021-09-08 DIAGNOSIS — Z91199 Patient's noncompliance with other medical treatment and regimen due to unspecified reason: Secondary | ICD-10-CM

## 2021-09-08 DIAGNOSIS — Z833 Family history of diabetes mellitus: Secondary | ICD-10-CM

## 2021-09-08 DIAGNOSIS — E1010 Type 1 diabetes mellitus with ketoacidosis without coma: Secondary | ICD-10-CM

## 2021-09-08 DIAGNOSIS — Z91119 Patient's noncompliance with dietary regimen due to unspecified reason: Secondary | ICD-10-CM

## 2021-09-08 DIAGNOSIS — Z87891 Personal history of nicotine dependence: Secondary | ICD-10-CM

## 2021-09-08 DIAGNOSIS — T383X6A Underdosing of insulin and oral hypoglycemic [antidiabetic] drugs, initial encounter: Secondary | ICD-10-CM | POA: Diagnosis present

## 2021-09-08 DIAGNOSIS — Z794 Long term (current) use of insulin: Secondary | ICD-10-CM

## 2021-09-08 DIAGNOSIS — E1165 Type 2 diabetes mellitus with hyperglycemia: Secondary | ICD-10-CM

## 2021-09-08 LAB — BLOOD GAS, VENOUS
Acid-base deficit: 18.4 mmol/L — ABNORMAL HIGH (ref 0.0–2.0)
Bicarbonate: 10.5 mmol/L — ABNORMAL LOW (ref 20.0–28.0)
O2 Saturation: 37 %
Patient temperature: 98.6
pCO2, Ven: 34.5 mmHg — ABNORMAL LOW (ref 44.0–60.0)
pH, Ven: 7.11 — CL (ref 7.250–7.430)
pO2, Ven: <31 mmHg — CL (ref 32.0–45.0)

## 2021-09-08 LAB — CBC WITH DIFFERENTIAL/PLATELET
Abs Immature Granulocytes: 0.02 10*3/uL (ref 0.00–0.07)
Basophils Absolute: 0 10*3/uL (ref 0.0–0.1)
Basophils Relative: 1 %
Eosinophils Absolute: 0 10*3/uL (ref 0.0–0.5)
Eosinophils Relative: 0 %
HCT: 42.2 % (ref 39.0–52.0)
Hemoglobin: 13.6 g/dL (ref 13.0–17.0)
Immature Granulocytes: 0 %
Lymphocytes Relative: 10 %
Lymphs Abs: 0.8 10*3/uL (ref 0.7–4.0)
MCH: 35.2 pg — ABNORMAL HIGH (ref 26.0–34.0)
MCHC: 32.2 g/dL (ref 30.0–36.0)
MCV: 109.3 fL — ABNORMAL HIGH (ref 80.0–100.0)
Monocytes Absolute: 0.3 10*3/uL (ref 0.1–1.0)
Monocytes Relative: 3 %
Neutro Abs: 6.2 10*3/uL (ref 1.7–7.7)
Neutrophils Relative %: 86 %
Platelets: 361 10*3/uL (ref 150–400)
RBC: 3.86 MIL/uL — ABNORMAL LOW (ref 4.22–5.81)
RDW: 11.9 % (ref 11.5–15.5)
WBC: 7.3 10*3/uL (ref 4.0–10.5)
nRBC: 0 % (ref 0.0–0.2)

## 2021-09-08 LAB — CBG MONITORING, ED
Glucose-Capillary: 280 mg/dL — ABNORMAL HIGH (ref 70–99)
Glucose-Capillary: 331 mg/dL — ABNORMAL HIGH (ref 70–99)
Glucose-Capillary: 385 mg/dL — ABNORMAL HIGH (ref 70–99)
Glucose-Capillary: 389 mg/dL — ABNORMAL HIGH (ref 70–99)
Glucose-Capillary: 470 mg/dL — ABNORMAL HIGH (ref 70–99)

## 2021-09-08 LAB — URINALYSIS, ROUTINE W REFLEX MICROSCOPIC
Bacteria, UA: NONE SEEN
Bilirubin Urine: NEGATIVE
Glucose, UA: >=500 mg/dL — AB
Ketones, ur: 80 mg/dL — AB
Leukocytes,Ua: NEGATIVE
Nitrite: NEGATIVE
Protein, ur: 30 mg/dL — AB
Specific Gravity, Urine: 1.021 (ref 1.005–1.030)
pH: 5 (ref 5.0–8.0)

## 2021-09-08 LAB — COMPREHENSIVE METABOLIC PANEL
ALT: 17 U/L (ref 0–44)
AST: 16 U/L (ref 15–41)
Albumin: 4.5 g/dL (ref 3.5–5.0)
Alkaline Phosphatase: 113 U/L (ref 38–126)
Anion gap: >20 — ABNORMAL HIGH (ref 5–15)
BUN: 38 mg/dL — ABNORMAL HIGH (ref 6–20)
CO2: 10 mmol/L — ABNORMAL LOW (ref 22–32)
Calcium: 9.9 mg/dL (ref 8.9–10.3)
Chloride: 105 mmol/L (ref 98–111)
Creatinine, Ser: 2.04 mg/dL — ABNORMAL HIGH (ref 0.61–1.24)
GFR, Estimated: 38 mL/min — ABNORMAL LOW (ref >60)
Glucose, Bld: 456 mg/dL — ABNORMAL HIGH (ref 70–99)
Potassium: 5.2 mmol/L — ABNORMAL HIGH (ref 3.5–5.1)
Sodium: 142 mmol/L (ref 135–145)
Total Bilirubin: 2.2 mg/dL — ABNORMAL HIGH (ref 0.3–1.2)
Total Protein: 9.1 g/dL — ABNORMAL HIGH (ref 6.5–8.1)

## 2021-09-08 LAB — BETA-HYDROXYBUTYRIC ACID: Beta-Hydroxybutyric Acid: >8 mmol/L — ABNORMAL HIGH (ref 0.05–0.27)

## 2021-09-08 LAB — RESP PANEL BY RT-PCR (FLU A&B, COVID) ARPGX2
Influenza A by PCR: NEGATIVE
Influenza B by PCR: NEGATIVE
SARS Coronavirus 2 by RT PCR: NEGATIVE

## 2021-09-08 LAB — MAGNESIUM: Magnesium: 2.7 mg/dL — ABNORMAL HIGH (ref 1.7–2.4)

## 2021-09-08 MED ORDER — SODIUM CHLORIDE 0.9 % IV BOLUS
1000.0000 mL | Freq: Once | INTRAVENOUS | Status: AC
  Administered 2021-09-08: 1000 mL via INTRAVENOUS

## 2021-09-08 MED ORDER — DEXTROSE IN LACTATED RINGERS 5 % IV SOLN: INTRAVENOUS | Status: DC

## 2021-09-08 MED ORDER — LACTATED RINGERS IV SOLN: INTRAVENOUS | Status: DC

## 2021-09-08 MED ORDER — INSULIN REGULAR(HUMAN) IN NACL 100-0.9 UT/100ML-% IV SOLN
INTRAVENOUS | Status: DC
  Administered 2021-09-08: 11.5 [IU]/h via INTRAVENOUS
  Filled 2021-09-08: qty 100

## 2021-09-08 MED ORDER — ACETAMINOPHEN 650 MG RE SUPP: 650.0000 mg | Freq: Four times a day (QID) | RECTAL | Status: DC | PRN

## 2021-09-08 MED ORDER — INSULIN ASPART 100 UNIT/ML IV SOLN
10.0000 [IU] | Freq: Once | INTRAVENOUS | Status: AC
  Administered 2021-09-08: 10 [IU] via INTRAVENOUS
  Filled 2021-09-08: qty 0.1

## 2021-09-08 MED ORDER — ATORVASTATIN CALCIUM 10 MG PO TABS
20.0000 mg | ORAL_TABLET | Freq: Every day | ORAL | Status: DC
  Administered 2021-09-09 – 2021-09-10 (×2): 20 mg via ORAL
  Filled 2021-09-08 (×2): qty 2

## 2021-09-08 MED ORDER — DEXTROSE 50 % IV SOLN: 0.0000 mL | INTRAVENOUS | Status: DC | PRN

## 2021-09-08 MED ORDER — ACETAMINOPHEN 325 MG PO TABS: 650.0000 mg | ORAL_TABLET | Freq: Four times a day (QID) | ORAL | Status: DC | PRN

## 2021-09-08 NOTE — H&P (Signed)
History and Physical    PLEASE NOTE THAT DRAGON DICTATION SOFTWARE WAS USED IN THE CONSTRUCTION OF THIS NOTE.   Stanley Edwards [id number] DOB: 1965-10-23 DOA: 09/08/2021  PCP: Charlott Rakes, MD  Patient coming from: home   I have personally briefly reviewed patient's old medical records in [place]  Chief Complaint: Elevated blood sugar  HPI: Stanley Edwards is a 56 y.o. male with medical history significant for poorly controlled DM2 complicated by previous episodes of DKA who is admitted to [facility] on 09/08/2021 with DKA after presenting from home to [facility] ED complaining of elevated blood sugar.   The patient reports that he ran out of his home insulin regimen approximately 3 days ago, and consequently has been completely off of insulin over the interval 72 hours.  This is relative to his baseline insulin regimen which consists of 70/30 insulin 20 units subcu twice daily.  In the interval since running out of his insulin, the patient reports worsening polyuria/polydipsia as well as development of dry oral mucous membranes.  Over that timeframe he also has noted progressive increase in his blood sugars via serial home Accu-Cheks, ultimately noting his blood sugars today to be greater than 400, prompting him to present to [facility] emergency department for further evaluation and management thereof.  He confirms a prior history of DKA on at least 1 occasion, with chart review revealing DKA episode 5 years ago.  Denies any recent subjective fever, chills, rigors, or generalized myalgias. Denies any recent headache, neck stiffness, rhinitis, rhinorrhea, sore throat, sob, wheezing, cough.  Denies any recent diarrhea, rash, melena, or hematochezia.  She also denies any recent dysuria, gross hematuria.  No recent traveling or known COVID-19 exposures.  Denies any recent chest pain, diaphoresis, palpitations, dizziness.  Denies any recent alcohol consumption or use of recreational  drugs.  No recent nausea, vomiting, or abdominal pain.  Over the last 3 days, he reports decline in appetite, resulting in significant decline in oral intake of the timeframe.   Denies any associated acute focal neurologic deficits, including no acute focal weakness, acute focal numbness, paresthesias, facial droop, dysarthria, expressive aphasia, acute change in vision, acute change in hearing, dysphagia, vertigo.  Per chart review, most recent A1c noted to be 11.9% when checked on 07/17/2021.     ED Course:  Vital signs in the ED were notable for the following: Afebrile; initial heart rate 134, which is decreased to 112 following interval initiation of IV fluids, as further described low; initial blood pressure 107/75, with ensuing increase to 127/80 following IV fluids; respiratory rate 15-18, oxygen saturation 97 to 99% on room air.  Labs were notable for the following: CBG 470; VBG 7.11/34.5.  CMP notable for the following: Sodium 142, potassium 5.2, bicarbonate 10, anion gap greater than 20, BUN 38, creatinine 2.04 relative to most recent prior serum creatinine data point of 1.33 on 08/01/2021, glucose 456, and liver enzymes were found to be within normal limits.  CBC notable for white cell count 7300.  Beta hydroxybutyric acid has been ordered, with result currently pending.  Urinalysis also ordered, result currently pending.  COVID-19/influenza PCR pending.  Imaging and additional notable ED work-up: EKG shows sinus tachycardia with heart rate 120, normal intervals, and no evidence of T wave or ST changes, including no evidence of ST elevation.  While in the ED, the following were administered: Initiation of insulin drip; normal saline x2 L bolus followed by initiation of continuous LR at  125 cc/h.  Subsequently, the patient was admitted for overnight observation for further evaluation management of DKA complicated by acute kidney injury.    Review of Systems: As per HPI otherwise 10  point review of systems negative.   Past Medical History:  Diagnosis Date   Allergy    Diabetes mellitus without complication ([place])    Hypertension    Pt denies htn but it was on his previous hx    History reviewed. No pertinent surgical history.  Social History:  reports that he has quit smoking. His smoking use included cigarettes. He has never used smokeless tobacco. He reports current alcohol use of about 1.0 standard drink per week. He reports that he does not use drugs.   No Known Allergies  Family History  Problem Relation Age of Onset   Cancer Mother    Diabetes Father     Family history reviewed and not pertinent    Prior to Admission medications   Medication Sig Start Date End Date Taking? Authorizing Provider  atorvastatin (LIPITOR) 20 MG tablet Take 1 tablet (20 mg total) by mouth daily. 08/01/21  Yes Charlott Rakes, MD  insulin aspart protamine- aspart (NOVOLOG MIX 70/30) (70-30) 100 UNIT/ML injection Inject 0.2 mLs (20 Units total) into the skin 2 (two) times daily with a meal. 08/01/21  Yes Newlin, [name], MD  Blood Glucose Monitoring Suppl (TRUE METRIX METER) DEVI 1 each by Does not apply route 3 (three) times daily before meals. Patient not taking: Reported on 08/01/2021 05/30/20   Charlott Rakes, MD  glipiZIDE (GLUCOTROL) 10 MG tablet Take 1 tablet (10 mg total) by mouth 2 (two) times daily before a meal. Patient not taking: Reported on 09/08/2021 02/11/21   Charlott Rakes, MD  glucose blood (TRUE METRIX BLOOD GLUCOSE TEST) test strip Use 3 times daily before meals Patient not taking: Reported on 02/11/2021 05/30/20   Charlott Rakes, MD  Insulin Pen Needle 32G X 4 MM MISC 1 each by Does not apply route 2 (two) times daily. Patient not taking: Reported on 02/11/2021 03/24/19   Shelly Coss, MD     Objective    Physical Exam: Vitals:   09/08/21 1800 09/08/21 1815 09/08/21 1852 09/08/21 1930  BP: 101/76 101/78 (!) 119/98 127/80  Pulse: (!) 133 (!) 130 (!)  120 (!) 112  Resp:  18 15 15   Temp:      SpO2: 98% 97% 99% 99%    General: appears to be stated age; alert, oriented Skin: warm, dry, no rash Head:  AT/[place] Mouth:  Oral mucosa membranes appear dry, normal dentition Neck: supple; trachea midline Heart:  RRR; did not appreciate any M/R/G Lungs: CTAB, did not appreciate any wheezes, rales, or rhonchi Abdomen: + BS; soft, ND, NT Vascular: 2+ pedal pulses b/l; 2+ radial pulses b/l Extremities: no peripheral edema, no muscle wasting Neuro: strength and sensation intact in upper and lower extremities b/l    Labs on Admission: I have personally reviewed following labs and imaging studies  CBC: Recent Labs  Lab 09/08/21 1836  WBC 7.3  NEUTROABS 6.2  HGB 13.6  HCT 42.2  MCV 109.3*  PLT [id number]   Basic Metabolic Panel: Recent Labs  Lab 09/08/21 1836  NA 142  K 5.2*  CL 105  CO2 10*  GLUCOSE 456*  BUN 38*  CREATININE 2.04*  CALCIUM 9.9   GFR: CrCl cannot be calculated (Unknown ideal weight.). Liver Function Tests: Recent Labs  Lab 09/08/21 1836  AST 16  ALT 17  ALKPHOS 113  BILITOT 2.2*  PROT 9.1*  ALBUMIN 4.5   No results for input(s): LIPASE, AMYLASE in the last 168 hours. No results for input(s): AMMONIA in the last 168 hours. Coagulation Profile: No results for input(s): INR, PROTIME in the last 168 hours. Cardiac Enzymes: No results for input(s): CKTOTAL, CKMB, CKMBINDEX, TROPONINI in the last 168 hours. BNP (last 3 results) No results for input(s): PROBNP in the last 8760 hours. HbA1C: No results for input(s): HGBA1C in the last 72 hours. CBG: Recent Labs  Lab 09/08/21 1754 09/08/21 2003  GLUCAP 385* 470*   Lipid Profile: No results for input(s): CHOL, HDL, LDLCALC, TRIG, CHOLHDL, LDLDIRECT in the last 72 hours. Thyroid Function Tests: No results for input(s): TSH, T4TOTAL, FREET4, T3FREE, THYROIDAB in the last 72 hours. Anemia Panel: No results for input(s): VITAMINB12, FOLATE, FERRITIN, TIBC,  IRON, RETICCTPCT in the last 72 hours. Urine analysis:    Component Value Date/Time   COLORURINE YELLOW 08/31/2020 2053   APPEARANCEUR CLEAR 08/31/2020 2053   LABSPEC 1.032 (H) 08/31/2020 2053   PHURINE 5.0 08/31/2020 2053   GLUCOSEU >=500 (A) 08/31/2020 2053   HGBUR NEGATIVE 08/31/2020 2053   BILIRUBINUR NEGATIVE 08/31/2020 2053   BILIRUBINUR negative 01/23/2018 1016   KETONESUR NEGATIVE 08/31/2020 2053   PROTEINUR NEGATIVE 08/31/2020 2053   UROBILINOGEN 1.0 01/23/2018 1016   NITRITE NEGATIVE 08/31/2020 2053   LEUKOCYTESUR NEGATIVE 08/31/2020 2053    Radiological Exams on Admission: No results found.   EKG: Independently reviewed, with result as described above.    Assessment/Plan    Principal Problem:   DKA (diabetic ketoacidosis) ([place]) Active Problems:   AKI (acute kidney injury) ([place])   Dehydration     #) Diabetic ketoacidosis: In the setting of known history of poorly controlled type 1 diabetes with most recent A1c of 11.9% in December 2022, with prior hospitalizations for DKA, on home insulin in the form of 70/30 insulin 20 units subcu twice daily, dx of dka on basis of the following laboratory eval: presenting blood sugar of 470 a/w presenting blood gas consistent with metabolic acidosis,  while CMP demonstrates anion gap metabolic acidosis with serum bicarbonate of 10 and AG of greater than 20.  Of note, beta hydroxybutyric acid level has been ordered, with result currently pending. Additional presenting labs notable for corrected serum sodium of 142 and serum potassium of 5.2.  Suspect contribution to presenting DKA from suboptimal compliance with outpatient insulin regimen, with the patient noting that he completely ran out of his home insulin regimen 3 days ago, resulting in him being completely off of insulin over the ensuing 72 hours. No e/o to suggest underlying precipitating infxn at this time, although urinalysis, COVID-19/influenza PCR results currently  pending.  Additionally, we will pursue chest x-ray to further evaluate. Additionally, ACS is felt to be less likely at this time in setting of ekg  showing no e/o acute ischemic changes, no associated CP.   In the ED, insulin drip was initiated, and the following IVF administered: 2 L of NS bolus followed by initiation of continuous LR.   As most recent hemoglobin A1c occurred within 90 days, will refrain from repeating this value at the present time.  Additionally, given that the patient's inability to procure his outpatient insulin appears to have been significantly contributory to his presenting DKA, I will also place consult with case management to help with outpatient medication assistance.     Plan: DKA protocol initiated. Insulin drip per Endot[facility] protocol. Q1H cbg's.  Q4H BMP's in order to monitor ensuing AG, Na, and potassium. NPO.  Continuous LR for now. Once serum potassium is less than 5.0, will add KCl 10 mEq/L to existing IVF's. Once [facility] cbg's reflect serum glucose less than 250, will add D5 to existing IVF's. Check serum Mg and Phos levels, w/ prn supplementation per protocol. Once AG has closed and patient tolerating PO, will reinitiate basal insulin while continuing insulin drip for an additional two hours to prevent rebound hyperglycemia, before ultimately turning off the insulin drip. Until then, holding home insulin. Monitor on telemetry. Monitor strict I's & O's. An inpatient consult to diabetic educator has been placed. The importance of improved compliance with home insulin regimen was emphasized to patient.  Consult with case management for medication assistance .  Check UA, UDS. for completeness of DKA work-up, will also check CXR .      #) Acute kidney injury: Presenting creatinine 2.04 relative to most recent prior value of 1.33 in January of 2023.  Appears prerenal in nature in the setting of dehydration as a consequence of presenting DKA, including significant contribution  from glycemic gradient driven auto diuresis.  Urinalysis with microscopy has been ordered, with result currently pending.  Plan: Follow-up result urinalysis with microscopy.  Add on random urine sodium as well as random urine creatinine.  Monitor strict I's and O's and daily weights.  Tempt avoid nephrotoxic agents.  Further evaluation management of presumed DKA, as above, including IV fluids as outlined above.  Repeat CMP in the morning.       DVT prophylaxis: SCD's   Code Status: Full code Family Communication: none Disposition Plan: Per Rounding Team Consults called: none;  Admission status: Observation   PLEASE NOTE THAT DRAGON DICTATION SOFTWARE WAS USED IN THE CONSTRUCTION OF THIS NOTE.   [place] DO [facility] [place]   09/08/2021, 8:34 PM

## 2021-09-08 NOTE — ED Provider Triage Note (Signed)
Emergency Medicine Provider Triage Evaluation Note  Stanley Edwards , a 56 y.o. male  was evaluated in triage.  Pt complains of feeling lethargic, generally unwell.  States he thinks he is in "ketoacidosis".  Reports being very thirsty and urine decreased.  States that he takes insulin.  No fevers, cough, chest pain or abdominal pain.  Review of Systems  Positive: Decreased urination, weakness generalized Negative: Vomiting  Physical Exam  BP 107/75    Pulse (!) 134    Temp 97.9 F (36.6 C)    Resp 15    SpO2 98%  Gen:   Awake, no distress   Resp:  Normal effort  MSK:   Moves extremities without difficulty  Other:  Cracked lips, mildly dry oral mucosa, tachycardia  Medical Decision Making  Medically screening exam initiated at 6:07 PM.  Appropriate orders placed.  Stanley Edwards was informed that the remainder of the evaluation will be completed by another provider, this initial triage assessment does not replace that evaluation, and the importance of remaining in the ED until their evaluation is complete.     Carlisle Cater, PA-C 09/08/21 1809

## 2021-09-08 NOTE — ED Notes (Signed)
Pt. Made aware for the need of urine specimen. 

## 2021-09-08 NOTE — ED Provider Notes (Signed)
[facility]-EMERGENCY DEPT Provider Note   CSN: [id number] Arrival date & time: 09/08/21  1738     History  Chief Complaint  Patient presents with   Hyperglycemia    Stanley Edwards is a 56 y.o. male.  Patient presents with chief complaint of feeling dehydrated, increased urination, high blood sugars at home.  He states he ran out of insulin about 3 days ago and was trying to make it until his next doctor's visit.  However is feeling too dehydrated and presents to the ER denies fevers or cough denies vomiting or diarrhea.      Home Medications Prior to Admission medications   Medication Sig Start Date End Date Taking? Authorizing Provider  atorvastatin (LIPITOR) 20 MG tablet Take 1 tablet (20 mg total) by mouth daily. 08/01/21  Yes [name], [name], MD  Blood Glucose Monitoring Suppl (TRUE METRIX METER) DEVI 1 each by Does not apply route 3 (three) times daily before meals. Patient not taking: Reported on 08/01/2021 05/30/20   [name], [name], MD  glucose blood (TRUE METRIX BLOOD GLUCOSE TEST) test strip Use 3 times daily before meals Patient not taking: Reported on 02/11/2021 05/30/20   [name], [name], MD  insulin aspart protamine- aspart (NOVOLOG MIX 70/30) (70-30) 100 UNIT/ML injection Inject 0.2 mLs (20 Units total) into the skin 2 (two) times daily with a meal. 09/10/21 10/10/21  Jerald Kiefhiu, Stephen K, MD  Insulin Pen Needle 32G X 4 MM MISC 1 each by Does not apply route 2 (two) times daily. 09/10/21   Jerald Kiefhiu, Stephen K, MD      Allergies    Patient has no known allergies.    Review of Systems   Review of Systems  Constitutional:  Negative for fever.  HENT:  Negative for ear pain and sore throat.   Eyes:  Negative for pain.  Respiratory:  Negative for cough.   Cardiovascular:  Negative for chest pain.  Gastrointestinal:  Negative for abdominal pain.  Genitourinary:  Negative for flank pain.  Musculoskeletal:  Negative for back pain.  Skin:  Negative for color  change and rash.  Neurological:  Negative for syncope.  All other systems reviewed and are negative.  Physical Exam Updated Vital Signs BP 111/65    Pulse 92    Temp 98.1 F (36.7 C) (Oral)    Resp 16    SpO2 97%  Physical Exam Constitutional:      Appearance: He is well-developed.  HENT:     Head: Normocephalic.     Nose: Nose normal.  Eyes:     Extraocular Movements: Extraocular movements intact.  Cardiovascular:     Rate and Rhythm: Normal rate.  Pulmonary:     Effort: Pulmonary effort is normal.  Skin:    Coloration: Skin is not jaundiced.  Neurological:     Mental Status: He is alert. Mental status is at baseline.    ED Results / Procedures / Treatments   Labs (all labs ordered are listed, but only abnormal results are displayed) Labs Reviewed  BETA-HYDROXYBUTYRIC ACID - Abnormal; Notable for the following components:      Result Value   Beta-Hydroxybutyric Acid >8.00 (*)    All other components within normal limits  BETA-HYDROXYBUTYRIC ACID - Abnormal; Notable for the following components:   Beta-Hydroxybutyric Acid 5.01 (*)    All other components within normal limits  CBC WITH DIFFERENTIAL/PLATELET - Abnormal; Notable for the following components:   RBC 3.86 (*)    MCV 109.3 (*)  MCH 35.2 (*)    All other components within normal limits  URINALYSIS, ROUTINE W REFLEX MICROSCOPIC - Abnormal; Notable for the following components:   Color, Urine STRAW (*)    Glucose, UA >=500 (*)    Hgb urine dipstick MODERATE (*)    Ketones, ur 80 (*)    Protein, ur 30 (*)    All other components within normal limits  BLOOD GAS, VENOUS - Abnormal; Notable for the following components:   pH, Ven 7.110 (*)    pCO2, Ven 34.5 (*)    pO2, Ven <31.0 (*)    Bicarbonate 10.5 (*)    Acid-base deficit 18.4 (*)    All other components within normal limits  COMPREHENSIVE METABOLIC PANEL - Abnormal; Notable for the following components:   Potassium 5.2 (*)    CO2 10 (*)     Glucose, Bld 456 (*)    BUN 38 (*)    Creatinine, Ser 2.04 (*)    Total Protein 9.1 (*)    Total Bilirubin 2.2 (*)    GFR, Estimated 38 (*)    Anion gap >20 (*)    All other components within normal limits  PHOSPHORUS - Abnormal; Notable for the following components:   Phosphorus 1.3 (*)    All other components within normal limits  MAGNESIUM - Abnormal; Notable for the following components:   Magnesium 2.7 (*)    All other components within normal limits  COMPREHENSIVE METABOLIC PANEL - Abnormal; Notable for the following components:   CO2 17 (*)    Glucose, Bld 175 (*)    BUN 35 (*)    Creatinine, Ser 1.31 (*)    Total Protein 8.9 (*)    Total Bilirubin 1.7 (*)    Anion gap 16 (*)    All other components within normal limits  CBC WITH DIFFERENTIAL/PLATELET - Abnormal; Notable for the following components:   RBC 4.17 (*)    MCV 105.5 (*)    MCH 35.3 (*)    All other components within normal limits  BASIC METABOLIC PANEL - Abnormal; Notable for the following components:   Chloride 113 (*)    CO2 16 (*)    Glucose, Bld 225 (*)    BUN 30 (*)    Creatinine, Ser 1.45 (*)    Calcium 8.4 (*)    GFR, Estimated 57 (*)    All other components within normal limits  BETA-HYDROXYBUTYRIC ACID - Abnormal; Notable for the following components:   Beta-Hydroxybutyric Acid 6.54 (*)    All other components within normal limits  GLUCOSE, CAPILLARY - Abnormal; Notable for the following components:   Glucose-Capillary 155 (*)    All other components within normal limits  GLUCOSE, CAPILLARY - Abnormal; Notable for the following components:   Glucose-Capillary 209 (*)    All other components within normal limits  GLUCOSE, CAPILLARY - Abnormal; Notable for the following components:   Glucose-Capillary 168 (*)    All other components within normal limits  GLUCOSE, CAPILLARY - Abnormal; Notable for the following components:   Glucose-Capillary 230 (*)    All other components within normal  limits  GLUCOSE, CAPILLARY - Abnormal; Notable for the following components:   Glucose-Capillary 230 (*)    All other components within normal limits  BASIC METABOLIC PANEL - Abnormal; Notable for the following components:   CO2 21 (*)    Glucose, Bld 188 (*)    BUN 33 (*)    Creatinine, Ser 1.44 (*)  GFR, Estimated 57 (*)    All other components within normal limits  BASIC METABOLIC PANEL - Abnormal; Notable for the following components:   Chloride 112 (*)    Glucose, Bld 141 (*)    BUN 31 (*)    Creatinine, Ser 1.30 (*)    All other components within normal limits  BASIC METABOLIC PANEL - Abnormal; Notable for the following components:   Glucose, Bld 130 (*)    BUN 29 (*)    All other components within normal limits  BETA-HYDROXYBUTYRIC ACID - Abnormal; Notable for the following components:   Beta-Hydroxybutyric Acid 2.70 (*)    All other components within normal limits  GLUCOSE, CAPILLARY - Abnormal; Notable for the following components:   Glucose-Capillary 198 (*)    All other components within normal limits  GLUCOSE, CAPILLARY - Abnormal; Notable for the following components:   Glucose-Capillary 176 (*)    All other components within normal limits  BASIC METABOLIC PANEL - Abnormal; Notable for the following components:   CO2 21 (*)    Glucose, Bld 201 (*)    BUN 27 (*)    Creatinine, Ser 1.27 (*)    Calcium 8.7 (*)    All other components within normal limits  GLUCOSE, CAPILLARY - Abnormal; Notable for the following components:   Glucose-Capillary 173 (*)    All other components within normal limits  GLUCOSE, CAPILLARY - Abnormal; Notable for the following components:   Glucose-Capillary 153 (*)    All other components within normal limits  GLUCOSE, CAPILLARY - Abnormal; Notable for the following components:   Glucose-Capillary 150 (*)    All other components within normal limits  COMPREHENSIVE METABOLIC PANEL - Abnormal; Notable for the following components:    Sodium 134 (*)    CO2 21 (*)    Glucose, Bld 170 (*)    BUN 25 (*)    Calcium 8.7 (*)    Total Protein 6.3 (*)    Albumin 3.3 (*)    Total Bilirubin 1.3 (*)    All other components within normal limits  CBC - Abnormal; Notable for the following components:   RBC 2.61 (*)    Hemoglobin 9.5 (*)    HCT 26.8 (*)    MCV 102.7 (*)    MCH 36.4 (*)    All other components within normal limits  GLUCOSE, CAPILLARY - Abnormal; Notable for the following components:   Glucose-Capillary 151 (*)    All other components within normal limits  GLUCOSE, CAPILLARY - Abnormal; Notable for the following components:   Glucose-Capillary 132 (*)    All other components within normal limits  GLUCOSE, CAPILLARY - Abnormal; Notable for the following components:   Glucose-Capillary 144 (*)    All other components within normal limits  GLUCOSE, CAPILLARY - Abnormal; Notable for the following components:   Glucose-Capillary 160 (*)    All other components within normal limits  GLUCOSE, CAPILLARY - Abnormal; Notable for the following components:   Glucose-Capillary 173 (*)    All other components within normal limits  CBG MONITORING, ED - Abnormal; Notable for the following components:   Glucose-Capillary 385 (*)    All other components within normal limits  CBG MONITORING, ED - Abnormal; Notable for the following components:   Glucose-Capillary 470 (*)    All other components within normal limits  CBG MONITORING, ED - Abnormal; Notable for the following components:   Glucose-Capillary 389 (*)    All other components within normal limits  CBG MONITORING, ED - Abnormal; Notable for the following components:   Glucose-Capillary 331 (*)    All other components within normal limits  CBG MONITORING, ED - Abnormal; Notable for the following components:   Glucose-Capillary 280 (*)    All other components within normal limits  CBG MONITORING, ED - Abnormal; Notable for the following components:    Glucose-Capillary 233 (*)    All other components within normal limits  CBG MONITORING, ED - Abnormal; Notable for the following components:   Glucose-Capillary 212 (*)    All other components within normal limits  CBG MONITORING, ED - Abnormal; Notable for the following components:   Glucose-Capillary 173 (*)    All other components within normal limits  CBG MONITORING, ED - Abnormal; Notable for the following components:   Glucose-Capillary 176 (*)    All other components within normal limits  CBG MONITORING, ED - Abnormal; Notable for the following components:   Glucose-Capillary 151 (*)    All other components within normal limits  RESP PANEL BY RT-PCR (FLU A&B, COVID) ARPGX2  MRSA NEXT GEN BY PCR, NASAL  MAGNESIUM  HIV ANTIBODY (ROUTINE TESTING W REFLEX)  RAPID URINE DRUG SCREEN, HOSP PERFORMED  SODIUM, URINE, RANDOM  CREATININE, URINE, RANDOM    EKG EKG Interpretation  Date/Time:  Sunday September 08 2021 18:52:32 EST Ventricular Rate:  120 PR Interval:  137 QRS Duration: 83 QT Interval:  320 QTC Calculation: 453 R Axis:   87 Text Interpretation: Sinus tachycardia Probable LVH with secondary repol abnrm Confirmed by Norman Clay ([id number]) on 09/08/2021 7:21:31 PM  Radiology No results found.  Procedures .Critical Care Performed by: Cheryll Cockayne, MD Authorized by: Cheryll Cockayne, MD   Critical care provider statement:    Critical care time (minutes):  40   Critical care time was exclusive of:  Separately billable procedures and treating other patients and teaching time   Critical care was necessary to treat or prevent imminent or life-threatening deterioration of the following conditions:  Metabolic crisis Comments:     DKA     Medications Ordered in ED Medications  sodium chloride 0.9 % bolus 1,000 mL (0 mLs Intravenous Stopped 09/08/21 2239)  sodium chloride 0.9 % bolus 1,000 mL (0 mLs Intravenous Stopped 09/08/21 2239)  insulin aspart (novoLOG) injection 10  Units (10 Units Intravenous Given 09/08/21 2152)    ED Course/ Medical Decision Making/ A&P                           Medical Decision Making Amount and/or Complexity of Data Reviewed Labs: ordered.  Risk OTC drugs. Prescription drug management. Decision regarding hospitalization.   Review of records shows office visit August 01, 2021.  Discharged home.  Patient has an elevated anion gap with low bicarb.  Given 2 L bolus of fluids.  IV insulin ordered as well.  Patient admitted to the hospitalist team for DKA.         Final Clinical Impression(s) / ED Diagnoses Final diagnoses:  Diabetic ketoacidosis without coma associated with type 1 diabetes mellitus (HCC)    Rx / DC Orders ED Discharge Orders          Ordered    insulin aspart protamine- aspart (NOVOLOG MIX 70/30) (70-30) 100 UNIT/ML injection  2 times daily with meals       Note to Pharmacy: Dose increase   09/10/21 1227    Insulin Pen Needle 32G X 4 MM  MISC  2 times daily        09/10/21 1227    Amb Referral to Nutrition and Diabetic Education        09/10/21 1348              Cheryll Cockayne, MD 09/11/21 [phone]

## 2021-09-08 NOTE — ED Notes (Signed)
Per Dr. Audley Hose, he wants to get both NS bolus in before starting the insulin.

## 2021-09-08 NOTE — ED Triage Notes (Addendum)
Per EMS, patient from home where he lives alone, difficulty managing medications. Not checking blood sugars and administering insulin only once daily. C/o weakness and increased thirst x3 days. A&Ox4.  CBG 540 BP 118/60  20g L AC  346ml NS with EMS

## 2021-09-09 DIAGNOSIS — Z87891 Personal history of nicotine dependence: Secondary | ICD-10-CM | POA: Diagnosis not present

## 2021-09-09 DIAGNOSIS — E1110 Type 2 diabetes mellitus with ketoacidosis without coma: Secondary | ICD-10-CM | POA: Diagnosis present

## 2021-09-09 DIAGNOSIS — T383X6A Underdosing of insulin and oral hypoglycemic [antidiabetic] drugs, initial encounter: Secondary | ICD-10-CM | POA: Diagnosis present

## 2021-09-09 DIAGNOSIS — Z833 Family history of diabetes mellitus: Secondary | ICD-10-CM | POA: Diagnosis not present

## 2021-09-09 DIAGNOSIS — E860 Dehydration: Secondary | ICD-10-CM | POA: Diagnosis present

## 2021-09-09 DIAGNOSIS — Z9114 Patient's other noncompliance with medication regimen: Secondary | ICD-10-CM | POA: Diagnosis not present

## 2021-09-09 DIAGNOSIS — Z79899 Other long term (current) drug therapy: Secondary | ICD-10-CM | POA: Diagnosis not present

## 2021-09-09 DIAGNOSIS — N179 Acute kidney failure, unspecified: Secondary | ICD-10-CM | POA: Diagnosis present

## 2021-09-09 DIAGNOSIS — Z20822 Contact with and (suspected) exposure to covid-19: Secondary | ICD-10-CM | POA: Diagnosis present

## 2021-09-09 DIAGNOSIS — Z91119 Patient's noncompliance with dietary regimen due to unspecified reason: Secondary | ICD-10-CM | POA: Diagnosis not present

## 2021-09-09 DIAGNOSIS — Z794 Long term (current) use of insulin: Secondary | ICD-10-CM | POA: Diagnosis not present

## 2021-09-09 LAB — BASIC METABOLIC PANEL
Anion gap: 12 (ref 5–15)
Anion gap: 9 (ref 5–15)
Anion gap: 5 (ref 5–15)
Anion gap: 7 (ref 5–15)
BUN: 30 mg/dL — ABNORMAL HIGH (ref 6–20)
BUN: 33 mg/dL — ABNORMAL HIGH (ref 6–20)
BUN: 31 mg/dL — ABNORMAL HIGH (ref 6–20)
BUN: 29 mg/dL — ABNORMAL HIGH (ref 6–20)
CO2: 16 mmol/L — ABNORMAL LOW (ref 22–32)
CO2: 21 mmol/L — ABNORMAL LOW (ref 22–32)
CO2: 23 mmol/L (ref 22–32)
CO2: 22 mmol/L (ref 22–32)
Calcium: 8.4 mg/dL — ABNORMAL LOW (ref 8.9–10.3)
Calcium: 9.3 mg/dL (ref 8.9–10.3)
Calcium: 9.1 mg/dL (ref 8.9–10.3)
Calcium: 9 mg/dL (ref 8.9–10.3)
Chloride: 113 mmol/L — ABNORMAL HIGH (ref 98–111)
Chloride: 111 mmol/L (ref 98–111)
Chloride: 112 mmol/L — ABNORMAL HIGH (ref 98–111)
Chloride: 110 mmol/L (ref 98–111)
Creatinine, Ser: 1.45 mg/dL — ABNORMAL HIGH (ref 0.61–1.24)
Creatinine, Ser: 1.44 mg/dL — ABNORMAL HIGH (ref 0.61–1.24)
Creatinine, Ser: 1.3 mg/dL — ABNORMAL HIGH (ref 0.61–1.24)
Creatinine, Ser: 1.23 mg/dL (ref 0.61–1.24)
GFR, Estimated: 57 mL/min — ABNORMAL LOW (ref >60)
GFR, Estimated: 57 mL/min — ABNORMAL LOW (ref >60)
GFR, Estimated: >60 mL/min (ref >60)
GFR, Estimated: >60 mL/min (ref >60)
Glucose, Bld: 225 mg/dL — ABNORMAL HIGH (ref 70–99)
Glucose, Bld: 188 mg/dL — ABNORMAL HIGH (ref 70–99)
Glucose, Bld: 141 mg/dL — ABNORMAL HIGH (ref 70–99)
Glucose, Bld: 130 mg/dL — ABNORMAL HIGH (ref 70–99)
Potassium: 3.5 mmol/L (ref 3.5–5.1)
Potassium: 3.6 mmol/L (ref 3.5–5.1)
Potassium: 3.5 mmol/L (ref 3.5–5.1)
Potassium: 3.5 mmol/L (ref 3.5–5.1)
Sodium: 141 mmol/L (ref 135–145)
Sodium: 141 mmol/L (ref 135–145)
Sodium: 140 mmol/L (ref 135–145)
Sodium: 139 mmol/L (ref 135–145)

## 2021-09-09 LAB — CBC WITH DIFFERENTIAL/PLATELET
Abs Immature Granulocytes: 0.04 10*3/uL (ref 0.00–0.07)
Basophils Absolute: 0 10*3/uL (ref 0.0–0.1)
Basophils Relative: 1 %
Eosinophils Absolute: 0 10*3/uL (ref 0.0–0.5)
Eosinophils Relative: 0 %
HCT: 44 % (ref 39.0–52.0)
Hemoglobin: 14.7 g/dL (ref 13.0–17.0)
Immature Granulocytes: 1 %
Lymphocytes Relative: 13 %
Lymphs Abs: 1 10*3/uL (ref 0.7–4.0)
MCH: 35.3 pg — ABNORMAL HIGH (ref 26.0–34.0)
MCHC: 33.4 g/dL (ref 30.0–36.0)
MCV: 105.5 fL — ABNORMAL HIGH (ref 80.0–100.0)
Monocytes Absolute: 0.5 10*3/uL (ref 0.1–1.0)
Monocytes Relative: 6 %
Neutro Abs: 6.5 10*3/uL (ref 1.7–7.7)
Neutrophils Relative %: 79 %
Platelets: 299 10*3/uL (ref 150–400)
RBC: 4.17 MIL/uL — ABNORMAL LOW (ref 4.22–5.81)
RDW: 11.7 % (ref 11.5–15.5)
WBC: 8 10*3/uL (ref 4.0–10.5)
nRBC: 0 % (ref 0.0–0.2)

## 2021-09-09 LAB — COMPREHENSIVE METABOLIC PANEL WITH GFR
ALT: 16 U/L (ref 0–44)
AST: 15 U/L (ref 15–41)
Albumin: 4.5 g/dL (ref 3.5–5.0)
Alkaline Phosphatase: 101 U/L (ref 38–126)
Anion gap: 16 — ABNORMAL HIGH (ref 5–15)
BUN: 35 mg/dL — ABNORMAL HIGH (ref 6–20)
CO2: 17 mmol/L — ABNORMAL LOW (ref 22–32)
Calcium: 9.6 mg/dL (ref 8.9–10.3)
Chloride: 109 mmol/L (ref 98–111)
Creatinine, Ser: 1.31 mg/dL — ABNORMAL HIGH (ref 0.61–1.24)
GFR, Estimated: >60 mL/min
Glucose, Bld: 175 mg/dL — ABNORMAL HIGH (ref 70–99)
Potassium: 3.6 mmol/L (ref 3.5–5.1)
Sodium: 142 mmol/L (ref 135–145)
Total Bilirubin: 1.7 mg/dL — ABNORMAL HIGH (ref 0.3–1.2)
Total Protein: 8.9 g/dL — ABNORMAL HIGH (ref 6.5–8.1)

## 2021-09-09 LAB — CREATININE, URINE, RANDOM: Creatinine, Urine: 43.1 mg/dL

## 2021-09-09 LAB — GLUCOSE, CAPILLARY
Glucose-Capillary: 155 mg/dL — ABNORMAL HIGH (ref 70–99)
Glucose-Capillary: 209 mg/dL — ABNORMAL HIGH (ref 70–99)
Glucose-Capillary: 168 mg/dL — ABNORMAL HIGH (ref 70–99)
Glucose-Capillary: 230 mg/dL — ABNORMAL HIGH (ref 70–99)
Glucose-Capillary: 198 mg/dL — ABNORMAL HIGH (ref 70–99)
Glucose-Capillary: 230 mg/dL — ABNORMAL HIGH (ref 70–99)
Glucose-Capillary: 176 mg/dL — ABNORMAL HIGH (ref 70–99)
Glucose-Capillary: 173 mg/dL — ABNORMAL HIGH (ref 70–99)
Glucose-Capillary: 153 mg/dL — ABNORMAL HIGH (ref 70–99)
Glucose-Capillary: 150 mg/dL — ABNORMAL HIGH (ref 70–99)
Glucose-Capillary: 151 mg/dL — ABNORMAL HIGH (ref 70–99)
Glucose-Capillary: 132 mg/dL — ABNORMAL HIGH (ref 70–99)
Glucose-Capillary: 144 mg/dL — ABNORMAL HIGH (ref 70–99)

## 2021-09-09 LAB — MAGNESIUM: Magnesium: 2.4 mg/dL (ref 1.7–2.4)

## 2021-09-09 LAB — PHOSPHORUS: Phosphorus: 1.3 mg/dL — ABNORMAL LOW (ref 2.5–4.6)

## 2021-09-09 LAB — RAPID URINE DRUG SCREEN, HOSP PERFORMED
Amphetamines: NOT DETECTED
Barbiturates: NOT DETECTED
Benzodiazepines: NOT DETECTED
Cocaine: NOT DETECTED
Opiates: NOT DETECTED
Tetrahydrocannabinol: NOT DETECTED

## 2021-09-09 LAB — CBG MONITORING, ED
Glucose-Capillary: 151 mg/dL — ABNORMAL HIGH (ref 70–99)
Glucose-Capillary: 173 mg/dL — ABNORMAL HIGH (ref 70–99)
Glucose-Capillary: 176 mg/dL — ABNORMAL HIGH (ref 70–99)
Glucose-Capillary: 212 mg/dL — ABNORMAL HIGH (ref 70–99)
Glucose-Capillary: 233 mg/dL — ABNORMAL HIGH (ref 70–99)

## 2021-09-09 LAB — BETA-HYDROXYBUTYRIC ACID
Beta-Hydroxybutyric Acid: 5.01 mmol/L — ABNORMAL HIGH (ref 0.05–0.27)
Beta-Hydroxybutyric Acid: 6.54 mmol/L — ABNORMAL HIGH (ref 0.05–0.27)
Beta-Hydroxybutyric Acid: 2.7 mmol/L — ABNORMAL HIGH (ref 0.05–0.27)

## 2021-09-09 LAB — SODIUM, URINE, RANDOM: Sodium, Ur: 47 mmol/L

## 2021-09-09 LAB — MRSA NEXT GEN BY PCR, NASAL: MRSA by PCR Next Gen: NOT DETECTED

## 2021-09-09 LAB — HIV ANTIBODY (ROUTINE TESTING W REFLEX): HIV Screen 4th Generation wRfx: NONREACTIVE

## 2021-09-09 MED ORDER — INSULIN ASPART 100 UNIT/ML IJ SOLN: 0.0000 [IU] | Freq: Every day | INTRAMUSCULAR | Status: DC

## 2021-09-09 MED ORDER — INSULIN ASPART 100 UNIT/ML IJ SOLN
0.0000 [IU] | Freq: Three times a day (TID) | INTRAMUSCULAR | Status: DC
  Administered 2021-09-09 – 2021-09-10 (×3): 2 [IU] via SUBCUTANEOUS

## 2021-09-09 MED ORDER — INSULIN GLARGINE-YFGN 100 UNIT/ML ~~LOC~~ SOLN
24.0000 [IU] | Freq: Every day | SUBCUTANEOUS | Status: DC
  Administered 2021-09-09: 24 [IU] via SUBCUTANEOUS
  Filled 2021-09-09 (×2): qty 0.24

## 2021-09-09 MED ORDER — KCL IN DEXTROSE-NACL 10-5-0.45 MEQ/L-%-% IV SOLN
INTRAVENOUS | Status: DC
  Filled 2021-09-09 (×3): qty 1000

## 2021-09-09 MED ORDER — DEXTROSE-NACL 5-0.45 % IV SOLN: INTRAVENOUS | Status: DC

## 2021-09-09 MED ORDER — INSULIN GLARGINE-YFGN 100 UNIT/ML ~~LOC~~ SOLN
20.0000 [IU] | Freq: Every day | SUBCUTANEOUS | Status: DC
  Filled 2021-09-09: qty 0.2

## 2021-09-09 MED ORDER — CHLORHEXIDINE GLUCONATE CLOTH 2 % EX PADS: 6.0000 | MEDICATED_PAD | Freq: Every day | CUTANEOUS | Status: DC

## 2021-09-09 NOTE — Assessment & Plan Note (Signed)
-  Improved with IVF hydration

## 2021-09-09 NOTE — Progress Notes (Signed)
°  Progress Note   Patient: Stanley Edwards OIN:[id number] DOB: [birth year] DOA: 09/08/2021     0 DOS: the patient was seen and examined on 09/09/2021   Brief hospital course: 56 y.o. male with medical history significant for poorly controlled DM2 complicated by previous episodes of DKA who is admitted to [facility] on 09/08/2021 with DKA after presenting from home to [facility] ED complaining of elevated blood sugar after running out of insulin. Pt admitted for DKA  Assessment and Plan: * DKA (diabetic ketoacidosis) (HCC)- (present on admission) -Improved with IVF and insulin gtt -Have transitioned to subq lantus -Diabetic coordinator following. -Anticipate continuing 70/30 at time of d/c  Dehydration- (present on admission) -Improved with IVF hydration  AKI (acute kidney injury) (HCC)- (present on admission) -Likely secondary to presenting DKA -Improved with fluids -Repeat bmet in AM       Subjective: Feeling weak Thirsty  Physical Exam: Vitals:   09/09/21 0615 09/09/21 0746 09/09/21 0900 09/09/21 1200  BP: 106/66  93/61   Pulse: 94  98   Resp: 14  20   Temp: 98.4 F (36.9 C) 97.9 F (36.6 C)  98.3 F (36.8 C)  TempSrc: Oral Oral  Axillary  SpO2: 100%  99%    General exam: Awake, laying in bed, in nad Respiratory system: Normal respiratory effort, no wheezing Cardiovascular system: regular rate, s1, s2 Gastrointestinal system: Soft, nondistended, positive BS Central nervous system: CN2-12 grossly intact, strength intact Extremities: Perfused, no clubbing Skin: Normal skin turgor, no notable skin lesions seen Psychiatry: Mood normal // no visual hallucinations   Data Reviewed:  Labs reviewed. Cr improving. GAP now closed  Family Communication: Pt in room, family not at bedside  Disposition: Status is: Observation The patient remains OBS appropriate and will d/c before 2 midnights.        Planned Discharge Destination: Home      Author: Rickey Barbara, MD 09/09/2021 1:16 PM  For on call review [email or web address].

## 2021-09-09 NOTE — Assessment & Plan Note (Addendum)
-  Likely secondary to presenting DKA -Improved with fluids

## 2021-09-09 NOTE — Progress Notes (Signed)
Admitted to ICU 1240 from ED. Alert to person/place/time, lethargic and delayed responses. Insulin gtt @ 2.2 units/hr infusing and IVF dextrose 0.45% NaCL w/ 10 Meq KCL infusing @ 125 cc/hr. Both Ivs are infiltrated and will not flush/painful. IV team consult placed. CBG-155. VS WNL. On RA. Denies pain, dizziness, shortness of breath and nausea. Call light in reach. Continuing to monitor.

## 2021-09-09 NOTE — Assessment & Plan Note (Addendum)
-  Improved with IVF and insulin gtt -Have transitioned to subq lantus -Diabetic coordinator following. -Pt has admitted noncompliance with diet. Pt given diet education -Resumed 70/30 at time of d/c

## 2021-09-09 NOTE — Plan of Care (Signed)
  Problem: Education: Goal: Knowledge of General Education information will improve Description: Including pain rating scale, medication(s)/side effects and non-pharmacologic comfort measures Outcome: Progressing   Problem: Health Behavior/Discharge Planning: Goal: Ability to manage health-related needs will improve Outcome: Progressing   Problem: Clinical Measurements: Goal: Ability to maintain clinical measurements within normal limits will improve Outcome: Progressing   Problem: Clinical Measurements: Goal: Will remain free from infection Outcome: Progressing   Problem: Clinical Measurements: Goal: Diagnostic test results will improve Outcome: Progressing   Problem: Clinical Measurements: Goal: Respiratory complications will improve Outcome: Progressing   Problem: Clinical Measurements: Goal: Cardiovascular complication will be avoided Outcome: Progressing   Problem: Activity: Goal: Risk for activity intolerance will decrease Outcome: Progressing   Problem: Nutrition: Goal: Adequate nutrition will be maintained Outcome: Progressing   

## 2021-09-09 NOTE — Progress Notes (Addendum)
Inpatient Diabetes Program Recommendations  AACE/ADA: New Consensus Statement on Inpatient Glycemic Control (2015)  Target Ranges:  Prepandial:   less than 140 mg/dL      Peak postprandial:   less than 180 mg/dL (1-2 hours)      Critically ill patients:  140 - 180 mg/dL   Lab Results  Component Value Date   GLUCAP 168 (H) 09/09/2021   HGBA1C 11.9 (A) 07/17/2021    Review of Glycemic Control  Latest Reference Range & Units 09/09/21 06:04 09/09/21 07:25 09/09/21 07:38  Glucose-Capillary 70 - 99 mg/dL 657 (H) 846 (H) 962 (H)  (H): Data is abnormally high Diabetes history: Type 2 DM Outpatient Diabetes medications: Novolog 70/30 20 units BID Current orders for Inpatient glycemic control: IV insulin   Inpatient Diabetes Program Recommendations:    Pending BMET. Continue IV insulin at this time.   Addendum: Spoke with patient briefly regarding outpatient diabetes management. Patient verifies he takes 70/30 and obtain the prescription from [facility]. He asked me to call his sister for additional information. Attempted without answer and unable to leave voicemail.  Will follow back up with patient when more appropriate.   Thanks, Lujean Rave, MSN, RNC-OB Diabetes Coordinator [phone] (8a-5p)

## 2021-09-10 DIAGNOSIS — Z91199 Patient's noncompliance with other medical treatment and regimen due to unspecified reason: Secondary | ICD-10-CM

## 2021-09-10 LAB — CBC
HCT: 26.8 % — ABNORMAL LOW (ref 39.0–52.0)
Hemoglobin: 9.5 g/dL — ABNORMAL LOW (ref 13.0–17.0)
MCH: 36.4 pg — ABNORMAL HIGH (ref 26.0–34.0)
MCHC: 35.4 g/dL (ref 30.0–36.0)
MCV: 102.7 fL — ABNORMAL HIGH (ref 80.0–100.0)
Platelets: 241 10*3/uL (ref 150–400)
RBC: 2.61 MIL/uL — ABNORMAL LOW (ref 4.22–5.81)
RDW: 11.5 % (ref 11.5–15.5)
WBC: 7.3 10*3/uL (ref 4.0–10.5)
nRBC: 0 % (ref 0.0–0.2)

## 2021-09-10 LAB — BASIC METABOLIC PANEL
Anion gap: 10 (ref 5–15)
BUN: 27 mg/dL — ABNORMAL HIGH (ref 6–20)
CO2: 21 mmol/L — ABNORMAL LOW (ref 22–32)
Calcium: 8.7 mg/dL — ABNORMAL LOW (ref 8.9–10.3)
Chloride: 104 mmol/L (ref 98–111)
Creatinine, Ser: 1.27 mg/dL — ABNORMAL HIGH (ref 0.61–1.24)
GFR, Estimated: >60 mL/min (ref >60)
Glucose, Bld: 201 mg/dL — ABNORMAL HIGH (ref 70–99)
Potassium: 3.5 mmol/L (ref 3.5–5.1)
Sodium: 135 mmol/L (ref 135–145)

## 2021-09-10 LAB — COMPREHENSIVE METABOLIC PANEL
ALT: 14 U/L (ref 0–44)
AST: 16 U/L (ref 15–41)
Albumin: 3.3 g/dL — ABNORMAL LOW (ref 3.5–5.0)
Alkaline Phosphatase: 78 U/L (ref 38–126)
Anion gap: 8 (ref 5–15)
BUN: 25 mg/dL — ABNORMAL HIGH (ref 6–20)
CO2: 21 mmol/L — ABNORMAL LOW (ref 22–32)
Calcium: 8.7 mg/dL — ABNORMAL LOW (ref 8.9–10.3)
Chloride: 105 mmol/L (ref 98–111)
Creatinine, Ser: 1.16 mg/dL (ref 0.61–1.24)
GFR, Estimated: >60 mL/min (ref >60)
Glucose, Bld: 170 mg/dL — ABNORMAL HIGH (ref 70–99)
Potassium: 3.5 mmol/L (ref 3.5–5.1)
Sodium: 134 mmol/L — ABNORMAL LOW (ref 135–145)
Total Bilirubin: 1.3 mg/dL — ABNORMAL HIGH (ref 0.3–1.2)
Total Protein: 6.3 g/dL — ABNORMAL LOW (ref 6.5–8.1)

## 2021-09-10 LAB — GLUCOSE, CAPILLARY
Glucose-Capillary: 160 mg/dL — ABNORMAL HIGH (ref 70–99)
Glucose-Capillary: 173 mg/dL — ABNORMAL HIGH (ref 70–99)

## 2021-09-10 MED ORDER — INSULIN ASPART PROT & ASPART (70-30 MIX) 100 UNIT/ML ~~LOC~~ SUSP
20.0000 [IU] | Freq: Two times a day (BID) | SUBCUTANEOUS | 0 refills | Status: DC
  Filled 2021-09-10 (×2): qty 12, 30d supply, fill #0
  Filled 2021-09-11: qty 10, 25d supply, fill #0

## 2021-09-10 MED ORDER — INSULIN ASPART PROT & ASPART (70-30 MIX) 100 UNIT/ML ~~LOC~~ SUSP
20.0000 [IU] | Freq: Two times a day (BID) | SUBCUTANEOUS | Status: DC
  Administered 2021-09-10: 20 [IU] via SUBCUTANEOUS
  Filled 2021-09-10: qty 10

## 2021-09-10 MED ORDER — INSULIN PEN NEEDLE 32G X 4 MM MISC
1.0000 | Freq: Two times a day (BID) | 0 refills | Status: DC
  Filled 2021-09-10: qty 100, fill #0
  Filled 2021-09-10: qty 100, 50d supply, fill #0

## 2021-09-10 NOTE — Progress Notes (Signed)
Inpatient Diabetes Program Recommendations  AACE/ADA: New Consensus Statement on Inpatient Glycemic Control (2015)  Target Ranges:  Prepandial:   less than 140 mg/dL      Peak postprandial:   less than 180 mg/dL (1-2 hours)      Critically ill patients:  140 - 180 mg/dL   Lab Results  Component Value Date   GLUCAP 160 (H) 09/10/2021   HGBA1C 11.9 (A) 07/17/2021    Review of Glycemic Control  Latest Reference Range & Units 09/09/21 19:58 09/09/21 21:44 09/10/21 08:23  Glucose-Capillary 70 - 99 mg/dL 583 (H) 094 (H) 076 (H)  (H): Data is abnormally high Diabetes history: Type 2 DM Outpatient Diabetes medications: Novolog 70/30 20 units BID Current orders for Inpatient glycemic control: Novolog 70/30 20 units BID, Novolog 0-9 units TID & HS   Inpatient Diabetes Program Recommendations:    Spoke with patient again to discuss diabetes management outpatient.  Reviewed patient's current A1c of 11.9%. Explained what a A1c is and what it measures. Also reviewed goal A1c with patient, importance of good glucose control @ home, and blood sugar goals. Reviewed patho of DM, need for improved control, role of pancreas, DKA, vascular changes and commorbidities associated from poor glycemic control.  Patient to make appointment with [facility] for follow up. Claims that he gets his insulin from their pharmacy and has no problems obtaining medications from a transportation or financial perspective. Aware of Relion products from [facility] for same insulin in the event he runs out. Discussed prices and how to obtain in the future.  Admits to missing doses. Encouraged to be mindful and work towards no missing doses.  Denies drinking sugar beverages, although regular ginger ale on bedside table. Reviewed alternatives to sugary drinks, plate method, importance of incorporating protein options with snacks/meals, carb counting, carb alottment and encouraged CHO mindfulness.  Has no further questions at this time.  In  agreement with current orders for discharge.  Thanks, Lujean Rave, MSN, RNC-OB Diabetes Coordinator [phone] (8a-5p)

## 2021-09-10 NOTE — Progress Notes (Signed)
NUTRITION NOTE  Consult received from today at [phone] for patient interested in learning more about diet for diabetes management in anticipation of discharging today.  Able to talk with RN at the end of rounds this AM who relays this message and that patient would be discharged today after being seen by DM Coordinator and RD.   Noted that patient has been seen by DM Coordinator and that discharge summary has been entered.   Due to time constraints RD unable to see patient prior to d/c. Communicated with MD and RN about plan for referral to outpatient RD for diabetes diet-related education and that RD would place a handout with diet-related information in the Discharge Instructions/AVS.     Trenton Gammon, MS, RD, LDN Inpatient Clinical Dietitian RD pager # available in [facility]  After hours/weekend pager # available in [facility]

## 2021-09-10 NOTE — Discharge Instructions (Addendum)

## 2021-09-10 NOTE — Discharge Summary (Signed)
Physician Discharge Summary   Patient: Stanley Edwards MRN: [id number] DOB: June 17, 1966  Admit date:     09/08/2021  Discharge date: 09/10/21  Discharge Physician: Rickey Barbara   PCP: Hoy Register, MD   Recommendations at discharge:    Follow up with PCP in 1-2 weeks, cont to adjust insulin regimen to achieve euglycemia  Discharge Diagnoses: Principal Problem:   DKA (diabetic ketoacidosis) (HCC) Active Problems:   Noncompliance with medications   AKI (acute kidney injury) (HCC)   Non-compliance   Dehydration  Resolved Problems:   * No resolved hospital problems. *   Hospital Course: 56 y.o. male with medical history significant for poorly controlled DM2 complicated by previous episodes of DKA who is admitted to [facility] on 09/08/2021 with DKA after presenting from home to [facility] ED complaining of elevated blood sugar after running out of insulin. Pt admitted for DKA  Assessment and Plan: * DKA (diabetic ketoacidosis) (HCC)- (present on admission) -Improved with IVF and insulin gtt -Have transitioned to subq lantus -Diabetic coordinator following. -Pt has admitted noncompliance with diet. Pt given diet education -Resumed 70/30 at time of d/c  Dehydration- (present on admission) -Improved with IVF hydration  AKI (acute kidney injury) (HCC)- (present on admission) -Likely secondary to presenting DKA -Improved with fluids            Consultants:  Procedures performed:   Disposition: Home Diet recommendation:  Carb modified diet  DISCHARGE MEDICATION: Allergies as of 09/10/2021   No Known Allergies      Medication List     STOP taking these medications    glipiZIDE 10 MG tablet Commonly known as: GLUCOTROL       TAKE these medications    atorvastatin 20 MG tablet Commonly known as: LIPITOR Take 1 tablet (20 mg total) by mouth daily.   insulin aspart protamine- aspart (70-30) 100 UNIT/ML injection Commonly known as: NOVOLOG MIX  70/30 Inject 0.2 mLs (20 Units total) into the skin 2 (two) times daily with a meal.   Insulin Pen Needle 32G X 4 MM Misc 1 each by Does not apply route 2 (two) times daily.   True Metrix Blood Glucose Test test strip Generic drug: glucose blood Use 3 times daily before meals   True Metrix Meter Devi 1 each by Does not apply route 3 (three) times daily before meals.        Follow-up Information     Hoy Register, MD Follow up in 1 week(s).   Specialty: Family Medicine Why: Hospital follow up Contact information: [address] [place] [address] [phone]                 Discharge Exam: There were no vitals filed for this visit. General exam: Awake, laying in bed, in nad Respiratory system: Normal respiratory effort, no wheezing Cardiovascular system: regular rate, s1, s2 Gastrointestinal system: Soft, nondistended, positive BS Central nervous system: CN2-12 grossly intact, strength intact Extremities: Perfused, no clubbing Skin: Normal skin turgor, no notable skin lesions seen Psychiatry: Mood normal // no visual hallucinations    Condition at discharge: good  The results of significant diagnostics from this hospitalization (including imaging, microbiology, ancillary and laboratory) are listed below for reference.   Imaging Studies: DG Chest Port 1 View  Result Date: 09/08/2021 CLINICAL DATA:  Diabetic ketoacidosis.  Weakness. EXAM: PORTABLE CHEST 1 VIEW COMPARISON:  03/24/2016 FINDINGS: The heart size and mediastinal contours are within normal limits. Both lungs are clear. The visualized skeletal  structures are unremarkable. IMPRESSION: No active disease. Electronically Signed   By: Paulina Fusi M.D.   On: 09/08/2021 20:47    Microbiology: Results for orders placed or performed during the hospital encounter of 09/08/21  Resp Panel by RT-PCR (Flu A&B, Covid) Nasopharyngeal Swab     Status: None   Collection Time: 09/08/21  9:10 PM   Specimen:  Nasopharyngeal Swab; Nasopharyngeal(NP) swabs in vial transport medium  Result Value Ref Range Status   SARS Coronavirus 2 by RT PCR NEGATIVE NEGATIVE Final    Comment: (NOTE) SARS-CoV-2 target nucleic acids are NOT DETECTED.  The SARS-CoV-2 RNA is generally detectable in upper respiratory specimens during the acute phase of infection. The lowest concentration of SARS-CoV-2 viral copies this assay can detect is 138 copies/mL. A negative result does not preclude SARS-Cov-2 infection and should not be used as the sole basis for treatment or other patient management decisions. A negative result may occur with  improper specimen collection/handling, submission of specimen other than nasopharyngeal swab, presence of viral mutation(s) within the areas targeted by this assay, and inadequate number of viral copies(<138 copies/mL). A negative result must be combined with clinical observations, patient history, and epidemiological information. The expected result is Negative.  Fact Sheet for Patients:  BloggerCourse.com  Fact Sheet for Healthcare Providers:  SeriousBroker.it  This test is no t yet approved or cleared by the [place] FDA and  has been authorized for detection and/or diagnosis of SARS-CoV-2 by FDA under an Emergency Use Authorization (EUA). This EUA will remain  in effect (meaning this test can be used) for the duration of the COVID-19 declaration under Section 564(b)(1) of the Act, 21 U.S.C.section 360bbb-3(b)(1), unless the authorization is terminated  or revoked sooner.       Influenza A by PCR NEGATIVE NEGATIVE Final   Influenza B by PCR NEGATIVE NEGATIVE Final    Comment: (NOTE) The Xpert Xpress SARS-CoV-2/FLU/RSV plus assay is intended as an aid in the diagnosis of influenza from Nasopharyngeal swab specimens and should not be used as a sole basis for treatment. Nasal washings and aspirates are unacceptable for  Xpert Xpress SARS-CoV-2/FLU/RSV testing.  Fact Sheet for Patients: BloggerCourse.com  Fact Sheet for Healthcare Providers: SeriousBroker.it  This test is not yet approved or cleared by the [place] FDA and has been authorized for detection and/or diagnosis of SARS-CoV-2 by FDA under an Emergency Use Authorization (EUA). This EUA will remain in effect (meaning this test can be used) for the duration of the COVID-19 declaration under Section 564(b)(1) of the Act, 21 U.S.C. section 360bbb-3(b)(1), unless the authorization is terminated or revoked.  Performed at [facility], [address] [place], [place] [address]   MRSA Next Gen by PCR, Nasal     Status: None   Collection Time: 09/09/21  1:28 AM   Specimen: Nasal Mucosa; Nasal Swab  Result Value Ref Range Status   MRSA by PCR Next Gen NOT DETECTED NOT DETECTED Final    Comment: (NOTE) The GeneXpert MRSA Assay (FDA approved for NASAL specimens only), is one component of a comprehensive MRSA colonization surveillance program. It is not intended to diagnose MRSA infection nor to guide or monitor treatment for MRSA infections. Test performance is not FDA approved in patients less than 110 years old. Performed at [facility], [address] [place], [place] [address]     Labs: CBC: Recent Labs  Lab 09/08/21 1836 09/09/21 0252 09/10/21 0321  WBC 7.3 8.0 7.3  NEUTROABS 6.2 6.5  --   HGB 13.6 14.7 9.5*  HCT 42.2 44.0 26.8*  MCV 109.3* 105.5* 102.7*  PLT 361 299 241   Basic Metabolic Panel: Recent Labs  Lab 09/08/21 2100 09/09/21 0252 09/09/21 0850 09/09/21 1147 09/09/21 1546 09/09/21 1945 09/09/21 2326 09/10/21 0321  NA  --  142   < > 141 140 139 135 134*  K  --  3.6   < > 3.6 3.5 3.5 3.5 3.5  CL  --  109   < > 111 112* 110 104 105  CO2  --  17*   < > 21* 23 22 21* 21*  GLUCOSE  --  175*   < > 188* 141* 130* 201* 170*   BUN  --  35*   < > 33* 31* 29* 27* 25*  CREATININE  --  1.31*   < > 1.44* 1.30* 1.23 1.27* 1.16  CALCIUM  --  9.6   < > 9.3 9.1 9.0 8.7* 8.7*  MG 2.7* 2.4  --   --   --   --   --   --   PHOS  --  1.3*  --   --   --   --   --   --    < > = values in this interval not displayed.   Liver Function Tests: Recent Labs  Lab 09/08/21 1836 09/09/21 0252 09/10/21 0321  AST 16 15 16   ALT 17 16 14   ALKPHOS 113 101 78  BILITOT 2.2* 1.7* 1.3*  PROT 9.1* 8.9* 6.3*  ALBUMIN 4.5 4.5 3.3*   CBG: Recent Labs  Lab 09/09/21 1717 09/09/21 1958 09/09/21 2144 09/10/21 0823 09/10/21 1155  GLUCAP 151* 132* 144* 160* 173*    Discharge time spent: less than 30 minutes.  Signed: 09/12/21, MD [facility] 09/10/2021

## 2021-09-11 ENCOUNTER — Telehealth: Payer: Self-pay

## 2021-09-11 ENCOUNTER — Other Ambulatory Visit: Payer: Self-pay

## 2021-09-11 NOTE — Telephone Encounter (Signed)
Transition Care Management Unsuccessful Follow-up Telephone Call  Date of discharge and from where:  09/10/2021  Attempts:  1st Attempt  Reason for unsuccessful TCM follow-up call:  Left voice message on # [phone].  Call back requested to this CM.  Need to schedule hospital follow up appointment at [facility].

## 2021-09-12 ENCOUNTER — Telehealth: Payer: Self-pay

## 2021-09-12 ENCOUNTER — Other Ambulatory Visit: Payer: Self-pay

## 2021-09-12 NOTE — Telephone Encounter (Signed)
Transition Care Management Follow-up Telephone Call Date of discharge and from where: 09/10/2021, [facility]  How have you been since you were released from the hospital? He stated he is feeling okay Any questions or concerns? Yes - he needs paperwork for short term disability completed by Dr Margarita Rana and he will fax the documents to [facility]  Items Reviewed: Did the pt receive and understand the discharge instructions provided? Yes  Medications obtained and verified? Yes  - he said that he has a vial of insulin but needs his insulin pens. He will call [facility] to request delivery of the insulin because he has no transportation today.  Other? No  Any new allergies since your discharge? No  Dietary orders reviewed? No Do you have support at home?  Not addressed  Home Care and Equipment/Supplies: Were home health services ordered? no If so, what is the name of the agency? N/a  Has the agency set up a time to come to the patient's home? not applicable Were any new equipment or medical supplies ordered?  No What is the name of the medical supply agency? N/a Were you able to get the supplies/equipment? not applicable Do you have any questions related to the use of the equipment or supplies? No  He has a glucometer but has not checked his blood sugar today.   Functional Questionnaire: (I = Independent and D = Dependent) ADLs: independent  Follow up appointments reviewed:  PCP Hospital f/u appt confirmed? Yes  Scheduled to see Dr Margarita Rana  - 09/26/2021.  [facility] f/u appt confirmed?  None scheduled at this time.  Are transportation arrangements needed? No  If their condition worsens, is the pt aware to call PCP or go to the Emergency Dept.? Yes Was the patient provided with contact information for the PCP's office or ED? Yes Was to pt encouraged to call back with questions or concerns? Yes

## 2021-09-12 NOTE — Telephone Encounter (Signed)
From the discharge call:  He stated he is feeling okay  He needs paperwork for short term disability completed by Dr Alvis Lemmings and he will fax the documents to [facility]  he said that he has a vial of insulin but needs his insulin pens. He will call [facility] to request delivery of the insulin because he has no transportation today.  Scheduled to see Dr Alvis Lemmings  - 09/26/2021.

## 2021-09-26 ENCOUNTER — Ambulatory Visit: Payer: Self-pay | Admitting: Family Medicine

## 2021-10-30 ENCOUNTER — Ambulatory Visit: Payer: Self-pay | Admitting: Family Medicine

## 2021-11-02 ENCOUNTER — Encounter (HOSPITAL_COMMUNITY): Payer: Self-pay

## 2021-11-02 ENCOUNTER — Inpatient Hospital Stay (HOSPITAL_COMMUNITY)
Admission: EM | Admit: 2021-11-02 | Discharge: 2021-11-07 | DRG: 637 | Disposition: A | Discharge status: Home / Self Care | Payer: Commercial Managed Care - HMO | Attending: Internal Medicine | Admitting: Internal Medicine

## 2021-11-02 ENCOUNTER — Other Ambulatory Visit: Payer: Self-pay

## 2021-11-02 ENCOUNTER — Inpatient Hospital Stay (HOSPITAL_COMMUNITY): Payer: Commercial Managed Care - HMO

## 2021-11-02 ENCOUNTER — Emergency Department (HOSPITAL_COMMUNITY): Payer: Commercial Managed Care - HMO

## 2021-11-02 DIAGNOSIS — E876 Hypokalemia: Secondary | ICD-10-CM

## 2021-11-02 DIAGNOSIS — E872 Acidosis, unspecified: Secondary | ICD-10-CM

## 2021-11-02 DIAGNOSIS — G9341 Metabolic encephalopathy: Secondary | ICD-10-CM

## 2021-11-02 DIAGNOSIS — E119 Type 2 diabetes mellitus without complications: Secondary | ICD-10-CM

## 2021-11-02 DIAGNOSIS — R680 Hypothermia, not associated with low environmental temperature: Secondary | ICD-10-CM | POA: Diagnosis present

## 2021-11-02 DIAGNOSIS — E1110 Type 2 diabetes mellitus with ketoacidosis without coma: Secondary | ICD-10-CM | POA: Diagnosis not present

## 2021-11-02 DIAGNOSIS — R009 Unspecified abnormalities of heart beat: Secondary | ICD-10-CM | POA: Diagnosis not present

## 2021-11-02 DIAGNOSIS — E860 Dehydration: Secondary | ICD-10-CM | POA: Diagnosis present

## 2021-11-02 DIAGNOSIS — N179 Acute kidney failure, unspecified: Secondary | ICD-10-CM | POA: Diagnosis not present

## 2021-11-02 DIAGNOSIS — Z794 Long term (current) use of insulin: Secondary | ICD-10-CM

## 2021-11-02 DIAGNOSIS — I10 Essential (primary) hypertension: Secondary | ICD-10-CM | POA: Diagnosis not present

## 2021-11-02 DIAGNOSIS — Z79899 Other long term (current) drug therapy: Secondary | ICD-10-CM

## 2021-11-02 DIAGNOSIS — E440 Moderate protein-calorie malnutrition: Secondary | ICD-10-CM | POA: Diagnosis not present

## 2021-11-02 DIAGNOSIS — Z91199 Patient's noncompliance with other medical treatment and regimen due to unspecified reason: Secondary | ICD-10-CM

## 2021-11-02 DIAGNOSIS — R011 Cardiac murmur, unspecified: Secondary | ICD-10-CM | POA: Diagnosis not present

## 2021-11-02 DIAGNOSIS — E785 Hyperlipidemia, unspecified: Secondary | ICD-10-CM | POA: Diagnosis present

## 2021-11-02 DIAGNOSIS — E870 Hyperosmolality and hypernatremia: Secondary | ICD-10-CM | POA: Diagnosis not present

## 2021-11-02 DIAGNOSIS — E1111 Type 2 diabetes mellitus with ketoacidosis with coma: Secondary | ICD-10-CM | POA: Diagnosis not present

## 2021-11-02 DIAGNOSIS — E7800 Pure hypercholesterolemia, unspecified: Secondary | ICD-10-CM | POA: Diagnosis not present

## 2021-11-02 DIAGNOSIS — E1010 Type 1 diabetes mellitus with ketoacidosis without coma: Principal | ICD-10-CM | POA: Diagnosis present

## 2021-11-02 DIAGNOSIS — E1169 Type 2 diabetes mellitus with other specified complication: Secondary | ICD-10-CM | POA: Diagnosis present

## 2021-11-02 DIAGNOSIS — Z6821 Body mass index (BMI) 21.0-21.9, adult: Secondary | ICD-10-CM

## 2021-11-02 DIAGNOSIS — E0810 Diabetes mellitus due to underlying condition with ketoacidosis without coma: Secondary | ICD-10-CM | POA: Diagnosis not present

## 2021-11-02 DIAGNOSIS — R571 Hypovolemic shock: Secondary | ICD-10-CM | POA: Diagnosis not present

## 2021-11-02 LAB — CBC WITH DIFFERENTIAL/PLATELET
Abs Immature Granulocytes: 0.18 10*3/uL — ABNORMAL HIGH (ref 0.00–0.07)
Basophils Absolute: 0.1 10*3/uL (ref 0.0–0.1)
Basophils Relative: 0 %
Eosinophils Absolute: 0 10*3/uL (ref 0.0–0.5)
Eosinophils Relative: 0 %
HCT: 45.6 % (ref 39.0–52.0)
Hemoglobin: 13.8 g/dL (ref 13.0–17.0)
Immature Granulocytes: 1 %
Lymphocytes Relative: 2 %
Lymphs Abs: 0.4 10*3/uL — ABNORMAL LOW (ref 0.7–4.0)
MCH: 36.3 pg — ABNORMAL HIGH (ref 26.0–34.0)
MCHC: 30.3 g/dL (ref 30.0–36.0)
MCV: 120 fL — ABNORMAL HIGH (ref 80.0–100.0)
Monocytes Absolute: 0.7 10*3/uL (ref 0.1–1.0)
Monocytes Relative: 4 %
Neutro Abs: 15 10*3/uL — ABNORMAL HIGH (ref 1.7–7.7)
Neutrophils Relative %: 93 %
Platelets: 294 10*3/uL (ref 150–400)
RBC: 3.8 MIL/uL — ABNORMAL LOW (ref 4.22–5.81)
RDW: 12 % (ref 11.5–15.5)
WBC: 16.3 10*3/uL — ABNORMAL HIGH (ref 4.0–10.5)
nRBC: 0 % (ref 0.0–0.2)

## 2021-11-02 LAB — CBG MONITORING, ED
Glucose-Capillary: >600 mg/dL (ref 70–99)
Glucose-Capillary: >600 mg/dL (ref 70–99)
Glucose-Capillary: >600 mg/dL (ref 70–99)
Glucose-Capillary: >600 mg/dL (ref 70–99)
Glucose-Capillary: 560 mg/dL (ref 70–99)
Glucose-Capillary: >600 mg/dL (ref 70–99)
Glucose-Capillary: 501 mg/dL (ref 70–99)
Glucose-Capillary: 506 mg/dL (ref 70–99)

## 2021-11-02 LAB — I-STAT VENOUS BLOOD GAS, ED
Acid-base deficit: 28 mmol/L — ABNORMAL HIGH (ref 0.0–2.0)
Acid-base deficit: 29 mmol/L — ABNORMAL HIGH (ref 0.0–2.0)
Bicarbonate: 3.8 mmol/L — ABNORMAL LOW (ref 20.0–28.0)
Bicarbonate: 3.9 mmol/L — ABNORMAL LOW (ref 20.0–28.0)
Calcium, Ion: 1.15 mmol/L (ref 1.15–1.40)
Calcium, Ion: 1.15 mmol/L (ref 1.15–1.40)
HCT: 45 % (ref 39.0–52.0)
HCT: 47 % (ref 39.0–52.0)
Hemoglobin: 15.3 g/dL (ref 13.0–17.0)
Hemoglobin: 16 g/dL (ref 13.0–17.0)
O2 Saturation: 59 %
O2 Saturation: 87 %
Potassium: 4.8 mmol/L (ref 3.5–5.1)
Potassium: 4.9 mmol/L (ref 3.5–5.1)
Sodium: 137 mmol/L (ref 135–145)
Sodium: 138 mmol/L (ref 135–145)
TCO2: <5 mmol/L — ABNORMAL LOW (ref 22–32)
TCO2: <5 mmol/L — ABNORMAL LOW (ref 22–32)
pCO2, Ven: 19.7 mmHg — CL (ref 44–60)
pCO2, Ven: 21.6 mmHg — ABNORMAL LOW (ref 44–60)
pH, Ven: 6.865 — CL (ref 7.25–7.43)
pH, Ven: 6.894 — CL (ref 7.25–7.43)
pO2, Ven: 50 mmHg — ABNORMAL HIGH (ref 32–45)
pO2, Ven: 91 mmHg — ABNORMAL HIGH (ref 32–45)

## 2021-11-02 LAB — CBC
HCT: 35.6 % — ABNORMAL LOW (ref 39.0–52.0)
Hemoglobin: 11.1 g/dL — ABNORMAL LOW (ref 13.0–17.0)
MCH: 36.2 pg — ABNORMAL HIGH (ref 26.0–34.0)
MCHC: 31.2 g/dL (ref 30.0–36.0)
MCV: 116 fL — ABNORMAL HIGH (ref 80.0–100.0)
Platelets: 213 10*3/uL (ref 150–400)
RBC: 3.07 MIL/uL — ABNORMAL LOW (ref 4.22–5.81)
RDW: 12 % (ref 11.5–15.5)
WBC: 13 10*3/uL — ABNORMAL HIGH (ref 4.0–10.5)
nRBC: 0 % (ref 0.0–0.2)

## 2021-11-02 LAB — URINALYSIS, ROUTINE W REFLEX MICROSCOPIC
Bacteria, UA: NONE SEEN
Bilirubin Urine: NEGATIVE
Glucose, UA: >=500 mg/dL — AB
Ketones, ur: 80 mg/dL — AB
Leukocytes,Ua: NEGATIVE
Nitrite: NEGATIVE
Protein, ur: 30 mg/dL — AB
Specific Gravity, Urine: 1.021 (ref 1.005–1.030)
pH: 5 (ref 5.0–8.0)

## 2021-11-02 LAB — BASIC METABOLIC PANEL
Anion gap: 24 — ABNORMAL HIGH (ref 5–15)
BUN: 65 mg/dL — ABNORMAL HIGH (ref 6–20)
BUN: 59 mg/dL — ABNORMAL HIGH (ref 6–20)
CO2: <7 mmol/L — ABNORMAL LOW (ref 22–32)
CO2: 15 mmol/L — ABNORMAL LOW (ref 22–32)
Calcium: 8.3 mg/dL — ABNORMAL LOW (ref 8.9–10.3)
Calcium: 8.4 mg/dL — ABNORMAL LOW (ref 8.9–10.3)
Chloride: 104 mmol/L (ref 98–111)
Chloride: 108 mmol/L (ref 98–111)
Creatinine, Ser: 4.15 mg/dL — ABNORMAL HIGH (ref 0.61–1.24)
Creatinine, Ser: 3.48 mg/dL — ABNORMAL HIGH (ref 0.61–1.24)
GFR, Estimated: 16 mL/min — ABNORMAL LOW (ref >60)
GFR, Estimated: 20 mL/min — ABNORMAL LOW (ref >60)
Glucose, Bld: 802 mg/dL (ref 70–99)
Glucose, Bld: 431 mg/dL — ABNORMAL HIGH (ref 70–99)
Potassium: 4.3 mmol/L (ref 3.5–5.1)
Potassium: 3.1 mmol/L — ABNORMAL LOW (ref 3.5–5.1)
Sodium: 141 mmol/L (ref 135–145)
Sodium: 147 mmol/L — ABNORMAL HIGH (ref 135–145)

## 2021-11-02 LAB — GLUCOSE, CAPILLARY
Glucose-Capillary: 445 mg/dL — ABNORMAL HIGH (ref 70–99)
Glucose-Capillary: 437 mg/dL — ABNORMAL HIGH (ref 70–99)
Glucose-Capillary: 356 mg/dL — ABNORMAL HIGH (ref 70–99)

## 2021-11-02 LAB — TROPONIN I (HIGH SENSITIVITY)
Troponin I (High Sensitivity): 13 ng/L (ref <18)
Troponin I (High Sensitivity): 14 ng/L (ref <18)

## 2021-11-02 LAB — I-STAT CHEM 8, ED
BUN: 62 mg/dL — ABNORMAL HIGH (ref 6–20)
Calcium, Ion: 1.16 mmol/L (ref 1.15–1.40)
Chloride: 110 mmol/L (ref 98–111)
Creatinine, Ser: 3.8 mg/dL — ABNORMAL HIGH (ref 0.61–1.24)
Glucose, Bld: >700 mg/dL (ref 70–99)
HCT: 47 % (ref 39.0–52.0)
Hemoglobin: 16 g/dL (ref 13.0–17.0)
Potassium: 4.9 mmol/L (ref 3.5–5.1)
Sodium: 138 mmol/L (ref 135–145)
TCO2: 6 mmol/L — ABNORMAL LOW (ref 22–32)

## 2021-11-02 LAB — COMPREHENSIVE METABOLIC PANEL
ALT: 21 U/L (ref 0–44)
AST: 13 U/L — ABNORMAL LOW (ref 15–41)
Albumin: 4.3 g/dL (ref 3.5–5.0)
Alkaline Phosphatase: 105 U/L (ref 38–126)
BUN: 66 mg/dL — ABNORMAL HIGH (ref 6–20)
CO2: <7 mmol/L — ABNORMAL LOW (ref 22–32)
Calcium: 9.7 mg/dL (ref 8.9–10.3)
Chloride: 97 mmol/L — ABNORMAL LOW (ref 98–111)
Creatinine, Ser: 4.5 mg/dL — ABNORMAL HIGH (ref 0.61–1.24)
GFR, Estimated: 15 mL/min — ABNORMAL LOW (ref >60)
Glucose, Bld: 888 mg/dL (ref 70–99)
Potassium: 5 mmol/L (ref 3.5–5.1)
Sodium: 141 mmol/L (ref 135–145)
Total Bilirubin: 2.5 mg/dL — ABNORMAL HIGH (ref 0.3–1.2)
Total Protein: 8.4 g/dL — ABNORMAL HIGH (ref 6.5–8.1)

## 2021-11-02 LAB — HEMOGLOBIN A1C
Hgb A1c MFr Bld: 11.8 % — ABNORMAL HIGH (ref 4.8–5.6)
Mean Plasma Glucose: 291.96 mg/dL

## 2021-11-02 LAB — LACTIC ACID, PLASMA
Lactic Acid, Venous: 4.7 mmol/L (ref 0.5–1.9)
Lactic Acid, Venous: 2.5 mmol/L (ref 0.5–1.9)

## 2021-11-02 LAB — MRSA NEXT GEN BY PCR, NASAL: MRSA by PCR Next Gen: NOT DETECTED

## 2021-11-02 LAB — BETA-HYDROXYBUTYRIC ACID
Beta-Hydroxybutyric Acid: >8 mmol/L — ABNORMAL HIGH (ref 0.05–0.27)
Beta-Hydroxybutyric Acid: >8 mmol/L — ABNORMAL HIGH (ref 0.05–0.27)

## 2021-11-02 LAB — PROCALCITONIN: Procalcitonin: 0.81 ng/mL

## 2021-11-02 MED ORDER — HEPARIN SODIUM (PORCINE) 5000 UNIT/ML IJ SOLN
5000.0000 [IU] | Freq: Three times a day (TID) | INTRAMUSCULAR | Status: DC
  Administered 2021-11-02 – 2021-11-07 (×14): 5000 [IU] via SUBCUTANEOUS
  Filled 2021-11-02 (×13): qty 1

## 2021-11-02 MED ORDER — LACTATED RINGERS IV SOLN: INTRAVENOUS | Status: DC

## 2021-11-02 MED ORDER — SODIUM BICARBONATE 8.4 % IV SOLN
100.0000 meq | Freq: Once | INTRAVENOUS | Status: AC
  Administered 2021-11-02: 100 meq via INTRAVENOUS
  Filled 2021-11-02: qty 50

## 2021-11-02 MED ORDER — INSULIN REGULAR(HUMAN) IN NACL 100-0.9 UT/100ML-% IV SOLN
INTRAVENOUS | Status: DC
  Administered 2021-11-02: 11.5 [IU]/h via INTRAVENOUS
  Administered 2021-11-02: 13 [IU]/h via INTRAVENOUS
  Filled 2021-11-02: qty 100

## 2021-11-02 MED ORDER — LACTATED RINGERS IV BOLUS
1000.0000 mL | Freq: Once | INTRAVENOUS | Status: AC
  Administered 2021-11-02: 1000 mL via INTRAVENOUS

## 2021-11-02 MED ORDER — LACTATED RINGERS IV BOLUS
20.0000 mL/kg | Freq: Once | INTRAVENOUS | Status: AC
  Administered 2021-11-02: 1542 mL via INTRAVENOUS

## 2021-11-02 MED ORDER — INSULIN REGULAR(HUMAN) IN NACL 100-0.9 UT/100ML-% IV SOLN
INTRAVENOUS | Status: DC
  Administered 2021-11-02: 10 [IU]/h via INTRAVENOUS
  Filled 2021-11-02: qty 100

## 2021-11-02 MED ORDER — SODIUM CHLORIDE 0.9 % IV SOLN: 2.0000 g | INTRAVENOUS | Status: DC

## 2021-11-02 MED ORDER — LACTATED RINGERS IV BOLUS
1000.0000 mL | INTRAVENOUS | Status: AC
  Administered 2021-11-02: 1000 mL via INTRAVENOUS

## 2021-11-02 MED ORDER — POTASSIUM CHLORIDE 2 MEQ/ML IV SOLN: INTRAVENOUS | Status: DC

## 2021-11-02 MED ORDER — LACTATED RINGERS IV BOLUS: 20.0000 mL/kg | Freq: Once | INTRAVENOUS | Status: DC

## 2021-11-02 MED ORDER — VANCOMYCIN HCL 1500 MG/300ML IV SOLN
1500.0000 mg | Freq: Once | INTRAVENOUS | Status: AC
  Administered 2021-11-02: 1500 mg via INTRAVENOUS
  Filled 2021-11-02: qty 300

## 2021-11-02 MED ORDER — STERILE WATER FOR INJECTION IV SOLN
INTRAMUSCULAR | Status: AC
Start: 2021-11-02 — End: 2021-11-02
  Filled 2021-11-02: qty 1000

## 2021-11-02 MED ORDER — DEXTROSE IN LACTATED RINGERS 5 % IV SOLN: INTRAVENOUS | Status: DC

## 2021-11-02 MED ORDER — SODIUM CHLORIDE 0.9 % IV SOLN
2.0000 g | INTRAVENOUS | Status: DC
  Administered 2021-11-02 – 2021-11-04 (×3): 2 g via INTRAVENOUS
  Filled 2021-11-02 (×3): qty 20

## 2021-11-02 MED ORDER — METRONIDAZOLE 500 MG/100ML IV SOLN
500.0000 mg | Freq: Two times a day (BID) | INTRAVENOUS | Status: DC
  Administered 2021-11-02 – 2021-11-04 (×4): 500 mg via INTRAVENOUS
  Filled 2021-11-02 (×4): qty 100

## 2021-11-02 MED ORDER — DEXTROSE 50 % IV SOLN: 0.0000 mL | INTRAVENOUS | Status: DC | PRN

## 2021-11-02 MED ORDER — VANCOMYCIN VARIABLE DOSE PER UNSTABLE RENAL FUNCTION (PHARMACIST DOSING): Status: DC

## 2021-11-02 MED ORDER — KCL IN DEXTROSE-NACL 20-5-0.45 MEQ/L-%-% IV SOLN
INTRAVENOUS | Status: DC
Start: 2021-11-03 — End: 2021-11-03
  Filled 2021-11-02 (×3): qty 1000

## 2021-11-02 MED ORDER — POTASSIUM CHLORIDE 10 MEQ/100ML IV SOLN
10.0000 meq | INTRAVENOUS | Status: DC
  Filled 2021-11-02: qty 100

## 2021-11-02 MED ORDER — POTASSIUM CHLORIDE 10 MEQ/100ML IV SOLN
10.0000 meq | INTRAVENOUS | Status: AC
  Administered 2021-11-02 (×2): 10 meq via INTRAVENOUS
  Filled 2021-11-02: qty 100

## 2021-11-02 NOTE — ED Provider Notes (Signed)
?[facility] EMERGENCY DEPARTMENT ?Provider Note ? ? ?CSN: [id number] ?Arrival date & time: 11/02/21  1405 ? ?  ? ?History ? ?Chief Complaint  ?Patient presents with  ? unresponsive  ? ? ?Stanley Edwards is a 56 y.o. male. ? ?HPI ? ?  ? ?56 year old male with a history of diabetes, hypertension, prior admission for DKA, presents with concern for altered mental status. ? ?Per family he was in her normal state of health last night, but when she went to go see him found him unresponsive today.  On EMS arrival they report initially GCS 4, no sign of trauma, he improved en route and was answering some questions, reported taking insulin yesterday. Denied pain.  Glucose 495 per EMS. No known fevers. ? ?History limited by mental status.  ? ?Sister reports doesn't think he has been taking his insulin, has had DKA before.  Sister is on chemo and also working so doesn't see him during the week much. Last night he seemed generally weak. This AM said he was going to go take insulin but when she go there he was on the floor and altered. ? ?Past Medical History:  ?Diagnosis Date  ? Allergy   ? Diabetes mellitus without complication (HCC)   ? Hypertension   ? Pt denies htn but it was on his previous hx  ?  ?Home Medications ?Prior to Admission medications   ?Medication Sig Start Date End Date Taking? Authorizing Provider  ?atorvastatin (LIPITOR) 20 MG tablet Take 1 tablet (20 mg total) by mouth daily. 08/01/21   Hoy Register, MD  ?Blood Glucose Monitoring Suppl (TRUE METRIX METER) DEVI 1 each by Does not apply route 3 (three) times daily before meals. ?Patient not taking: Reported on 08/01/2021 05/30/20   Hoy Register, MD  ?glucose blood (TRUE METRIX BLOOD GLUCOSE TEST) test strip Use 3 times daily before meals ?Patient not taking: Reported on 02/11/2021 05/30/20   Hoy Register, MD  ?insulin aspart protamine- aspart (NOVOLOG MIX 70/30) (70-30) 100 UNIT/ML injection Inject 0.2 mLs (20 Units total) into the skin 2  (two) times daily with a meal. 09/10/21 10/10/21  Jerald Kief, MD  ?Insulin Pen Needle 32G X 4 MM MISC 1 each by Does not apply route 2 (two) times daily. 09/10/21   Jerald Kief, MD  ?   ? ?Allergies    ?Patient has no known allergies.   ? ?Review of Systems   ?Review of Systems ? ?Physical Exam ?Updated Vital Signs ?BP (!) 84/54   Pulse 90   Temp (!) 96.5 ?F (35.8 ?C) (Temporal)   Resp (!) 22   Wt 77.1 kg   SpO2 100%   BMI 21.82 kg/m?  ?Physical Exam ?Vitals and nursing note reviewed.  ?Constitutional:   ?   General: He is in acute distress.  ?   Appearance: He is well-developed and underweight. He is ill-appearing. He is not diaphoretic.  ?HENT:  ?   Head: Normocephalic and atraumatic.  ?   Mouth/Throat:  ?   Mouth: Mucous membranes are dry.  ?Eyes:  ?   Conjunctiva/sclera: Conjunctivae normal.  ?Cardiovascular:  ?   Rate and Rhythm: Normal rate and regular rhythm.  ?   Heart sounds: Normal heart sounds. No murmur heard. ?  No friction rub. No gallop.  ?Pulmonary:  ?   Effort: Pulmonary effort is normal. Tachypnea present. No respiratory distress.  ?   Breath sounds: Normal breath sounds. No wheezing or rales.  ?Abdominal:  ?  General: There is no distension.  ?   Palpations: Abdomen is soft.  ?   Tenderness: There is no abdominal tenderness. There is no guarding.  ?Musculoskeletal:  ?   Cervical back: Normal range of motion.  ?Skin: ?   General: Skin is warm and dry.  ?Neurological:  ?   Mental Status: He is lethargic.  ?   GCS: GCS eye subscore is 4. GCS verbal subscore is 3. GCS motor subscore is 6.  ? ? ?ED Results / Procedures / Treatments   ?Labs ?(all labs ordered are listed, but only abnormal results are displayed) ?Labs Reviewed  ?CBC WITH DIFFERENTIAL/PLATELET - Abnormal; Notable for the following components:  ?    Result Value  ? WBC 16.3 (*)   ? RBC 3.80 (*)   ? MCV 120.0 (*)   ? MCH 36.3 (*)   ? All other components within normal limits  ?URINALYSIS, ROUTINE W REFLEX MICROSCOPIC -  Abnormal; Notable for the following components:  ? Glucose, UA >=500 (*)   ? Hgb urine dipstick SMALL (*)   ? Ketones, ur 80 (*)   ? Protein, ur 30 (*)   ? All other components within normal limits  ?CBG MONITORING, ED - Abnormal; Notable for the following components:  ? Glucose-Capillary >600 (*)   ? All other components within normal limits  ?I-STAT VENOUS BLOOD GAS, ED - Abnormal; Notable for the following components:  ? pH, Ven 6.894 (*)   ? pCO2, Ven 19.7 (*)   ? pO2, Ven 50 (*)   ? Bicarbonate 3.8 (*)   ? TCO2 <5 (*)   ? Acid-base deficit 28.0 (*)   ? All other components within normal limits  ?I-STAT CHEM 8, ED - Abnormal; Notable for the following components:  ? BUN 62 (*)   ? Creatinine, Ser 3.80 (*)   ? Glucose, Bld >700 (*)   ? TCO2 6 (*)   ? All other components within normal limits  ?CBG MONITORING, ED - Abnormal; Notable for the following components:  ? Glucose-Capillary >600 (*)   ? All other components within normal limits  ?I-STAT VENOUS BLOOD GAS, ED - Abnormal; Notable for the following components:  ? pH, Ven 6.865 (*)   ? pCO2, Ven 21.6 (*)   ? pO2, Ven 91 (*)   ? Bicarbonate 3.9 (*)   ? TCO2 <5 (*)   ? Acid-base deficit 29.0 (*)   ? All other components within normal limits  ?BETA-HYDROXYBUTYRIC ACID  ?BETA-HYDROXYBUTYRIC ACID  ?COMPREHENSIVE METABOLIC PANEL  ?LACTIC ACID, PLASMA  ?LACTIC ACID, PLASMA  ?CBG MONITORING, ED  ? ? ?EKG ?EKG Interpretation ? ?Date/Time:  Saturday November 02 2021 14:10:38 EDT ?Ventricular Rate:  97 ?PR Interval:  146 ?QRS Duration: 104 ?QT Interval:  396 ?QTC Calculation: 504 ?R Axis:   85 ?Text Interpretation: Sinus rhythm Probable left atrial enlargement Left ventricular hypertrophy Prolonged QT interval No significant change since last tracing Confirmed by Alvira Monday ([id number]) on 11/02/2021 3:06:36 PM ? ?Radiology ?DG Chest Portable 1 View ? ?Result Date: 11/02/2021 ?CLINICAL DATA:  ams EXAM: PORTABLE CHEST 1 VIEW COMPARISON:  Chest x-ray 09/08/2021 FINDINGS: The  heart and mediastinal contours are unchanged. No focal consolidation. No pulmonary edema. No pleural effusion. No pneumothorax. No acute osseous abnormality. IMPRESSION: No active disease. Electronically Signed   By: Tish Frederickson M.D.   On: 11/02/2021 15:13   ? ?Procedures ?Marland KitchenCritical Care ?Performed by: Alvira Monday, MD ?Authorized by: Alvira Monday, MD  ? ?Critical  care provider statement:  ?  Critical care time (minutes):  30 ?  Critical care was time spent personally by me on the following activities:  Development of treatment plan with patient or surrogate, discussions with consultants, evaluation of patient's response to treatment, examination of patient, ordering and review of laboratory studies, ordering and review of radiographic studies, ordering and performing treatments and interventions, pulse oximetry, re-evaluation of patient's condition and review of old charts  ? ? ?Medications Ordered in ED ?Medications  ?lactated ringers bolus 1,542 mL (has no administration in time range)  ?insulin regular, human (MYXREDLIN) 100 units/ 100 mL infusion (has no administration in time range)  ?lactated ringers infusion (has no administration in time range)  ?dextrose 5 % in lactated ringers infusion (has no administration in time range)  ?dextrose 50 % solution 0-50 mL (has no administration in time range)  ?potassium chloride 10 mEq in 100 mL IVPB (has no administration in time range)  ?sodium bicarbonate 150 mEq in sterile water 1,150 mL infusion (has no administration in time range)  ?lactated ringers bolus 1,000 mL (has no administration in time range)  ?sodium bicarbonate injection 100 mEq (has no administration in time range)  ?lactated ringers bolus 1,542 mL (1,542 mLs Intravenous New Bag/Given 11/02/21 1518)  ? ? ?ED Course/ Medical Decision Making/ A&P ?  ?                        ?Medical Decision Making ?Amount and/or Complexity of Data Reviewed ?Labs: ordered. ?Radiology:  ordered. ? ?Risk ?Prescription drug management. ? ? ? ?56 year old male with a history of diabetes, hypertension, prior admission for DKA, presents with concern for altered mental status. ? ?Ill appearing, dry, hypotensive on arrival with glucos

## 2021-11-02 NOTE — ED Notes (Signed)
Central line placed by critical care [place]  ?

## 2021-11-02 NOTE — ED Notes (Signed)
Pt taken off the bair hugger temp 97.4 orally  pt incontinent of urine ?

## 2021-11-02 NOTE — Progress Notes (Signed)
eLink Physician-Brief Progress Note ?Patient Name: Stanley Edwards ?DOB: May 29, 1966 ?MRN: [id number] ? ? ?Date of Service ? 11/02/2021  ?HPI/Events of Note ? 37 M history of DM (A1C 11.8) recurrent DKA, hypertension, brought in after being found down. On work up was in DKA with BHB >8, bicarb <7, AG not calculated, glucose 888, corrected sodium 153, K 5 ? ?Corrected sodium now 152, K 3.1, bicarb 15, AG 24  ?eICU Interventions ? Continued on insulin drip ?Switch D5 LR to D5 0.45% with 20 K  ? ? ? ?Intervention Category ?Evaluation Type: New Patient Evaluation ? ?Darl Pikes ?11/02/2021, 10:39 PM ?

## 2021-11-02 NOTE — ED Notes (Signed)
Multiple unsuccessful PIV attempts x 3 staff ?

## 2021-11-02 NOTE — Assessment & Plan Note (Signed)
-  Hold antilipid agents while in shock. ?

## 2021-11-02 NOTE — ED Notes (Signed)
No purple or red man placed ?

## 2021-11-02 NOTE — Assessment & Plan Note (Signed)
Due to dehydration for osmotic diuresis from diabetic ketoacidosis. ? ?-Isotonic fluid resuscitation until clinically euvolemic. ?-Target MAP greater than 65, systolic blood pressure greater than 100. ?-Follow serial lactate. ?

## 2021-11-02 NOTE — Assessment & Plan Note (Addendum)
Patient has normal baseline serum creatinine (1.16 as of 09/10/2021) ?AKI secondary to hypovolemic shock creatinine improving from 4.15 to 2.20 ? ?-Continue with IV fluids for now until taking oral consistently ?

## 2021-11-02 NOTE — ED Notes (Signed)
According  to the charge nurse  staff is tied up and she cannot give me the last name of the nurse that will be taking this pt ?

## 2021-11-02 NOTE — Assessment & Plan Note (Deleted)
Clinically hypovolemic due to osmotic diuresis from DKA.  ? ?- Continue isotonic fluid resuscitation, follow BP and HR.  ?

## 2021-11-02 NOTE — Assessment & Plan Note (Addendum)
Recurrent admissions for DKA (approximately 1 once every 6 weeks)  ?AG now 9 with Bicarb 26 ? ?- Transition to [place] insulin  ? ?

## 2021-11-02 NOTE — Progress Notes (Signed)
Pharmacy Antibiotic Note ? ?TOWNES FUHS is a 56 y.o. male for which pharmacy has been consulted for vancomycin dosing for sepsis. ? ?Patient with a history of DM and HTN. Patient presenting unresponsive with DKA. ? ?SCr 3.8 - AKI ?WBC 16.3; LA 4.7; T 96.5 F; RR 24; HR 88 ? ?Plan: ?Metronidazole per MD ?Ceftriaxone per MD ?Vancomycin 1500 mg once, subsequent dosing as indicated per random vancomycin level until renal function stable and/or improved, at which time scheduled dosing can be considered ?Trend WBC, Fever, Renal function, & Clinical course ?F/u cultures, clinical course, WBC, fever ?De-escalate when able ?Levels at steady state ? ?Weight: 77.1 kg (169 lb 15.6 oz) ? ?Temp (24hrs), Avg:96.5 ?F (35.8 ?C), Min:96.5 ?F (35.8 ?C), Max:96.5 ?F (35.8 ?C) ? ?Recent Labs  ?Lab 11/02/21 ?1431 11/02/21 ?1434 11/02/21 ?1444  ?WBC  --  16.3*  --   ?CREATININE  --  4.50* 3.80*  ?LATICACIDVEN 4.7*  --   --   ?  ?Estimated Creatinine Clearance: 23.7 mL/min (A) (by C-G formula based on SCr of 3.8 mg/dL (H)).   ? ?No Known Allergies ? ?Antimicrobials this admission: ?ceftriazone 4/8 >>  ?vancomycin 4/8 >> ? ?Microbiology results: ?Pending ? ?Thank you for allowing pharmacy to be a part of this patient?s care. ? ?Delmar Landau, PharmD, BCPS ?11/02/2021 4:14 PM ?ED Clinical Pharmacist -  [phone] ?  ?

## 2021-11-02 NOTE — ED Notes (Signed)
Pt to c-t then returned to ed   ?

## 2021-11-02 NOTE — ED Triage Notes (Addendum)
Patient found unresponsive at home with GCS 4. Arrived with no PIV and CBG 495.  ?MD and RT at bedside ?Patient on arrival not following commands but trying to verbalize with incomprehensible words.patient denies pain, nol signs of trauma. On arrival to ED CBG greater than 600. ?Last insulin dose yesterday per sister. ? ?

## 2021-11-02 NOTE — ED Notes (Signed)
Call made to chg on 2h for purple man  ?

## 2021-11-02 NOTE — ED Notes (Signed)
Bair hugger after getting a temp n90.2 ?

## 2021-11-02 NOTE — Assessment & Plan Note (Addendum)
Patient with recurrent episodes of DKA since December 2022.  Had previously done well with a hemoglobin A1c of 6.2% as of August. ? ?-Needs immediate referral to endocrinology on discharge. ?-Consult diabetes educator on this admission. ? ?

## 2021-11-02 NOTE — ED Notes (Signed)
Total lr infused ?

## 2021-11-02 NOTE — Assessment & Plan Note (Signed)
-  Hold antihypertensive medications while in shock. ?

## 2021-11-02 NOTE — H&P (Signed)
? ?NAME:  Stanley Edwards, MRN:  [id number], DOB:  May 30, 1966, LOS: 0 ?ADMISSION DATE:  11/02/2021, CONSULTATION DATE: 11/02/2021 ?REFERRING MD: Stanley Edwards, ED.  CHIEF COMPLAINT: Diabetic ketoacidosis ? ?History of Present Illness:  ?56 year old man who was brought to the ED via EMS.  Sister heard a thump and found him brother unresponsive with a GCS of of apparently 4.  CBG was initially 495.  Able to verbalize in the ED with incomprehensible words.  No signs of trauma.  Last dose of insulin was yesterday. ? ?Several recent admissions approximately every 6 weeks with diabetic ketoacidosis since December.  Apparently had reasonably good control prior to this.  Unclear what has changed in his social situation to account for this. ? ?Pertinent  Medical History  ? ?Past Medical History:  ?Diagnosis Date  ? Allergy   ? Diabetes mellitus without complication (HCC)   ? Hypertension   ? Pt denies htn but it was on his previous hx  ? ?History reviewed. No pertinent surgical history. ? ? ?Significant Hospital Events: ?Including procedures, antibiotic start and stop dates in addition to other pertinent events   ?4/8-admission for severe DKA ? ?Interim History / Subjective:  ?Patient received 1 L of fluid resuscitation at my arrival.  Difficult IV access. ? ?Objective   ?Blood pressure (!) 86/55, pulse 88, temperature (!) 96.5 ?F (35.8 ?C), temperature source Temporal, resp. rate (!) 24, weight 77.1 kg, SpO2 100 %. ?   ?   ?No intake or output data in the 24 hours ending 11/02/21 1627 ?Filed Weights  ? 11/02/21 1415  ?Weight: 77.1 kg  ? ? ?Examination: ?General: Thin man, appears cachectic encephalopathic and hypothermic. ?HENT: Dry mucous membranes. ?Lungs: Chest clear to auscultation bilaterally ?Cardiovascular: JVP is flat.  Normal S1-S2 no murmurs appreciated.  No peripheral edema.  Extremities are cold with poor capillary refill. ?Abdomen: Abdomen is soft and nontender. ?Extremities: Will occasionally move all  limbs spontaneously.  Not following commands ?Neuro: Patient is somnolent but responds to pain.   ?GU: No Foley catheter in place ? ?Ancillary tests personally reviewed:  ?Metabolic acidosis pH 6.89 with PCO2 19.7 ?Sodium 141 ?Bicarb less than 7 ?Blood glucose 888 ?Lactate 4.7 ?Mild leukocytosis 16.3, hemoglobin 13.8 ?Chest x-ray shows clear lung fields. ?EKG shows normal sinus rhythm with evidence of LVH but no evidence of ischemic changes. ?Assessment & Plan:  ?Hypovolemic shock (HCC) ?Due to dehydration for osmotic diuresis from diabetic ketoacidosis. ? ?-Isotonic fluid resuscitation until clinically euvolemic. ?-Target MAP greater than 65, systolic blood pressure greater than 100. ?-Follow serial lactate. ? ?Diabetic ketoacidosis without coma associated with type 2 diabetes mellitus (HCC) ?Recurrent admissions for DKA (approximately 1 once every 6 weeks) ?Severe metabolic acidosis with pH 6.85 ?Severe hyperglycemia 888. ? ?-Insulin administration as per Endo tool ?-Continue fluid resuscitation as per DKA protocol, transition to potassium and glucose containing fluids when potassium normalizes, CBG less than 250 respectively. ? ?AKI (acute kidney injury) (HCC) ?Patient has normal baseline serum creatinine (1.16 as of 09/10/2021) ?AKI secondary to hypovolemic shock. ? ?-No current indications for dialysis.  Should improve with volume resuscitation. ?-Follow serial serum chemistries as per usual DKA protocol. ?-Nephrology consultation if creatinine fails to improve with fluid resuscitation by tomorrow morning. ? ?DM2 (diabetes mellitus, type 2) (HCC) ?Patient with recurrent episodes of DKA since December 2022.  Had previously done well with a hemoglobin A1c of 6.2% as of August. ? ?-Needs immediate referral to endocrinology on discharge. ?-Consult diabetes educator on this  admission. ?-We will explore family and social circumstances with patient's sister. ? ?Hypertension ?-Hold antihypertensive medications while in  shock. ? ?Hyperlipidemia ?-Hold antilipid agents while in shock. ? ?Acute metabolic encephalopathy ?Patient is encephalopathic, does not follow commands but is able to protect airway for now. ? ?-Close neurological observation.  Mental status should improve with correction of DKA. ?  ? ? ?Best Practice (right click and "Reselect all SmartList Selections" daily)  ? ?Diet/type: NPO ?DVT prophylaxis: prophylactic heparin  ?GI prophylaxis: N/A ?Lines: Central line ?Foley:  Yes, and it is still needed ?Code Status:  full code ?Last date of multidisciplinary goals of care discussion [pending] ?CRITICAL CARE ?Performed by: Lynnell Catalan ? ? ?Total critical care time: 60 minutes ? ?Critical care time was exclusive of separately billable procedures and treating other patients. ? ?Critical care was necessary to treat or prevent imminent or life-threatening deterioration. ? ?Critical care was time spent personally by me on the following activities: development of treatment plan with patient and/or surrogate as well as nursing, discussions with consultants, evaluation of patient's response to treatment, examination of patient, obtaining history from patient or surrogate, ordering and performing treatments and interventions, ordering and review of laboratory studies, ordering and review of radiographic studies, pulse oximetry, re-evaluation of patient's condition and participation in multidisciplinary rounds. ? ?Lynnell Catalan, MD FRCPC ?ICU Physician ?[facility]  ?Pager: [phone] ?Mobile: [phone] ?After hours: [phone]. ? ? ?  ?

## 2021-11-02 NOTE — Progress Notes (Signed)
At bedside to place PIV.  MD at bedside placing CVC. ?

## 2021-11-02 NOTE — Assessment & Plan Note (Addendum)
Patient is encephalopathic, but showing some improvement. No focal deficits.  ? ?-Close neurological observation.  Mental status should continue to improve ?

## 2021-11-02 NOTE — ED Notes (Signed)
Critical care at  the bedside placing a central line ?

## 2021-11-02 NOTE — Procedures (Signed)
Central Venous Catheter Insertion Procedure Note ? ?Stanley Edwards  ?[id number]  ?Sep 20, 1965 ? ?Date:11/02/21  ?Time:4:48 PM  ? ?Provider Performing:Leo Weyandt  ? ?Procedure: Insertion of Non-tunneled Central Venous Catheter(36556) with US guidance [id number])  ? ?Indication(s) ?Medication administration and Difficult access ? ?Consent ?Unable to obtain consent due to emergent nature of procedure. ? ?Anesthesia ?Topical only with 1% lidocaine  ? ?Timeout ?Verified patient identification, verified procedure, site/side was marked, verified correct patient position, special equipment/implants available, medications/allergies/relevant history reviewed, required imaging and test results available. ? ?Sterile Technique ?Maximal sterile technique including full sterile barrier drape, hand hygiene, sterile gown, sterile gloves, mask, hair covering, sterile ultrasound probe cover (if used). ? ?Procedure Description ?Area of catheter insertion was cleaned with chlorhexidine and draped in sterile fashion.  With real-time ultrasound guidance a 7 French 20 cm central venous catheter was placed into the left subclavian vein. Nonpulsatile blood flow and easy flushing noted in all ports.  The catheter was sutured in place and sterile dressing applied. ? ?Complications/Tolerance ?None; patient tolerated the procedure well. ?Chest X-ray is ordered to verify placement for internal jugular or subclavian cannulation.   Chest x-ray is not ordered for femoral cannulation. ? ?EBL ?Minimal ? ?Specimen(s) ?None  ? ?Stanley Brood, MD FRCPC ?ICU Physician ?[place]  ?Pager: [phone] ?Or Epic Secure Chat ?After hours: [phone]. ? ?11/02/2021, 4:49 PM ? ? ? ? ?

## 2021-11-03 ENCOUNTER — Inpatient Hospital Stay (HOSPITAL_COMMUNITY): Payer: Commercial Managed Care - HMO

## 2021-11-03 DIAGNOSIS — E870 Hyperosmolality and hypernatremia: Secondary | ICD-10-CM | POA: Diagnosis not present

## 2021-11-03 DIAGNOSIS — R009 Unspecified abnormalities of heart beat: Secondary | ICD-10-CM

## 2021-11-03 DIAGNOSIS — R011 Cardiac murmur, unspecified: Secondary | ICD-10-CM

## 2021-11-03 DIAGNOSIS — E1111 Type 2 diabetes mellitus with ketoacidosis with coma: Secondary | ICD-10-CM | POA: Diagnosis not present

## 2021-11-03 DIAGNOSIS — E7800 Pure hypercholesterolemia, unspecified: Secondary | ICD-10-CM | POA: Diagnosis not present

## 2021-11-03 DIAGNOSIS — N179 Acute kidney failure, unspecified: Secondary | ICD-10-CM | POA: Diagnosis not present

## 2021-11-03 DIAGNOSIS — E876 Hypokalemia: Secondary | ICD-10-CM | POA: Diagnosis not present

## 2021-11-03 LAB — PROCALCITONIN: Procalcitonin: 1.98 ng/mL

## 2021-11-03 LAB — BASIC METABOLIC PANEL
Anion gap: 15 (ref 5–15)
Anion gap: 8 (ref 5–15)
Anion gap: 9 (ref 5–15)
BUN: 40 mg/dL — ABNORMAL HIGH (ref 6–20)
BUN: 46 mg/dL — ABNORMAL HIGH (ref 6–20)
BUN: 53 mg/dL — ABNORMAL HIGH (ref 6–20)
CO2: 20 mmol/L — ABNORMAL LOW (ref 22–32)
CO2: 24 mmol/L (ref 22–32)
CO2: 26 mmol/L (ref 22–32)
Calcium: 8.5 mg/dL — ABNORMAL LOW (ref 8.9–10.3)
Calcium: 8.7 mg/dL — ABNORMAL LOW (ref 8.9–10.3)
Calcium: 8.7 mg/dL — ABNORMAL LOW (ref 8.9–10.3)
Chloride: 114 mmol/L — ABNORMAL HIGH (ref 98–111)
Chloride: 116 mmol/L — ABNORMAL HIGH (ref 98–111)
Chloride: 117 mmol/L — ABNORMAL HIGH (ref 98–111)
Creatinine, Ser: 1.82 mg/dL — ABNORMAL HIGH (ref 0.61–1.24)
Creatinine, Ser: 2.2 mg/dL — ABNORMAL HIGH (ref 0.61–1.24)
Creatinine, Ser: 2.98 mg/dL — ABNORMAL HIGH (ref 0.61–1.24)
GFR, Estimated: 24 mL/min — ABNORMAL LOW (ref >60)
GFR, Estimated: 34 mL/min — ABNORMAL LOW (ref >60)
GFR, Estimated: 43 mL/min — ABNORMAL LOW (ref >60)
Glucose, Bld: 181 mg/dL — ABNORMAL HIGH (ref 70–99)
Glucose, Bld: 223 mg/dL — ABNORMAL HIGH (ref 70–99)
Glucose, Bld: 277 mg/dL — ABNORMAL HIGH (ref 70–99)
Potassium: 2.9 mmol/L — ABNORMAL LOW (ref 3.5–5.1)
Potassium: 3.2 mmol/L — ABNORMAL LOW (ref 3.5–5.1)
Potassium: 3.9 mmol/L (ref 3.5–5.1)
Sodium: 149 mmol/L — ABNORMAL HIGH (ref 135–145)
Sodium: 149 mmol/L — ABNORMAL HIGH (ref 135–145)
Sodium: 151 mmol/L — ABNORMAL HIGH (ref 135–145)

## 2021-11-03 LAB — ECHOCARDIOGRAM COMPLETE
Area-P 1/2: 3.84 cm2
Calc EF: 63.7 %
Height: 74.016 in
S' Lateral: 2.6 cm
Single Plane A2C EF: 67.8 %
Single Plane A4C EF: 53.5 %
Weight: 2726.65 oz

## 2021-11-03 LAB — GLUCOSE, CAPILLARY
Glucose-Capillary: 111 mg/dL — ABNORMAL HIGH (ref 70–99)
Glucose-Capillary: 135 mg/dL — ABNORMAL HIGH (ref 70–99)
Glucose-Capillary: 166 mg/dL — ABNORMAL HIGH (ref 70–99)
Glucose-Capillary: 189 mg/dL — ABNORMAL HIGH (ref 70–99)
Glucose-Capillary: 195 mg/dL — ABNORMAL HIGH (ref 70–99)
Glucose-Capillary: 202 mg/dL — ABNORMAL HIGH (ref 70–99)
Glucose-Capillary: 225 mg/dL — ABNORMAL HIGH (ref 70–99)
Glucose-Capillary: 253 mg/dL — ABNORMAL HIGH (ref 70–99)
Glucose-Capillary: 257 mg/dL — ABNORMAL HIGH (ref 70–99)
Glucose-Capillary: 273 mg/dL — ABNORMAL HIGH (ref 70–99)
Glucose-Capillary: 282 mg/dL — ABNORMAL HIGH (ref 70–99)
Glucose-Capillary: 334 mg/dL — ABNORMAL HIGH (ref 70–99)

## 2021-11-03 LAB — MAGNESIUM
Magnesium: 2.1 mg/dL (ref 1.7–2.4)
Magnesium: 2 mg/dL (ref 1.7–2.4)

## 2021-11-03 LAB — VANCOMYCIN, RANDOM: Vancomycin Rm: 11

## 2021-11-03 LAB — BETA-HYDROXYBUTYRIC ACID
Beta-Hydroxybutyric Acid: 0.97 mmol/L — ABNORMAL HIGH (ref 0.05–0.27)
Beta-Hydroxybutyric Acid: 3.56 mmol/L — ABNORMAL HIGH (ref 0.05–0.27)

## 2021-11-03 MED ORDER — VANCOMYCIN HCL IN DEXTROSE 1-5 GM/200ML-% IV SOLN
1000.0000 mg | INTRAVENOUS | Status: DC
  Administered 2021-11-03: 1000 mg via INTRAVENOUS
  Filled 2021-11-03: qty 200

## 2021-11-03 MED ORDER — INSULIN GLARGINE-YFGN 100 UNIT/ML ~~LOC~~ SOLN
20.0000 [IU] | Freq: Every day | SUBCUTANEOUS | Status: DC
  Administered 2021-11-04 – 2021-11-06 (×3): 20 [IU] via SUBCUTANEOUS
  Filled 2021-11-03 (×3): qty 0.2

## 2021-11-03 MED ORDER — POTASSIUM CHLORIDE 10 MEQ/100ML IV SOLN
10.0000 meq | INTRAVENOUS | Status: AC
  Administered 2021-11-03 (×6): 10 meq via INTRAVENOUS
  Filled 2021-11-03 (×6): qty 100

## 2021-11-03 MED ORDER — INSULIN GLARGINE-YFGN 100 UNIT/ML ~~LOC~~ SOLN
20.0000 [IU] | Freq: Every day | SUBCUTANEOUS | Status: DC
  Administered 2021-11-03: 20 [IU] via SUBCUTANEOUS
  Filled 2021-11-03: qty 0.2

## 2021-11-03 MED ORDER — INSULIN ASPART 100 UNIT/ML IJ SOLN
0.0000 [IU] | Freq: Three times a day (TID) | INTRAMUSCULAR | Status: DC
  Administered 2021-11-03 (×2): 8 [IU] via SUBCUTANEOUS
  Administered 2021-11-04: 2 [IU] via SUBCUTANEOUS

## 2021-11-03 MED ORDER — INSULIN GLARGINE-YFGN 100 UNIT/ML ~~LOC~~ SOLN: 30.0000 [IU] | Freq: Every day | SUBCUTANEOUS | Status: DC

## 2021-11-03 MED ORDER — CHLORHEXIDINE GLUCONATE CLOTH 2 % EX PADS
6.0000 | MEDICATED_PAD | Freq: Every day | CUTANEOUS | Status: DC
  Administered 2021-11-02 – 2021-11-07 (×5): 6 via TOPICAL

## 2021-11-03 MED ORDER — INSULIN GLARGINE-YFGN 100 UNIT/ML ~~LOC~~ SOLN
12.0000 [IU] | Freq: Every day | SUBCUTANEOUS | Status: DC
  Filled 2021-11-03: qty 0.12

## 2021-11-03 MED ORDER — INSULIN ASPART 100 UNIT/ML IJ SOLN: 0.0000 [IU] | Freq: Every day | INTRAMUSCULAR | Status: DC

## 2021-11-03 MED ORDER — LACTATED RINGERS IV SOLN: INTRAVENOUS | Status: DC

## 2021-11-03 NOTE — Progress Notes (Signed)
eLink Physician-Brief Progress Note ?Patient Name: Stanley Edwards ?DOB: [birth year] ?MRN: [id number] ? ? ?Date of Service ? 11/03/2021  ?HPI/Events of Note ? Notified of K 2.9  ?eICU Interventions ? Ordered potassium 10 meqs x 6 doses  ? ? ? ?Intervention Category ?Intermediate Interventions: Electrolyte abnormality - evaluation and management ? ?Shona Needles Amour Cutrone ?11/03/2021, 1:01 AM ?

## 2021-11-03 NOTE — Assessment & Plan Note (Signed)
K has dropped with fluid shifts to 3.2 ? ?- replete IV by continuous infusions and bolus runs until able to take PO ?

## 2021-11-03 NOTE — Hospital Course (Signed)
4/8-admission for severe DKA ?4/9 - Glucose normalized and gap closed. Mild hypernatremia. Still encephalopathic.  ?
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
## 2021-11-03 NOTE — Progress Notes (Signed)
Inpatient Diabetes Program Recommendations ? ?AACE/ADA: New Consensus Statement on Inpatient Glycemic Control (2015) ? ?Target Ranges:  Prepandial:   less than 140 mg/dL ?     Peak postprandial:   less than 180 mg/dL (1-2 hours) ?     Critically ill patients:  140 - 180 mg/dL  ? ?Lab Results  ?Component Value Date  ? GLUCAP 166 (H) 11/03/2021  ? HGBA1C 11.8 (H) 11/02/2021  ? ? ?Review of Glycemic Control ? ?Diabetes history: DM2 ?Outpatient Diabetes medications: 70/30 20 units BID ?Current orders for Inpatient glycemic control: Transitioning from IV insulin to Semglee 20 QD and Novolog 0-15 units TID and 0-5 units QHS ? ?Received referral for DM management evaluation.  Agree with current orders.  Not appropriate for education at this time.  Will follow up when alert and oriented.   ? ?Will continue to follow while inpatient. ? ?Thank you, ?Reche Dixon, MSN, RN ?Diabetes Coordinator ?Inpatient Diabetes Program ?[phone] (team pager from 8a-5p) ? ? ? ? ? ?

## 2021-11-03 NOTE — Assessment & Plan Note (Signed)
Sodium up to 151 ? ?- Continue hypotonic iv fluids and serial BMET. ?

## 2021-11-03 NOTE — Progress Notes (Signed)
Pharmacy Antibiotic Note ? ?Stanley Edwards is a 56 y.o. male for which pharmacy has been consulted for vancomycin dosing for sepsis. ? ?Patient with a history of DM and HTN. Patient presenting unresponsive with DKA. Patient had markedly elevated Scr with AKI on admission. Renal function has improved, with CrCl of 41. Vancomycin random was 11, so Vancomycin will be redosed. Procalcitonin is elevated. WBC elevated on admission, no CBC today. Patient is afebrile and HR remains elevated at 108. ? ? ?Plan: ?Metronidazole per MD ?Ceftriaxone per MD ?Vancomycin 1000 mg every 24 hours for [facility] of 443 using Scr 2.2 ?Will monitor renal function closely and alter dose as AKI resolves ?Trend WBC, Fever, Renal function, & Clinical course ?F/u cultures, clinical course, WBC, fever ?De-escalate when able ?Levels at steady state ? ?Height: 6' 2.02" (188 cm) ?Weight: 77.3 kg (170 lb 6.7 oz) ?IBW/kg (Calculated) : 82.24 ? ?Temp (24hrs), Avg:97.3 ?F (36.3 ?C), Min:96.5 ?F (35.8 ?C), Max:98.2 ?F (36.8 ?C) ? ?Recent Labs  ?Lab 11/02/21 ?1431 11/02/21 ?1434 11/02/21 ?1434 11/02/21 ?1444 11/02/21 ?1615 11/02/21 ?1631 11/02/21 ?2047 11/02/21 ?2359 11/03/21 ?0350 11/03/21 ?1884  ?WBC  --  16.3*  --   --  13.0*  --   --   --   --   --   ?CREATININE  --  4.50*   < > 3.80* 4.15*  --  3.48* 2.98* 2.20*  --   ?LATICACIDVEN 4.7*  --   --   --   --  2.5*  --   --   --   --   ?VANCORANDOM  --   --   --   --   --   --   --   --   --  11  ? < > = values in this interval not displayed.  ? ?  ?Estimated Creatinine Clearance: 41 mL/min (A) (by C-G formula based on SCr of 2.2 mg/dL (H)).   ? ?No Known Allergies ? ?Antimicrobials this admission: ?ceftriaxone 4/8 >>  ?vancomycin 4/8 >> ?Metronidazole 4/8 >> ? ?Microbiology results: ?MRSA PCR negative ? ?Thank you for allowing pharmacy to participate in this patient's care. ? ?Enos Fling, PharmD ?PGY1 Pharmacy Resident ?11/03/2021 7:13 AM ?Check [email or web address] for unit specific pharmacy number ? ?  ?

## 2021-11-03 NOTE — Progress Notes (Signed)
?  Echocardiogram ?2D Echocardiogram has been performed. ? ?Stanley Edwards ?11/03/2021, 9:16 AM ?

## 2021-11-04 DIAGNOSIS — N179 Acute kidney failure, unspecified: Secondary | ICD-10-CM | POA: Diagnosis not present

## 2021-11-04 DIAGNOSIS — E7800 Pure hypercholesterolemia, unspecified: Secondary | ICD-10-CM | POA: Diagnosis not present

## 2021-11-04 DIAGNOSIS — E1110 Type 2 diabetes mellitus with ketoacidosis without coma: Secondary | ICD-10-CM

## 2021-11-04 DIAGNOSIS — E1111 Type 2 diabetes mellitus with ketoacidosis with coma: Secondary | ICD-10-CM | POA: Diagnosis not present

## 2021-11-04 DIAGNOSIS — E876 Hypokalemia: Secondary | ICD-10-CM

## 2021-11-04 DIAGNOSIS — E870 Hyperosmolality and hypernatremia: Secondary | ICD-10-CM

## 2021-11-04 LAB — GLUCOSE, CAPILLARY
Glucose-Capillary: 111 mg/dL — ABNORMAL HIGH (ref 70–99)
Glucose-Capillary: 144 mg/dL — ABNORMAL HIGH (ref 70–99)
Glucose-Capillary: 228 mg/dL — ABNORMAL HIGH (ref 70–99)
Glucose-Capillary: 240 mg/dL — ABNORMAL HIGH (ref 70–99)
Glucose-Capillary: 266 mg/dL — ABNORMAL HIGH (ref 70–99)

## 2021-11-04 LAB — PROCALCITONIN: Procalcitonin: 0.92 ng/mL

## 2021-11-04 LAB — BASIC METABOLIC PANEL
Anion gap: 9 (ref 5–15)
Anion gap: 8 (ref 5–15)
BUN: 22 mg/dL — ABNORMAL HIGH (ref 6–20)
BUN: 21 mg/dL — ABNORMAL HIGH (ref 6–20)
CO2: 24 mmol/L (ref 22–32)
CO2: 24 mmol/L (ref 22–32)
Calcium: 8.6 mg/dL — ABNORMAL LOW (ref 8.9–10.3)
Calcium: 8.4 mg/dL — ABNORMAL LOW (ref 8.9–10.3)
Chloride: 119 mmol/L — ABNORMAL HIGH (ref 98–111)
Chloride: 111 mmol/L (ref 98–111)
Creatinine, Ser: 1.31 mg/dL — ABNORMAL HIGH (ref 0.61–1.24)
Creatinine, Ser: 1.28 mg/dL — ABNORMAL HIGH (ref 0.61–1.24)
GFR, Estimated: >60 mL/min (ref >60)
GFR, Estimated: >60 mL/min (ref >60)
Glucose, Bld: 145 mg/dL — ABNORMAL HIGH (ref 70–99)
Glucose, Bld: 265 mg/dL — ABNORMAL HIGH (ref 70–99)
Potassium: 3.3 mmol/L — ABNORMAL LOW (ref 3.5–5.1)
Potassium: 3.8 mmol/L (ref 3.5–5.1)
Sodium: 152 mmol/L — ABNORMAL HIGH (ref 135–145)
Sodium: 143 mmol/L (ref 135–145)

## 2021-11-04 LAB — MAGNESIUM: Magnesium: 2 mg/dL (ref 1.7–2.4)

## 2021-11-04 LAB — CBC
HCT: 28.3 % — ABNORMAL LOW (ref 39.0–52.0)
Hemoglobin: 9.5 g/dL — ABNORMAL LOW (ref 13.0–17.0)
MCH: 35.7 pg — ABNORMAL HIGH (ref 26.0–34.0)
MCHC: 33.6 g/dL (ref 30.0–36.0)
MCV: 106.4 fL — ABNORMAL HIGH (ref 80.0–100.0)
Platelets: 164 10*3/uL (ref 150–400)
RBC: 2.66 MIL/uL — ABNORMAL LOW (ref 4.22–5.81)
RDW: 12.2 % (ref 11.5–15.5)
WBC: 10.3 10*3/uL (ref 4.0–10.5)
nRBC: 0 % (ref 0.0–0.2)

## 2021-11-04 MED ORDER — DEXTROSE 5 % IV SOLN: INTRAVENOUS | Status: DC

## 2021-11-04 MED ORDER — LIVING WELL WITH DIABETES BOOK
Freq: Once | Status: AC
  Filled 2021-11-04: qty 1

## 2021-11-04 MED ORDER — INSULIN ASPART 100 UNIT/ML IJ SOLN
3.0000 [IU] | Freq: Three times a day (TID) | INTRAMUSCULAR | Status: DC
  Administered 2021-11-04 – 2021-11-05 (×3): 3 [IU] via SUBCUTANEOUS

## 2021-11-04 MED ORDER — INSULIN ASPART 100 UNIT/ML IJ SOLN
0.0000 [IU] | Freq: Every day | INTRAMUSCULAR | Status: DC
  Administered 2021-11-04: 3 [IU] via SUBCUTANEOUS
  Administered 2021-11-06: 2 [IU] via SUBCUTANEOUS

## 2021-11-04 MED ORDER — ATORVASTATIN CALCIUM 10 MG PO TABS
20.0000 mg | ORAL_TABLET | Freq: Every day | ORAL | Status: DC
  Administered 2021-11-04 – 2021-11-07 (×4): 20 mg via ORAL
  Filled 2021-11-04 (×4): qty 2

## 2021-11-04 MED ORDER — INSULIN ASPART 100 UNIT/ML IJ SOLN
0.0000 [IU] | Freq: Three times a day (TID) | INTRAMUSCULAR | Status: DC
  Administered 2021-11-04 (×2): 3 [IU] via SUBCUTANEOUS
  Administered 2021-11-05: 9 [IU] via SUBCUTANEOUS
  Administered 2021-11-05: 2 [IU] via SUBCUTANEOUS
  Administered 2021-11-06: 3 [IU] via SUBCUTANEOUS
  Administered 2021-11-06 (×2): 2 [IU] via SUBCUTANEOUS
  Administered 2021-11-07: 7 [IU] via SUBCUTANEOUS
  Administered 2021-11-07: 2 [IU] via SUBCUTANEOUS

## 2021-11-04 MED ORDER — POTASSIUM CHLORIDE CRYS ER 20 MEQ PO TBCR
20.0000 meq | EXTENDED_RELEASE_TABLET | ORAL | Status: AC
  Administered 2021-11-04 (×2): 20 meq via ORAL
  Filled 2021-11-04 (×2): qty 1

## 2021-11-04 MED ORDER — POTASSIUM CHLORIDE 10 MEQ/100ML IV SOLN
10.0000 meq | INTRAVENOUS | Status: AC
  Administered 2021-11-04 (×4): 10 meq via INTRAVENOUS
  Filled 2021-11-04 (×4): qty 100

## 2021-11-04 NOTE — Progress Notes (Signed)
eLink Physician-Brief Progress Note ?Patient Name: Stanley Edwards ?DOB: 1965/11/12 ?MRN: [id number] ? ? ?Date of Service ? 11/04/2021  ?HPI/Events of Note ? Pt requesting diet.  ?Pt awake, alert, oriented.  ?Has been transitioned off insulin drip and is now on [place] insulin.   ?eICU Interventions ? Placed order for diabetic diet.  ?Will monitor closely. If with signs of aspiration, will place back on NPO and plan for a formal speech evaluation in AM.  ?Continue to monitor serial glucose. Will adjust insulin regimen as warranted.   ? ? ? ?  ? ?Thurnell Garbe CRUZ ?11/04/2021, 12:51 AM ?

## 2021-11-04 NOTE — Progress Notes (Signed)
? ?NAME:  Stanley Edwards, MRN:  [id number], DOB:  08-11-1965, LOS: 2 ?ADMISSION DATE:  11/02/2021, CONSULTATION DATE: 11/02/2021 ?REFERRING MD: Ronald Lobo, ED.  CHIEF COMPLAINT: Diabetic ketoacidosis ? ?History of Present Illness:  ?56 year old man who was brought to the ED via EMS.  Sister heard a thump and found him brother unresponsive with a GCS of of apparently 4.  CBG was initially 495.  Able to verbalize in the ED with incomprehensible words.  No signs of trauma.  Last dose of insulin was yesterday. ? ?Several recent admissions approximately every 6 weeks with diabetic ketoacidosis since December.  Apparently had reasonably good control prior to this.  Unclear what has changed in his social situation to account for this. ? ?Pertinent  Medical History  ? ?Past Medical History:  ?Diagnosis Date  ? Allergy   ? Diabetes mellitus without complication ([place])   ? Hypertension   ? Pt denies htn but it was on his previous hx  ? ?History reviewed. No pertinent surgical history. ? ? ?Significant Hospital Events: ?Including procedures, antibiotic start and stop dates in addition to other pertinent events   ?4/8-admission for severe DKA ?4/9 - Glucose normalized and gap closed. Mild hypernatremia.  ? ?Interim History / Subjective:  ?Feeling well, denies complaints. Given diet overnight, not particularly thirsty. ? ?Objective   ?Blood pressure (!) 116/98, pulse (!) 107, temperature 99.1 ?F (37.3 ?C), temperature source Axillary, resp. rate (!) 22, height 6' 2.02" (1.88 m), weight 77.3 kg, SpO2 98 %. ?   ?   ? ?Intake/Output Summary (Last 24 hours) at 11/04/2021 0738 ?Last data filed at 11/04/2021 0700 ?Gross per 24 hour  ?Intake 3566.3 ml  ?Output 1300 ml  ?Net 2266.3 ml  ? ? ?Filed Weights  ? 11/02/21 1415 11/02/21 2000 11/03/21 0400  ?Weight: 77.1 kg 76.3 kg 77.3 kg  ? ? ?Examination: ?General: Middle-aged man lying in bed in no acute distress ?HENT: [place]/AT, eyes anicteric ?Lungs: Breathing comfortably on room air,  CTA B ?Cardiovascular: S1-S2, regular rate and rhythm ?Abdomen: Soft, nontender ?Extremities: Edema, no foot wounds ?Neuro: Arouses easily to verbal stimulation, moving around independently, answering questions appropriately with normal speech. ?Derm: No erythema around L subclavian ? ?Wbc 10.3 ?H/H 9.5/28.3 ?Na+ 152 ?K+ 3.3 ?BUN 22 ?Cr 1.31 ?No cultures ? ?Resolved problem list:  ? ?Acute metabolic encephalopathy ?Assessment & Plan:  ?Hypovolemic shock -resolve. Due to dehydration for osmotic diuresis from diabetic ketoacidosis. ?-DC maintenance fluids.  Starting D5W to correct sodium. ?- Oral rehydration ?-Discontinue antibiotics if no source of infection has been found ? ?Diabetic ketoacidosis without coma associated with type 2 diabetes mellitus ([place]) ?Recurrent admissions for DKA (approximately 1 once every 6 weeks)  ?-Diabetic diet ?- Basal bolus insulin ?- Sliding scale insulin as needed ?- Goal BG 140-180 ? ?AKI (acute kidney injury) ?-Strict I's/O ?- Monitor ?- Renally dose meds and avoid nephrotoxic meds ? ?DM2 (diabetes mellitus, type 2), A1c 11.8 ?Patient with recurrent episodes of DKA since December 2022.  Had previously done well with a hemoglobin A1c of 6.2% as of August. ?-Needs endocrine referral on discharge; may be a good candidate for a pump & CGM ?-Consult diabetes educator today ? ?Hypertension ?Continue holding PTA antihypertensives with low normal blood pressures ? ?Hyperlipidemia ?-Start PTA atorvastatin 20 mg daily ? ?Hypernatremia, Sodium up to 152 ?-D5W at 50 cc/h ?- Encouraged him to orally rehydrate ?- Repeat BMP this afternoon ? ?Hypokalemia ?-Repleted ?- Recheck this afternoon ? ? ?Likely stable to  transfer to the floor this afternoon. ? ? ?Best Practice (right click and "Reselect all SmartList Selections" daily)  ? ?Diet/type: Regular consistency (see orders)  ?DVT prophylaxis: prophylactic heparin  ?GI prophylaxis: N/A ?Lines: Central line-- removing today after afternoon  labs ?Foley:  N/A ?Code Status:  full code ?Last date of multidisciplinary goals of care discussion [ ]  ? ? ?Julian Hy, DO 11/04/21 7:44 AM ? Pulmonary & Critical Care ? ?

## 2021-11-04 NOTE — Progress Notes (Addendum)
Inpatient Diabetes Program Recommendations ? ?AACE/ADA: New Consensus Statement on Inpatient Glycemic Control (2015) ? ?Target Ranges:  Prepandial:   less than 140 mg/dL ?     Peak postprandial:   less than 180 mg/dL (1-2 hours) ?     Critically ill patients:  140 - 180 mg/dL  ? ?Lab Results  ?Component Value Date  ? GLUCAP 240 (H) 11/04/2021  ? HGBA1C 11.8 (H) 11/02/2021  ? ? ?Review of Glycemic Control ? ?Diabetes history: DM2 ?Outpatient Diabetes medications: 70/30 20 units bid ac meals ?Current orders for Inpatient glycemic control: Semglee 20 units qd, Novolog 0-15 units tid correction , 0-5 units hs ? ?Inpatient Diabetes Program Recommendations:   ? ?Plan to speak with patient today regarding recurrent DKA admissions. ?-Add Novolog 3-4 units tid meal coverage if eats 50% meals ?-Decrease Novolog correction to 0-9 units tid, 0-5 units hs ?Secure chat sent to Dr. Carlis Abbott. ? ?Spoke with patient @ bedside to discuss elevated A1c (average blood glucose approximately 290 over the past 2-3 months) and evaluate if taking insulin as prescribed. Patient states he is taking 70/30 20 units bid ac meals as prescribed. ?Patient states "I'm just not eating right. I'm eating things that raise my blood sugar". Patient agrees to check CBGs qid and review nutrition and make changes as needed to decrease blood glucose. ? ?Thank you, ?Nani Gasser Zaleigh Bermingham, RN, MSN, CDE  ?Diabetes Coordinator ?Inpatient Glycemic Control Team ?Team Pager [phone] (8am-5pm) ?11/04/2021 11:30 AM ? ? ? ? ?

## 2021-11-04 NOTE — Progress Notes (Signed)
?  Transition of Care (TOC) Screening Note ? ? ?Patient Details  ?Name: Stanley Edwards ?Date of Birth: 1966-06-04 ? ? ?Transition of Care (TOC) CM/SW Contact:    ?Harriet Masson, RN ?Phone Number: ?11/04/2021, 8:20 AM ? ? ? ?Transition of Care Department [facility]) has reviewed patient and no TOC needs have been identified at this time. We will continue to monitor patient advancement through interdisciplinary progression rounds. If new patient transition needs arise, please place a TOC consult. ? ? ?

## 2021-11-05 ENCOUNTER — Encounter: Payer: Self-pay | Admitting: Nurse Practitioner

## 2021-11-05 ENCOUNTER — Encounter: Payer: Managed Care, Other (non HMO) | Admitting: Nurse Practitioner

## 2021-11-05 DIAGNOSIS — E872 Acidosis, unspecified: Secondary | ICD-10-CM

## 2021-11-05 DIAGNOSIS — G9341 Metabolic encephalopathy: Secondary | ICD-10-CM

## 2021-11-05 DIAGNOSIS — N179 Acute kidney failure, unspecified: Secondary | ICD-10-CM | POA: Diagnosis not present

## 2021-11-05 DIAGNOSIS — E0810 Diabetes mellitus due to underlying condition with ketoacidosis without coma: Secondary | ICD-10-CM

## 2021-11-05 DIAGNOSIS — E440 Moderate protein-calorie malnutrition: Secondary | ICD-10-CM

## 2021-11-05 DIAGNOSIS — R571 Hypovolemic shock: Secondary | ICD-10-CM

## 2021-11-05 DIAGNOSIS — E1110 Type 2 diabetes mellitus with ketoacidosis without coma: Secondary | ICD-10-CM | POA: Diagnosis not present

## 2021-11-05 DIAGNOSIS — I10 Essential (primary) hypertension: Secondary | ICD-10-CM

## 2021-11-05 LAB — CBC WITH DIFFERENTIAL/PLATELET
Abs Immature Granulocytes: 0.03 10*3/uL (ref 0.00–0.07)
Basophils Absolute: 0 10*3/uL (ref 0.0–0.1)
Basophils Relative: 1 %
Eosinophils Absolute: 0.2 10*3/uL (ref 0.0–0.5)
Eosinophils Relative: 2 %
HCT: 27.6 % — ABNORMAL LOW (ref 39.0–52.0)
Hemoglobin: 9 g/dL — ABNORMAL LOW (ref 13.0–17.0)
Immature Granulocytes: 0 %
Lymphocytes Relative: 16 %
Lymphs Abs: 1.4 10*3/uL (ref 0.7–4.0)
MCH: 35.2 pg — ABNORMAL HIGH (ref 26.0–34.0)
MCHC: 32.6 g/dL (ref 30.0–36.0)
MCV: 107.8 fL — ABNORMAL HIGH (ref 80.0–100.0)
Monocytes Absolute: 0.3 10*3/uL (ref 0.1–1.0)
Monocytes Relative: 4 %
Neutro Abs: 6.5 10*3/uL (ref 1.7–7.7)
Neutrophils Relative %: 77 %
Platelets: 143 10*3/uL — ABNORMAL LOW (ref 150–400)
RBC: 2.56 MIL/uL — ABNORMAL LOW (ref 4.22–5.81)
RDW: 12.3 % (ref 11.5–15.5)
WBC: 8.4 10*3/uL (ref 4.0–10.5)
nRBC: 0 % (ref 0.0–0.2)

## 2021-11-05 LAB — GLUCOSE, CAPILLARY
Glucose-Capillary: 195 mg/dL — ABNORMAL HIGH (ref 70–99)
Glucose-Capillary: 364 mg/dL — ABNORMAL HIGH (ref 70–99)
Glucose-Capillary: 116 mg/dL — ABNORMAL HIGH (ref 70–99)
Glucose-Capillary: 90 mg/dL (ref 70–99)

## 2021-11-05 LAB — COMPREHENSIVE METABOLIC PANEL
ALT: 33 U/L (ref 0–44)
AST: 35 U/L (ref 15–41)
Albumin: 3 g/dL — ABNORMAL LOW (ref 3.5–5.0)
Alkaline Phosphatase: 63 U/L (ref 38–126)
Anion gap: 6 (ref 5–15)
BUN: 13 mg/dL (ref 6–20)
CO2: 29 mmol/L (ref 22–32)
Calcium: 8.7 mg/dL — ABNORMAL LOW (ref 8.9–10.3)
Chloride: 108 mmol/L (ref 98–111)
Creatinine, Ser: 1.04 mg/dL (ref 0.61–1.24)
GFR, Estimated: >60 mL/min (ref >60)
Glucose, Bld: 210 mg/dL — ABNORMAL HIGH (ref 70–99)
Potassium: 3.2 mmol/L — ABNORMAL LOW (ref 3.5–5.1)
Sodium: 143 mmol/L (ref 135–145)
Total Bilirubin: 1.6 mg/dL — ABNORMAL HIGH (ref 0.3–1.2)
Total Protein: 6 g/dL — ABNORMAL LOW (ref 6.5–8.1)

## 2021-11-05 LAB — PHOSPHORUS: Phosphorus: 1.5 mg/dL — ABNORMAL LOW (ref 2.5–4.6)

## 2021-11-05 LAB — MAGNESIUM: Magnesium: 1.8 mg/dL (ref 1.7–2.4)

## 2021-11-05 MED ORDER — INSULIN ASPART 100 UNIT/ML IJ SOLN: 5.0000 [IU] | Freq: Three times a day (TID) | INTRAMUSCULAR | Status: DC

## 2021-11-05 MED ORDER — K PHOS MONO-SOD PHOS DI & MONO 155-852-130 MG PO TABS
250.0000 mg | ORAL_TABLET | Freq: Once | ORAL | Status: AC
  Administered 2021-11-05: 250 mg via ORAL
  Filled 2021-11-05: qty 1

## 2021-11-05 MED ORDER — POTASSIUM PHOSPHATES 15 MMOLE/5ML IV SOLN
30.0000 mmol | Freq: Once | INTRAVENOUS | Status: DC
  Filled 2021-11-05: qty 10

## 2021-11-05 MED ORDER — ENSURE MAX PROTEIN PO LIQD
11.0000 [oz_av] | Freq: Two times a day (BID) | ORAL | Status: DC
  Administered 2021-11-05 – 2021-11-07 (×4): 11 [oz_av] via ORAL
  Filled 2021-11-05 (×5): qty 330

## 2021-11-05 MED ORDER — INSULIN ASPART 100 UNIT/ML IJ SOLN
10.0000 [IU] | Freq: Three times a day (TID) | INTRAMUSCULAR | Status: DC
  Administered 2021-11-05 – 2021-11-07 (×6): 10 [IU] via SUBCUTANEOUS

## 2021-11-05 MED ORDER — ADULT MULTIVITAMIN W/MINERALS CH
1.0000 | ORAL_TABLET | Freq: Every day | ORAL | Status: DC
  Administered 2021-11-05 – 2021-11-07 (×3): 1 via ORAL
  Filled 2021-11-05 (×3): qty 1

## 2021-11-05 NOTE — Evaluation (Signed)
Occupational Therapy Evaluation ?Patient Details ?Name: Stanley Edwards ?MRN: [id number] ?DOB: Jul 07, 1966 ?Today's Date: 11/05/2021 ? ? ?History of Present Illness 56 yo male presenting to ED on 4/8 after being found unresponsive at home. On arrival to ED, CBG greater than 600. PMH including diabetes, hypertension, and prior admission for DKA.  ? ?Clinical Impression ?  ?PTA, pt was living with his sister and was independent. Currently, pt performing ADLs and functional mobility at Mod I level. Pt participating in toileting, mobility in hallway, and stair management without difficulty. HR elevating to 120-130s but no signs of fatigue or SOB. Discussed taking rest breaks at home as needed. Answered all pt questions. Recommend dc home once medically stable per physician. All acute OT needs met and will sign off. Thank you.   ? ?Recommendations for follow up therapy are one component of a multi-disciplinary discharge planning process, led by the attending physician.  Recommendations may be updated based on patient status, additional functional criteria and insurance authorization.  ? ?Follow Up Recommendations ? No OT follow up  ?  ?Assistance Recommended at Discharge PRN  ?Patient can return home with the following   ? ?  ?Functional Status Assessment ?    ?Equipment Recommendations ? None recommended by OT  ?  ?Recommendations for Other Services   ? ? ?  ?Precautions / Restrictions Precautions ?Precautions: None ?Restrictions ?Weight Bearing Restrictions: No  ? ?  ? ?Mobility Bed Mobility ?Overal bed mobility: Independent ?  ?  ?  ?  ?  ?  ?  ?  ? ?Transfers ?Overall transfer level: Independent ?  ?  ?  ?  ?  ?  ?  ?  ?  ?  ? ?  ?Balance Overall balance assessment: No apparent balance deficits (not formally assessed) ?  ?  ?  ?  ?  ?  ?  ?  ?  ?  ?  ?  ?  ?  ?  ?  ?  ?  ?   ? ?ADL either performed or assessed with clinical judgement  ? ?ADL Overall ADL's : Modified independent ?  ?  ?  ?  ?  ?  ?  ?  ?  ?  ?  ?  ?  ?  ?   ?  ?  ?  ?  ?General ADL Comments: Pt performing at Mod I level including toileting, mobility in hallway, and stair management. Discussed taking rest breaks as needed when home.  ? ? ? ?Vision   ?   ?   ?Perception   ?  ?Praxis   ?  ? ?Pertinent Vitals/Pain Pain Assessment ?Pain Assessment: No/denies pain  ? ? ? ?Hand Dominance   ?  ?Extremity/Trunk Assessment Upper Extremity Assessment ?Upper Extremity Assessment: Overall WFL for tasks assessed ?  ?Lower Extremity Assessment ?Lower Extremity Assessment: Overall WFL for tasks assessed ?  ?Cervical / Trunk Assessment ?Cervical / Trunk Assessment: Normal ?  ?Communication Communication ?Communication: No difficulties ?  ?Cognition Arousal/Alertness: Awake/alert ?Behavior During Therapy: [facility] for tasks assessed/performed ?Overall Cognitive Status: Within Functional Limits for tasks assessed ?  ?  ?  ?  ?  ?  ?  ?  ?  ?  ?  ?  ?  ?  ?  ?  ?General Comments: Slightly distracted at times. Feel this is baseline ?  ?  ?General Comments    ? ?  ?Exercises   ?  ?Shoulder Instructions    ? ? ?  Home Living Family/patient expects to be discharged to:: Private residence ?Living Arrangements: Other relatives (Sister) ?Available Help at Discharge: Available PRN/intermittently ?Type of Home: House ?  ?  ?  ?Home Layout: Multi-level (Bedroom on second floor; work out equipment in basement) ?Alternate Level Stairs-Number of Steps: Flight ?Alternate Level Stairs-Rails: Right ?Bathroom Shower/Tub: Walk-in shower ?  ?Bathroom Toilet: Standard ?  ?  ?Home Equipment: None ?  ?  ?  ? ?  ?Prior Functioning/Environment Prior Level of Function : Independent/Modified Independent (Currently not working) ?  ?  ?  ?  ?  ?  ?  ?  ?  ? ?  ?  ?OT Problem List: Decreased activity tolerance ?  ?   ?OT Treatment/Interventions:    ?  ?OT Goals(Current goals can be found in the care plan section) Acute Rehab OT Goals ?Patient Stated Goal: Go home ?OT Goal Formulation: All assessment and education complete,  DC therapy  ?OT Frequency:   ?  ? ?Co-evaluation   ?  ?  ?  ?  ? ?  ?AM-PAC OT "6 Clicks" Daily Activity     ?Outcome Measure Help from another person eating meals?: None ?Help from another person taking care of personal grooming?: None ?Help from another person toileting, which includes using toliet, bedpan, or urinal?: None ?Help from another person bathing (including washing, rinsing, drying)?: None ?Help from another person to put on and taking off regular upper body clothing?: None ?Help from another person to put on and taking off regular lower body clothing?: None ?6 Click Score: 24 ?  ?End of Session Equipment Utilized During Treatment: Gait belt ?Nurse Communication: Mobility status;Other (comment) (pt in bathroom) ? ?Activity Tolerance: Patient tolerated treatment well ?Patient left: Other (comment) (in bathroom) ? ?OT Visit Diagnosis: Muscle weakness (generalized) (M62.81)  ?              ?Time: 3716-9678 ?OT Time Calculation (min): 12 min ?Charges:  OT General Charges ?$OT Visit: 1 Visit ?OT Evaluation ?$OT Eval Low Complexity: 1 Low ? ?[name] MSOT, OTR/L ?Acute Rehab ?Pager: [phone] ?Office: [phone] ? ?[name] ?11/05/2021, 12:21 PM ?

## 2021-11-05 NOTE — Progress Notes (Signed)
Initial Nutrition Assessment ? ?DOCUMENTATION CODES:  ?Non-severe (moderate) malnutrition in context of chronic illness ? ?INTERVENTION:  ?-Ensure Max po BID, each supplement provides 150 kcal and 30 grams of protein ?-provided diabetes diet education and resource for meal planning  ?-MVI with minerals daily ? ?NUTRITION DIAGNOSIS:  ?Moderate Malnutrition related to chronic illness (poorly controlled DM) as evidenced by moderate fat depletion, moderate muscle depletion, severe muscle depletion. ? ?GOAL:  ?Patient will meet greater than or equal to 90% of their needs ? ?MONITOR:  ?PO intake, Supplement acceptance, Labs, Weight trends ? ?REASON FOR ASSESSMENT:  ?Consult ?Diet education ? ?ASSESSMENT:  ?Pt with PMH significant for HTN and DM admitted for DKA ? ?Pt reports good appetite but states that he lost ~60 lbs over the last few months due to poorly controlled diabetes; however, unable to verify weight loss with documented weight readings. RD was consulted to provide diabetes diet education and meal ideas. Pt has received DM education in the past and is well versed in the basics. Reviewed information with pt and provided resources for meal planning. Also discussed importance of adequate calorie/protein intake to replete muscle/fat depletions. Pt agreeable to ONS.  ? ?PO Intake: 50-100 x 4 recorded meals (75% avg meal intake) ? ?UOP: 103ml x24 hours ?I/O: +8418ml since admit ? ?Medications: ? insulin aspart  0-5 Units Subcutaneous QHS  ? insulin aspart  0-9 Units Subcutaneous TID WC  ? insulin aspart  10 Units Subcutaneous TID WC  ? insulin glargine-yfgn  20 Units Subcutaneous Daily  ? ?Labs: ?Recent Labs  ?Lab 11/03/21 ?0350 11/03/21 ?[id number] 11/04/21 ?0431 11/04/21 ?1403 11/05/21 ?[id number]  ?NA 151*   < > 152* 143 143  ?K 3.2*   < > 3.3* 3.8 3.2*  ?CL 116*   < > 119* 111 108  ?CO2 26   < > 24 24 29   ?BUN 46*   < > 22* 21* 13  ?CREATININE 2.20*   < > 1.31* 1.28* 1.04  ?CALCIUM 8.7*   < > 8.6* 8.4* 8.7*  ?MG 2.0  --   2.0  --  1.8  ?PHOS  --   --   --   --  1.5*  ?GLUCOSE 181*   < > 145* 265* 210*  ? < > = values in this interval not displayed.  ?CBGs: 144-364 x24 hours (Diabetes Coordinator following) ?BHA 0.97 ?Anion gap 6 ?Hgb a1c (11/02/21): 11.8 ?  ?NUTRITION - FOCUSED PHYSICAL EXAM: ?Flowsheet Row Most Recent Value  ?Orbital Region Mild depletion  ?Upper Arm Region Moderate depletion  ?Thoracic and Lumbar Region Moderate depletion  ?Buccal Region Moderate depletion  ?Temple Region Moderate depletion  ?Clavicle Bone Region Severe depletion  ?Clavicle and Acromion Bone Region Severe depletion  ?Scapular Bone Region Severe depletion  ?Dorsal Hand Moderate depletion  ?Patellar Region Severe depletion  ?Anterior Thigh Region Severe depletion  ?Posterior Calf Region Moderate depletion  ?Edema (RD Assessment) None  ?Hair Reviewed  ?Eyes Reviewed  ?Mouth Reviewed  ?Skin Reviewed  ?Nails Reviewed  ? ?Diet Order:   ?Diet Order   ? ?       ?  Diet Carb Modified Fluid consistency: Thin; Room service appropriate? Yes  Diet effective now       ?  ? ?  ?  ? ?  ? ?EDUCATION NEEDS:  ?Education needs have been addressed ? ?Skin:  Skin Assessment: Reviewed RN Assessment ? ?Last BM:  PTA ? ?Height:  ?Ht Readings from Last 1 Encounters:  ?11/02/21 6'  2.02" (1.88 m)  ? ?Weight:  ?Wt Readings from Last 1 Encounters:  ?11/03/21 77.3 kg  ? ?BMI:  Body mass index is 21.87 kg/m?. ? ?Estimated Nutritional Needs:  ?Kcal:  2300-2500 ?Protein:  115-130 grams ?Fluid:  >2.3L ? ? ? ?Theone Stanley., MS, RD, LDN (she/her/hers) ?RD pager number and weekend/on-call pager number located in [place]. ?

## 2021-11-05 NOTE — Plan of Care (Signed)
?  RD consulted for nutrition education regarding diabetes.  ? ?Lab Results  ?Component Value Date  ? HGBA1C 11.8 (H) 11/02/2021  ? ? ?RD provided "Plate Method for Diabetes" handout from the Academy of Nutrition and Dietetics. Discussed different food groups and their effects on blood sugar, emphasizing carbohydrate-containing foods. Provided list of carbohydrates and recommended serving sizes of common foods. ? ?Discussed importance of controlled and consistent carbohydrate intake throughout the day. Provided examples of ways to balance meals/snacks and encouraged intake of high-fiber, whole grain complex carbohydrates. Teach back method used. ? ?Expect good compliance. ? ?Body mass index is 21.87 kg/m?Marland Kitchen Pt meets criteria for normal based on current BMI. ? ?Current diet order is carb modified, patient is consuming approximately 50-100% of meals at this time. Labs and medications reviewed. Please refer to RD full assessment note for more information.  ? ? ?Rae Lips., MS, RD, LDN (she/her/hers) ?RD pager number and weekend/on-call pager number located in [place]. ? ? ?

## 2021-11-05 NOTE — Progress Notes (Signed)
Patient is currently hospitalized.

## 2021-11-05 NOTE — Progress Notes (Signed)
PT Cancellation Note ? ?Patient Details ?Name: Stanley Edwards ?MRN: [id number] ?DOB: [birth year] ? ? ?Cancelled Treatment:    Reason Eval/Treat Not Completed: PT screened, no needs identified, will sign off Per OT, no skilled PT needs. Will sign off. If needs change, please re-consult.  ? ?Reuel Derby, PT, DPT  ?Acute Rehabilitation Services  ?Pager: [phone] ?Office: [phone] ? ? ? ?[place] ?11/05/2021, 12:26 PM ?

## 2021-11-05 NOTE — Progress Notes (Signed)
Inpatient Diabetes Program Recommendations ? ?AACE/ADA: New Consensus Statement on Inpatient Glycemic Control (2015) ? ?Target Ranges:  Prepandial:   less than 140 mg/dL ?     Peak postprandial:   less than 180 mg/dL (1-2 hours) ?     Critically ill patients:  140 - 180 mg/dL  ? ?Lab Results  ?Component Value Date  ? GLUCAP 364 (H) 11/05/2021  ? HGBA1C 11.8 (H) 11/02/2021  ? ? ?Review of Glycemic Control ? ?Inpatient Diabetes Program Recommendations:   ?Dr. Joseph Art ordered application of Freestyle CGM for discharge for patient. Education done regarding application and changing CGM sensor (alternate every 14 days on back of arms), 1 hour warm-up, use of glucometer when alert displays, how to scan CGM for glucose reading and information for PCP. Patient has also been given educational packet regarding use CGM sensor including the 1-800 toll free number for any questions, problems or needs related to the [facility] sensors or reader.    Sensor applied by patient to (R) Arm at 1330 pm.  Explained that glucose readings will not be available until 1 hour after application. Reviewed use of CGM including how to scan, changing Sensor, Vitamin C warning, arrows with glucose readings, and Freestyle app. Gave patient Freestyle reader as well due to the app not being compatible with his phone.  Patient very appreciative.  ? ?Thank you, ?Billy Fischer Kea Callan, RN, MSN, CDE  ?Diabetes Coordinator ?Inpatient Glycemic Control Team ?Team Pager [phone] (8am-5pm) ?11/05/2021 1:41 PM ? ? ? ? ?

## 2021-11-05 NOTE — Progress Notes (Signed)
?PROGRESS NOTE ? ? ? ?Stanley Edwards  UJW:[id number] DOB: 02/25/1966 DOA: 11/02/2021 ?PCP: [name], [name], MD  ? ?Brief Narrative:  ?56 year old BM PMHx DM type II uncontrolled with hyperglycemia, frequent visits for DKA (see below), noncompliant with DM nutrition plan.  HTN, ? ? ?ught to the ED via EMS.  Sister heard a thump and found him brother unresponsive with a GCS of of apparently 4.  CBG was initially 495.  Able to verbalize in the ED with incomprehensible words.  No signs of trauma.  Last dose of insulin was yesterday. ?  ?Several recent admissions approximately every 6 weeks with diabetic ketoacidosis since December.  Apparently had reasonably good control prior to this.  Unclear what has changed in his social situation to account for this. ? ? ?Subjective: ?A/O x4, negative CP, negative SOB.  Patient admits he has not been eating appropriate meals for diabetic. ? ? ?Assessment & Plan: ? Covid vaccination; ? ?Active Problems: ?  Diabetic ketoacidosis without coma associated with type 2 diabetes mellitus (HCC) ?  DM2 (diabetes mellitus, type 2) (HCC) ?  Hypokalemia ?  Hyperlipidemia ?  AKI (acute kidney injury) (HCC) ?  Hypertension ?  Acute metabolic encephalopathy ?  Hypernatremia ? ?Hypovolemic shock -resolve.  ?-Due to dehydration for osmotic diuresis from diabetic ketoacidosis. ?-DC maintenance fluids.  Starting D5W to correct sodium. ?- Oral rehydration ?-Discontinue antibiotics if no source of infection has been found ?-4/11 resolved ?  ?DM2 (diabetes mellitus, type 2) uncontrolled with hyperglycemia/DKA without coma  ?-Recurrent admissions for DKA (approximately 1 once every 6 weeks)  ?-4/8 hemoglobin A1c= 11.8 ?-Patient with recurrent episodes of DKA since December 2022.  Had previously done well with a hemoglobin A1c of 6.2% as of August. ?-Needs endocrine referral on discharge; may be a good candidate for a pump & CGM ?-4/11 DM Nutritionist consult pending:Uncontrolled diabetic requests education  on appropriate preparation of meals for diabetic. ?-4/11 spoke with DM coordinator concerning placement of CBG sensor, she agreed that this may help patient better control his diabetes.  Will place today and teach patient on how to use properly. ?- Semglee 20 units daily ?-4/11 increase NovoLog 10 units qac ?- Sensitive SSI ? ?  ?AKI (acute kidney injury) ?-Strict I's/O +7.9 L ?Lab Results  ?Component Value Date  ? CREATININE 1.04 11/05/2021  ? CREATININE 1.28 (H) 11/04/2021  ? CREATININE 1.31 (H) 11/04/2021  ? CREATININE 1.82 (H) 11/03/2021  ? CREATININE 2.20 (H) 11/03/2021  ? - Renally dose meds and avoid nephrotoxic meds ?-4/11 resolved ?  ?Hypertension ?Continue holding PTA antihypertensives with low normal blood pressures ?  ?Hyperlipidemia ?-Start PTA Atorvastatin 20 mg daily ?  ?Hypernatremia,  ?-Sodium up to 152 ?-D5W at 50 cc/h ?-4/11 resolved ?  ?Hypokalemia ?-Potassium goal> 4 ?-4/11 K-Phos PO 250 mg once ? ?Hypophosphatemia ?- 4/11 phosphorus goal> 2.5 ?- 4/11 see hypokalemia ? ? ?Moderate malnutrition ?- Per nutrition ? ? ?Goals of care ?- 4/11 PT/OT consult: Evaluate for SNF vs H/H ? ? ?Mobility Assessment (last 72 hours)   ? ? Mobility Assessment   ?No documentation. ? ?  ?  ? ?  ? ? ?DVT prophylaxis: Subcu heparin ?Code Status: Full ?Family Communication:  ?Status is: Inpatient ? ? ? ?Dispo: The patient is from: Home ?             Anticipated d/c is to: Home ?             Anticipated d/c date is: 1 day ?  Patient currently is not medically stable to d/c. ? ? ? ? ? ?Consultants:  ?DM coordinator ? ?Procedures/Significant Events:  ? ? ? ?I have personally reviewed and interpreted all radiology studies and my findings are as above. ? ?VENTILATOR SETTINGS: ? ? ? ?Cultures ? ? ?Antimicrobials: ?Anti-infectives (From admission, onward)  ? ? Start     Ordered Stop  ? 11/03/21 0815  vancomycin (VANCOCIN) IVPB 1000 mg/200 mL premix  Status:  Discontinued       ? 11/03/21 0715 11/04/21 0828  ?  11/02/21 1700  cefTRIAXone (ROCEPHIN) 2 g in sodium chloride 0.9 % 100 mL IVPB  Status:  Discontinued       ? 11/02/21 1617 11/05/21 1108  ? 11/02/21 1615  ceFEPIme (MAXIPIME) 2 g in sodium chloride 0.9 % 100 mL IVPB  Status:  Discontinued       ? 11/02/21 1614 11/02/21 1617  ? 11/02/21 1615  vancomycin (VANCOREADY) IVPB 1500 mg/300 mL       ? 11/02/21 1614 11/02/21 2006  ? 11/02/21 1614  vancomycin variable dose per unstable renal function (pharmacist dosing)  Status:  Discontinued       ? 11/02/21 1614 11/03/21 1428  ? 11/02/21 1600  metroNIDAZOLE (FLAGYL) IVPB 500 mg  Status:  Discontinued       ? 11/02/21 1550 11/04/21 0828  ? ?  ?  ? ? ?Devices ?  ? ?LINES / TUBES:  ? ? ? ? ?Continuous Infusions: ? cefTRIAXone (ROCEPHIN)  IV Stopped (11/04/21 1647)  ? ? ? ?Objective: ?Vitals:  ? 11/05/21 0003 11/05/21 0400 11/05/21 0429 11/05/21 0655  ?BP: 108/65  104/69   ?Pulse: 81  79   ?Resp: 17  13   ?Temp: 98.4 ?F (36.9 ?C) 98.4 ?F (36.9 ?C)  98.5 ?F (36.9 ?C)  ?TempSrc: Oral Oral  Oral  ?SpO2: 98%  98%   ?Weight:      ?Height:      ? ? ?Intake/Output Summary (Last 24 hours) at 11/05/2021 0825 ?Last data filed at 11/04/2021 2300 ?Gross per 24 hour  ?Intake 1317.63 ml  ?Output 1150 ml  ?Net 167.63 ml  ? ?Filed Weights  ? 11/02/21 1415 11/02/21 2000 11/03/21 0400  ?Weight: 77.1 kg 76.3 kg 77.3 kg  ? ? ?Examination: ? ?General: A/O x4, No acute respiratory distress ?Eyes: negative scleral hemorrhage, negative anisocoria, negative icterus ?ENT: Negative Runny nose, negative gingival bleeding, ?Neck:  Negative scars, masses, torticollis, lymphadenopathy, JVD ?Lungs: Clear to auscultation bilaterally without wheezes or crackles ?Cardiovascular: Regular rate and rhythm without murmur gallop or rub normal S1 and S2 ?Abdomen: negative abdominal pain, nondistended, positive soft, bowel sounds, no rebound, no ascites, no appreciable mass ?Extremities: No significant cyanosis, clubbing, or edema bilateral lower extremities ?Skin:  Negative rashes, lesions, ulcers ?Psychiatric:  Negative depression, negative anxiety, negative fatigue, negative mania  ?Central nervous system:  Cranial nerves II through XII intact, tongue/uvula midline, all extremities muscle strength 5/5, sensation intact throughout, negative dysarthria, negative expressive aphasia, negative receptive aphasia. ? ?.  ? ? ? ?Data Reviewed: Care during the described time interval was provided by me .  I have reviewed this patient's available data, including medical history, events of note, physical examination, and all test results as part of my evaluation.  ? ?CBC: ?Recent Labs  ?Lab 11/02/21 ?1434 11/02/21 ?1444 11/02/21 ?1501 11/02/21 ?1615 11/04/21 ?0431 11/05/21 ?9629  ?WBC 16.3*  --   --  13.0* 10.3 8.4  ?NEUTROABS 15.0*  --   --   --   --  6.5  ?HGB 13.8 15.3  16.0 16.0 11.1* 9.5* 9.0*  ?HCT 45.6 45.0  47.0 47.0 35.6* 28.3* 27.6*  ?MCV 120.0*  --   --  116.0* 106.4* 107.8*  ?PLT 294  --   --  213 164 143*  ? ?Basic Metabolic Panel: ?Recent Labs  ?Lab 11/02/21 ?2359 11/03/21 ?0350 11/03/21 ?3818 11/04/21 ?2993 11/04/21 ?1403 11/05/21 ?7169  ?NA 149* 151* 149* 152* 143 143  ?K 2.9* 3.2* 3.9 3.3* 3.8 3.2*  ?CL 114* 116* 117* 119* 111 108  ?CO2 20* 26 24 24 24 29   ?GLUCOSE 277* 181* 223* 145* 265* 210*  ?BUN 53* 46* 40* 22* 21* 13  ?CREATININE 2.98* 2.20* 1.82* 1.31* 1.28* 1.04  ?CALCIUM 8.7* 8.7* 8.5* 8.6* 8.4* 8.7*  ?MG 2.1 2.0  --  2.0  --  1.8  ?PHOS  --   --   --   --   --  1.5*  ? ?GFR: ?Estimated Creatinine Clearance: 86.7 mL/min (by C-G formula based on SCr of 1.04 mg/dL). ?Liver Function Tests: ?Recent Labs  ?Lab 11/02/21 ?1434 11/05/21 ?0657  ?AST 13* 35  ?ALT 21 33  ?ALKPHOS 105 63  ?BILITOT 2.5* 1.6*  ?PROT 8.4* 6.0*  ?ALBUMIN 4.3 3.0*  ? ?No results for input(s): LIPASE, AMYLASE in the last 168 hours. ?No results for input(s): AMMONIA in the last 168 hours. ?Coagulation Profile: ?No results for input(s): INR, PROTIME in the last 168 hours. ?Cardiac Enzymes: ?No  results for input(s): CKTOTAL, CKMB, CKMBINDEX, TROPONINI in the last 168 hours. ?BNP (last 3 results) ?No results for input(s): PROBNP in the last 8760 hours. ?HbA1C: ?Recent Labs  ?  11/02/21 ?1615  ?HGBA1C 11.8

## 2021-11-05 NOTE — Progress Notes (Signed)
Inpatient Diabetes Program Recommendations ? ?AACE/ADA: New Consensus Statement on Inpatient Glycemic Control (2015) ? ?Target Ranges:  Prepandial:   less than 140 mg/dL ?     Peak postprandial:   less than 180 mg/dL (1-2 hours) ?     Critically ill patients:  140 - 180 mg/dL  ? ?Lab Results  ?Component Value Date  ? GLUCAP 195 (H) 11/05/2021  ? HGBA1C 11.8 (H) 11/02/2021  ? ? ?Review of Glycemic Control ? Latest Reference Range & Units 11/04/21 06:46 11/04/21 11:08 11/04/21 15:57 11/04/21 21:31 11/05/21 07:00  ?Glucose-Capillary 70 - 99 mg/dL 595 (H) 638 (H) 756 (H) 266 (H) 195 (H)  ?(H): Data is abnormally high ? ?Diabetes history: DM2 ?Outpatient Diabetes medications: 70/30 20 units bid ac meals ?Current orders for Inpatient glycemic control: Semglee 20 units qd, Novolog 3 units tid meal coverage, Novolog 0-9 units tid correction , 0-5 units hs ? ?Inpatient Diabetes Program Recommendations:   ?Noted post prandial CBGs elevated. ?Please consider: ?-Increase Novolog meal coverage to 5 units tid if eats 50% ? ?Will place a libre sensor prior to discharge for patient per Dr. Joseph Art order prior to D/C home if patient agreeable. ?Futures trader order # for home: ?#[id number] ? ?Thank you, ?Billy Fischer Ozie Dimaria, RN, MSN, CDE  ?Diabetes Coordinator ?Inpatient Glycemic Control Team ?Team Pager [phone] (8am-5pm) ?11/05/2021 9:16 AM ? ? ? ? ?

## 2021-11-05 NOTE — Plan of Care (Signed)

## 2021-11-06 DIAGNOSIS — G9341 Metabolic encephalopathy: Secondary | ICD-10-CM | POA: Diagnosis not present

## 2021-11-06 DIAGNOSIS — N179 Acute kidney failure, unspecified: Secondary | ICD-10-CM | POA: Diagnosis not present

## 2021-11-06 DIAGNOSIS — E1110 Type 2 diabetes mellitus with ketoacidosis without coma: Secondary | ICD-10-CM | POA: Diagnosis not present

## 2021-11-06 DIAGNOSIS — E440 Moderate protein-calorie malnutrition: Secondary | ICD-10-CM | POA: Diagnosis present

## 2021-11-06 DIAGNOSIS — E0810 Diabetes mellitus due to underlying condition with ketoacidosis without coma: Secondary | ICD-10-CM | POA: Diagnosis not present

## 2021-11-06 LAB — COMPREHENSIVE METABOLIC PANEL
ALT: 30 U/L (ref 0–44)
AST: 33 U/L (ref 15–41)
Albumin: 2.5 g/dL — ABNORMAL LOW (ref 3.5–5.0)
Alkaline Phosphatase: 52 U/L (ref 38–126)
Anion gap: 6 (ref 5–15)
BUN: 17 mg/dL (ref 6–20)
CO2: 27 mmol/L (ref 22–32)
Calcium: 8.3 mg/dL — ABNORMAL LOW (ref 8.9–10.3)
Chloride: 108 mmol/L (ref 98–111)
Creatinine, Ser: 0.97 mg/dL (ref 0.61–1.24)
GFR, Estimated: >60 mL/min (ref >60)
Glucose, Bld: 222 mg/dL — ABNORMAL HIGH (ref 70–99)
Potassium: 3.4 mmol/L — ABNORMAL LOW (ref 3.5–5.1)
Sodium: 141 mmol/L (ref 135–145)
Total Bilirubin: 0.9 mg/dL (ref 0.3–1.2)
Total Protein: 4.8 g/dL — ABNORMAL LOW (ref 6.5–8.1)

## 2021-11-06 LAB — PHOSPHORUS: Phosphorus: 2.2 mg/dL — ABNORMAL LOW (ref 2.5–4.6)

## 2021-11-06 LAB — GLUCOSE, CAPILLARY
Glucose-Capillary: 225 mg/dL — ABNORMAL HIGH (ref 70–99)
Glucose-Capillary: 176 mg/dL — ABNORMAL HIGH (ref 70–99)
Glucose-Capillary: 157 mg/dL — ABNORMAL HIGH (ref 70–99)
Glucose-Capillary: 207 mg/dL — ABNORMAL HIGH (ref 70–99)

## 2021-11-06 LAB — MAGNESIUM: Magnesium: 1.9 mg/dL (ref 1.7–2.4)

## 2021-11-06 MED ORDER — INSULIN GLARGINE-YFGN 100 UNIT/ML ~~LOC~~ SOLN
25.0000 [IU] | Freq: Every day | SUBCUTANEOUS | Status: DC
Start: 2021-11-07 — End: 2021-11-07
  Administered 2021-11-07: 25 [IU] via SUBCUTANEOUS
  Filled 2021-11-06: qty 0.25

## 2021-11-06 MED ORDER — K PHOS MONO-SOD PHOS DI & MONO 155-852-130 MG PO TABS
500.0000 mg | ORAL_TABLET | Freq: Once | ORAL | Status: AC
  Administered 2021-11-06: 500 mg via ORAL
  Filled 2021-11-06: qty 2

## 2021-11-06 NOTE — Progress Notes (Signed)
PT Cancellation Note ? ?Patient Details ?Name: ADAIN GEURIN ?MRN: [id number] ?DOB: 11-11-1965 ? ? ?Cancelled Treatment:    Reason Eval/Treat Not Completed: PT screened, no needs identified, will sign off No change in pt status noted from yesterday. No current skilled PT needs at this time. If needs change, please re-consult.  ? ?Farley Ly, PT, DPT  ?Acute Rehabilitation Services  ?Pager: [phone] ?Office: [phone] ? ? ? ?[place] Ashkan Chamberland ?11/06/2021, 8:49 AM ?

## 2021-11-06 NOTE — Progress Notes (Signed)
?PROGRESS NOTE ? ? ? ?Stanley Edwards  LKG:[id number] DOB: 1966/05/07 DOA: 11/02/2021 ?PCP: Stanley Edwards  ? ?Brief Narrative:  ?56 year old BM PMHx DM type II uncontrolled with hyperglycemia, frequent visits for DKA (see below), noncompliant with DM nutrition plan.  HTN, ? ? ?ught to the ED via EMS.  Sister heard a thump and found him brother unresponsive with a GCS of of apparently 4.  CBG was initially 495.  Able to verbalize in the ED with incomprehensible words.  No signs of trauma.  Last dose of insulin was yesterday. ?  ?Several recent admissions approximately every 6 weeks with diabetic ketoacidosis since December.  Apparently had reasonably good control prior to this.  Unclear what has changed in his social situation to account for this. ? ? ?Subjective: ?4/12 A/O x4.  Negative CP, negative SOB.  Sitting in chair comfortably. ? ? ? ?Assessment & Plan: ? Covid vaccination; ? ?Active Problems: ?  Diabetic ketoacidosis without coma associated with type 2 diabetes mellitus (HCC) ?  DM2 (diabetes mellitus, type 2) (HCC) ?  Hypokalemia ?  Hyperlipidemia ?  AKI (acute kidney injury) (HCC) ?  Hypertension ?  Acute metabolic encephalopathy ?  Hypernatremia ?  Malnutrition of moderate degree ?  Protein-calorie malnutrition, moderate (HCC) ? ?Hypovolemic shock -resolve.  ?-Due to dehydration for osmotic diuresis from diabetic ketoacidosis. ?-DC maintenance fluids.  Starting D5W to correct sodium. ?- Oral rehydration ?-Discontinue antibiotics if no source of infection has been found ?-4/11 resolved ?  ?DM2 (diabetes mellitus, type 2) uncontrolled with hyperglycemia/DKA without coma  ?-Recurrent admissions for DKA (approximately 1 once every 6 weeks)  ?-4/8 hemoglobin A1c= 11.8 ?-Patient with recurrent episodes of DKA since December 2022.  Had previously done well with a hemoglobin A1c of 6.2% as of August. ?-Needs endocrine referral on discharge; may be a good candidate for a pump & CGM ?-4/11 DM Nutritionist  consult pending:Uncontrolled diabetic requests education on appropriate preparation of meals for diabetic. ?-4/11 spoke with DM coordinator concerning placement of CBG sensor, she agreed that this may help patient better control his diabetes.  Will place today and teach patient on how to use properly. ?-4/11 increase NovoLog 10 units qac ?-4/12 increase Semglee 25 units daily ?- Sensitive SSI ? ?  ?AKI (acute kidney injury) ?-Strict I's/O +9.9 L ?Lab Results  ?Component Value Date  ? CREATININE 0.97 11/06/2021  ? CREATININE 1.04 11/05/2021  ? CREATININE 1.28 (H) 11/04/2021  ? CREATININE 1.31 (H) 11/04/2021  ? CREATININE 1.82 (H) 11/03/2021  ? - Renally dose meds and avoid nephrotoxic meds ?-4/11 resolved ?  ?Hypertension ?-Continue holding PTA antihypertensives with low normal blood pressures ?  ?Hyperlipidemia ?-Atorvastatin 20 mg daily ?  ?Hypernatremia,  ?-Sodium up to 152 ?-D5W at 50 cc/h ?-4/11 resolved ?  ?Hypokalemia ?-Potassium goal> 4 ?-4/11 K-Phos PO 250 mg once ?-4/12 K-Phos PO 500 mg x1 ? ?Hypophosphatemia ?- 4/11 phosphorus goal> 2.5 ?- 4/11 see hypokalemia ? ? ?Moderate malnutrition ?- Per nutrition ? ? ?Goals of care ?- 4/11 PT/OT consult: Evaluate for SNF vs H/H (pending) ? ? ?Mobility Assessment (last 72 hours)   ? ? Mobility Assessment   ? ? Row Name 11/05/21 2045 11/05/21 1200  ?  ?  ?  ? Does patient have an order for bedrest or is patient medically unstable No - Continue assessment --     ? What is the highest level of mobility based on the progressive mobility assessment? Level 6 (Walks independently in room and  hall) - Balance while walking in room without assist - Complete Level 6 (Walks independently in room and hall) - Balance while walking in room without assist - Complete     ? ?  ?  ? ?  ? ? ?DVT prophylaxis: Subcu heparin ?Code Status: Full ?Family Communication:  ?Status is: Inpatient ? ? ? ?Dispo: The patient is from: Home ?             Anticipated d/c is to: Home ?              Anticipated d/c date is: 1 day ?             Patient currently is not medically stable to d/c. ? ? ? ? ? ?Consultants:  ?DM coordinator ? ?Procedures/Significant Events:  ? ? ? ?I have personally reviewed and interpreted all radiology studies and my findings are as above. ? ?VENTILATOR SETTINGS: ? ? ? ?Cultures ? ? ?Antimicrobials: ?Anti-infectives (From admission, onward)  ? ? Start     Ordered Stop  ? 11/03/21 0815  vancomycin (VANCOCIN) IVPB 1000 mg/200 mL premix  Status:  Discontinued       ? 11/03/21 0715 11/04/21 0828  ? 11/02/21 1700  cefTRIAXone (ROCEPHIN) 2 g in sodium chloride 0.9 % 100 mL IVPB  Status:  Discontinued       ? 11/02/21 1617 11/05/21 1108  ? 11/02/21 1615  ceFEPIme (MAXIPIME) 2 g in sodium chloride 0.9 % 100 mL IVPB  Status:  Discontinued       ? 11/02/21 1614 11/02/21 1617  ? 11/02/21 1615  vancomycin (VANCOREADY) IVPB 1500 mg/300 mL       ? 11/02/21 1614 11/02/21 2006  ? 11/02/21 1614  vancomycin variable dose per unstable renal function (pharmacist dosing)  Status:  Discontinued       ? 11/02/21 1614 11/03/21 1428  ? 11/02/21 1600  metroNIDAZOLE (FLAGYL) IVPB 500 mg  Status:  Discontinued       ? 11/02/21 1550 11/04/21 0828  ? ?  ?  ? ? ?Devices ?  ? ?LINES / TUBES:  ? ? ? ? ?Continuous Infusions: ? ? ? ? ?Objective: ?Vitals:  ? 11/05/21 1407 11/05/21 1647 11/05/21 2034 11/06/21 0544  ?BP: 98/65 (!) 89/59 98/66 104/67  ?Pulse: 95 85 81 87  ?Resp: 16 16 18 18   ?Temp: 98.2 ?F (36.8 ?C) 97.8 ?F (36.6 ?C) 98.7 ?F (37.1 ?C) 99.4 ?F (37.4 ?C)  ?TempSrc: Oral Oral    ?SpO2: 100% 100% 100% 100%  ?Weight:   77.6 kg   ?Height:      ? ? ?Intake/Output Summary (Last 24 hours) at 11/06/2021 0824 ?Last data filed at 11/06/2021 0600 ?Gross per 24 hour  ?Intake 1555 ml  ?Output 301 ml  ?Net 1254 ml  ? ? ?Filed Weights  ? 11/02/21 2000 11/03/21 0400 11/05/21 2034  ?Weight: 76.3 kg 77.3 kg 77.6 kg  ? ? ?Examination: ? ?General: A/O x4, No acute respiratory distress ?Eyes: negative scleral hemorrhage, negative  anisocoria, negative icterus ?ENT: Negative Runny nose, negative gingival bleeding, ?Neck:  Negative scars, masses, torticollis, lymphadenopathy, JVD ?Lungs: Clear to auscultation bilaterally without wheezes or crackles ?Cardiovascular: Regular rate and rhythm without murmur gallop or rub normal S1 and S2 ?Abdomen: negative abdominal pain, nondistended, positive soft, bowel sounds, no rebound, no ascites, no appreciable mass ?Extremities: No significant cyanosis, clubbing, or edema bilateral lower extremities ?Skin: Negative rashes, lesions, ulcers ?Psychiatric:  Negative depression, negative anxiety, negative fatigue, negative mania  ?  Central nervous system:  Cranial nerves II through XII intact, tongue/uvula midline, all extremities muscle strength 5/5, sensation intact throughout, negative dysarthria, negative expressive aphasia, negative receptive aphasia. ? ?.  ? ? ? ?Data Reviewed: Care during the described time interval was provided by me .  I have reviewed this patient's available data, including medical history, events of note, physical examination, and all test results as part of my evaluation.  ? ?CBC: ?Recent Labs  ?Lab 11/02/21 ?1434 11/02/21 ?1444 11/02/21 ?1501 11/02/21 ?1615 11/04/21 ?0431 11/05/21 ?0349  ?WBC 16.3*  --   --  13.0* 10.3 8.4  ?NEUTROABS 15.0*  --   --   --   --  6.5  ?HGB 13.8 15.3  16.0 16.0 11.1* 9.5* 9.0*  ?HCT 45.6 45.0  47.0 47.0 35.6* 28.3* 27.6*  ?MCV 120.0*  --   --  116.0* 106.4* 107.8*  ?PLT 294  --   --  213 164 143*  ? ? ?Basic Metabolic Panel: ?Recent Labs  ?Lab 11/02/21 ?2359 11/03/21 ?1791 11/03/21 ?5056 11/04/21 ?9794 11/04/21 ?1403 11/05/21 ?8016 11/06/21 ?0255  ?NA 149* 151* 149* 152* 143 143 141  ?K 2.9* 3.2* 3.9 3.3* 3.8 3.2* 3.4*  ?CL 114* 116* 117* 119* 111 108 108  ?CO2 20* 26 24 24 24 29 27   ?GLUCOSE 277* 181* 223* 145* 265* 210* 222*  ?BUN 53* 46* 40* 22* 21* 13 17  ?CREATININE 2.98* 2.20* 1.82* 1.31* 1.28* 1.04 0.97  ?CALCIUM 8.7* 8.7* 8.5* 8.6* 8.4* 8.7*  8.3*  ?MG 2.1 2.0  --  2.0  --  1.8 1.9  ?PHOS  --   --   --   --   --  1.5* 2.2*  ? ? ?GFR: ?Estimated Creatinine Clearance: 93.3 mL/min (by C-G formula based on SCr of 0.97 mg/dL). ?Liver Function Tests: ?Recent

## 2021-11-07 ENCOUNTER — Other Ambulatory Visit (HOSPITAL_COMMUNITY): Payer: Self-pay

## 2021-11-07 LAB — COMPREHENSIVE METABOLIC PANEL
ALT: 30 U/L (ref 0–44)
AST: 30 U/L (ref 15–41)
Albumin: 2.6 g/dL — ABNORMAL LOW (ref 3.5–5.0)
Alkaline Phosphatase: 52 U/L (ref 38–126)
Anion gap: 3 — ABNORMAL LOW (ref 5–15)
BUN: 16 mg/dL (ref 6–20)
CO2: 31 mmol/L (ref 22–32)
Calcium: 8.4 mg/dL — ABNORMAL LOW (ref 8.9–10.3)
Chloride: 103 mmol/L (ref 98–111)
Creatinine, Ser: 0.94 mg/dL (ref 0.61–1.24)
GFR, Estimated: >60 mL/min (ref >60)
Glucose, Bld: 171 mg/dL — ABNORMAL HIGH (ref 70–99)
Potassium: 3.3 mmol/L — ABNORMAL LOW (ref 3.5–5.1)
Sodium: 137 mmol/L (ref 135–145)
Total Bilirubin: 0.6 mg/dL (ref 0.3–1.2)
Total Protein: 5.1 g/dL — ABNORMAL LOW (ref 6.5–8.1)

## 2021-11-07 LAB — MAGNESIUM: Magnesium: 1.9 mg/dL (ref 1.7–2.4)

## 2021-11-07 LAB — GLUCOSE, CAPILLARY
Glucose-Capillary: 319 mg/dL — ABNORMAL HIGH (ref 70–99)
Glucose-Capillary: 156 mg/dL — ABNORMAL HIGH (ref 70–99)

## 2021-11-07 LAB — PHOSPHORUS: Phosphorus: 3 mg/dL (ref 2.5–4.6)

## 2021-11-07 MED ORDER — INSULIN LISPRO (1 UNIT DIAL) 100 UNIT/ML (KWIKPEN)
15.0000 [IU] | PEN_INJECTOR | Freq: Three times a day (TID) | SUBCUTANEOUS | 0 refills | Status: DC
  Filled 2021-11-07: qty 15, 30d supply, fill #0

## 2021-11-07 MED ORDER — POTASSIUM CHLORIDE CRYS ER 20 MEQ PO TBCR
50.0000 meq | EXTENDED_RELEASE_TABLET | Freq: Once | ORAL | Status: AC
  Administered 2021-11-07: 50 meq via ORAL
  Filled 2021-11-07: qty 1

## 2021-11-07 MED ORDER — INSULIN GLARGINE-YFGN 100 UNIT/ML ~~LOC~~ SOLN: 30.0000 [IU] | Freq: Every day | SUBCUTANEOUS | Status: DC

## 2021-11-07 MED ORDER — INSULIN PEN NEEDLE 32G X 4 MM MISC
0 refills | Status: DC
  Filled 2021-11-07: qty 100, 25d supply, fill #0

## 2021-11-07 MED ORDER — INSULIN GLARGINE 100 UNIT/ML SOLOSTAR PEN
30.0000 [IU] | PEN_INJECTOR | Freq: Every day | SUBCUTANEOUS | 0 refills | Status: DC
  Filled 2021-11-07: qty 9, 30d supply, fill #0

## 2021-11-07 MED ORDER — INSULIN ASPART 100 UNIT/ML IJ SOLN
15.0000 [IU] | Freq: Three times a day (TID) | INTRAMUSCULAR | Status: DC
  Administered 2021-11-07: 15 [IU] via SUBCUTANEOUS

## 2021-11-07 NOTE — Discharge Summary (Signed)
Physician Discharge Summary  ?Stanley Edwards OZH:[id number] DOB: [birth year] DOA: 11/02/2021 ? ?PCP: Hoy Register, MD ? ?Admit date: 11/02/2021 ?Discharge date: 11/07/2021 ? ?Time spent: 35 minutes ? ?Recommendations for Outpatient Follow-up:  ? ?Hypovolemic shock -resolve.  ?-Due to dehydration for osmotic diuresis from diabetic ketoacidosis. ?-DC maintenance fluids.  Starting D5W to correct sodium. ?- Oral rehydration ?-Discontinue antibiotics if no source of infection has been found ?-4/11 resolved ?  ?DM2 (diabetes mellitus, type 2) uncontrolled with hyperglycemia/DKA without coma  ?-Recurrent admissions for DKA (approximately 1 once every 6 weeks)  ?-4/8 hemoglobin A1c= 11.8 ?-Patient with recurrent episodes of DKA since December 2022.  Had previously done well with a hemoglobin A1c of 6.2% as of August. ?-Needs endocrine referral on discharge; may be a good candidate for a pump & CGM ?-4/11 DM Nutritionist consult pending:Uncontrolled diabetic requests education on appropriate preparation of meals for diabetic. ?-4/11 spoke with DM coordinator concerning placement of CBG sensor, she agreed that this may help patient better control his diabetes.  Will place today and teach patient on how to use properly. ?- Humalog 15 units qac ?- Lantus 30 units daily ?-Follow-up in 1 week with PCP DM type II uncontrolled with hyperglycemia, make further adjustments to insulin regimen. ? ?  ?AKI (acute kidney injury) ?-Strict I's/O +9.9 L ?Lab Results  ?Component Value Date  ? CREATININE 0.94 11/07/2021  ? CREATININE 0.97 11/06/2021  ? CREATININE 1.04 11/05/2021  ? CREATININE 1.28 (H) 11/04/2021  ? CREATININE 1.31 (H) 11/04/2021  ? ?Hypertension ?-Continue holding PTA antihypertensives with low normal blood pressures ?  ?Hyperlipidemia ?-Atorvastatin 20 mg daily ?  ?Hypernatremia,  ?-Sodium up to 152 ?-D5W at 50 cc/h ?-4/11 resolved ?  ?Hypokalemia ?-Potassium goal> 4 ?- K-Dur  50 mEq prior to discharge ? ?Hypophosphatemia ?- 4/11  phosphorus goal> 2.5 ?- 4/11 see hypokalemia ?  ?  ?Moderate malnutrition ?- Per nutrition ? ?Discharge Diagnoses:  ?Active Problems: ?  Diabetic ketoacidosis without coma associated with type 2 diabetes mellitus (HCC) ?  DM2 (diabetes mellitus, type 2) (HCC) ?  Hypokalemia ?  Hyperlipidemia ?  AKI (acute kidney injury) (HCC) ?  Hypertension ?  Acute metabolic encephalopathy ?  Hypernatremia ?  Malnutrition of moderate degree ?  Protein-calorie malnutrition, moderate (HCC) ? ? ?Discharge Condition: Stable ? ?Diet recommendation: Carb modified ? ?Filed Weights  ? 11/02/21 2000 11/03/21 0400 11/05/21 2034  ?Weight: 76.3 kg 77.3 kg 77.6 kg  ? ? ?History of present illness:  ?56 year old BM PMHx DM type II uncontrolled with hyperglycemia, frequent visits for DKA (see below), noncompliant with DM nutrition plan.  HTN, ?  ?  ?ught to the ED via EMS.  Sister heard a thump and found him brother unresponsive with a GCS of of apparently 4.  CBG was initially 495.  Able to verbalize in the ED with incomprehensible words.  No signs of trauma.  Last dose of insulin was yesterday. ?  ?Several recent admissions approximately every 6 weeks with diabetic ketoacidosis since December.  Apparently had reasonably good control prior to this.  Unclear what has changed in his social situation to account for this. ? ?Hospital Course:  ?See above ? ? ?Consultations: ?DM: Coordinator ? ? ? ?Antibiotics ?Anti-infectives (From admission, onward)  ? ? Start     Ordered Stop  ? 11/03/21 0815  vancomycin (VANCOCIN) IVPB 1000 mg/200 mL premix  Status:  Discontinued       ? 11/03/21 0715 11/04/21 0828  ? 11/02/21 1700  cefTRIAXone (ROCEPHIN) 2 g in sodium chloride 0.9 % 100 mL IVPB  Status:  Discontinued       ? 11/02/21 1617 11/05/21 1108  ? 11/02/21 1615  ceFEPIme (MAXIPIME) 2 g in sodium chloride 0.9 % 100 mL IVPB  Status:  Discontinued       ? 11/02/21 1614 11/02/21 1617  ? 11/02/21 1615  vancomycin (VANCOREADY) IVPB 1500 mg/300 mL       ?  11/02/21 1614 11/02/21 2006  ? 11/02/21 1614  vancomycin variable dose per unstable renal function (pharmacist dosing)  Status:  Discontinued       ? 11/02/21 1614 11/03/21 1428  ? 11/02/21 1600  metroNIDAZOLE (FLAGYL) IVPB 500 mg  Status:  Discontinued       ? 11/02/21 1550 11/04/21 0828  ? ?  ?  ? ? ?Discharge Exam: ?Vitals:  ? 11/06/21 1716 11/06/21 2048 11/07/21 0524 11/07/21 0951  ?BP: 115/63 94/60 96/70  97/62  ?Pulse: 95 90 87 98  ?Resp: 17 14 18 16   ?Temp: 98.1 ?F (36.7 ?C) 98.3 ?F (36.8 ?C) 98.7 ?F (37.1 ?C) 97.7 ?F (36.5 ?C)  ?TempSrc: Oral Oral Oral Oral  ?SpO2: 100% 100% 99% 96%  ?Weight:      ?Height:      ? ? ?General: A/O x4, No acute respiratory distress ?Eyes: negative scleral hemorrhage, negative anisocoria, negative icterus ?ENT: Negative Runny nose, negative gingival bleeding, ?Neck:  Negative scars, masses, torticollis, lymphadenopathy, JVD ?Lungs: Clear to auscultation bilaterally without wheezes or crackles ?Cardiovascular: Regular rate and rhythm without murmur gallop or rub normal S1 and S2 ? ?Discharge Instructions ? ? ?Allergies as of 11/07/2021   ?No Known Allergies ?  ? ?  ?Medication List  ?  ? ?STOP taking these medications   ? ?NovoLOG Mix 70/30 (70-30) 100 UNIT/ML injection ?Generic drug: insulin aspart protamine- aspart ?  ? ?  ? ?TAKE these medications   ? ?atorvastatin 20 MG tablet ?Commonly known as: LIPITOR ?Take 1 tablet (20 mg total) by mouth daily. ?  ?Basaglar KwikPen 100 UNIT/ML ?Inject 30 Units into the skin daily. ?  ?HumaLOG KwikPen 100 UNIT/ML KwikPen ?Generic drug: insulin lispro ?Inject 15 Units into the skin 3 (three) times daily with meals. ?  ?Pentips 32G X 4 MM Misc ?Generic drug: Insulin Pen Needle ?Use to inject insulin 4 times daily. ?What changed:  ?how much to take ?how to take this ?when to take this ?  ?True Metrix Blood Glucose Test test strip ?Generic drug: glucose blood ?Use 3 times daily before meals ?  ?True Metrix Meter Devi ?1 each by Does not apply  route 3 (three) times daily before meals. ?  ? ?  ? ?No Known Allergies ? ? ? ?The results of significant diagnostics from this hospitalization (including imaging, microbiology, ancillary and laboratory) are listed below for reference.   ? ?Significant Diagnostic Studies: ?CT HEAD WO CONTRAST (11/09/2021) ? ?Result Date: 11/02/2021 ?CLINICAL DATA:  Found unresponsive EXAM: CT HEAD WITHOUT CONTRAST CT CERVICAL SPINE WITHOUT CONTRAST TECHNIQUE: Multidetector CT imaging of the head and cervical spine was performed following the standard protocol without intravenous contrast. Multiplanar CT image reconstructions of the cervical spine were also generated. RADIATION DOSE REDUCTION: This exam was performed according to the departmental dose-optimization program which includes automated exposure control, adjustment of the mA and/or kV according to patient size and/or use of iterative reconstruction technique. COMPARISON:  None. FINDINGS: CT HEAD FINDINGS Brain: There is no mass, hemorrhage or extra-axial collection. The size  and configuration of the ventricles and extra-axial CSF spaces are normal. The brain parenchyma is normal, without evidence of acute or chronic infarction. Vascular: No abnormal hyperdensity of the major intracranial arteries or dural venous sinuses. No intracranial atherosclerosis. Skull: The visualized skull base, calvarium and extracranial soft tissues are normal. Sinuses/Orbits: No fluid levels or advanced mucosal thickening of the visualized paranasal sinuses. No mastoid or middle ear effusion. The orbits are normal. CT CERVICAL SPINE FINDINGS Alignment: No static subluxation. Facets are aligned. Occipital condyles are normally positioned. Skull base and vertebrae: No acute fracture. Soft tissues and spinal canal: No prevertebral fluid or swelling. No visible canal hematoma. Disc levels: No advanced spinal canal or neural foraminal stenosis. Upper chest: No pneumothorax, pulmonary nodule or pleural  effusion. Other: There is gas in the right neck soft tissues tracking superiorly in the right carotid space. IMPRESSION: 1. No acute intracranial abnormality. 2. No acute fracture or static subluxation of the cervi

## 2021-11-07 NOTE — Progress Notes (Signed)
Discharge instructions given to patient.  Patient verbalized understanding.  Encouraged to call the doctor for questions.  Discharged home. 

## 2021-11-08 ENCOUNTER — Telehealth: Payer: Self-pay

## 2021-11-08 NOTE — Telephone Encounter (Signed)
Transition Care Management Unsuccessful Follow-up Telephone Call ? ?Date of discharge and from where: [facility] on 11/07/2021 ? ?Attempts:  1st Attempt ? ?Reason for unsuccessful TCM follow-up call:  Unable to leave message , VM full. send SMS notification to call back .  ? ?  ?

## 2021-11-11 ENCOUNTER — Telehealth: Payer: Self-pay

## 2021-11-11 NOTE — Telephone Encounter (Signed)
Transition Care Management Unsuccessful Follow-up Telephone Call ?  ?Date of discharge and from where: [facility] on 11/07/2021 ?  ?Attempts: 2nd Attempt ?  ?Reason for unsuccessful TCM follow-up call: Left VM . Call back requested.   ?

## 2021-11-12 ENCOUNTER — Telehealth: Payer: Self-pay

## 2021-11-12 NOTE — Telephone Encounter (Signed)
Transition Care Management Follow-up Telephone Call ?Date of discharge and from where: 11/07/2021, [facility]  ?How have you been since you were released from the hospital? He stated that he is doing pretty good.  ?Any questions or concerns? No ? ?Items Reviewed: ?Did the pt receive and understand the discharge instructions provided? Yes  ?Medications obtained and verified? Yes - he said he has all of his medications and did not have any questions about his med regime. ?Other? No  ?Any new allergies since your discharge? No  ?Dietary orders reviewed? Yes ?Do you have support at home?  Not addressed.  ? ?Home Care and Equipment/Supplies: ?Were home health services ordered? no ?If so, what is the name of the agency? N/a  ?Has the agency set up a time to come to the patient's home? not applicable ?Were any new equipment or medical supplies ordered?  Yes: [employer] ?What is the name of the medical supply agency? He received it in the hospital  ?Were you able to get the supplies/equipment? yes ?Do you have any questions related to the use of the equipment or supplies? No ? ?He said that his blood sugars have been running less than 200 since he has been home  ? ?Functional Questionnaire: (I = Independent and D = Dependent) ?ADLs: independent ? ?Follow up appointments reviewed: ? ?PCP Hospital f/u appt confirmed? Yes  Scheduled to see Dr Alvis Lemmings - 5/2/202.  ?Specialist Hospital f/u appt confirmed?  None scheduled at this time.    ?Are transportation arrangements needed? No  ?If their condition worsens, is the pt aware to call PCP or go to the Emergency Dept.? Yes ?Was the patient provided with contact information for the PCP's office or ED? Yes ?Was to pt encouraged to call back with questions or concerns? Yes ? ?

## 2021-11-26 ENCOUNTER — Encounter: Payer: Self-pay | Admitting: Family Medicine

## 2021-11-26 ENCOUNTER — Ambulatory Visit: Payer: Commercial Managed Care - HMO | Attending: Family Medicine | Admitting: Family Medicine

## 2021-11-26 ENCOUNTER — Other Ambulatory Visit: Payer: Self-pay

## 2021-11-26 VITALS — BP 106/69 | HR 94 | Ht 74.0 in | Wt 172.4 lb

## 2021-11-26 DIAGNOSIS — E876 Hypokalemia: Secondary | ICD-10-CM

## 2021-11-26 DIAGNOSIS — D649 Anemia, unspecified: Secondary | ICD-10-CM

## 2021-11-26 DIAGNOSIS — Z91148 Patient's other noncompliance with medication regimen for other reason: Secondary | ICD-10-CM

## 2021-11-26 DIAGNOSIS — E1165 Type 2 diabetes mellitus with hyperglycemia: Secondary | ICD-10-CM | POA: Diagnosis not present

## 2021-11-26 DIAGNOSIS — Z1211 Encounter for screening for malignant neoplasm of colon: Secondary | ICD-10-CM

## 2021-11-26 DIAGNOSIS — Z794 Long term (current) use of insulin: Secondary | ICD-10-CM

## 2021-11-26 MED ORDER — ATORVASTATIN CALCIUM 40 MG PO TABS
40.0000 mg | ORAL_TABLET | Freq: Every day | ORAL | 1 refills | Status: DC
  Filled 2021-11-26: qty 30, 30d supply, fill #0

## 2021-11-26 NOTE — Progress Notes (Signed)
? ?Subjective:  ?Patient ID: Stanley Edwards, male    DOB: November 21, 1965  Age: 56 y.o. MRN: [id number] ? ?CC: Hospitalization Follow-up ? ? ?HPI ?Stanley Edwards is a 56 y.o. year old male with a history of Type 2 Diabetes Mellitus (A1c 11.8 ), recurrent hospitalizations for DKA. ?He was hospitalized with DKA at [facility] from 11/02/2021 through 11/07/2021.  His insulin regimen was changed prior to discharge. ?Endorses eating a lot of fast foods which had led to his hyperglycemia. ? ?Interval History: ?His sugars have been over 200 only on 2 occassions per patient. ?He has been administering Humalog 15 units 3 times daily but never started Basaglar 30 units which was prescribed at bedtime ?Blood sugars have been 180 fasting and 140 random ?He has also not been taking his atorvastatin. ? ?Discharge labs had revealed anemia with a hemoglobin of 9.0.  He endorses previous history of bright red blood in his stool which has since resolved.  He has been constipated on some occasions.  He is not up-to-date on colorectal cancer screening. ? ?Today he informs me he has paperwork that needs to be completed for his disability at work.  I did complete a similar paperwork for him in 07/2021. ?He has not worked since 06/2021 and states since then he has only received 1 paycheck. ?Past Medical History:  ?Diagnosis Date  ? Allergy   ? Diabetes mellitus without complication (HCC)   ? Hypertension   ? Pt denies htn but it was on his previous hx  ? ? ?History reviewed. No pertinent surgical history. ? ?Family History  ?Problem Relation Age of Onset  ? Cancer Mother   ? Diabetes Father   ? ? ?Social History  ? ?Socioeconomic History  ? Marital status: Single  ?  Spouse name: Not on file  ? Number of children: Not on file  ? Years of education: Not on file  ? Highest education level: Not on file  ?Occupational History  ? Not on file  ?Tobacco Use  ? Smoking status: Former  ?  Types: Cigarettes  ? Smokeless tobacco: Never  ?Substance  and Sexual Activity  ? Alcohol use: Yes  ?  Alcohol/week: 1.0 standard drink  ?  Types: 1 Cans of beer per week  ? Drug use: No  ? Sexual activity: Yes  ?Other Topics Concern  ? Not on file  ?Social History Narrative  ? Not on file  ? ?Social Determinants of Health  ? ?Financial Resource Strain: Not on file  ?Food Insecurity: Not on file  ?Transportation Needs: Not on file  ?Physical Activity: Not on file  ?Stress: Not on file  ?Social Connections: Not on file  ? ? ?No Known Allergies ? ?Outpatient Medications Prior to Visit  ?Medication Sig Dispense Refill  ? glucose blood (TRUE METRIX BLOOD GLUCOSE TEST) test strip Use 3 times daily before meals 100 each 12  ? Insulin Glargine (BASAGLAR KWIKPEN) 100 UNIT/ML Inject 30 Units into the skin daily.    ? Insulin Pen Needle 32G X 4 MM MISC Use to inject insulin 4 times daily. 100 each 0  ? Blood Glucose Monitoring Suppl (TRUE METRIX METER) DEVI 1 each by Does not apply route 3 (three) times daily before meals. (Patient not taking: Reported on 08/01/2021) 1 each 0  ? insulin glargine (LANTUS) 100 UNIT/ML Solostar Pen Inject 30 Units into the skin daily. (Patient not taking: Reported on 11/26/2021) 9 mL 0  ? insulin lispro (HUMALOG) 100 UNIT/ML  KwikPen Inject 15 Units into the skin 3 (three) times daily with meals. (Patient not taking: Reported on 11/26/2021) 15 mL 0  ? atorvastatin (LIPITOR) 20 MG tablet Take 1 tablet (20 mg total) by mouth daily. (Patient not taking: Reported on 11/02/2021) 30 tablet 1  ? ?No facility-administered medications prior to visit.  ? ? ? ?ROS ?Review of Systems  ?Constitutional:  Negative for activity change and appetite change.  ?HENT:  Negative for sinus pressure and sore throat.   ?Eyes:  Negative for visual disturbance.  ?Respiratory:  Negative for cough, chest tightness and shortness of breath.   ?Cardiovascular:  Negative for chest pain and leg swelling.  ?Gastrointestinal:  Negative for abdominal distention, abdominal pain, constipation and  diarrhea.  ?Endocrine: Negative.   ?Genitourinary:  Negative for dysuria.  ?Musculoskeletal:  Negative for joint swelling and myalgias.  ?Skin:  Negative for rash.  ?Allergic/Immunologic: Negative.   ?Neurological:  Positive for numbness. Negative for weakness and light-headedness.  ?Psychiatric/Behavioral:  Negative for dysphoric mood and suicidal ideas.   ? ?Objective:  ?BP 106/69   Pulse 94   Ht 6\' 2"  (1.88 m)   Wt 172 lb 6.4 oz (78.2 kg)   SpO2 100%   BMI 22.13 kg/m?  ? ? ?  11/26/2021  ? 10:21 AM 11/07/2021  ?  9:51 AM 11/07/2021  ?  5:24 AM  ?BP/Weight  ?Systolic BP 106 97 96  ?Diastolic BP 69 62 70  ?Wt. (Lbs) 172.4    ?BMI 22.13 kg/m2    ? ? ? ? ?Physical Exam ?Constitutional:   ?   Appearance: He is well-developed.  ?Cardiovascular:  ?   Rate and Rhythm: Normal rate.  ?   Heart sounds: Normal heart sounds. No murmur heard. ?Pulmonary:  ?   Effort: Pulmonary effort is normal.  ?   Breath sounds: Normal breath sounds. No wheezing or rales.  ?Chest:  ?   Chest wall: No tenderness.  ?Abdominal:  ?   General: Bowel sounds are normal. There is no distension.  ?   Palpations: Abdomen is soft. There is no mass.  ?   Tenderness: There is no abdominal tenderness.  ?Musculoskeletal:     ?   General: Normal range of motion.  ?   Right lower leg: No edema.  ?   Left lower leg: No edema.  ?Neurological:  ?   Mental Status: He is alert and oriented to person, place, and time.  ?Psychiatric:     ?   Mood and Affect: Mood normal.  ? ?Diabetic Foot Exam - Simple   ?Simple Foot Form ?Diabetic Foot exam was performed with the following findings: Yes 11/26/2021 11:07 AM  ?Visual Inspection ?No deformities, no ulcerations, no other skin breakdown bilaterally: Yes ?Sensation Testing ?Intact to touch and monofilament testing bilaterally: Yes ?Pulse Check ?Posterior Tibialis and Dorsalis pulse intact bilaterally: Yes ?Comments ?  ? ? ? ?  Latest Ref Rng & Units 11/07/2021  ?  3:33 AM 11/06/2021  ?  2:55 AM 11/05/2021  ?  6:57 AM  ?CMP   ?Glucose 70 - 99 mg/dL 161171   096222   045210    ?BUN 6 - 20 mg/dL 16   17   13     ?Creatinine 0.61 - 1.24 mg/dL 4.090.94   8.110.97   9.141.04    ?Sodium 135 - 145 mmol/L 137   141   143    ?Potassium 3.5 - 5.1 mmol/L 3.3   3.4   3.2    ?  Chloride 98 - 111 mmol/L 103   108   108    ?CO2 22 - 32 mmol/L 31   27   29     ?Calcium 8.9 - 10.3 mg/dL 8.4   8.3   8.7    ?Total Protein 6.5 - 8.1 g/dL 5.1   4.8   6.0    ?Total Bilirubin 0.3 - 1.2 mg/dL 0.6   0.9   1.6    ?Alkaline Phos 38 - 126 U/L 52   52   63    ?AST 15 - 41 U/L 30   33   35    ?ALT 0 - 44 U/L 30   30   33    ? ? ?Lipid Panel  ?   ?Component Value Date/Time  ? CHOL 347 (H) 08/01/2021 1202  ? TRIG 100 08/01/2021 1202  ? HDL 77 08/01/2021 1202  ? CHOLHDL 4.2 01/23/2018 1017  ? CHOLHDL 4.7 09/21/2015 0940  ? VLDL 27 09/21/2015 0940  ? LDLCALC 254 (H) 08/01/2021 1202  ? ? ?CBC ?   ?Component Value Date/Time  ? WBC 8.4 11/05/2021 0657  ? RBC 2.56 (L) 11/05/2021 0657  ? HGB 9.0 (L) 11/05/2021 0657  ? HGB 12.4 (L) 01/23/2018 1017  ? HCT 27.6 (L) 11/05/2021 0657  ? HCT 36.7 (L) 01/23/2018 1017  ? PLT 143 (L) 11/05/2021 0657  ? PLT 291 01/23/2018 1017  ? MCV 107.8 (H) 11/05/2021 0657  ? MCV 100 (H) 01/23/2018 1017  ? MCH 35.2 (H) 11/05/2021 01/05/2022  ? MCHC 32.6 11/05/2021 0657  ? RDW 12.3 11/05/2021 0657  ? RDW 12.3 01/23/2018 1017  ? LYMPHSABS 1.4 11/05/2021 0657  ? LYMPHSABS 1.4 01/23/2018 1017  ? MONOABS 0.3 11/05/2021 0657  ? EOSABS 0.2 11/05/2021 0657  ? EOSABS 0.2 01/23/2018 1017  ? BASOSABS 0.0 11/05/2021 0657  ? BASOSABS 0.0 01/23/2018 1017  ? ? ?Lab Results  ?Component Value Date  ? HGBA1C 11.8 (H) 11/02/2021  ? ? ?Assessment & Plan:  ?1. Type 2 diabetes mellitus with hyperglycemia, with long-term current use of insulin (HCC) ?Uncontrolled with A1c of 11.8; goal is less than 7.0 ?Medication nonadherence and not adherent to a diabetic diet largely contributory ?He is yet to begin Basaglar ?I am concerned that once he starts the Basaglar and administers this in addition to NovoLog  15 units 3 times daily he might become hypoglycemic.  Advised to begin Basaglar 30 units tonight and tomorrow he will resume Humalog sliding scale.  I will have him return in 1 week so we can review his blood

## 2021-11-27 ENCOUNTER — Telehealth: Payer: Self-pay

## 2021-11-27 LAB — CBC WITH DIFFERENTIAL/PLATELET
Basophils Absolute: 0.1 10*3/uL (ref 0.0–0.2)
Basos: 1 %
EOS (ABSOLUTE): 0.3 10*3/uL (ref 0.0–0.4)
Eos: 8 %
Hematocrit: 35.9 % — ABNORMAL LOW (ref 37.5–51.0)
Hemoglobin: 11.8 g/dL — ABNORMAL LOW (ref 13.0–17.7)
Immature Grans (Abs): 0 10*3/uL (ref 0.0–0.1)
Immature Granulocytes: 0 %
Lymphocytes Absolute: 1.4 10*3/uL (ref 0.7–3.1)
Lymphs: 39 %
MCH: 35.2 pg — ABNORMAL HIGH (ref 26.6–33.0)
MCHC: 32.9 g/dL (ref 31.5–35.7)
MCV: 107 fL — ABNORMAL HIGH (ref 79–97)
Monocytes Absolute: 0.3 10*3/uL (ref 0.1–0.9)
Monocytes: 9 %
Neutrophils Absolute: 1.5 10*3/uL (ref 1.4–7.0)
Neutrophils: 43 %
Platelets: 313 10*3/uL (ref 150–450)
RBC: 3.35 x10E6/uL — ABNORMAL LOW (ref 4.14–5.80)
RDW: 10.9 % — ABNORMAL LOW (ref 11.6–15.4)
WBC: 3.5 10*3/uL (ref 3.4–10.8)

## 2021-11-27 LAB — BASIC METABOLIC PANEL
BUN/Creatinine Ratio: 15 (ref 9–20)
BUN: 20 mg/dL (ref 6–24)
CO2: 24 mmol/L (ref 20–29)
Calcium: 10.5 mg/dL — ABNORMAL HIGH (ref 8.7–10.2)
Chloride: 91 mmol/L — ABNORMAL LOW (ref 96–106)
Creatinine, Ser: 1.37 mg/dL — ABNORMAL HIGH (ref 0.76–1.27)
Glucose: 515 mg/dL (ref 70–99)
Potassium: 4.5 mmol/L (ref 3.5–5.2)
Sodium: 132 mmol/L — ABNORMAL LOW (ref 134–144)
eGFR: 61 mL/min/{1.73_m2} (ref >59)

## 2021-11-27 NOTE — Telephone Encounter (Signed)
Thanks, noted. Result note was already placed early this morning.

## 2021-11-27 NOTE — Telephone Encounter (Signed)
Patience with [facility] calling with Critical glucose 515. Message sent to [facility]. ?

## 2021-12-05 ENCOUNTER — Ambulatory Visit: Payer: Managed Care, Other (non HMO) | Admitting: Family Medicine

## 2021-12-09 ENCOUNTER — Other Ambulatory Visit: Payer: Self-pay

## 2021-12-09 ENCOUNTER — Ambulatory Visit: Payer: Self-pay | Admitting: Family Medicine

## 2021-12-09 ENCOUNTER — Encounter: Payer: Self-pay | Admitting: Family Medicine

## 2021-12-09 ENCOUNTER — Ambulatory Visit: Payer: Commercial Managed Care - HMO | Attending: Family Medicine | Admitting: Family Medicine

## 2021-12-09 VITALS — BP 132/79 | HR 100 | Ht 74.0 in | Wt 196.6 lb

## 2021-12-09 DIAGNOSIS — Z029 Encounter for administrative examinations, unspecified: Secondary | ICD-10-CM | POA: Diagnosis not present

## 2021-12-09 DIAGNOSIS — E1165 Type 2 diabetes mellitus with hyperglycemia: Secondary | ICD-10-CM | POA: Diagnosis not present

## 2021-12-09 DIAGNOSIS — Z794 Long term (current) use of insulin: Secondary | ICD-10-CM

## 2021-12-09 LAB — GLUCOSE, POCT (MANUAL RESULT ENTRY): POC Glucose: 228 mg/dl — AB (ref 70–99)

## 2021-12-09 MED ORDER — INSULIN LISPRO (1 UNIT DIAL) 100 UNIT/ML (KWIKPEN)
0.0000 [IU] | PEN_INJECTOR | Freq: Three times a day (TID) | SUBCUTANEOUS | 0 refills | Status: DC
  Filled 2021-12-09: qty 9, 25d supply, fill #0
  Filled 2022-01-02: qty 6, 17d supply, fill #1

## 2021-12-09 NOTE — Progress Notes (Signed)
? ?Subjective:  ?Patient ID: Stanley Edwards, male    DOB: [birth year]  Age: 56 y.o. MRN: [id number] ? ?CC: Diabetes ? ? ?HPI ?[name] is a 56 y.o. year old male with a history of Type 2 Diabetes Mellitus (A1c 11.8 ), recurrent hospitalizations for DKA. ? ?Interval History: ?He informs me he administers 20 or 30 units of Basaglar depending on 'how his sugar is'. If he has a sugar of 110 he would administer 20 units and then goes on to say a couple of hours later he would administer the remaining 10 units. ?He has always administered Novolog 15 units units with his meals he says.  At his last visit his blood sugar in the clinic was over 500 and he had not picked up his Basaglar but had been administering NovoLog 15 units 3 times daily.  The concern was that once he restarted Basaglar if he continues with NovoLog 15 units 3 times daily he could become hypoglycemic and so he was changed from fixed NovoLog regimen to NovoLog sliding scale in addition to 30 units of Basaglar until his visit today. ?On review of his blood sugar log from his freestyle libre glucometer the last recorded blood sugar was from 11/11/2021 and all the readings of HI ? ?Today he was also supposed to have his fitness for duty certification completed. 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 Allergy   ? Diabetes mellitus without complication (HCC)   ? Hypertension   ? Pt denies htn but it was on his previous hx  ? ? ?History reviewed. No pertinent surgical history. ? ?Family History  ?Problem Relation Age of Onset  ? Cancer Mother   ? Diabetes Father   ? ? ?Social History  ? ?Socioeconomic History  ? Marital status: Single  ?  Spouse name: Not on file  ? Number of children: Not on file  ? Years of education: Not on file  ? Highest education level: Not on file  ?Occupational History  ? Not on file  ?Tobacco Use  ? Smoking status: Former  ?  Types: Cigarettes  ? Smokeless tobacco: Never   ?Substance and Sexual Activity  ? Alcohol use: Yes  ?  Alcohol/week: 1.0 standard drink  ?  Types: 1 Cans of beer per week  ? Drug use: No  ? Sexual activity: Yes  ?Other Topics Concern  ? Not on file  ?Social History Narrative  ? Not on file  ? ?Social Determinants of Health  ? ?Financial Resource Strain: Not on file  ?Food Insecurity: Not on file  ?Transportation Needs: Not on file  ?Physical Activity: Not on file  ?Stress: Not on file  ?Social Connections: Not on file  ? ? ?No Known Allergies ? ?Outpatient Medications Prior to Visit  ?Medication Sig Dispense Refill  ? atorvastatin (LIPITOR) 40 MG tablet Take 1 tablet (40 mg total) by mouth daily. 90 tablet 1  ? Blood Glucose Monitoring Suppl (TRUE METRIX METER) DEVI 1 each by Does not apply route 3 (three) times daily before meals. 1 each 0  ? glucose blood (TRUE METRIX BLOOD GLUCOSE TEST) test strip Use 3 times daily before meals 100 each 12  ? Insulin Glargine (BASAGLAR KWIKPEN) 100 UNIT/ML Inject 30 Units into the skin daily.    ? insulin glargine (LANTUS) 100 UNIT/ML Solostar Pen Inject 30 Units into the skin daily. 9 mL 0  ? Insulin Pen Needle 32G  X 4 MM MISC Use to inject insulin 4 times daily. 100 each 0  ? insulin lispro (HUMALOG) 100 UNIT/ML KwikPen Inject 15 Units into the skin 3 (three) times daily with meals. 15 mL 0  ? ?No facility-administered medications prior to visit.  ? ? ? ?ROS ?Review of Systems  ?Constitutional:  Negative for activity change and appetite change.  ?HENT:  Negative for sinus pressure and sore throat.   ?Respiratory:  Negative for chest tightness, shortness of breath and wheezing.   ?Cardiovascular:  Negative for chest pain and palpitations.  ?Gastrointestinal:  Negative for abdominal distention, abdominal pain and constipation.  ?Genitourinary: Negative.   ?Musculoskeletal: Negative.   ?Psychiatric/Behavioral:  Negative for behavioral problems and dysphoric mood.   ? ?Objective:  ?BP 132/79   Pulse 100   Ht 6\' 2"  (1.88 m)    Wt 196 lb 9.6 oz (89.2 kg)   SpO2 99%   BMI 25.24 kg/m?  ? ? ?  12/09/2021  ?  4:22 PM 11/26/2021  ? 10:21 AM 11/07/2021  ?  9:51 AM  ?BP/Weight  ?Systolic BP 132 106 97  ?Diastolic BP 79 69 62  ?Wt. (Lbs) 196.6 172.4   ?BMI 25.24 kg/m2 22.13 kg/m2   ? ? ? ? ?Physical Exam ?Constitutional:   ?   Appearance: He is well-developed.  ?Cardiovascular:  ?   Rate and Rhythm: Normal rate.  ?   Heart sounds: Normal heart sounds. No murmur heard. ?Pulmonary:  ?   Effort: Pulmonary effort is normal.  ?   Breath sounds: Normal breath sounds. No wheezing or rales.  ?Chest:  ?   Chest wall: No tenderness.  ?Abdominal:  ?   General: Bowel sounds are normal. There is no distension.  ?   Palpations: Abdomen is soft. There is no mass.  ?   Tenderness: There is no abdominal tenderness.  ?Musculoskeletal:     ?   General: Normal range of motion.  ?   Right lower leg: No edema.  ?   Left lower leg: No edema.  ?Neurological:  ?   Mental Status: He is alert and oriented to person, place, and time.  ?Psychiatric:     ?   Mood and Affect: Mood normal.  ? ? ? ?  Latest Ref Rng & Units 11/26/2021  ? 11:22 AM 11/07/2021  ?  3:33 AM 11/06/2021  ?  2:55 AM  ?CMP  ?Glucose 70 - 99 mg/dL 01/06/2022   562   130    ?BUN 6 - 24 mg/dL 20   16   17     ?Creatinine 0.76 - 1.27 mg/dL 865     7.84    ?Sodium 134 - 144 mmol/L 132   137   141    ?Potassium 3.5 - 5.2 mmol/L 4.5   3.3   3.4    ?Chloride 96 - 106 mmol/L 91   103   108    ?CO2 20 - 29 mmol/L 24   31   27     ?Calcium 8.7 - 10.2 mg/dL 6.96   8.4   8.3    ?Total Protein 6.5 - 8.1 g/dL  5.1   4.8    ?Total Bilirubin 0.3 - 1.2 mg/dL  0.6   0.9    ?Alkaline Phos 38 - 126 U/L  52   52    ?AST 15 - 41 U/L  30   33    ?ALT 0 - 44 U/L  30  30    ? ? ?Lipid Panel  ?   ?Component Value Date/Time  ? CHOL 347 (H) 08/01/2021 1202  ? TRIG 100 08/01/2021 1202  ? HDL 77 08/01/2021 1202  ? CHOLHDL 4.2 01/23/2018 1017  ? CHOLHDL 4.7 09/21/2015 0940  ? VLDL 27 09/21/2015 0940  ? LDLCALC 254 (H) 08/01/2021 1202  ? ? ?CBC ?    ?Component Value Date/Time  ? WBC 3.5 11/26/2021 1122  ? WBC 8.4 11/05/2021 0657  ? RBC 3.35 (L) 11/26/2021 1122  ? RBC 2.56 (L) 11/05/2021 0657  ? HGB 11.8 (L) 11/26/2021 1122  ? HCT 35.9 (L) 11/26/2021 1122  ? PLT 313 11/26/2021 1122  ? MCV 107 (H) 11/26/2021 1122  ? MCH 35.2 (H) 11/26/2021 1122  ? MCH 35.2 (H) 11/05/2021 [id number]  ? MCHC 32.9 11/26/2021 1122  ? MCHC 32.6 11/05/2021 0657  ? RDW 10.9 (L) 11/26/2021 1122  ? LYMPHSABS 1.4 11/26/2021 1122  ? MONOABS 0.3 11/05/2021 0657  ? EOSABS 0.3 11/26/2021 1122  ? BASOSABS 0.1 11/26/2021 1122  ? ? ?Lab Results  ?Component Value Date  ? HGBA1C 11.8 (H) 11/02/2021  ? ? ?Assessment & Plan:  ?1. Type 2 diabetes mellitus with hyperglycemia, with long-term current use of insulin (HCC) ?Uncontrolled with A1c of 11.8; goal is less than 7.0 ?I have read your iterated to him again and again that he needs to administer the NovoLog sliding scale to prevent hypoglycemia and stick to 30 units of Basaglar nightly but on teach back he repeats that he is to administer 15 units 3 times daily of NovoLog. ?I have explained to him the fact that NovoLog is short acting and likely to cause hypoglycemia and we will need him to adhere to his dose of long-acting insulin and at his next visit reassess the need to go up on his Basaglar meanwhile he needs to continue with the NovoLog sliding scale. ?His glucometer reveals last reading from 11/11/2021 which is a month ago so I am unsure of how he is obtaining blood sugar readings ?I am not sure if he does have some comprehension issues ?We will have him follow-up with the clinical pharmacist in 1 week to review his blood sugar logs ?Counseled on blood pressure goal of less than 130/80, low-sodium, DASH diet, medication compliance, 150 minutes of moderate intensity exercise per week. ?Discussed medication compliance, adverse effects. ? ?- POCT glucose (manual entry) ?- insulin lispro (HUMALOG) 100 UNIT/ML KwikPen; Inject 0-12 Units into the skin 3  (three) times daily with meals. Per Sliding scale  Dispense: 15 mL; Refill: 0 ? ?2. Administrative encounter ?He is able to return to work right away as blood sugars have improved but he goes on to complain abou

## 2022-01-02 ENCOUNTER — Telehealth: Payer: Self-pay | Admitting: Family Medicine

## 2022-01-02 ENCOUNTER — Other Ambulatory Visit: Payer: Self-pay

## 2022-01-02 NOTE — Telephone Encounter (Signed)
Pt dropped of paperwork

## 2022-01-02 NOTE — Telephone Encounter (Signed)
Paperwork has been received and patient will be called once ready for pick up. 

## 2022-01-06 ENCOUNTER — Other Ambulatory Visit: Payer: Self-pay | Admitting: Family Medicine

## 2022-01-06 NOTE — Telephone Encounter (Signed)
Medication Refill - Medication: insulin glargine (LANTUS) 100 UNIT/ML Solostar   Has the patient contacted their pharmacy? No. (Agent: If no, request that the patient contact the pharmacy for the refill. If patient does not wish to contact the pharmacy document the reason why and proceed with request.) (Agent: If yes, when and what did the pharmacy advise?)  Preferred Pharmacy (with phone number or street name):  [facility] Phone:  [phone]  Fax:  [phone]     Has the patient been seen for an appointment in the last year OR does the patient have an upcoming appointment? Yes   Agent: Please be advised that RX refills may take up to 3 business days. We ask that you follow-up with your pharmacy.

## 2022-01-06 NOTE — Telephone Encounter (Signed)
Called pt to let him know that paperwork time is 7-10 business days. He wanted me to ask anyway if you had done it already maybe ( I told him I can see, no guarantee made)

## 2022-01-06 NOTE — Telephone Encounter (Signed)
Pt called about the paperwork to return to work today and wanted to see if it was ready today / please advise asap

## 2022-01-07 ENCOUNTER — Other Ambulatory Visit: Payer: Self-pay

## 2022-01-07 MED ORDER — BASAGLAR KWIKPEN 100 UNIT/ML ~~LOC~~ SOPN
30.0000 [IU] | PEN_INJECTOR | Freq: Every day | SUBCUTANEOUS | 2 refills | Status: DC
  Filled 2022-01-07 – 2022-02-10 (×2): qty 9, 30d supply, fill #0

## 2022-01-07 NOTE — Telephone Encounter (Signed)
Requested medication (s) are due for refill today: Yes  Requested medication (s) are on the active medication list: Yes  Last refill:  11/07/21  Future visit scheduled: Yes  Notes to clinic:  Last filled by Dr. Carolyne Littles.    Requested Prescriptions  Pending Prescriptions Disp Refills   insulin glargine (LANTUS) 100 UNIT/ML Solostar Pen 9 mL 0    Sig: Inject 30 Units into the skin daily.     Endocrinology:  Diabetes - Insulins Failed - 01/06/2022  4:21 PM      Failed - HBA1C is between 0 and 7.9 and within 180 days    HbA1c, POC (controlled diabetic range)  Date Value Ref Range Status  02/11/2021 6.2 0.0 - 7.0 % Final   Hgb A1c MFr Bld  Date Value Ref Range Status  11/02/2021 11.8 (H) 4.8 - 5.6 % Final    Comment:    (NOTE) Pre diabetes:          5.7%-6.4%  Diabetes:              >6.4%  Glycemic control for   <7.0% adults with diabetes          Passed - Valid encounter within last 6 months    Recent Outpatient Visits           4 weeks ago Type 2 diabetes mellitus with hyperglycemia, with long-term current use of insulin (HCC)   [facility] [place], Odette Horns, MD   1 month ago Screening for colon cancer   [facility] [place], Odette Horns, MD   5 months ago Administrative encounter   [facility] [place], Odette Horns, MD   5 months ago Type 2 diabetes mellitus without complication, with long-term current use of insulin [facility])   [facility], Gildardo Pounds, NP   11 months ago Type 2 diabetes mellitus with hyperglycemia, with long-term current use of insulin [facility])   [facility] Hoy Register, MD       Future Appointments             In 1 week Lois Huxley, Cornelius Moras, RPH-CPP [facility]   In 2 months Hoy Register, MD [facility]

## 2022-01-08 NOTE — Telephone Encounter (Signed)
Paperwork has not been completed as of this time>CMA will reach out to patient once ready.

## 2022-01-09 NOTE — Telephone Encounter (Signed)
Pt called to ck on paperwork, will cb.

## 2022-01-10 NOTE — Telephone Encounter (Signed)
Pt has been informed that paperwork is ready for pick up.

## 2022-01-14 ENCOUNTER — Other Ambulatory Visit: Payer: Self-pay

## 2022-01-14 ENCOUNTER — Ambulatory Visit: Payer: Commercial Managed Care - HMO | Admitting: Pharmacist

## 2022-02-04 ENCOUNTER — Telehealth: Payer: Self-pay | Admitting: *Deleted

## 2022-02-04 NOTE — Telephone Encounter (Signed)
No return call received no show letter mailed procedure cancelled.

## 2022-02-04 NOTE — Telephone Encounter (Signed)
Attempted to call x 2 message left with call back # to return call by 5 P.M. today to reschedule pre-visit or upcoming procedure on 02/25/22 will be cancelled.

## 2022-02-10 ENCOUNTER — Other Ambulatory Visit: Payer: Self-pay

## 2022-02-17 ENCOUNTER — Other Ambulatory Visit: Payer: Self-pay

## 2022-02-21 ENCOUNTER — Inpatient Hospital Stay (HOSPITAL_COMMUNITY)
Admission: EM | Admit: 2022-02-21 | Discharge: 2022-02-24 | DRG: 637 | Disposition: A | Discharge status: Home / Self Care | Payer: Commercial Managed Care - HMO | Attending: Internal Medicine | Admitting: Internal Medicine

## 2022-02-21 ENCOUNTER — Emergency Department (HOSPITAL_COMMUNITY): Payer: Commercial Managed Care - HMO

## 2022-02-21 ENCOUNTER — Other Ambulatory Visit: Payer: Self-pay

## 2022-02-21 ENCOUNTER — Encounter (HOSPITAL_COMMUNITY): Payer: Self-pay | Admitting: Emergency Medicine

## 2022-02-21 DIAGNOSIS — I10 Essential (primary) hypertension: Secondary | ICD-10-CM | POA: Diagnosis present

## 2022-02-21 DIAGNOSIS — I959 Hypotension, unspecified: Secondary | ICD-10-CM | POA: Diagnosis present

## 2022-02-21 DIAGNOSIS — R400 Somnolence: Principal | ICD-10-CM

## 2022-02-21 DIAGNOSIS — E1169 Type 2 diabetes mellitus with other specified complication: Secondary | ICD-10-CM | POA: Diagnosis present

## 2022-02-21 DIAGNOSIS — E119 Type 2 diabetes mellitus without complications: Secondary | ICD-10-CM

## 2022-02-21 DIAGNOSIS — G9341 Metabolic encephalopathy: Secondary | ICD-10-CM | POA: Diagnosis present

## 2022-02-21 DIAGNOSIS — D7589 Other specified diseases of blood and blood-forming organs: Secondary | ICD-10-CM | POA: Diagnosis present

## 2022-02-21 DIAGNOSIS — E11649 Type 2 diabetes mellitus with hypoglycemia without coma: Secondary | ICD-10-CM | POA: Diagnosis not present

## 2022-02-21 DIAGNOSIS — E861 Hypovolemia: Secondary | ICD-10-CM | POA: Diagnosis present

## 2022-02-21 DIAGNOSIS — E1165 Type 2 diabetes mellitus with hyperglycemia: Secondary | ICD-10-CM

## 2022-02-21 DIAGNOSIS — D72829 Elevated white blood cell count, unspecified: Secondary | ICD-10-CM | POA: Diagnosis present

## 2022-02-21 DIAGNOSIS — E1110 Type 2 diabetes mellitus with ketoacidosis without coma: Principal | ICD-10-CM | POA: Diagnosis present

## 2022-02-21 DIAGNOSIS — E870 Hyperosmolality and hypernatremia: Secondary | ICD-10-CM | POA: Diagnosis present

## 2022-02-21 DIAGNOSIS — E785 Hyperlipidemia, unspecified: Secondary | ICD-10-CM | POA: Diagnosis present

## 2022-02-21 DIAGNOSIS — Z794 Long term (current) use of insulin: Secondary | ICD-10-CM | POA: Diagnosis not present

## 2022-02-21 DIAGNOSIS — Z87891 Personal history of nicotine dependence: Secondary | ICD-10-CM | POA: Diagnosis not present

## 2022-02-21 DIAGNOSIS — R0902 Hypoxemia: Secondary | ICD-10-CM | POA: Diagnosis present

## 2022-02-21 DIAGNOSIS — E875 Hyperkalemia: Secondary | ICD-10-CM | POA: Diagnosis present

## 2022-02-21 DIAGNOSIS — N179 Acute kidney failure, unspecified: Secondary | ICD-10-CM | POA: Diagnosis present

## 2022-02-21 DIAGNOSIS — Z91148 Patient's other noncompliance with medication regimen for other reason: Secondary | ICD-10-CM

## 2022-02-21 DIAGNOSIS — Z79899 Other long term (current) drug therapy: Secondary | ICD-10-CM | POA: Diagnosis not present

## 2022-02-21 LAB — GLUCOSE, CAPILLARY
Glucose-Capillary: >600 mg/dL (ref 70–99)
Glucose-Capillary: >600 mg/dL (ref 70–99)
Glucose-Capillary: >600 mg/dL (ref 70–99)
Glucose-Capillary: 499 mg/dL — ABNORMAL HIGH (ref 70–99)
Glucose-Capillary: 564 mg/dL (ref 70–99)
Glucose-Capillary: 443 mg/dL — ABNORMAL HIGH (ref 70–99)
Glucose-Capillary: 483 mg/dL — ABNORMAL HIGH (ref 70–99)
Glucose-Capillary: 401 mg/dL — ABNORMAL HIGH (ref 70–99)
Glucose-Capillary: 366 mg/dL — ABNORMAL HIGH (ref 70–99)
Glucose-Capillary: 304 mg/dL — ABNORMAL HIGH (ref 70–99)
Glucose-Capillary: 305 mg/dL — ABNORMAL HIGH (ref 70–99)

## 2022-02-21 LAB — COMPREHENSIVE METABOLIC PANEL
ALT: 14 U/L (ref 0–44)
AST: 10 U/L — ABNORMAL LOW (ref 15–41)
Albumin: 4.5 g/dL (ref 3.5–5.0)
Alkaline Phosphatase: 96 U/L (ref 38–126)
Anion gap: 33 — ABNORMAL HIGH (ref 5–15)
BUN: 80 mg/dL — ABNORMAL HIGH (ref 6–20)
CO2: 8 mmol/L — ABNORMAL LOW (ref 22–32)
Calcium: 9.7 mg/dL (ref 8.9–10.3)
Chloride: 102 mmol/L (ref 98–111)
Creatinine, Ser: 4.17 mg/dL — ABNORMAL HIGH (ref 0.61–1.24)
GFR, Estimated: 16 mL/min — ABNORMAL LOW (ref >60)
Glucose, Bld: 867 mg/dL (ref 70–99)
Potassium: 6.5 mmol/L (ref 3.5–5.1)
Sodium: 143 mmol/L (ref 135–145)
Total Bilirubin: 2.7 mg/dL — ABNORMAL HIGH (ref 0.3–1.2)
Total Protein: 9.1 g/dL — ABNORMAL HIGH (ref 6.5–8.1)

## 2022-02-21 LAB — BASIC METABOLIC PANEL
Anion gap: 29 — ABNORMAL HIGH (ref 5–15)
Anion gap: 22 — ABNORMAL HIGH (ref 5–15)
Anion gap: 16 — ABNORMAL HIGH (ref 5–15)
BUN: 81 mg/dL — ABNORMAL HIGH (ref 6–20)
BUN: 78 mg/dL — ABNORMAL HIGH (ref 6–20)
BUN: 72 mg/dL — ABNORMAL HIGH (ref 6–20)
CO2: 7 mmol/L — ABNORMAL LOW (ref 22–32)
CO2: 13 mmol/L — ABNORMAL LOW (ref 22–32)
CO2: 18 mmol/L — ABNORMAL LOW (ref 22–32)
Calcium: 8.6 mg/dL — ABNORMAL LOW (ref 8.9–10.3)
Calcium: 9.5 mg/dL (ref 8.9–10.3)
Calcium: 9.5 mg/dL (ref 8.9–10.3)
Chloride: 111 mmol/L (ref 98–111)
Chloride: 115 mmol/L — ABNORMAL HIGH (ref 98–111)
Chloride: 120 mmol/L — ABNORMAL HIGH (ref 98–111)
Creatinine, Ser: 4 mg/dL — ABNORMAL HIGH (ref 0.61–1.24)
Creatinine, Ser: 3.8 mg/dL — ABNORMAL HIGH (ref 0.61–1.24)
Creatinine, Ser: 2.98 mg/dL — ABNORMAL HIGH (ref 0.61–1.24)
GFR, Estimated: 17 mL/min — ABNORMAL LOW (ref >60)
GFR, Estimated: 18 mL/min — ABNORMAL LOW (ref >60)
GFR, Estimated: 24 mL/min — ABNORMAL LOW (ref >60)
Glucose, Bld: 803 mg/dL (ref 70–99)
Glucose, Bld: 580 mg/dL (ref 70–99)
Glucose, Bld: 379 mg/dL — ABNORMAL HIGH (ref 70–99)
Potassium: 5.3 mmol/L — ABNORMAL HIGH (ref 3.5–5.1)
Potassium: 4.5 mmol/L (ref 3.5–5.1)
Potassium: 4 mmol/L (ref 3.5–5.1)
Sodium: 147 mmol/L — ABNORMAL HIGH (ref 135–145)
Sodium: 150 mmol/L — ABNORMAL HIGH (ref 135–145)
Sodium: 154 mmol/L — ABNORMAL HIGH (ref 135–145)

## 2022-02-21 LAB — MRSA NEXT GEN BY PCR, NASAL: MRSA by PCR Next Gen: NOT DETECTED

## 2022-02-21 LAB — CBC WITH DIFFERENTIAL/PLATELET
Abs Immature Granulocytes: 0.08 10*3/uL — ABNORMAL HIGH (ref 0.00–0.07)
Basophils Absolute: 0 10*3/uL (ref 0.0–0.1)
Basophils Relative: 0 %
Eosinophils Absolute: 0 10*3/uL (ref 0.0–0.5)
Eosinophils Relative: 0 %
HCT: 47.7 % (ref 39.0–52.0)
Hemoglobin: 14.9 g/dL (ref 13.0–17.0)
Immature Granulocytes: 1 %
Lymphocytes Relative: 3 %
Lymphs Abs: 0.5 10*3/uL — ABNORMAL LOW (ref 0.7–4.0)
MCH: 33.6 pg (ref 26.0–34.0)
MCHC: 31.2 g/dL (ref 30.0–36.0)
MCV: 107.4 fL — ABNORMAL HIGH (ref 80.0–100.0)
Monocytes Absolute: 0.5 10*3/uL (ref 0.1–1.0)
Monocytes Relative: 3 %
Neutro Abs: 14.6 10*3/uL — ABNORMAL HIGH (ref 1.7–7.7)
Neutrophils Relative %: 93 %
Platelets: 368 10*3/uL (ref 150–400)
RBC: 4.44 MIL/uL (ref 4.22–5.81)
RDW: 11.8 % (ref 11.5–15.5)
WBC: 15.7 10*3/uL — ABNORMAL HIGH (ref 4.0–10.5)
nRBC: 0 % (ref 0.0–0.2)

## 2022-02-21 LAB — CBG MONITORING, ED
Glucose-Capillary: >600 mg/dL (ref 70–99)
Glucose-Capillary: >600 mg/dL (ref 70–99)
Glucose-Capillary: >600 mg/dL (ref 70–99)

## 2022-02-21 LAB — BLOOD GAS, VENOUS
Acid-base deficit: 19.2 mmol/L — ABNORMAL HIGH (ref 0.0–2.0)
Bicarbonate: 9.8 mmol/L — ABNORMAL LOW (ref 20.0–28.0)
O2 Saturation: 64.4 %
Patient temperature: 37
pCO2, Ven: 33 mmHg — ABNORMAL LOW (ref 44–60)
pH, Ven: 7.08 — CL (ref 7.25–7.43)
pO2, Ven: 42 mmHg (ref 32–45)

## 2022-02-21 LAB — LACTIC ACID, PLASMA
Lactic Acid, Venous: 3 mmol/L (ref 0.5–1.9)
Lactic Acid, Venous: 1.8 mmol/L (ref 0.5–1.9)

## 2022-02-21 LAB — BETA-HYDROXYBUTYRIC ACID
Beta-Hydroxybutyric Acid: >8 mmol/L — ABNORMAL HIGH (ref 0.05–0.27)
Beta-Hydroxybutyric Acid: >8 mmol/L — ABNORMAL HIGH (ref 0.05–0.27)

## 2022-02-21 LAB — LIPASE, BLOOD: Lipase: 32 U/L (ref 11–51)

## 2022-02-21 LAB — AMMONIA: Ammonia: 14 umol/L (ref 9–35)

## 2022-02-21 MED ORDER — PANTOPRAZOLE SODIUM 40 MG PO TBEC: 40.0000 mg | DELAYED_RELEASE_TABLET | Freq: Every day | ORAL | Status: DC

## 2022-02-21 MED ORDER — SODIUM CHLORIDE 0.9 % IV BOLUS
1000.0000 mL | Freq: Once | INTRAVENOUS | Status: AC
Start: 2022-02-21 — End: 2022-02-21
  Administered 2022-02-21: 1000 mL via INTRAVENOUS

## 2022-02-21 MED ORDER — LIP MEDEX EX OINT: TOPICAL_OINTMENT | CUTANEOUS | Status: DC | PRN

## 2022-02-21 MED ORDER — HEPARIN SODIUM (PORCINE) 5000 UNIT/ML IJ SOLN
5000.0000 [IU] | Freq: Three times a day (TID) | INTRAMUSCULAR | Status: DC
Start: 2022-02-21 — End: 2022-02-24
  Administered 2022-02-21 – 2022-02-23 (×7): 5000 [IU] via SUBCUTANEOUS
  Filled 2022-02-21 (×8): qty 1

## 2022-02-21 MED ORDER — DEXTROSE IN LACTATED RINGERS 5 % IV SOLN: INTRAVENOUS | Status: DC

## 2022-02-21 MED ORDER — LACTATED RINGERS IV SOLN: INTRAVENOUS | Status: DC

## 2022-02-21 MED ORDER — CHLORHEXIDINE GLUCONATE CLOTH 2 % EX PADS
6.0000 | MEDICATED_PAD | Freq: Every day | CUTANEOUS | Status: DC
  Administered 2022-02-21 – 2022-02-23 (×3): 6 via TOPICAL

## 2022-02-21 MED ORDER — CALCIUM GLUCONATE 10 % IV SOLN
1.0000 g | Freq: Once | INTRAVENOUS | Status: AC
  Administered 2022-02-21: 1 g via INTRAVENOUS
  Filled 2022-02-21: qty 10

## 2022-02-21 MED ORDER — DEXTROSE 50 % IV SOLN: 0.0000 mL | INTRAVENOUS | Status: DC | PRN

## 2022-02-21 MED ORDER — DOCUSATE SODIUM 100 MG PO CAPS: 100.0000 mg | ORAL_CAPSULE | Freq: Two times a day (BID) | ORAL | Status: DC | PRN

## 2022-02-21 MED ORDER — ALBUTEROL SULFATE (2.5 MG/3ML) 0.083% IN NEBU
10.0000 mg | INHALATION_SOLUTION | Freq: Once | RESPIRATORY_TRACT | Status: AC
Start: 2022-02-21 — End: 2022-02-21
  Administered 2022-02-21: 10 mg via RESPIRATORY_TRACT
  Filled 2022-02-21: qty 12

## 2022-02-21 MED ORDER — INSULIN REGULAR(HUMAN) IN NACL 100-0.9 UT/100ML-% IV SOLN
INTRAVENOUS | Status: DC
Start: 2022-02-21 — End: 2022-02-23
  Administered 2022-02-21: 12 [IU]/h via INTRAVENOUS
  Administered 2022-02-21: 10 [IU]/h via INTRAVENOUS
  Filled 2022-02-21 (×3): qty 100

## 2022-02-21 MED ORDER — LACTATED RINGERS IV BOLUS: 20.0000 mL/kg | Freq: Once | INTRAVENOUS | Status: DC

## 2022-02-21 MED ORDER — PANTOPRAZOLE SODIUM 40 MG IV SOLR
40.0000 mg | INTRAVENOUS | Status: DC
  Administered 2022-02-22 – 2022-02-23 (×3): 40 mg via INTRAVENOUS
  Filled 2022-02-21 (×3): qty 10

## 2022-02-21 MED ORDER — ONDANSETRON HCL 4 MG/2ML IJ SOLN: 4.0000 mg | Freq: Four times a day (QID) | INTRAMUSCULAR | Status: DC | PRN

## 2022-02-21 MED ORDER — POLYETHYLENE GLYCOL 3350 17 G PO PACK: 17.0000 g | PACK | Freq: Every day | ORAL | Status: DC | PRN

## 2022-02-21 MED ORDER — SODIUM CHLORIDE 0.9 % IV BOLUS
1000.0000 mL | Freq: Once | INTRAVENOUS | Status: AC
  Administered 2022-02-21: 1000 mL via INTRAVENOUS

## 2022-02-21 MED ORDER — ATORVASTATIN CALCIUM 40 MG PO TABS
40.0000 mg | ORAL_TABLET | Freq: Every day | ORAL | Status: DC
  Administered 2022-02-22 – 2022-02-24 (×3): 40 mg via ORAL
  Filled 2022-02-21 (×3): qty 1

## 2022-02-21 NOTE — H&P (Signed)
NAME:  Stanley Edwards, MRN:  [id number], DOB:  [birth year], LOS: 0 ADMISSION DATE:  02/21/2022,  REFERRING MD:  Dr Sherry Ruffing,  CHIEF COMPLAINT: Encephalopathy  History of Present Illness:  56 year old man with a history of poorly controlled insulin-dependent type 2 diabetes, hypertension, chronic renal insufficiency, hyperlipidemia.  He was apparently found poorly responsive and on the floor, potential fall although not witnessed.  Last seen by family 3 days prior.  Has had had previous admissions for encephalopathy, DKA. Hemodynamically stable although light blood pressure in the ED.  Evaluation includes a reassuring CT scan of the head, normal chest x-ray. Severe hyperglycemia, anion gap metabolic acidosis (pH 0000000) and acute renal insufficiency with hyperkalemia, K6.5.  EKG with peaked T waves.  Lactic acid 3.0  Pertinent  Medical History   Past Medical History:  Diagnosis Date   Allergy    Diabetes mellitus without complication ([place])    Hypertension    Pt denies htn but it was on his previous hx    Significant Hospital Events: Including procedures, antibiotic start and stop dates in addition to other pertinent events   Head CT 7/28 no acute findings  Interim History / Subjective:    Objective   Blood pressure 123/62, pulse (!) 119, temperature 98.2 F (36.8 C), temperature source Oral, resp. rate (!) 21, SpO2 100 %.        Intake/Output Summary (Last 24 hours) at 02/21/2022 1408 Last data filed at 02/21/2022 1403 Gross per 24 hour  Intake 1000 ml  Output --  Net 1000 ml   There were no vitals filed for this visit.  Examination: General: Well-developed ill-appearing man laying on stretcher in no distress HENT: Oropharynx dry, pupils equal and react  Lungs: Clear bilaterally Cardiovascular: Tachycardic, sinus tachycardia 125, no murmur Abdomen: Soft, nondistended with positive bowel sounds Extremities: No edema Neuro: Wakes to voice, tries to answer questions but  perseverates.  Follows commands, moves extremities GU: Deferred  Resolved Hospital Problem list     Assessment & Plan:  DKA in patient with type 2 diabetes, insulin-dependent.  Patient has some medication noncompliance.  No evidence for inciting event such as infection -Aggressive IV fluid resuscitation -Initiate insulin infusion per protocol.  Continue until anion gap closes, CO2 20 -Will need assistance with diabetes management once stabilized -Hold off on empiric antibiotics for now  Acute metabolic encephalopathy -Work to correct underlying metabolic disarray -Avoid sedating medications, narcotics, etc.  Acute renal failure due to effective hypovolemia -Aggressive volume resuscitation -Follow BMP, urine output -Consider nephrology consultation if fails to rebound with supportive care -Avoid nephrotoxins, renal dose medications  Hyperkalemia -Calcium supplementation now -Urgent treatment of his underlying DKA -Hold off on Lokelma, Kayexalate at this time, anticipate that his potassium will normalize with correction of his acidosis  Hypertension.  Currently hypotensive. -Plan to hold his home antihypertensive medications pending hemodynamic stabilization and reversal of his DKA, hypovolemia  Hyperlipidemia -On home atorvastatin  Best Practice (right click and "Reselect all SmartList Selections" daily)   Diet/type: NPO DVT prophylaxis: prophylactic heparin  GI prophylaxis: PPI Lines: N/A Foley:  N/A Code Status:  full code Last date of multidisciplinary goals of care discussion [pending]  Labs   CBC: Recent Labs  Lab 02/21/22 1132  WBC 15.7*  NEUTROABS 14.6*  HGB 14.9  HCT 47.7  MCV 107.4*  PLT 123XX123    Basic Metabolic Panel: Recent Labs  Lab 02/21/22 1132  NA 143  K 6.5*  CL 102  CO2 8*  GLUCOSE 867*  BUN 80*  CREATININE 4.17*  CALCIUM 9.7   GFR: CrCl cannot be calculated (Unknown ideal weight.). Recent Labs  Lab 02/21/22 1132  WBC 15.7*   LATICACIDVEN 3.0*    Liver Function Tests: Recent Labs  Lab 02/21/22 1132  AST 10*  ALT 14  ALKPHOS 96  BILITOT 2.7*  PROT 9.1*  ALBUMIN 4.5   Recent Labs  Lab 02/21/22 1132  LIPASE 32   Recent Labs  Lab 02/21/22 1133  AMMONIA 14    ABG    Component Value Date/Time   PHART 7.069 (LL) 09/15/2015 2240   PCO2ART 13.1 (LL) 09/15/2015 2240   PO2ART 130 (H) 09/15/2015 2240   HCO3 9.8 (L) 02/21/2022 1132   TCO2 <5 (L) 11/02/2021 1501   ACIDBASEDEF 19.2 (H) 02/21/2022 1132   O2SAT 64.4 02/21/2022 1132     Coagulation Profile: No results for input(s): "INR", "PROTIME" in the last 168 hours.  Cardiac Enzymes: No results for input(s): "CKTOTAL", "CKMB", "CKMBINDEX", "TROPONINI" in the last 168 hours.  HbA1C: Hemoglobin A1C  Date/Time Value Ref Range Status  07/17/2021 11:23 AM 11.9 (A) 4.0 - 5.6 % Final   HbA1c, POC (controlled diabetic range)  Date/Time Value Ref Range Status  02/11/2021 03:34 PM 6.2 0.0 - 7.0 % Final  05/30/2020 04:05 PM 7.1 (A) 0.0 - 7.0 % Final   Hgb A1c MFr Bld  Date/Time Value Ref Range Status  11/02/2021 04:15 PM 11.8 (H) 4.8 - 5.6 % Final    Comment:    (NOTE) Pre diabetes:          5.7%-6.4%  Diabetes:              >6.4%  Glycemic control for   <7.0% adults with diabetes     CBG: Recent Labs  Lab 02/21/22 1148 02/21/22 1355  GLUCAP >600* >600*    Review of Systems:   As per HPi  Past Medical History:  He,  has a past medical history of Allergy, Diabetes mellitus without complication (HCC), and Hypertension.   Surgical History:  No past surgical history on file.   Social History:   reports that he has quit smoking. His smoking use included cigarettes. He has never used smokeless tobacco. He reports current alcohol use of about 1.0 standard drink of alcohol per week. He reports that he does not use drugs.   Family History:  His family history includes Cancer in his mother; Diabetes in his father.   Allergies No  Known Allergies   Home Medications  Prior to Admission medications   Medication Sig Start Date End Date Taking? Authorizing Provider  atorvastatin (LIPITOR) 40 MG tablet Take 1 tablet (40 mg total) by mouth daily. 11/26/21   Hoy Register, MD  Blood Glucose Monitoring Suppl (TRUE METRIX METER) DEVI 1 each by Does not apply route 3 (three) times daily before meals. 05/30/20   Hoy Register, MD  glucose blood (TRUE METRIX BLOOD GLUCOSE TEST) test strip Use 3 times daily before meals 05/30/20   Hoy Register, MD  Insulin Glargine (BASAGLAR KWIKPEN) 100 UNIT/ML Inject 30 Units into the skin once daily. 01/07/22   Hoy Register, MD  insulin lispro (HUMALOG) 100 UNIT/ML KwikPen Inject 0-12 Units into the skin 3 (three) times daily with meals. Per Sliding scale 12/09/21   Hoy Register, MD  Insulin Pen Needle 32G X 4 MM MISC Use to inject insulin 4 times daily. 11/07/21   Drema Dallas, MD     Critical care time:  40 min     Levy Pupa, MD, PhD 02/21/2022, 2:20 PM [facility] [phone] or if no answer before 7:00PM call [phone] For any issues after 7:00PM please call eLink [phone]

## 2022-02-21 NOTE — ED Triage Notes (Signed)
Pt. BIB [facility] from home. Pt. Is altered. EMS reported Pt. forgets to take his insulin and has a Hx of DKA. Pt. Sugars are reading high. EMS also reported pt was found down and had a potential fall.

## 2022-02-21 NOTE — ED Notes (Signed)
Pt in CT.

## 2022-02-21 NOTE — ED Provider Notes (Signed)
[facility]-EMERGENCY DEPT Provider Note   CSN: [id number] Arrival date & time: 02/21/22  1113     History  No chief complaint on file.   Stanley Edwards is a 56 y.o. male.  The history is provided by the EMS personnel, medical records and the patient. The history is limited by the condition of the patient. No language interpreter was used.  Hyperglycemia Blood sugar level PTA:  >600 Severity:  Severe Onset quality:  Gradual Duration:  3 days Timing:  Constant Progression:  Unchanged Diabetes status:  Unable to specify Relieved by:  Nothing Ineffective treatments:  None tried Associated symptoms: confusion and fatigue   Associated symptoms: no abdominal pain, no chest pain, no dizziness, no fever, no nausea, no shortness of breath and no vomiting   Risk factors: hx of DKA        Home Medications Prior to Admission medications   Medication Sig Start Date End Date Taking? Authorizing Provider  atorvastatin (LIPITOR) 40 MG tablet Take 1 tablet (40 mg total) by mouth daily. 11/26/21   Hoy Register, MD  Blood Glucose Monitoring Suppl (TRUE METRIX METER) DEVI 1 each by Does not apply route 3 (three) times daily before meals. 05/30/20   Hoy Register, MD  glucose blood (TRUE METRIX BLOOD GLUCOSE TEST) test strip Use 3 times daily before meals 05/30/20   Hoy Register, MD  Insulin Glargine (BASAGLAR KWIKPEN) 100 UNIT/ML Inject 30 Units into the skin once daily. 01/07/22   Hoy Register, MD  insulin lispro (HUMALOG) 100 UNIT/ML KwikPen Inject 0-12 Units into the skin 3 (three) times daily with meals. Per Sliding scale 12/09/21   Hoy Register, MD  Insulin Pen Needle 32G X 4 MM MISC Use to inject insulin 4 times daily. 11/07/21   Drema Dallas, MD      Allergies    Patient has no known allergies.    Review of Systems   Review of Systems  Constitutional:  Positive for fatigue. Negative for chills and fever.  HENT:  Negative for congestion.   Eyes:   Negative for visual disturbance.  Respiratory:  Negative for cough, chest tightness, shortness of breath and wheezing.   Cardiovascular:  Negative for chest pain and palpitations.  Gastrointestinal:  Negative for abdominal pain, constipation, diarrhea, nausea and vomiting.  Genitourinary:  Negative for flank pain and frequency.  Musculoskeletal:  Negative for back pain, neck pain and neck stiffness.  Skin:  Negative for rash and wound.  Neurological:  Negative for dizziness and headaches.  Psychiatric/Behavioral:  Positive for confusion. Negative for agitation.   All other systems reviewed and are negative.   Physical Exam Updated Vital Signs There were no vitals taken for this visit. Physical Exam Vitals and nursing note reviewed.  Constitutional:      General: He is in acute distress.     Appearance: He is well-developed. He is obese. He is ill-appearing. He is not toxic-appearing or diaphoretic.  HENT:     Head: Normocephalic and atraumatic.     Nose: No congestion or rhinorrhea.     Mouth/Throat:     Mouth: Mucous membranes are moist.     Pharynx: No oropharyngeal exudate or posterior oropharyngeal erythema.  Eyes:     Conjunctiva/sclera: Conjunctivae normal.     Pupils: Pupils are equal, round, and reactive to light.  Cardiovascular:     Rate and Rhythm: Regular rhythm. Tachycardia present.     Heart sounds: No murmur heard. Pulmonary:  Breath sounds: Normal breath sounds. No wheezing, rhonchi or rales.  Chest:     Chest wall: No tenderness.  Abdominal:     General: Abdomen is flat.     Palpations: Abdomen is soft.     Tenderness: There is no abdominal tenderness. There is no right CVA tenderness, left CVA tenderness, guarding or rebound.  Musculoskeletal:        General: No swelling or tenderness.     Cervical back: Neck supple. No tenderness.  Skin:    Capillary Refill: Capillary refill takes less than 2 seconds.  Neurological:     Mental Status: He is  disoriented.     ED Results / Procedures / Treatments   Labs (all labs ordered are listed, but only abnormal results are displayed) Labs Reviewed  CBC WITH DIFFERENTIAL/PLATELET - Abnormal; Notable for the following components:      Result Value   WBC 15.7 (*)    MCV 107.4 (*)    Neutro Abs 14.6 (*)    Lymphs Abs 0.5 (*)    Abs Immature Granulocytes 0.08 (*)    All other components within normal limits  COMPREHENSIVE METABOLIC PANEL - Abnormal; Notable for the following components:   Potassium 6.5 (*)    CO2 8 (*)    Glucose, Bld 867 (*)    BUN 80 (*)    Creatinine, Ser 4.17 (*)    Total Protein 9.1 (*)    AST 10 (*)    Total Bilirubin 2.7 (*)    GFR, Estimated 16 (*)    Anion gap 33 (*)    All other components within normal limits  LACTIC ACID, PLASMA - Abnormal; Notable for the following components:   Lactic Acid, Venous 3.0 (*)    All other components within normal limits  BETA-HYDROXYBUTYRIC ACID - Abnormal; Notable for the following components:   Beta-Hydroxybutyric Acid >8.00 (*)    All other components within normal limits  BLOOD GAS, VENOUS - Abnormal; Notable for the following components:   pH, Ven 7.08 (*)    pCO2, Ven 33 (*)    Bicarbonate 9.8 (*)    Acid-base deficit 19.2 (*)    All other components within normal limits  CBG MONITORING, ED - Abnormal; Notable for the following components:   Glucose-Capillary >600 (*)    All other components within normal limits  CBG MONITORING, ED - Abnormal; Notable for the following components:   Glucose-Capillary >600 (*)    All other components within normal limits  CBG MONITORING, ED - Abnormal; Notable for the following components:   Glucose-Capillary >600 (*)    All other components within normal limits  URINE CULTURE  CULTURE, BLOOD (ROUTINE X 2)  CULTURE, BLOOD (ROUTINE X 2)  LIPASE, BLOOD  AMMONIA  LACTIC ACID, PLASMA  URINALYSIS, ROUTINE W REFLEX MICROSCOPIC  BASIC METABOLIC PANEL  BASIC METABOLIC PANEL   BASIC METABOLIC PANEL  BASIC METABOLIC PANEL    EKG EKG Interpretation  Date/Time:  Friday February 21 2022 12:18:43 EDT Ventricular Rate:  128 PR Interval:  120 QRS Duration: 83 QT Interval:  331 QTC Calculation: 483 R Axis:   88 Text Interpretation: Sinus tachycardia Borderline repolarization abnormality Borderline prolonged QT interval when compared to prior, sharper t waves. No sTEMI Confirmed by Theda Belfast ([id number]) on 02/21/2022 12:31:49 PM  Radiology DG Chest Portable 1 View  Result Date: 02/21/2022 CLINICAL DATA:  Hypoxia EXAM: PORTABLE CHEST 1 VIEW COMPARISON:  11/02/2021 FINDINGS: Previously seen central line has been removed. Heart  size is normal. No focal airspace consolidation, pleural effusion, or pneumothorax. IMPRESSION: No active disease. Electronically Signed   By: Duanne Guess D.O.   On: 02/21/2022 12:26   CT HEAD WO CONTRAST ( )  Result Date: 02/21/2022 CLINICAL DATA:  Altered mental status, head trauma EXAM: CT HEAD WITHOUT CONTRAST TECHNIQUE: Contiguous axial images were obtained from the base of the skull through the vertex without intravenous contrast. RADIATION DOSE REDUCTION: This exam was performed according to the departmental dose-optimization program which includes automated exposure control, adjustment of the mA and/or kV according to patient size and/or use of iterative reconstruction technique. COMPARISON:  CT 11/02/2021 FINDINGS: Brain: No intracranial hemorrhage, mass effect, or evidence of acute infarct. No hydrocephalus. No extra-axial fluid collection. Vascular: No hyperdense vessel or unexpected calcification. Skull: No fracture or focal lesion. Sinuses/Orbits: No acute finding. Paranasal sinuses and mastoid air cells are well aerated. Other: None. IMPRESSION: No acute intracranial abnormality. Electronically Signed   By: Minerva Fester M.D.   On: 02/21/2022 12:21    Procedures Procedures    Medications Ordered in ED Medications  lactated  ringers bolus 20 mL/kg (has no administration in time range)  insulin regular, human (MYXREDLIN) 100 units/ 100 mL infusion (10 Units/hr Intravenous Rate/Dose Verify 02/21/22 1509)  lactated ringers infusion ( Intravenous New Bag/Given 02/21/22 1424)  dextrose 5 % in lactated ringers infusion (has no administration in time range)  dextrose 50 % solution 0-50 mL (has no administration in time range)  sodium chloride 0.9 % bolus 1,000 mL (0 mLs Intravenous Stopped 02/21/22 1403)  calcium gluconate inj 10% (1 g) URGENT USE ONLY! (1 g Intravenous Given 02/21/22 1349)  albuterol (PROVENTIL) (2.5 MG/3ML) 0.083% nebulizer solution 10 mg (10 mg Nebulization Given 02/21/22 1346)  sodium chloride 0.9 % bolus 1,000 mL (0 mLs Intravenous Stopped 02/21/22 1507)    ED Course/ Medical Decision Making/ A&P                           Medical Decision Making Amount and/or Complexity of Data Reviewed Labs: ordered. Radiology: ordered.  Risk Prescription drug management. Decision regarding hospitalization.    Stanley Edwards is a 56 y.o. male with a past medical history significant for diabetes with recurrent episodes of DKA, hypertension, and previous metabolic encephalopathy who presents with altered mental status, fall, and hyperglycemia.  According to EMS, patient was last seen normal 3 days ago on Wednesday by family.  Patient today was found on the ground and the bathroom and concern for fall.  Patient was found to be altered and have a glucose of over 600 with EMS.  Family reported to nursing that patient's presentation is similar to when he had DKA in the past.  There is no evidence of acute trauma but laying on the ground with altered mental status they were concerned about ruling out head injury.  When I yelled loudly at the patient, he was able to wake up and answer some questions.  He denies any headache, neck pain, chest pain or abdominal pain.  Denies nausea, vomiting, or other GI symptoms.  He nods  his head yes that this feels like when he had DKA in the past.  Patient's blood pressure was in the 80s with EMS and they gave 500 of normal saline that improved into around 100 on arrival.  On my exam, lungs were clear and chest is nontender.  Abdomen nontender.  He was able to move all extremities  with stimulation to wake him up.  He is very somnolent.  Pupils are symmetric and reactive normal extract movements.  No laceration seen.  We will get CT head to rule out head injury however I primarily concerned about recurrent DKA.  We will give him some fluids and wait for his labs to return.  We will get chest x-ray and urinalysis to look for infectious etiology prompting it.  Anticipate admission for altered mental status with suspected DKA when work-up is completed.  1:32 PM As expected, patient's labs returned concerning.  I called lab to try to expedite completion and resulting of labs and it now does confirm he is in DKA.  Patient's pH is 7.0, his potassium is 6.5 with no hemolysis, his glucose is 867 and his anion gap is 33, his creatinine is 1.4 up from 1.3 several months ago.  Patient does have a leukocytosis and his lactic acid is 3 although I do not have a good source of infection at this time.  His urinalysis has not yet been collected as he has not made any urine yet.  His chest x-ray however did not show pneumonia and his CT head did not show evidence of acute injury.  Patient remains somnolent but is still answering questions.  We will start insulin drip and given his sharp T waves in the potassium extremely high, will also try to temporize with some calcium gluconate and some albuterol.  Patient is on oxygen for some mild hypoxia he developed.  We will call nephrology due to the severe AKI and will also call critical care given the patient's ill nature  1:40 PM Just spoke to critical care who agrees with plan.  They will come see patient for admission and agree with ICU level care.   They agreed with giving the calcium gluconate and fluids and insulin drip to help with the hyperkalemia.  They would recommend holding on Lokelma and other treatments of hyperkalemia.  I spoke Dr. Vida Roller with nephrology who agrees.  The agree with critical care admission and if there is other problems or complications, they recommend having critical care call them for further management.  Critical care will see for admission.         Final Clinical Impression(s) / ED Diagnoses Final diagnoses:  Somnolence  Diabetic ketoacidosis without coma associated with type 2 diabetes mellitus (HCC)  Hyperkalemia     Clinical Impression: 1. Somnolence   2. Diabetic ketoacidosis without coma associated with type 2 diabetes mellitus (HCC)   3. Hyperkalemia     Disposition: Admit  This note was prepared with assistance of Dragon voice recognition software. Occasional wrong-word or sound-a-like substitutions may have occurred due to the inherent limitations of voice recognition software.      Yamilee Harmes, Canary Brim, MD 02/21/22 1524

## 2022-02-22 LAB — URINALYSIS, ROUTINE W REFLEX MICROSCOPIC
Bilirubin Urine: NEGATIVE
Glucose, UA: >=500 mg/dL — AB
Hgb urine dipstick: NEGATIVE
Ketones, ur: 20 mg/dL — AB
Leukocytes,Ua: NEGATIVE
Nitrite: NEGATIVE
Protein, ur: NEGATIVE mg/dL
Specific Gravity, Urine: 1.024 (ref 1.005–1.030)
pH: 5 (ref 5.0–8.0)

## 2022-02-22 LAB — MAGNESIUM: Magnesium: 2.9 mg/dL — ABNORMAL HIGH (ref 1.7–2.4)

## 2022-02-22 LAB — PHOSPHORUS: Phosphorus: 1.4 mg/dL — ABNORMAL LOW (ref 2.5–4.6)

## 2022-02-22 LAB — BASIC METABOLIC PANEL
Anion gap: 10 (ref 5–15)
Anion gap: 7 (ref 5–15)
Anion gap: 10 (ref 5–15)
BUN: 65 mg/dL — ABNORMAL HIGH (ref 6–20)
BUN: 64 mg/dL — ABNORMAL HIGH (ref 6–20)
BUN: 51 mg/dL — ABNORMAL HIGH (ref 6–20)
CO2: 26 mmol/L (ref 22–32)
CO2: 26 mmol/L (ref 22–32)
CO2: 25 mmol/L (ref 22–32)
Calcium: 9.7 mg/dL (ref 8.9–10.3)
Calcium: 10 mg/dL (ref 8.9–10.3)
Calcium: 10.3 mg/dL (ref 8.9–10.3)
Chloride: 122 mmol/L — ABNORMAL HIGH (ref 98–111)
Chloride: 129 mmol/L — ABNORMAL HIGH (ref 98–111)
Chloride: 125 mmol/L — ABNORMAL HIGH (ref 98–111)
Creatinine, Ser: 2.28 mg/dL — ABNORMAL HIGH (ref 0.61–1.24)
Creatinine, Ser: 2.03 mg/dL — ABNORMAL HIGH (ref 0.61–1.24)
Creatinine, Ser: 1.72 mg/dL — ABNORMAL HIGH (ref 0.61–1.24)
GFR, Estimated: 33 mL/min — ABNORMAL LOW (ref >60)
GFR, Estimated: 38 mL/min — ABNORMAL LOW (ref >60)
GFR, Estimated: 46 mL/min — ABNORMAL LOW (ref >60)
Glucose, Bld: 205 mg/dL — ABNORMAL HIGH (ref 70–99)
Glucose, Bld: 169 mg/dL — ABNORMAL HIGH (ref 70–99)
Glucose, Bld: 198 mg/dL — ABNORMAL HIGH (ref 70–99)
Potassium: 3.6 mmol/L (ref 3.5–5.1)
Potassium: 3.5 mmol/L (ref 3.5–5.1)
Potassium: 4 mmol/L (ref 3.5–5.1)
Sodium: 158 mmol/L — ABNORMAL HIGH (ref 135–145)
Sodium: 162 mmol/L (ref 135–145)
Sodium: 160 mmol/L — ABNORMAL HIGH (ref 135–145)

## 2022-02-22 LAB — CBC
HCT: 39.1 % (ref 39.0–52.0)
Hemoglobin: 13.2 g/dL (ref 13.0–17.0)
MCH: 33.9 pg (ref 26.0–34.0)
MCHC: 33.8 g/dL (ref 30.0–36.0)
MCV: 100.5 fL — ABNORMAL HIGH (ref 80.0–100.0)
Platelets: 282 10*3/uL (ref 150–400)
RBC: 3.89 MIL/uL — ABNORMAL LOW (ref 4.22–5.81)
RDW: 11.9 % (ref 11.5–15.5)
WBC: 11.8 10*3/uL — ABNORMAL HIGH (ref 4.0–10.5)
nRBC: 0 % (ref 0.0–0.2)

## 2022-02-22 LAB — GLUCOSE, CAPILLARY
Glucose-Capillary: 132 mg/dL — ABNORMAL HIGH (ref 70–99)
Glucose-Capillary: 133 mg/dL — ABNORMAL HIGH (ref 70–99)
Glucose-Capillary: 155 mg/dL — ABNORMAL HIGH (ref 70–99)
Glucose-Capillary: 157 mg/dL — ABNORMAL HIGH (ref 70–99)
Glucose-Capillary: 176 mg/dL — ABNORMAL HIGH (ref 70–99)
Glucose-Capillary: 178 mg/dL — ABNORMAL HIGH (ref 70–99)
Glucose-Capillary: 198 mg/dL — ABNORMAL HIGH (ref 70–99)
Glucose-Capillary: 213 mg/dL — ABNORMAL HIGH (ref 70–99)
Glucose-Capillary: 220 mg/dL — ABNORMAL HIGH (ref 70–99)
Glucose-Capillary: 256 mg/dL — ABNORMAL HIGH (ref 70–99)

## 2022-02-22 LAB — BETA-HYDROXYBUTYRIC ACID: Beta-Hydroxybutyric Acid: 0.98 mmol/L — ABNORMAL HIGH (ref 0.05–0.27)

## 2022-02-22 MED ORDER — INSULIN GLARGINE-YFGN 100 UNIT/ML ~~LOC~~ SOLN
15.0000 [IU] | Freq: Once | SUBCUTANEOUS | Status: AC
  Administered 2022-02-22: 15 [IU] via SUBCUTANEOUS
  Filled 2022-02-22: qty 0.15

## 2022-02-22 MED ORDER — POTASSIUM PHOSPHATES 15 MMOLE/5ML IV SOLN
15.0000 mmol | Freq: Once | INTRAVENOUS | Status: AC
  Administered 2022-02-22: 15 mmol via INTRAVENOUS
  Filled 2022-02-22: qty 5

## 2022-02-22 MED ORDER — FREE WATER: 200.0000 mL | Status: DC

## 2022-02-22 MED ORDER — SODIUM CHLORIDE 0.45 % IV SOLN: INTRAVENOUS | Status: AC

## 2022-02-22 MED ORDER — INSULIN ASPART 100 UNIT/ML IJ SOLN
0.0000 [IU] | INTRAMUSCULAR | Status: DC
  Administered 2022-02-22: 2 [IU] via SUBCUTANEOUS
  Administered 2022-02-22: 8 [IU] via SUBCUTANEOUS
  Administered 2022-02-22: 5 [IU] via SUBCUTANEOUS
  Administered 2022-02-22 – 2022-02-23 (×2): 2 [IU] via SUBCUTANEOUS
  Administered 2022-02-23: 11 [IU] via SUBCUTANEOUS
  Administered 2022-02-23: 5 [IU] via SUBCUTANEOUS
  Administered 2022-02-23: 8 [IU] via SUBCUTANEOUS
  Administered 2022-02-23: 5 [IU] via SUBCUTANEOUS
  Administered 2022-02-24: 11 [IU] via SUBCUTANEOUS
  Administered 2022-02-24: 5 [IU] via SUBCUTANEOUS
  Administered 2022-02-24: 2 [IU] via SUBCUTANEOUS

## 2022-02-22 NOTE — Progress Notes (Signed)
Called e-link (June) and notified of critical lab value Na 162. Padded pt bedrails and will await further instructions.

## 2022-02-22 NOTE — Progress Notes (Signed)
NAME:  Stanley Edwards, MRN:  [id number], DOB:  18-Jan-1966, LOS: 1 ADMISSION DATE:  02/21/2022,  REFERRING MD:  Dr Rush Landmark,  CHIEF COMPLAINT: Encephalopathy  History of Present Illness:  56 year old man with a history of poorly controlled insulin-dependent type 2 diabetes, hypertension, chronic renal insufficiency, hyperlipidemia.  He was apparently found poorly responsive and on the floor, potential fall although not witnessed.  Last seen by family 3 days prior.  Has had had previous admissions for encephalopathy, DKA. Hemodynamically stable although light blood pressure in the ED.  Evaluation includes a reassuring CT scan of the head, normal chest x-ray. Severe hyperglycemia, anion gap metabolic acidosis (pH 7.08) and acute renal insufficiency with hyperkalemia, K6.5.  EKG with peaked T waves.  Lactic acid 3.0  Pertinent  Medical History   Past Medical History:  Diagnosis Date   Allergy    Diabetes mellitus without complication (HCC)    Hypertension    Pt denies htn but it was on his previous hx    Significant Hospital Events: Including procedures, antibiotic start and stop dates in addition to other pertinent events   Head CT 7/28 no acute findings  Interim History / Subjective:   Much more awake today.  Denies any pain or shortness of breath  Objective   Blood pressure 132/65, pulse (!) 107, temperature 98.8 F (37.1 C), temperature source Axillary, resp. rate 15, weight 89 kg, SpO2 97 %.        Intake/Output Summary (Last 24 hours) at 02/22/2022 0807 Last data filed at 02/22/2022 0011 Gross per 24 hour  Intake 3324.05 ml  Output 550 ml  Net 2774.05 ml   Filed Weights   02/21/22 1500  Weight: 89 kg    Examination: Blood pressure 132/65, pulse (!) 107, temperature 98.8 F (37.1 C), temperature source Axillary, resp. rate 15, weight 89 kg, SpO2 97 %. Gen:      No acute distress HEENT:  EOMI, sclera anicteric Neck:     No masses; no thyromegaly Lungs:    Clear to  auscultation bilaterally; normal respiratory effort CV:         Regular rate and rhythm; no murmurs Abd:      + bowel sounds; soft, non-tender; no palpable masses, no distension Ext:    No edema; adequate peripheral perfusion Skin:      Warm and dry; no rash Neuro: alert and oriented x 3 Psych: normal mood and affect   Labs/imaging reviewed Significant for sodium 162, glucose 169, creatinine 2.03 WBC 11.8, platelets 282   Resolved Hospital Problem list     Assessment & Plan:  DKA in patient with type 2 diabetes, insulin-dependent.  Patient has some medication noncompliance.  No evidence for inciting event such as infection Transition off insulin drip to subcu Advance diet  Acute metabolic encephalopathy> improving Monitor Avoid sedating medication  Acute renal failure due to effective hypovolemia Hypernatremia Creatinine is better DC normal saline.  Start p.o. intake and encourage to drink plenty of water Repeat BMP later today.  If still elevated then he will need an NG tube placement and free water via tube  Hyperlipidemia On home atorvastatin  Stable for transfer to [facility] service  Best Practice (right click and "Reselect all SmartList Selections" daily)   Diet/type: NPO DVT prophylaxis: prophylactic heparin  GI prophylaxis: PPI Lines: N/A Foley:  N/A Code Status:  full code Last date of multidisciplinary goals of care discussion [pending]  Signature:   Chilton Greathouse MD [facility] See Amion for pager  If no response to pager , please call [phone] until 7pm After 7:00 pm call Elink  [phone] 02/22/2022, 8:07 AM

## 2022-02-22 NOTE — Progress Notes (Signed)
eLink Physician-Brief Progress Note Patient Name: Stanley Edwards DOB: [birth year] MRN: [id number]   Date of Service  02/22/2022  HPI/Events of Note  56 y/o M with DKA ,  type 2 diabetes, insulin-dependent. Has been on Insulin infusion . AM Na = 162   eICU Interventions  - DC normal saline  - insulin infusion per protocol.   -can stop after 1 hr  overlap with Lantus insulin  -Acute metabolic encephalopathy from above, now compounded by hypernatremia.  -NGT placement with free water flushes ordered in effort to avoid free water IV in the form of D5W         Loretha Ure N Thurl Boen 02/22/2022, 6:35 AM

## 2022-02-22 NOTE — Progress Notes (Signed)
Pharmacy Electrolyte Replacement  Recent Labs:  Recent Labs    02/22/22 0506  K 3.5  MG 2.9*  PHOS 1.4*  CREATININE 2.03*    Low Critical Values (K </= 2.5, Phos </= 1, Mg </= 1) Present: None  MD Contacted: Dr. Isaiah Serge  Plan:  - Potassium phosphate IV x1 - Cautiously repleting Phos given elevated Scr from baseline - Avoid sodium phos given elevated sodium  Rexford Maus, PharmD, BCPS 02/22/2022 12:04 PM

## 2022-02-23 DIAGNOSIS — G9341 Metabolic encephalopathy: Secondary | ICD-10-CM | POA: Diagnosis not present

## 2022-02-23 DIAGNOSIS — E785 Hyperlipidemia, unspecified: Secondary | ICD-10-CM | POA: Diagnosis not present

## 2022-02-23 DIAGNOSIS — I10 Essential (primary) hypertension: Secondary | ICD-10-CM

## 2022-02-23 DIAGNOSIS — E1110 Type 2 diabetes mellitus with ketoacidosis without coma: Secondary | ICD-10-CM | POA: Diagnosis not present

## 2022-02-23 DIAGNOSIS — N179 Acute kidney failure, unspecified: Secondary | ICD-10-CM | POA: Diagnosis not present

## 2022-02-23 LAB — GLUCOSE, CAPILLARY
Glucose-Capillary: 134 mg/dL — ABNORMAL HIGH (ref 70–99)
Glucose-Capillary: 138 mg/dL — ABNORMAL HIGH (ref 70–99)
Glucose-Capillary: 222 mg/dL — ABNORMAL HIGH (ref 70–99)
Glucose-Capillary: 245 mg/dL — ABNORMAL HIGH (ref 70–99)
Glucose-Capillary: 261 mg/dL — ABNORMAL HIGH (ref 70–99)
Glucose-Capillary: 324 mg/dL — ABNORMAL HIGH (ref 70–99)
Glucose-Capillary: 83 mg/dL (ref 70–99)

## 2022-02-23 LAB — BASIC METABOLIC PANEL
Anion gap: 7 (ref 5–15)
BUN: 38 mg/dL — ABNORMAL HIGH (ref 6–20)
CO2: 26 mmol/L (ref 22–32)
Calcium: 9.4 mg/dL (ref 8.9–10.3)
Chloride: 120 mmol/L — ABNORMAL HIGH (ref 98–111)
Creatinine, Ser: 1.35 mg/dL — ABNORMAL HIGH (ref 0.61–1.24)
GFR, Estimated: >60 mL/min (ref >60)
Glucose, Bld: 119 mg/dL — ABNORMAL HIGH (ref 70–99)
Potassium: 3.7 mmol/L (ref 3.5–5.1)
Sodium: 153 mmol/L — ABNORMAL HIGH (ref 135–145)

## 2022-02-23 LAB — CBC
HCT: 35.6 % — ABNORMAL LOW (ref 39.0–52.0)
Hemoglobin: 11.8 g/dL — ABNORMAL LOW (ref 13.0–17.0)
MCH: 34.3 pg — ABNORMAL HIGH (ref 26.0–34.0)
MCHC: 33.1 g/dL (ref 30.0–36.0)
MCV: 103.5 fL — ABNORMAL HIGH (ref 80.0–100.0)
Platelets: 249 10*3/uL (ref 150–400)
RBC: 3.44 MIL/uL — ABNORMAL LOW (ref 4.22–5.81)
RDW: 12.1 % (ref 11.5–15.5)
WBC: 12.1 10*3/uL — ABNORMAL HIGH (ref 4.0–10.5)
nRBC: 0 % (ref 0.0–0.2)

## 2022-02-23 LAB — URINE CULTURE: Culture: <10000 — AB

## 2022-02-23 MED ORDER — INSULIN GLARGINE-YFGN 100 UNIT/ML ~~LOC~~ SOLN
15.0000 [IU] | Freq: Every day | SUBCUTANEOUS | Status: DC
Start: 2022-02-23 — End: 2022-02-24
  Administered 2022-02-23 – 2022-02-24 (×2): 15 [IU] via SUBCUTANEOUS
  Filled 2022-02-23 (×2): qty 0.15

## 2022-02-23 MED ORDER — ORAL CARE MOUTH RINSE: 15.0000 mL | OROMUCOSAL | Status: DC | PRN

## 2022-02-23 MED ORDER — SODIUM CHLORIDE 0.45 % IV SOLN: INTRAVENOUS | Status: AC

## 2022-02-23 MED ORDER — FOLIC ACID 1 MG PO TABS
1.0000 mg | ORAL_TABLET | Freq: Every day | ORAL | Status: DC
  Administered 2022-02-23 – 2022-02-24 (×2): 1 mg via ORAL
  Filled 2022-02-23 (×2): qty 1

## 2022-02-23 MED ORDER — POTASSIUM CHLORIDE CRYS ER 20 MEQ PO TBCR
40.0000 meq | EXTENDED_RELEASE_TABLET | Freq: Once | ORAL | Status: AC
  Administered 2022-02-23: 40 meq via ORAL
  Filled 2022-02-23: qty 2

## 2022-02-23 NOTE — Progress Notes (Addendum)
PROGRESS NOTE    Stanley Edwards  JJK:[id number] DOB: 1966/02/27 DOA: 02/21/2022 PCP: Hoy Register, MD   Brief Narrative:  56 year old male with history of diabetes mellitus type 2, hypertension, hyperlipidemia was found on the floor poorly responsive with?  Potentially fall although not witnessed.  On presentation, he was found to have severe hyperglycemia along with anion gap metabolic acidosis with pH of 7.08, acute kidney injury, hyperkalemia with potassium of 6.5 and peaked T waves.  Chest x-ray was negative for infiltrates.  CT of the head was negative for acute intracranial abnormality.  He was admitted with DKA and acute metabolic encephalopathy and started on IV fluids and insulin drip.  Subsequently, he has been switched to subcutaneous long-acting insulin and mental status has improved.  He has been transferred to [facility] service from 02/23/2022 onwards.  Assessment & Plan:   DKA Possible noncompliance Diabetes mellitus type 2 uncontrolled -Presented with DKA and treated with IV fluids and insulin drip.  Subsequently, has been transitioned to long-acting insulin. -Hypoglycemic this morning.  Long-acting insulin was discontinued.  Continue CBGs with SSI.  Carb modified diet.  Consult diabetes coordinator.  Acute metabolic encephalopathy -Possibly from DKA.  CT of the head was negative for acute intracranial abnormality.  Mental status has much improved.  Still slow to respond.  Monitor.  Fall precautions.  Acute renal failure --Possibly from DKA and dehydration.  Treated with IV fluids.  Creatinine 1.35 today  Hypernatremia -Sodium level improving to 153 today.  Repeat a.m. labs  Leukocytosis -possibly reactive.  Monitor  Macrocytosis -Questionable cause.  Empirically start folic acid supplementation.  Check vitamin B12 and TSH in a.m.  Hyperlipidemia -Continue statin  Physical deconditioning -PT eval   DVT prophylaxis: Heparin subcutaneous Code Status: Full Family  Communication: None at bedside Disposition Plan: Status is: Inpatient Remains inpatient appropriate because: Of severity of illness    Consultants: PCCM  Procedures: None  Antimicrobials: None   Subjective: Patient seen and examined at bedside.  Awake, answers some questions, slow to respond.  Feels better.  Denies any current nausea, vomiting, fever.  Objective: Vitals:   02/23/22 0800 02/23/22 0848 02/23/22 0900 02/23/22 1000  BP: (!) 160/81  (!) 142/71 (!) 123/57  Pulse: 83  86 79  Resp: 16  18 18   Temp:  98.3 F (36.8 C)    TempSrc:  Oral    SpO2: 100%  99% 100%  Weight:        Intake/Output Summary (Last 24 hours) at 02/23/2022 1129 Last data filed at 02/23/2022 0700 Gross per 24 hour  Intake 2806.81 ml  Output 450 ml  Net 2356.81 ml   Filed Weights   02/21/22 1500  Weight: 89 kg    Examination:  General exam: Appears calm and comfortable.  Looks chronically ill and deconditioned.  Currently on room air. Respiratory system: Bilateral decreased breath sounds at bases Cardiovascular system: S1 & S2 heard, Rate controlled Gastrointestinal system: Abdomen is nondistended, soft and nontender. Normal bowel sounds heard. Extremities: No cyanosis, clubbing, edema  Central nervous system: Alert and awake.  Still slow to respond.  No focal neurological deficits. Moving extremities Skin: No rashes, lesions or ulcers Psychiatry: Flat affect.  No signs of agitation.  Data Reviewed: I have personally reviewed following labs and imaging studies  CBC: Recent Labs  Lab 02/21/22 1132 02/22/22 0506 02/23/22 0301  WBC 15.7* 11.8* 12.1*  NEUTROABS 14.6*  --   --   HGB 14.9 13.2 11.8*  HCT 47.7 39.1  35.6*  MCV 107.4* 100.5* 103.5*  PLT 368 282 0000000   Basic Metabolic Panel: Recent Labs  Lab 02/21/22 2138 02/22/22 0247 02/22/22 0506 02/22/22 1444 02/23/22 0301  NA 154* 158* 162* 160* 153*  K 4.0 3.6 3.5 4.0 3.7  CL 120* 122* 129* 125* 120*  CO2 18* 26 26 25 26    GLUCOSE 379* 205* 169* 198* 119*  BUN 72* 65* 64* 51* 38*  CREATININE 2.98* 2.28* 2.03* 1.72* 1.35*  CALCIUM 9.5 9.7 10.0 10.3 9.4  MG  --   --  2.9*  --   --   PHOS  --   --  1.4*  --   --    GFR: Estimated Creatinine Clearance: 71 mL/min (A) (by C-G formula based on SCr of 1.35 mg/dL (H)). Liver Function Tests: Recent Labs  Lab 02/21/22 1132  AST 10*  ALT 14  ALKPHOS 96  BILITOT 2.7*  PROT 9.1*  ALBUMIN 4.5   Recent Labs  Lab 02/21/22 1132  LIPASE 32   Recent Labs  Lab 02/21/22 1133  AMMONIA 14   Coagulation Profile: No results for input(s): "INR", "PROTIME" in the last 168 hours. Cardiac Enzymes: No results for input(s): "CKTOTAL", "CKMB", "CKMBINDEX", "TROPONINI" in the last 168 hours. BNP (last 3 results) No results for input(s): "PROBNP" in the last 8760 hours. HbA1C: No results for input(s): "HGBA1C" in the last 72 hours. CBG: Recent Labs  Lab 02/22/22 1622 02/22/22 1949 02/23/22 0003 02/23/22 0324 02/23/22 0831  GLUCAP 256* 220* 134* 83 222*   Lipid Profile: No results for input(s): "CHOL", "HDL", "LDLCALC", "TRIG", "CHOLHDL", "LDLDIRECT" in the last 72 hours. Thyroid Function Tests: No results for input(s): "TSH", "T4TOTAL", "FREET4", "T3FREE", "THYROIDAB" in the last 72 hours. Anemia Panel: No results for input(s): "VITAMINB12", "FOLATE", "FERRITIN", "TIBC", "IRON", "RETICCTPCT" in the last 72 hours. Sepsis Labs: Recent Labs  Lab 02/21/22 1132 02/21/22 1512  LATICACIDVEN 3.0* 1.8    Recent Results (from the past 240 hour(s))  Blood culture (routine x 2)     Status: None (Preliminary result)   Collection Time: 02/21/22 11:33 AM   Specimen: BLOOD  Result Value Ref Range Status   Specimen Description   Final    BLOOD RIGHT ANTECUBITAL Performed at [facility] [address], [place], [place] [address]    Special Requests   Final    BOTTLES DRAWN AEROBIC AND ANAEROBIC Blood Culture results may not be optimal due to  an excessive volume of blood received in culture bottles Performed at [facility] [address] [place], [place] [address]    Culture   Final    NO GROWTH 2 DAYS Performed at [facility] [address], [place], [place] [address]    Report Status PENDING  Incomplete  Blood culture (routine x 2)     Status: None (Preliminary result)   Collection Time: 02/21/22 11:38 AM   Specimen: BLOOD  Result Value Ref Range Status   Specimen Description   Final    BLOOD BLOOD LEFT FOREARM Performed at [facility] [address], [place], [place] [address]    Special Requests   Final    BOTTLES DRAWN AEROBIC ONLY Blood Culture results may not be optimal due to an inadequate volume of blood received in culture bottles Performed at [facility] [address], [place],  [address]    Culture   Final    NO GROWTH 2 DAYS Performed at [facility], [address], [place], [place] [address]    Report Status PENDING  Incomplete  MRSA Next Gen by PCR, Nasal     Status: None   Collection Time: 02/21/22  3:58 PM   Specimen: Nasal Mucosa; Nasal Swab  Result Value Ref Range Status   MRSA by PCR Next Gen NOT DETECTED NOT DETECTED Final    Comment: (NOTE) The GeneXpert MRSA Assay (FDA approved for NASAL specimens only), is one component of a comprehensive MRSA colonization surveillance program. It is not intended to diagnose MRSA infection nor to guide or monitor treatment for MRSA infections. Test performance is not FDA approved in patients less than 24 years old. Performed at [facility], [address], [place], [place] [address]   Urine Culture     Status: Abnormal   Collection Time: 02/22/22 11:00 AM   Specimen: Urine, Clean Catch  Result Value Ref Range Status   Specimen Description   Final    URINE, CLEAN CATCH Performed at [facility], [address], [place],  [place] [address]    Special Requests   Final    NONE Performed at [facility], [address], [place], [place] [address]    Culture (A)  Final    <10,000 COLONIES/mL INSIGNIFICANT GROWTH Performed at [facility], [address] [place], [place] [address]    Report Status 02/23/2022 FINAL  Final         Radiology Studies: DG Chest Portable 1 View  Result Date: 02/21/2022 CLINICAL DATA:  Hypoxia EXAM: PORTABLE CHEST 1 VIEW COMPARISON:  11/02/2021 FINDINGS: Previously seen central line has been removed. Heart size is normal. No focal airspace consolidation, pleural effusion, or pneumothorax. IMPRESSION: No active disease. Electronically Signed   By: Duanne Guess D.O.   On: 02/21/2022 12:26   CT HEAD WO CONTRAST ( )  Result Date: 02/21/2022 CLINICAL DATA:  Altered mental status, head trauma EXAM: CT HEAD WITHOUT CONTRAST TECHNIQUE: Contiguous axial images were obtained from the base of the skull through the vertex without intravenous contrast. RADIATION DOSE REDUCTION: This exam was performed according to the departmental dose-optimization program which includes automated exposure control, adjustment of the mA and/or kV according to patient size and/or use of iterative reconstruction technique. COMPARISON:  CT 11/02/2021 FINDINGS: Brain: No intracranial hemorrhage, mass effect, or evidence of acute infarct. No hydrocephalus. No extra-axial fluid collection. Vascular: No hyperdense vessel or unexpected calcification. Skull: No fracture or focal lesion. Sinuses/Orbits: No acute finding. Paranasal sinuses and mastoid air cells are well aerated. Other: None. IMPRESSION: No acute intracranial abnormality. Electronically Signed   By: Minerva Fester M.D.   On: 02/21/2022 12:21        Scheduled Meds:  atorvastatin  40 mg Oral Daily   Chlorhexidine Gluconate Cloth  6 each Topical Daily   free water  200 mL Per Tube Q4H   heparin  5,000 Units Subcutaneous Q8H    insulin aspart  0-15 Units Subcutaneous Q4H   pantoprazole (PROTONIX) IV  40 mg Intravenous Q24H   Continuous Infusions:  dextrose 5% lactated ringers Stopped (02/22/22 0901)   insulin Stopped (02/22/22 0901)   lactated ringers     lactated ringers Stopped (02/22/22 0009)          Glade Lloyd, MD [facility] 02/23/2022, 11:29 AM

## 2022-02-23 NOTE — Progress Notes (Signed)
[facility] ADULT ICU REPLACEMENT PROTOCOL   The patient does apply for the [facility] Adult ICU Electrolyte Replacment Protocol based on the criteria listed below:   1.Exclusion criteria: TCTS patients, ECMO patients, and Dialysis patients 2. Is GFR >/= 30 ml/min? Yes.    Patient's GFR today is >60 3. Is SCr </= 2? Yes.   Patient's SCr is 1.35 mg/dL 4. Did SCr increase >/= 0.5 in 24 hours? No. 5.Pt's weight >40kg  Yes.   6. Abnormal electrolyte(s): K+ 3.7  7. Electrolytes replaced per protocol 8.  Call MD STAT for K+ </= 2.5, Phos </= 1, or Mag </= 1 Physician:  n/a  Melvern Banker 02/23/2022 5:03 AM

## 2022-02-24 ENCOUNTER — Other Ambulatory Visit (HOSPITAL_COMMUNITY): Payer: Self-pay

## 2022-02-24 DIAGNOSIS — N179 Acute kidney failure, unspecified: Secondary | ICD-10-CM | POA: Diagnosis not present

## 2022-02-24 DIAGNOSIS — G9341 Metabolic encephalopathy: Secondary | ICD-10-CM | POA: Diagnosis not present

## 2022-02-24 DIAGNOSIS — E1110 Type 2 diabetes mellitus with ketoacidosis without coma: Secondary | ICD-10-CM | POA: Diagnosis not present

## 2022-02-24 LAB — COMPREHENSIVE METABOLIC PANEL
ALT: 18 U/L (ref 0–44)
AST: 18 U/L (ref 15–41)
Albumin: 2.8 g/dL — ABNORMAL LOW (ref 3.5–5.0)
Alkaline Phosphatase: 63 U/L (ref 38–126)
Anion gap: 5 (ref 5–15)
BUN: 22 mg/dL — ABNORMAL HIGH (ref 6–20)
CO2: 28 mmol/L (ref 22–32)
Calcium: 8.7 mg/dL — ABNORMAL LOW (ref 8.9–10.3)
Chloride: 111 mmol/L (ref 98–111)
Creatinine, Ser: 0.92 mg/dL (ref 0.61–1.24)
GFR, Estimated: >60 mL/min (ref >60)
Glucose, Bld: 109 mg/dL — ABNORMAL HIGH (ref 70–99)
Potassium: 3.1 mmol/L — ABNORMAL LOW (ref 3.5–5.1)
Sodium: 144 mmol/L (ref 135–145)
Total Bilirubin: 1.8 mg/dL — ABNORMAL HIGH (ref 0.3–1.2)
Total Protein: 5.7 g/dL — ABNORMAL LOW (ref 6.5–8.1)

## 2022-02-24 LAB — CBC WITH DIFFERENTIAL/PLATELET
Abs Immature Granulocytes: 0.02 10*3/uL (ref 0.00–0.07)
Basophils Absolute: 0 10*3/uL (ref 0.0–0.1)
Basophils Relative: 0 %
Eosinophils Absolute: 0.2 10*3/uL (ref 0.0–0.5)
Eosinophils Relative: 3 %
HCT: 28.4 % — ABNORMAL LOW (ref 39.0–52.0)
Hemoglobin: 9.5 g/dL — ABNORMAL LOW (ref 13.0–17.0)
Immature Granulocytes: 0 %
Lymphocytes Relative: 34 %
Lymphs Abs: 2 10*3/uL (ref 0.7–4.0)
MCH: 34.2 pg — ABNORMAL HIGH (ref 26.0–34.0)
MCHC: 33.5 g/dL (ref 30.0–36.0)
MCV: 102.2 fL — ABNORMAL HIGH (ref 80.0–100.0)
Monocytes Absolute: 0.4 10*3/uL (ref 0.1–1.0)
Monocytes Relative: 7 %
Neutro Abs: 3.2 10*3/uL (ref 1.7–7.7)
Neutrophils Relative %: 56 %
Platelets: 187 10*3/uL (ref 150–400)
RBC: 2.78 MIL/uL — ABNORMAL LOW (ref 4.22–5.81)
RDW: 11.5 % (ref 11.5–15.5)
WBC: 5.8 10*3/uL (ref 4.0–10.5)
nRBC: 0 % (ref 0.0–0.2)

## 2022-02-24 LAB — PHOSPHORUS: Phosphorus: 2.4 mg/dL — ABNORMAL LOW (ref 2.5–4.6)

## 2022-02-24 LAB — GLUCOSE, CAPILLARY
Glucose-Capillary: 105 mg/dL — ABNORMAL HIGH (ref 70–99)
Glucose-Capillary: 248 mg/dL — ABNORMAL HIGH (ref 70–99)
Glucose-Capillary: 311 mg/dL — ABNORMAL HIGH (ref 70–99)
Glucose-Capillary: 312 mg/dL — ABNORMAL HIGH (ref 70–99)

## 2022-02-24 LAB — VITAMIN B12: Vitamin B-12: 570 pg/mL (ref 180–914)

## 2022-02-24 LAB — TSH: TSH: 0.352 u[IU]/mL (ref 0.350–4.500)

## 2022-02-24 LAB — MAGNESIUM: Magnesium: 2 mg/dL (ref 1.7–2.4)

## 2022-02-24 MED ORDER — BASAGLAR KWIKPEN 100 UNIT/ML ~~LOC~~ SOPN
25.0000 [IU] | PEN_INJECTOR | Freq: Every day | SUBCUTANEOUS | 0 refills | Status: DC
  Filled 2022-02-24: qty 9, 36d supply, fill #0

## 2022-02-24 MED ORDER — FOLIC ACID 1 MG PO TABS
1.0000 mg | ORAL_TABLET | Freq: Every day | ORAL | 0 refills | Status: DC
  Filled 2022-02-24: qty 30, 30d supply, fill #0

## 2022-02-24 MED ORDER — POTASSIUM CHLORIDE CRYS ER 20 MEQ PO TBCR
40.0000 meq | EXTENDED_RELEASE_TABLET | ORAL | Status: DC
  Administered 2022-02-24: 40 meq via ORAL
  Filled 2022-02-24 (×2): qty 2

## 2022-02-24 MED ORDER — ATORVASTATIN CALCIUM 40 MG PO TABS
40.0000 mg | ORAL_TABLET | Freq: Every day | ORAL | 0 refills | Status: DC
  Filled 2022-02-24: qty 30, 30d supply, fill #0

## 2022-02-24 NOTE — Inpatient Diabetes Management (Signed)
Inpatient Diabetes Program Recommendations  AACE/ADA: New Consensus Statement on Inpatient Glycemic Control (2015)  Target Ranges:  Prepandial:   less than 140 mg/dL      Peak postprandial:   less than 180 mg/dL (1-2 hours)      Critically ill patients:  140 - 180 mg/dL   Lab Results  Component Value Date   GLUCAP 311 (H) 02/24/2022   HGBA1C 11.8 (H) 11/02/2021    Review of Glycemic Control  Inpatient Diabetes Program Recommendations:   Spoke with patient @ bedside and discussed need to take insulin as prescribed. Patient states he attempted to call the [facility] and Wellness several times without answer and left messages without a return call. Requested pt. To discuss with [facility] on next visit options for contact. Patient has my chart and agrees to start sending messages and requesting refills through my chart. Also asked pt. He might discuss with his physician how much [employer] [facility] 70/30 bid he could take if in the future he is unable to get his prescriptions filled.  Patient expressed appreciation for visit and that he is going to be more proactive in requesting his refills sooner when getting low.  Thank you, Stanley Edwards. Annalisa Colonna, RN, MSN, CDE  Diabetes Coordinator Inpatient Glycemic Control Team Team Pager [phone] (8am-5pm) 02/24/2022 1:32 PM

## 2022-02-24 NOTE — Progress Notes (Signed)
PT Cancellation Note  Patient Details Name: Stanley Edwards MRN: [id number] DOB: [birth year]   Cancelled Treatment:    Reason Eval/Treat Not Completed: PT screened, no needs identified, will sign off (Pt mobilizing independently on unit per RN. No skilled acute PT needs. Will sign off, reconsult if there is a change in status.)   Wynn Maudlin, DPT Acute Rehabilitation Services Office [phone] Pager [phone]  02/24/22 11:26 AM

## 2022-02-24 NOTE — Progress Notes (Signed)
This RN provided discharge instructions to patient. All questions answered. Belongings returned. PIVs removed. Patient transported via wheelchair by nurse tech to discharge lobby.

## 2022-02-24 NOTE — Discharge Summary (Signed)
Physician Discharge Summary  COEN MIYASATO [id number]:[id number] DOB: [birth year] DOA: 02/21/2022  PCP: Hoy Register, MD  Admit date: 02/21/2022 Discharge date: 02/24/2022  Admitted From: Home Disposition: Home  Recommendations for Outpatient Follow-up:  Follow up with PCP in 1 week with repeat CBC/BMP Comply with medications including insulin on discharge Follow up in ED if symptoms worsen or new appear   Home Health: No Equipment/Devices: None  Discharge Condition: Stable CODE STATUS: Full Diet recommendation: Heart healthy/carb modified  Brief/Interim Summary: 56 year old male with history of diabetes mellitus type 2, hypertension, hyperlipidemia was found on the floor poorly responsive with?  Potentially fall although not witnessed.  On presentation, he was found to have severe hyperglycemia along with anion gap metabolic acidosis with pH of 7.08, acute kidney injury, hyperkalemia with potassium of 6.5 and peaked T waves.  Chest x-ray was negative for infiltrates.  CT of the head was negative for acute intracranial abnormality.  He was admitted with DKA and acute metabolic encephalopathy and started on IV fluids and insulin drip.  Subsequently, he has been switched to subcutaneous long-acting insulin and mental status has improved.  He has been transferred to [facility] service from 02/23/2022 onwards.  Subsequently, he has remained stable.  He will be discharged home today with close outpatient follow-up with PCP.  Discharge Diagnoses:   DKA Possible noncompliance Diabetes mellitus type 2 uncontrolled -Presented with DKA and treated with IV fluids and insulin drip.  Subsequently, has been transitioned to long-acting insulin. -Blood sugars intermittently on the higher side.  Tolerating diet.  Continue carb modified diet.  Discharge patient home today on 25 units of long-acting insulin along with NovoLog with meals per sliding scale as per him.  Outpatient follow-up with PCP.  Acute metabolic  encephalopathy -Possibly from DKA.  CT of the head was negative for acute intracranial abnormality.  Mental status has much improved.  Outpatient follow-up.  Acute renal failure --Possibly from DKA and dehydration.  Treated with IV fluids.  Creatinine 0.9 today.  Outpatient follow-up.  Hypernatremia -Resolved.  Sodium 144 today.  Hypokalemia -Replace prior to discharge   Leukocytosis -possibly reactive.  Resolved.  Macrocytosis -Questionable cause.  Empirically started folic acid supplementation.  Vitamin B12 and TSH normal.  Hyperlipidemia -Continue statin    Discharge Instructions  Discharge Instructions     Diet - low sodium heart healthy   Complete by: As directed    Diet Carb Modified   Complete by: As directed    Increase activity slowly   Complete by: As directed       Allergies as of 02/24/2022   No Known Allergies      Medication List     STOP taking these medications    HumaLOG KwikPen 100 UNIT/ML KwikPen Generic drug: insulin lispro       TAKE these medications    atorvastatin 40 MG tablet Commonly known as: LIPITOR Take 1 tablet (40 mg total) by mouth daily.   Basaglar KwikPen 100 UNIT/ML Inject 25 Units into the skin daily. What changed: how much to take   folic acid 1 MG tablet Commonly known as: FOLVITE Take 1 tablet (1 mg total) by mouth daily.   NovoLOG FlexPen 100 UNIT/ML FlexPen Generic drug: insulin aspart Inject 10 Units into the skin 3 (three) times daily with meals.   Pentips 32G X 4 MM Misc Generic drug: Insulin Pen Needle Use to inject insulin 4 times daily.   True Metrix Blood Glucose Test test strip Generic drug: glucose blood  Use 3 times daily before meals   True Metrix Meter Devi 1 each by Does not apply route 3 (three) times daily before meals.        Follow-up Information     Hoy Register, MD. Schedule an appointment as soon as possible for a visit in 1 week(s).   Specialty: Family  Medicine Contact information: [address] [place] [address] [place] [address] [phone]                No Known Allergies  Consultations: PCCM   Procedures/Studies: DG Chest Portable 1 View  Result Date: 02/21/2022 CLINICAL DATA:  Hypoxia EXAM: PORTABLE CHEST 1 VIEW COMPARISON:  11/02/2021 FINDINGS: Previously seen central line has been removed. Heart size is normal. No focal airspace consolidation, pleural effusion, or pneumothorax. IMPRESSION: No active disease. Electronically Signed   By: Duanne Guess D.O.   On: 02/21/2022 12:26   CT HEAD WO CONTRAST ( )  Result Date: 02/21/2022 CLINICAL DATA:  Altered mental status, head trauma EXAM: CT HEAD WITHOUT CONTRAST TECHNIQUE: Contiguous axial images were obtained from the base of the skull through the vertex without intravenous contrast. RADIATION DOSE REDUCTION: This exam was performed according to the departmental dose-optimization program which includes automated exposure control, adjustment of the mA and/or kV according to patient size and/or use of iterative reconstruction technique. COMPARISON:  CT 11/02/2021 FINDINGS: Brain: No intracranial hemorrhage, mass effect, or evidence of acute infarct. No hydrocephalus. No extra-axial fluid collection. Vascular: No hyperdense vessel or unexpected calcification. Skull: No fracture or focal lesion. Sinuses/Orbits: No acute finding. Paranasal sinuses and mastoid air cells are well aerated. Other: None. IMPRESSION: No acute intracranial abnormality. Electronically Signed   By: Minerva Fester M.D.   On: 02/21/2022 12:21      Subjective: Patient seen and examined at bedside.  Feels much better and wants to go home today.  No overnight fever, vomiting, seizures reported.  Discharge Exam: Vitals:   02/24/22 0600 02/24/22 0800  BP: (!) 117/58 (!) 132/59  Pulse: 76 73  Resp: 20 18  Temp:  98.3 F (36.8 C)  SpO2: 99% 100%    General: Pt is alert, awake, not in acute  distress.  Still slow to respond but answers questions appropriately. Cardiovascular: rate controlled, S1/S2 + Respiratory: bilateral decreased breath sounds at bases Abdominal: Soft, NT, ND, bowel sounds + Extremities: no edema, no cyanosis    The results of significant diagnostics from this hospitalization (including imaging, microbiology, ancillary and laboratory) are listed below for reference.     Microbiology: Recent Results (from the past 240 hour(s))  Blood culture (routine x 2)     Status: None (Preliminary result)   Collection Time: 02/21/22 11:33 AM   Specimen: BLOOD  Result Value Ref Range Status   Specimen Description   Final    BLOOD RIGHT ANTECUBITAL Performed at [facility], [address] [place], [place] [address]    Special Requests   Final    BOTTLES DRAWN AEROBIC AND ANAEROBIC Blood Culture results may not be optimal due to an excessive volume of blood received in culture bottles Performed at [facility], [address] [place], [place] [address]    Culture   Final    NO GROWTH 2 DAYS Performed at [facility], [address], [place], [place] [address]    Report Status PENDING  Incomplete  Blood culture (routine x 2)     Status: None (Preliminary result)   Collection  Time: 02/21/22 11:38 AM   Specimen: BLOOD  Result Value Ref Range Status   Specimen Description   Final    BLOOD BLOOD LEFT FOREARM Performed at [facility], [address], [place], [place] [address]    Special Requests   Final    BOTTLES DRAWN AEROBIC ONLY Blood Culture results may not be optimal due to an inadequate volume of blood received in culture bottles Performed at [facility], [address], [place], [place] [address]    Culture   Final    NO GROWTH 2 DAYS Performed at [facility], [address], [place], [place] [address]    Report Status PENDING  Incomplete  MRSA Next Gen by  PCR, Nasal     Status: None   Collection Time: 02/21/22  3:58 PM   Specimen: Nasal Mucosa; Nasal Swab  Result Value Ref Range Status   MRSA by PCR Next Gen NOT DETECTED NOT DETECTED Final    Comment: (NOTE) The GeneXpert MRSA Assay (FDA approved for NASAL specimens only), is one component of a comprehensive MRSA colonization surveillance program. It is not intended to diagnose MRSA infection nor to guide or monitor treatment for MRSA infections. Test performance is not FDA approved in patients less than 41 years old. Performed at [facility], [address] [place], [place] [address]   Urine Culture     Status: Abnormal   Collection Time: 02/22/22 11:00 AM   Specimen: Urine, Clean Catch  Result Value Ref Range Status   Specimen Description   Final    URINE, CLEAN CATCH Performed at [facility], [address] [place], [place] [address]    Special Requests   Final    NONE Performed at [facility], [address] [place], [place] [address]    Culture (A)  Final    <10,000 COLONIES/mL INSIGNIFICANT GROWTH Performed at [facility], [address] [place], [place] [address]    Report Status 02/23/2022 FINAL  Final     Labs: BNP (last 3 results) No results for input(s): "BNP" in the last 8760 hours. Basic Metabolic Panel: Recent Labs  Lab 02/22/22 0247 02/22/22 0506 02/22/22 1444 02/23/22 0301 02/24/22 0309  NA 158* 162* 160* 153* 144  K 3.6 3.5 4.0 3.7 3.1*  CL 122* 129* 125* 120* 111  CO2 26 26 25 26 28   GLUCOSE 205* 169* 198* 119* 109*  BUN 65* 64* 51* 38* 22*  CREATININE 2.28* 2.03* 1.72* 1.35* 0.92  CALCIUM 9.7 10.0 10.3 9.4 8.7*  MG  --  2.9*  --   --  2.0  PHOS  --  1.4*  --   --  2.4*   Liver Function Tests: Recent Labs  Lab 02/21/22 1132 02/24/22 0309  AST 10* 18  ALT 14 18  ALKPHOS 96 63  BILITOT 2.7* 1.8*  PROT 9.1* 5.7*  ALBUMIN 4.5 2.8*   Recent Labs  Lab 02/21/22 1132   LIPASE 32   Recent Labs  Lab 02/21/22 1133  AMMONIA 14   CBC: Recent Labs  Lab 02/21/22 1132 02/22/22 0506 02/23/22 0301 02/24/22 0309  WBC 15.7* 11.8* 12.1* 5.8  NEUTROABS 14.6*  --   --  3.2  HGB 14.9 13.2 11.8* 9.5*  HCT 47.7 39.1 35.6* 28.4*  MCV 107.4* 100.5* 103.5* 102.2*  PLT 368 282 249 187   Cardiac Enzymes: No results for input(s): "CKTOTAL", "CKMB", "CKMBINDEX", "TROPONINI" in the  last 168 hours. BNP: Invalid input(s): "POCBNP" CBG: Recent Labs  Lab 02/23/22 1651 02/23/22 2001 02/23/22 2324 02/24/22 0308 02/24/22 0746  GLUCAP 324* 261* 138* 105* 248*   D-Dimer No results for input(s): "DDIMER" in the last 72 hours. Hgb A1c No results for input(s): "HGBA1C" in the last 72 hours. Lipid Profile No results for input(s): "CHOL", "HDL", "LDLCALC", "TRIG", "CHOLHDL", "LDLDIRECT" in the last 72 hours. Thyroid function studies Recent Labs    02/24/22 0309  TSH 0.352   Anemia work up Recent Labs    02/24/22 0309  VITAMINB12 570   Urinalysis    Component Value Date/Time   COLORURINE YELLOW 02/21/2022 1059   APPEARANCEUR HAZY (A) 02/21/2022 1059   LABSPEC 1.024 02/21/2022 1059   PHURINE 5.0 02/21/2022 1059   GLUCOSEU >=500 (A) 02/21/2022 1059   HGBUR NEGATIVE 02/21/2022 1059   BILIRUBINUR NEGATIVE 02/21/2022 1059   BILIRUBINUR negative 01/23/2018 1016   KETONESUR 20 (A) 02/21/2022 1059   PROTEINUR NEGATIVE 02/21/2022 1059   UROBILINOGEN 1.0 01/23/2018 1016   NITRITE NEGATIVE 02/21/2022 1059   LEUKOCYTESUR NEGATIVE 02/21/2022 1059   Sepsis Labs Recent Labs  Lab 02/21/22 1132 02/22/22 0506 02/23/22 0301 02/24/22 0309  WBC 15.7* 11.8* 12.1* 5.8   Microbiology Recent Results (from the past 240 hour(s))  Blood culture (routine x 2)     Status: None (Preliminary result)   Collection Time: 02/21/22 11:33 AM   Specimen: BLOOD  Result Value Ref Range Status   Specimen Description   Final    BLOOD RIGHT ANTECUBITAL Performed at [facility], [address] [place], [place] [address]    Special Requests   Final    BOTTLES DRAWN AEROBIC AND ANAEROBIC Blood Culture results may not be optimal due to an excessive volume of blood received in culture bottles Performed at [facility], [address] [place], [place] [address]    Culture   Final    NO GROWTH 2 DAYS Performed at [facility], [address] [place], [place] [address]    Report Status PENDING  Incomplete  Blood culture (routine x 2)     Status: None (Preliminary result)   Collection Time: 02/21/22 11:38 AM   Specimen: BLOOD  Result Value Ref Range Status   Specimen Description   Final    BLOOD BLOOD LEFT FOREARM Performed at [facility], [address] [place], [place] [address]    Special Requests   Final    BOTTLES DRAWN AEROBIC ONLY Blood Culture results may not be optimal due to an inadequate volume of blood received in culture bottles Performed at [facility], [address] [place], [place] [address]    Culture   Final    NO GROWTH 2 DAYS Performed at [facility], [address] [place], [place] [address]    Report Status PENDING  Incomplete  MRSA Next Gen by PCR, Nasal     Status: None   Collection Time: 02/21/22  3:58 PM   Specimen: Nasal Mucosa; Nasal Swab  Result Value Ref Range Status   MRSA by PCR Next Gen NOT DETECTED NOT DETECTED Final    Comment: (NOTE) The GeneXpert MRSA Assay (FDA approved for NASAL specimens only), is one component of a comprehensive MRSA colonization surveillance program. It is not intended to diagnose MRSA infection nor to guide or monitor treatment for MRSA infections. Test performance is not FDA approved in patients less than 2 years  old. Performed at [facility], [address] [place], [place] [address]   Urine Culture     Status: Abnormal   Collection Time: 02/22/22 11:00 AM   Specimen: Urine,  Clean Catch  Result Value Ref Range Status   Specimen Description   Final    URINE, CLEAN CATCH Performed at [facility], [address], [place], [place] [address]    Special Requests   Final    NONE Performed at [facility], [address], [place], [place] [address]    Culture (A)  Final    <10,000 COLONIES/mL INSIGNIFICANT GROWTH Performed at [facility], [address] [place], [place] [address]    Report Status 02/23/2022 FINAL  Final     Time coordinating discharge: 35 minutes  SIGNED:   Glade Lloyd, MD  [facility] 02/24/2022, 9:56 AM

## 2022-02-25 ENCOUNTER — Encounter: Payer: Commercial Managed Care - HMO | Admitting: Gastroenterology

## 2022-02-26 LAB — CULTURE, BLOOD (ROUTINE X 2)
Culture: NO GROWTH
Culture: NO GROWTH

## 2022-04-01 ENCOUNTER — Ambulatory Visit: Payer: Commercial Managed Care - HMO | Admitting: Family Medicine

## 2022-04-26 ENCOUNTER — Inpatient Hospital Stay (HOSPITAL_COMMUNITY)
Admission: EM | Admit: 2022-04-26 | Discharge: 2022-04-28 | DRG: 637 | Disposition: A | Discharge status: Home / Self Care | Payer: 59 | Attending: Internal Medicine | Admitting: Internal Medicine

## 2022-04-26 ENCOUNTER — Inpatient Hospital Stay (HOSPITAL_COMMUNITY): Payer: 59

## 2022-04-26 DIAGNOSIS — E785 Hyperlipidemia, unspecified: Secondary | ICD-10-CM | POA: Diagnosis not present

## 2022-04-26 DIAGNOSIS — Z87891 Personal history of nicotine dependence: Secondary | ICD-10-CM

## 2022-04-26 DIAGNOSIS — R0902 Hypoxemia: Secondary | ICD-10-CM | POA: Diagnosis not present

## 2022-04-26 DIAGNOSIS — Z91148 Patient's other noncompliance with medication regimen for other reason: Secondary | ICD-10-CM | POA: Diagnosis not present

## 2022-04-26 DIAGNOSIS — E1110 Type 2 diabetes mellitus with ketoacidosis without coma: Secondary | ICD-10-CM | POA: Diagnosis not present

## 2022-04-26 DIAGNOSIS — R4182 Altered mental status, unspecified: Secondary | ICD-10-CM | POA: Diagnosis not present

## 2022-04-26 DIAGNOSIS — R739 Hyperglycemia, unspecified: Secondary | ICD-10-CM | POA: Diagnosis not present

## 2022-04-26 DIAGNOSIS — E875 Hyperkalemia: Secondary | ICD-10-CM | POA: Diagnosis not present

## 2022-04-26 DIAGNOSIS — Z794 Long term (current) use of insulin: Secondary | ICD-10-CM | POA: Diagnosis not present

## 2022-04-26 DIAGNOSIS — R000 Tachycardia, unspecified: Secondary | ICD-10-CM | POA: Diagnosis not present

## 2022-04-26 DIAGNOSIS — Z833 Family history of diabetes mellitus: Secondary | ICD-10-CM

## 2022-04-26 DIAGNOSIS — I10 Essential (primary) hypertension: Secondary | ICD-10-CM | POA: Diagnosis present

## 2022-04-26 DIAGNOSIS — G9341 Metabolic encephalopathy: Secondary | ICD-10-CM | POA: Diagnosis not present

## 2022-04-26 DIAGNOSIS — E119 Type 2 diabetes mellitus without complications: Secondary | ICD-10-CM

## 2022-04-26 DIAGNOSIS — E1165 Type 2 diabetes mellitus with hyperglycemia: Secondary | ICD-10-CM

## 2022-04-26 DIAGNOSIS — E861 Hypovolemia: Secondary | ICD-10-CM | POA: Diagnosis not present

## 2022-04-26 DIAGNOSIS — N179 Acute kidney failure, unspecified: Secondary | ICD-10-CM | POA: Diagnosis present

## 2022-04-26 DIAGNOSIS — E1169 Type 2 diabetes mellitus with other specified complication: Secondary | ICD-10-CM | POA: Diagnosis present

## 2022-04-26 DIAGNOSIS — E876 Hypokalemia: Secondary | ICD-10-CM | POA: Diagnosis not present

## 2022-04-26 LAB — CBC WITH DIFFERENTIAL/PLATELET
Abs Immature Granulocytes: 0.02 10*3/uL (ref 0.00–0.07)
Basophils Absolute: 0.1 10*3/uL (ref 0.0–0.1)
Basophils Relative: 1 %
Eosinophils Absolute: 0 10*3/uL (ref 0.0–0.5)
Eosinophils Relative: 0 %
HCT: 45.9 % (ref 39.0–52.0)
Hemoglobin: 14.5 g/dL (ref 13.0–17.0)
Immature Granulocytes: 0 %
Lymphocytes Relative: 13 %
Lymphs Abs: 1 10*3/uL (ref 0.7–4.0)
MCH: 34.2 pg — ABNORMAL HIGH (ref 26.0–34.0)
MCHC: 31.6 g/dL (ref 30.0–36.0)
MCV: 108.3 fL — ABNORMAL HIGH (ref 80.0–100.0)
Monocytes Absolute: 0.3 10*3/uL (ref 0.1–1.0)
Monocytes Relative: 4 %
Neutro Abs: 6.1 10*3/uL (ref 1.7–7.7)
Neutrophils Relative %: 82 %
Platelets: 340 10*3/uL (ref 150–400)
RBC: 4.24 MIL/uL (ref 4.22–5.81)
RDW: 11.4 % — ABNORMAL LOW (ref 11.5–15.5)
WBC: 7.4 10*3/uL (ref 4.0–10.5)
nRBC: 0 % (ref 0.0–0.2)

## 2022-04-26 LAB — BASIC METABOLIC PANEL
Anion gap: 30 — ABNORMAL HIGH (ref 5–15)
Anion gap: 9 (ref 5–15)
Anion gap: 6 (ref 5–15)
BUN: 55 mg/dL — ABNORMAL HIGH (ref 6–20)
BUN: 42 mg/dL — ABNORMAL HIGH (ref 6–20)
BUN: 41 mg/dL — ABNORMAL HIGH (ref 6–20)
CO2: 12 mmol/L — ABNORMAL LOW (ref 22–32)
CO2: 26 mmol/L (ref 22–32)
CO2: 29 mmol/L (ref 22–32)
Calcium: 10.1 mg/dL (ref 8.9–10.3)
Calcium: 9.7 mg/dL (ref 8.9–10.3)
Calcium: 9.8 mg/dL (ref 8.9–10.3)
Chloride: 107 mmol/L (ref 98–111)
Chloride: 116 mmol/L — ABNORMAL HIGH (ref 98–111)
Chloride: 116 mmol/L — ABNORMAL HIGH (ref 98–111)
Creatinine, Ser: 2.52 mg/dL — ABNORMAL HIGH (ref 0.61–1.24)
Creatinine, Ser: 1.66 mg/dL — ABNORMAL HIGH (ref 0.61–1.24)
Creatinine, Ser: 1.69 mg/dL — ABNORMAL HIGH (ref 0.61–1.24)
GFR, Estimated: 29 mL/min — ABNORMAL LOW (ref >60)
GFR, Estimated: 48 mL/min — ABNORMAL LOW (ref >60)
GFR, Estimated: 47 mL/min — ABNORMAL LOW (ref >60)
Glucose, Bld: 551 mg/dL (ref 70–99)
Glucose, Bld: 200 mg/dL — ABNORMAL HIGH (ref 70–99)
Glucose, Bld: 151 mg/dL — ABNORMAL HIGH (ref 70–99)
Potassium: 5.6 mmol/L — ABNORMAL HIGH (ref 3.5–5.1)
Potassium: 3.8 mmol/L (ref 3.5–5.1)
Potassium: 3.7 mmol/L (ref 3.5–5.1)
Sodium: 149 mmol/L — ABNORMAL HIGH (ref 135–145)
Sodium: 151 mmol/L — ABNORMAL HIGH (ref 135–145)
Sodium: 151 mmol/L — ABNORMAL HIGH (ref 135–145)

## 2022-04-26 LAB — MRSA NEXT GEN BY PCR, NASAL: MRSA by PCR Next Gen: NOT DETECTED

## 2022-04-26 LAB — I-STAT CHEM 8, ED
BUN: 48 mg/dL — ABNORMAL HIGH (ref 6–20)
Calcium, Ion: 1.11 mmol/L — ABNORMAL LOW (ref 1.15–1.40)
Chloride: 113 mmol/L — ABNORMAL HIGH (ref 98–111)
Creatinine, Ser: 2.1 mg/dL — ABNORMAL HIGH (ref 0.61–1.24)
Glucose, Bld: 561 mg/dL (ref 70–99)
HCT: 44 % (ref 39.0–52.0)
Hemoglobin: 15 g/dL (ref 13.0–17.0)
Potassium: 4.8 mmol/L (ref 3.5–5.1)
Sodium: 148 mmol/L — ABNORMAL HIGH (ref 135–145)
TCO2: 12 mmol/L — ABNORMAL LOW (ref 22–32)

## 2022-04-26 LAB — BLOOD GAS, VENOUS
Acid-base deficit: 14.3 mmol/L — ABNORMAL HIGH (ref 0.0–2.0)
Bicarbonate: 12.5 mmol/L — ABNORMAL LOW (ref 20.0–28.0)
O2 Saturation: 86.9 %
Patient temperature: 37
pCO2, Ven: 32 mmHg — ABNORMAL LOW (ref 44–60)
pH, Ven: 7.2 — ABNORMAL LOW (ref 7.25–7.43)
pO2, Ven: 60 mmHg — ABNORMAL HIGH (ref 32–45)

## 2022-04-26 LAB — CBG MONITORING, ED
Glucose-Capillary: 525 mg/dL (ref 70–99)
Glucose-Capillary: 447 mg/dL — ABNORMAL HIGH (ref 70–99)
Glucose-Capillary: 458 mg/dL — ABNORMAL HIGH (ref 70–99)
Glucose-Capillary: 403 mg/dL — ABNORMAL HIGH (ref 70–99)
Glucose-Capillary: 288 mg/dL — ABNORMAL HIGH (ref 70–99)

## 2022-04-26 LAB — GLUCOSE, CAPILLARY
Glucose-Capillary: 219 mg/dL — ABNORMAL HIGH (ref 70–99)
Glucose-Capillary: 207 mg/dL — ABNORMAL HIGH (ref 70–99)
Glucose-Capillary: 204 mg/dL — ABNORMAL HIGH (ref 70–99)
Glucose-Capillary: 168 mg/dL — ABNORMAL HIGH (ref 70–99)
Glucose-Capillary: 164 mg/dL — ABNORMAL HIGH (ref 70–99)
Glucose-Capillary: 168 mg/dL — ABNORMAL HIGH (ref 70–99)

## 2022-04-26 LAB — BETA-HYDROXYBUTYRIC ACID
Beta-Hydroxybutyric Acid: >8 mmol/L — ABNORMAL HIGH (ref 0.05–0.27)
Beta-Hydroxybutyric Acid: 2.08 mmol/L — ABNORMAL HIGH (ref 0.05–0.27)

## 2022-04-26 LAB — HEMOGLOBIN A1C
Hgb A1c MFr Bld: 10.5 % — ABNORMAL HIGH (ref 4.8–5.6)
Mean Plasma Glucose: 254.65 mg/dL

## 2022-04-26 MED ORDER — ORAL CARE MOUTH RINSE: 15.0000 mL | OROMUCOSAL | Status: DC | PRN

## 2022-04-26 MED ORDER — ATORVASTATIN CALCIUM 40 MG PO TABS
40.0000 mg | ORAL_TABLET | Freq: Every day | ORAL | Status: DC
  Administered 2022-04-27 – 2022-04-28 (×2): 40 mg via ORAL
  Filled 2022-04-26 (×2): qty 1

## 2022-04-26 MED ORDER — DEXTROSE IN LACTATED RINGERS 5 % IV SOLN: INTRAVENOUS | Status: DC

## 2022-04-26 MED ORDER — CHLORHEXIDINE GLUCONATE CLOTH 2 % EX PADS
6.0000 | MEDICATED_PAD | Freq: Every day | CUTANEOUS | Status: DC
  Administered 2022-04-26: 6 via TOPICAL

## 2022-04-26 MED ORDER — LACTATED RINGERS IV BOLUS: 20.0000 mL/kg | Freq: Once | INTRAVENOUS | Status: AC

## 2022-04-26 MED ORDER — DEXTROSE 50 % IV SOLN: 0.0000 mL | INTRAVENOUS | Status: DC | PRN

## 2022-04-26 MED ORDER — ENOXAPARIN SODIUM 40 MG/0.4ML IJ SOSY
40.0000 mg | PREFILLED_SYRINGE | INTRAMUSCULAR | Status: DC
  Administered 2022-04-26 – 2022-04-27 (×2): 40 mg via SUBCUTANEOUS
  Filled 2022-04-26 (×2): qty 0.4

## 2022-04-26 MED ORDER — LACTATED RINGERS IV SOLN: INTRAVENOUS | Status: DC

## 2022-04-26 MED ORDER — POTASSIUM CHLORIDE 10 MEQ/100ML IV SOLN
10.0000 meq | INTRAVENOUS | Status: AC
  Administered 2022-04-26 (×2): 10 meq via INTRAVENOUS
  Filled 2022-04-26 (×2): qty 100

## 2022-04-26 MED ORDER — INSULIN REGULAR(HUMAN) IN NACL 100-0.9 UT/100ML-% IV SOLN
INTRAVENOUS | Status: DC
  Administered 2022-04-26: 12 [IU]/h via INTRAVENOUS
  Administered 2022-04-26: 3.8 [IU]/h via INTRAVENOUS
  Filled 2022-04-26 (×2): qty 100

## 2022-04-26 MED ORDER — LACTATED RINGERS IV BOLUS
500.0000 mL | Freq: Once | INTRAVENOUS | Status: AC
  Administered 2022-04-26: 500 mL via INTRAVENOUS

## 2022-04-26 NOTE — Progress Notes (Signed)
On initial assessment patient refuses to answer orientation questions. Would nod appropriately to all questions asked but will not speak to this RN. Patient moved self from stretcher to bed on command. Per employee who transferred patient, he is upset due to not being able to eat at this time and stopped talking to her also.   Follows commands appropriately. Unable to complete hospital admission assessment at this time.

## 2022-04-26 NOTE — ED Notes (Signed)
Pt urinated all over floor.

## 2022-04-26 NOTE — H&P (Signed)
History and Physical    Patient: Stanley Edwards GQQ:[id number] DOB: 1965/08/29 DOA: 04/26/2022 DOS: the patient was seen and examined on 04/26/2022 PCP: Charlott Rakes, MD  Patient coming from: Home  Chief Complaint:  Chief Complaint  Patient presents with   Weakness   Hyperglycemia   Altered Mental Status   HPI: Stanley Edwards is a 56 y.o. male with medical history significant of DM2, HTN, HLD. Presenting with N/V and lethargy. History is from his sister. She reports that he's been lethargic for the last few days. She was worried that he may be having another episode of problems with his sugar control, so when she checked on him, she asked if he was taking his insulin. He says he has been, but she didn't believe him. He had an episode of N/V last night. When she went to check on him this morning, he seemed more confused and lethargic. She became concerned and called EMS. She denies any other aggravating or alleviating factors.   Review of Systems: As mentioned in the history of present illness. All other systems reviewed and are negative. Past Medical History:  Diagnosis Date   Allergy    Diabetes mellitus without complication ([place])    Hypertension    Pt denies htn but it was on his previous hx   PSHx Reviewed. No pertinent surgical history.  Social History:  reports that he has quit smoking. His smoking use included cigarettes. He has never used smokeless tobacco. He reports current alcohol use of about 1.0 standard drink of alcohol per week. He reports that he does not use drugs.  No Known Allergies  Family History  Problem Relation Age of Onset   Cancer Mother    Diabetes Father     Prior to Admission medications   Medication Sig Start Date End Date Taking? Authorizing Provider  insulin aspart (NOVOLOG FLEXPEN) 100 UNIT/ML FlexPen Inject 10 Units into the skin 3 (three) times daily with meals.   Yes [provider]  Insulin Glargine (BASAGLAR KWIKPEN) 100  UNIT/ML Inject 25 Units into the skin daily. 02/24/22  Yes Aline August, MD  atorvastatin (LIPITOR) 40 MG tablet Take 1 tablet (40 mg total) by mouth daily. Patient not taking: Reported on 04/26/2022 02/24/22   Aline August, MD  Blood Glucose Monitoring Suppl (TRUE METRIX METER) DEVI 1 each by Does not apply route 3 (three) times daily before meals. 05/30/20   Charlott Rakes, MD  folic acid (FOLVITE) 1 MG tablet Take 1 tablet (1 mg total) by mouth daily. Patient not taking: Reported on 04/26/2022 02/24/22   Aline August, MD  glucose blood (TRUE METRIX BLOOD GLUCOSE TEST) test strip Use 3 times daily before meals 05/30/20   Charlott Rakes, MD  Insulin Pen Needle 32G X 4 MM MISC Use to inject insulin 4 times daily. 11/07/21   Allie Bossier, MD    Physical Exam: Vitals:   04/26/22 1100 04/26/22 1115 04/26/22 1200 04/26/22 1230  BP: 128/80 118/81 134/80 122/80  Pulse: (!) 125 (!) 121 (!) 117 (!) 122  Resp: 20 14 11 17   Temp:      TempSrc:      SpO2: 100% 100% 100% 100%  Weight:      Height:       General: 56 y.o. male resting in bed in NAD Eyes: PERRL, normal sclera ENMT: Nares patent w/o discharge, orophaynx clear, dentition normal, ears w/o discharge/lesions/ulcers Neck: Supple, trachea midline Cardiovascular: RRR, +S1, S2, no m/g/r, equal pulses throughout  Respiratory: CTABL, no w/r/r, normal WOB GI: BS+, NDNT, no masses noted, no organomegaly noted MSK: No e/c/c Skin: No rashes, bruises, ulcerations noted Neuro: A&O x 3, no focal deficits Psyc: Flat affect, calm/cooperative, but confused  Data Reviewed:  Results for orders placed or performed during the hospital encounter of 04/26/22 (from the past 24 hour(s))  CBG monitoring, ED     Status: Abnormal   Collection Time: 04/26/22 10:37 AM  Result Value Ref Range   Glucose-Capillary 525 (HH) 70 - 99 mg/dL   Comment 1 Document in Chart   CBC with Differential (PNL)     Status: Abnormal   Collection Time: 04/26/22 10:38 AM   Result Value Ref Range   WBC 7.4 4.0 - 10.5 K/uL   RBC 4.24 4.22 - 5.81 MIL/uL   Hemoglobin 14.5 13.0 - 17.0 g/dL   HCT 11.9 41.7 - 40.8 %   MCV 108.3 (H) 80.0 - 100.0 fL   MCH 34.2 (H) 26.0 - 34.0 pg   MCHC 31.6 30.0 - 36.0 g/dL   RDW 14.4 (L) 81.8 - 56.3 %   Platelets 340 150 - 400 K/uL   nRBC 0.0 0.0 - 0.2 %   Neutrophils Relative % 82 %   Neutro Abs 6.1 1.7 - 7.7 K/uL   Lymphocytes Relative 13 %   Lymphs Abs 1.0 0.7 - 4.0 K/uL   Monocytes Relative 4 %   Monocytes Absolute 0.3 0.1 - 1.0 K/uL   Eosinophils Relative 0 %   Eosinophils Absolute 0.0 0.0 - 0.5 K/uL   Basophils Relative 1 %   Basophils Absolute 0.1 0.0 - 0.1 K/uL   Immature Granulocytes 0 %   Abs Immature Granulocytes 0.02 0.00 - 0.07 K/uL  Blood gas, venous     Status: Abnormal   Collection Time: 04/26/22 10:38 AM  Result Value Ref Range   pH, Ven 7.2 (L) 7.25 - 7.43   pCO2, Ven 32 (L) 44 - 60 mmHg   pO2, Ven 60 (H) 32 - 45 mmHg   Bicarbonate 12.5 (L) 20.0 - 28.0 mmol/L   Acid-base deficit 14.3 (H) 0.0 - 2.0 mmol/L   O2 Saturation 86.9 %   Patient temperature 37.0   I-stat chem 8, ED (not at [facility] or [facility])     Status: Abnormal   Collection Time: 04/26/22 11:13 AM  Result Value Ref Range   Sodium 148 (H) 135 - 145 mmol/L   Potassium 4.8 3.5 - 5.1 mmol/L   Chloride 113 (H) 98 - 111 mmol/L   BUN 48 (H) 6 - 20 mg/dL   Creatinine, Ser 1.49 (H) 0.61 - 1.24 mg/dL   Glucose, Bld 702 (HH) 70 - 99 mg/dL   Calcium, Ion 6.37 (L) 1.15 - 1.40 mmol/L   TCO2 12 (L) 22 - 32 mmol/L   Hemoglobin 15.0 13.0 - 17.0 g/dL   HCT 85.8 85.0 - 27.7 %  CBG monitoring, ED     Status: Abnormal   Collection Time: 04/26/22 11:56 AM  Result Value Ref Range   Glucose-Capillary 447 (H) 70 - 99 mg/dL  CBG monitoring, ED     Status: Abnormal   Collection Time: 04/26/22 12:43 PM  Result Value Ref Range   Glucose-Capillary 458 (H) 70 - 99 mg/dL   EKG: sinus tach, no st elevation  Assessment and Plan: DKA Uncontrolled DM2     - admit  to inpt, SDU     - continue endotool: insulin gtt, fluids     - follow BMP,  betahydroxybutyric acid     - NPO except for non-caloric fluids; meds     - anti-emetics  AKI     - fluids     - renal US     - watch nephrotoxins  HLD     - continue home regimen when confirmed  Acute metabolic encephalopathy     - likely secondary to DKA     - no focality on exam; CTH pending   Advance Care Planning:   Code Status: FULL  Consults: None  Family Communication: w/ sister by phone  Severity of Illness: The appropriate patient status for this patient is INPATIENT. Inpatient status is judged to be reasonable and necessary in order to provide the required intensity of service to ensure the patient's safety. The patient's presenting symptoms, physical exam findings, and initial radiographic and laboratory data in the context of their chronic comorbidities is felt to place them at high risk for further clinical deterioration. Furthermore, it is not anticipated that the patient will be medically stable for discharge from the hospital within 2 midnights of admission.   * I certify that at the point of admission it is my clinical judgment that the patient will require inpatient hospital care spanning beyond 2 midnights from the point of admission due to high intensity of service, high risk for further deterioration and high frequency of surveillance required.*  Time spent in coordination of this H&P: 45 minutes  Author: Teddy Spike, DO 04/26/2022 12:55 PM  For on call review [email or web address].

## 2022-04-26 NOTE — ED Provider Notes (Signed)
[facility] DEPT Provider Note   CSN: [id number] Arrival date & time: 04/26/22  1015     History  Chief Complaint  Patient presents with   Weakness   Hyperglycemia   Altered Mental Status    Stanley Edwards is a 56 y.o. male with a past medical history of type 2 diabetes presenting today with elevated blood sugars.  Reports have been elevated for a couple of days.  This morning it was over 500 and his sister called 911.  Also says that he has felt weak and is experiencing polydipsia and polyuria.  1 episode of emesis yesterday.  Reports being compliant with this medication.  Denies any confusion. Reports being compliant with his medication.   Weakness Hyperglycemia Associated symptoms: altered mental status and weakness   Altered Mental Status Associated symptoms: weakness        Home Medications Prior to Admission medications   Medication Sig Start Date End Date Taking? Authorizing Provider  atorvastatin (LIPITOR) 40 MG tablet Take 1 tablet (40 mg total) by mouth daily. 02/24/22   Aline August, MD  Blood Glucose Monitoring Suppl (TRUE METRIX METER) DEVI 1 each by Does not apply route 3 (three) times daily before meals. 05/30/20   Charlott Rakes, MD  folic acid (FOLVITE) 1 MG tablet Take 1 tablet (1 mg total) by mouth daily. 02/24/22   Aline August, MD  glucose blood (TRUE METRIX BLOOD GLUCOSE TEST) test strip Use 3 times daily before meals 05/30/20   Newlin, [name], MD  insulin aspart (NOVOLOG FLEXPEN) 100 UNIT/ML FlexPen Inject 10 Units into the skin 3 (three) times daily with meals.    [provider]  Insulin Glargine (BASAGLAR KWIKPEN) 100 UNIT/ML Inject 25 Units into the skin daily. 02/24/22   Aline August, MD  Insulin Pen Needle 32G X 4 MM MISC Use to inject insulin 4 times daily. 11/07/21   Allie Bossier, MD      Allergies    Patient has no known allergies.    Review of Systems   Review of Systems  Neurological:   Positive for weakness.    Physical Exam Updated Vital Signs There were no vitals taken for this visit. Physical Exam Vitals and nursing note reviewed.  Constitutional:      General: He is not in acute distress.    Appearance: Normal appearance. He is not ill-appearing.  HENT:     Head: Normocephalic and atraumatic.     Mouth/Throat:     Mouth: Mucous membranes are moist.     Pharynx: Oropharynx is clear.  Eyes:     General: No scleral icterus.    Conjunctiva/sclera: Conjunctivae normal.  Cardiovascular:     Rate and Rhythm: Regular rhythm. Tachycardia present.  Pulmonary:     Effort: Pulmonary effort is normal. No respiratory distress.  Abdominal:     General: Abdomen is flat.     Palpations: Abdomen is soft.     Tenderness: There is no abdominal tenderness.  Skin:    General: Skin is warm and dry.     Findings: No rash.  Neurological:     Mental Status: He is alert.  Psychiatric:        Mood and Affect: Mood normal.     ED Results / Procedures / Treatments   Labs (all labs ordered are listed, but only abnormal results are displayed) Labs Reviewed  CBC WITH DIFFERENTIAL/PLATELET - Abnormal; Notable for the following components:      Result Value  MCV 108.3 (*)    MCH 34.2 (*)    RDW 11.4 (*)    All other components within normal limits  BLOOD GAS, VENOUS - Abnormal; Notable for the following components:   pH, Ven 7.2 (*)    pCO2, Ven 32 (*)    pO2, Ven 60 (*)    Bicarbonate 12.5 (*)    Acid-base deficit 14.3 (*)    All other components within normal limits  CBG MONITORING, ED - Abnormal; Notable for the following components:   Glucose-Capillary 525 (*)    All other components within normal limits  I-STAT CHEM 8, ED - Abnormal; Notable for the following components:   Sodium 148 (*)    Chloride 113 (*)    BUN 48 (*)    Creatinine, Ser 2.10 (*)    Glucose, Bld 561 (*)    Calcium, Ion 1.11 (*)    TCO2 12 (*)    All other components within normal limits   CBG MONITORING, ED - Abnormal; Notable for the following components:   Glucose-Capillary 447 (*)    All other components within normal limits  CBG MONITORING, ED - Abnormal; Notable for the following components:   Glucose-Capillary 458 (*)    All other components within normal limits  BASIC METABOLIC PANEL  BASIC METABOLIC PANEL  BASIC METABOLIC PANEL  BASIC METABOLIC PANEL  BETA-HYDROXYBUTYRIC ACID  BETA-HYDROXYBUTYRIC ACID  URINALYSIS, ROUTINE W REFLEX MICROSCOPIC    EKG None  Radiology No results found.  Procedures .Critical Care  Performed by: Rhae Hammock, PA-C Authorized by: Rhae Hammock, PA-C   Critical care provider statement:    Critical care time (minutes):  30   Critical care time was exclusive of:  Separately billable procedures and treating other patients   Critical care was necessary to treat or prevent imminent or life-threatening deterioration of the following conditions:  Dehydration and endocrine crisis   Critical care was time spent personally by me on the following activities:  Development of treatment plan with patient or surrogate, discussions with consultants, discussions with primary provider, evaluation of patient's response to treatment, obtaining history from patient or surrogate, ordering and review of laboratory studies, ordering and review of radiographic studies, pulse oximetry, re-evaluation of patient's condition, review of old charts and ordering and performing treatments and interventions   I assumed direction of critical care for this patient from another provider in my specialty: no     Care discussed with: admitting provider      Medications Ordered in ED Medications  insulin regular, human (MYXREDLIN) 100 units/ 100 mL infusion (12 Units/hr Intravenous Infusion Verify 04/26/22 1252)  lactated ringers infusion ( Intravenous New Bag/Given 04/26/22 1200)  dextrose 5 % in lactated ringers infusion (0 mLs Intravenous Hold 04/26/22  1201)  dextrose 50 % solution 0-50 mL (has no administration in time range)  potassium chloride 10 mEq in 100 mL IVPB (10 mEq Intravenous New Bag/Given 04/26/22 1255)  lactated ringers bolus 20 mL/kg ( Intravenous New Bag/Given 04/26/22 1109)  lactated ringers bolus 500 mL (500 mLs Intravenous New Bag/Given 04/26/22 1201)    ED Course/ Medical Decision Making/ A&P                           Medical Decision Making Amount and/or Complexity of Data Reviewed Labs: ordered.  Risk Prescription drug management. Decision regarding hospitalization.   This is a 55 year old male with a past medical history of diabetes presenting  with hyperglycemia.  Differential includes but is not limited to HHS, DKA, benign hyperglycemia.   This is not an exhaustive differential.    Past Medical History / Co-morbidities / Social History: T2DM, medication noncompliance   Additional history: Per chart review patient has numerous visits for hyperglycemia and DKA.  Each time it has been suspected the patient is not taking his medication as prescribed.   Physical Exam: Pertinent physical exam findings include Dry MM Tachycardic pH 7.2, bicarb 12.5, PCO2 32 Potassium 4.8 Creatinine 2.1, BUN 48 Sodium 148  Lab Tests: I ordered, and personally interpreted labs.  The pertinent results include: Blood glucose 525     Cardiac Monitoring:  The patient was maintained on a cardiac monitor.  My attending physician Dr. Doren Custard viewed and interpreted the cardiac monitored which showed an underlying rhythm of: Sinus tach   Medications: I ordered LR and placed the patient on Endo tool.  He denies any active nausea or pain so no other medications were ordered  Repeat labs with decreasing blood glucose  MDM/Disposition: This is a 56 year old male presenting with hyperglycemia.  Found to be in DKA.  Has a history of this.  Requires admission for further evaluation and continued treatment.  He is agreeable.  Admit to  Dr. Marylyn Ishihara    I discussed this case with my attending physician Dr. Doren Custard who cosigned this note including patient's presenting symptoms, physical exam, and planned diagnostics and interventions. Attending physician stated agreement with plan or made changes to plan which were implemented.      Final Clinical Impression(s) / ED Diagnoses Final diagnoses:  Diabetic ketoacidosis without coma associated with type 2 diabetes mellitus ([place])    Rx / DC Orders ED Discharge Orders     None      Admit to Dr. Marylyn Ishihara for DKA   Rhae Hammock, PA-C 04/26/22 1259    Godfrey Pick, MD 04/27/22 1359

## 2022-04-26 NOTE — ED Triage Notes (Signed)
Pt bib ems from home with reports of difficulty following commands.Pt alert and oriented X4, GCS 14. Hyperglycemia and weakness. Family told ems pt has not been acting himself the last few days. 1 episode of emesis yesterday.  CBG 573, non compliant with medications.  HR 138 ST 118/70 98% RA

## 2022-04-27 ENCOUNTER — Other Ambulatory Visit: Payer: Self-pay

## 2022-04-27 DIAGNOSIS — Z794 Long term (current) use of insulin: Secondary | ICD-10-CM | POA: Diagnosis not present

## 2022-04-27 DIAGNOSIS — G9341 Metabolic encephalopathy: Secondary | ICD-10-CM

## 2022-04-27 DIAGNOSIS — E1165 Type 2 diabetes mellitus with hyperglycemia: Secondary | ICD-10-CM

## 2022-04-27 DIAGNOSIS — N179 Acute kidney failure, unspecified: Secondary | ICD-10-CM

## 2022-04-27 LAB — BASIC METABOLIC PANEL
Anion gap: 10 (ref 5–15)
BUN: 39 mg/dL — ABNORMAL HIGH (ref 6–20)
CO2: 26 mmol/L (ref 22–32)
Calcium: 9.5 mg/dL (ref 8.9–10.3)
Chloride: 114 mmol/L — ABNORMAL HIGH (ref 98–111)
Creatinine, Ser: 1.75 mg/dL — ABNORMAL HIGH (ref 0.61–1.24)
GFR, Estimated: 45 mL/min — ABNORMAL LOW (ref >60)
Glucose, Bld: 156 mg/dL — ABNORMAL HIGH (ref 70–99)
Potassium: 3.7 mmol/L (ref 3.5–5.1)
Sodium: 150 mmol/L — ABNORMAL HIGH (ref 135–145)

## 2022-04-27 LAB — GLUCOSE, CAPILLARY
Glucose-Capillary: 107 mg/dL — ABNORMAL HIGH (ref 70–99)
Glucose-Capillary: 116 mg/dL — ABNORMAL HIGH (ref 70–99)
Glucose-Capillary: 132 mg/dL — ABNORMAL HIGH (ref 70–99)
Glucose-Capillary: 148 mg/dL — ABNORMAL HIGH (ref 70–99)
Glucose-Capillary: 153 mg/dL — ABNORMAL HIGH (ref 70–99)
Glucose-Capillary: 154 mg/dL — ABNORMAL HIGH (ref 70–99)
Glucose-Capillary: 155 mg/dL — ABNORMAL HIGH (ref 70–99)
Glucose-Capillary: 171 mg/dL — ABNORMAL HIGH (ref 70–99)
Glucose-Capillary: 200 mg/dL — ABNORMAL HIGH (ref 70–99)
Glucose-Capillary: 320 mg/dL — ABNORMAL HIGH (ref 70–99)
Glucose-Capillary: 93 mg/dL (ref 70–99)

## 2022-04-27 LAB — BETA-HYDROXYBUTYRIC ACID: Beta-Hydroxybutyric Acid: 3.34 mmol/L — ABNORMAL HIGH (ref 0.05–0.27)

## 2022-04-27 MED ORDER — INSULIN GLARGINE-YFGN 100 UNIT/ML ~~LOC~~ SOLN
20.0000 [IU] | Freq: Every day | SUBCUTANEOUS | Status: DC
  Administered 2022-04-27 – 2022-04-28 (×2): 20 [IU] via SUBCUTANEOUS
  Filled 2022-04-27 (×2): qty 0.2

## 2022-04-27 MED ORDER — INSULIN ASPART 100 UNIT/ML IJ SOLN
0.0000 [IU] | Freq: Three times a day (TID) | INTRAMUSCULAR | Status: DC
  Administered 2022-04-27: 11 [IU] via SUBCUTANEOUS
  Administered 2022-04-27: 2 [IU] via SUBCUTANEOUS
  Administered 2022-04-28: 3 [IU] via SUBCUTANEOUS

## 2022-04-27 MED ORDER — INSULIN ASPART 100 UNIT/ML IJ SOLN: 0.0000 [IU] | Freq: Every day | INTRAMUSCULAR | Status: DC

## 2022-04-27 MED ORDER — SODIUM CHLORIDE 0.45 % IV SOLN: INTRAVENOUS | Status: DC

## 2022-04-27 MED ORDER — INSULIN GLARGINE-YFGN 100 UNIT/ML ~~LOC~~ SOLN
8.0000 [IU] | Freq: Every day | SUBCUTANEOUS | Status: DC
  Administered 2022-04-27: 8 [IU] via SUBCUTANEOUS
  Filled 2022-04-27 (×2): qty 0.08

## 2022-04-27 NOTE — Progress Notes (Signed)
[facility] PROGRESS NOTE   Stanley Edwards [id number] DOB: 02-23-1966 DOA: 04/26/2022  PCP: Hoy Register, MD  Brief History/Interval Summary: 56 y.o. male with medical history significant of DM2, HTN, HLD.  Presented with N/V and lethargy.  Patient's sister noted that he had been lethargic for a few days.  She was concerned about high glucose levels.  She called EMS and the patient was brought into the hospital.  He was found to be in DKA.  He was hospitalized.    Consultants: None  Procedures: None    Subjective/Interval History: Patient mentions that he is feeling better this morning.  Mentions that he ran out of his insulin.  It appears that there are also compliance issues as per H&P.  Patient denies any nausea vomiting this morning.     Assessment/Plan:  Diabetic ketoacidosis in the setting of diabetes mellitus type 2, uncontrolled with hyperglycemia Likely precipitated by noncompliance.  Was hospitalized in July for same.  Patient had high anion gap and elevated beta hydroxybutyric acid levels.  Glucose levels were also elevated.  Patient was started on insulin infusion and was hospitalized. HbA1c is 10.5 Patient anion gap is closed.  Beta hydroxybutyrate acid levels have improved.  Transition to subcutaneous insulin.  It appears that he takes Basaglar insulin at home.  We will initiate glargine while he is here in the hospital.  SSI.  Initiate diet.  Recheck labs tomorrow morning.  Acute kidney injury/hyperkalemia Presented with creatinine of 2.52.  Unclear if patient has chronic kidney disease or not.  Looks like he was hospitalized in July he again had acute kidney injury at that time.  When he was discharged his creatinine was 0.92. Monitor urine output.  Creatinine has improved but still noted to be elevated at 1.75.  We will continue with IV fluids.  Sodium level noted to be elevated.  Likely due to hypovolemia.  Acute metabolic encephalopathy Most likely  due to DKA.  Appears to have resolved.  No focal neurological deficits.  CT head was unremarkable.  Hyperlipidemia Continue statin.  DVT Prophylaxis: Lovenox Code Status: Full code Family Communication: Discussed with the patient Disposition Plan: Hopefully return home when improved  Status is: Inpatient Remains inpatient appropriate because: Diabetic ketoacidosis, uncontrolled diabetes      Medications: Scheduled:  atorvastatin  40 mg Oral Daily   Chlorhexidine Gluconate Cloth  6 each Topical Daily   enoxaparin (LOVENOX) injection  40 mg Subcutaneous Q24H   insulin aspart  0-15 Units Subcutaneous TID WC   insulin aspart  0-5 Units Subcutaneous QHS   insulin glargine-yfgn  20 Units Subcutaneous Daily   Continuous:  dextrose 5% lactated ringers 125 mL/hr at 04/27/22 0740   insulin 1.7 Units/hr (04/27/22 0625)   [id number], mouth rinse  Antibiotics: Anti-infectives (From admission, onward)    None       Objective:  Vital Signs  Vitals:   04/27/22 0500 04/27/22 0600 04/27/22 0700 04/27/22 0800  BP: 114/64 (!) 125/50 112/67   Pulse: 92 80 89   Resp: 16 15 16    Temp:    98.2 F (36.8 C)  TempSrc:    Oral  SpO2: 99% 99% 99%   Weight:      Height:        Intake/Output Summary (Last 24 hours) at 04/27/2022 0935 Last data filed at 04/27/2022 0300 Gross per 24 hour  Intake 1929.04 ml  Output --  Net 1929.04 ml   Filed Weights   04/26/22 1052  Weight: 81.6 kg    General appearance: Awake alert.  In no distress Resp: Clear to auscultation bilaterally.  Normal effort Cardio: S1-S2 is normal regular.  No S3-S4.  No rubs murmurs or bruit GI: Abdomen is soft.  Nontender nondistended.  Bowel sounds are present normal.  No masses organomegaly Extremities: No edema.  Full range of motion of lower extremities. Neurologic: Alert and oriented x3.  No focal neurological deficits.    Lab Results:  Data Reviewed: I have personally reviewed following labs and  reports of the imaging studies  CBC: Recent Labs  Lab 04/26/22 1038 04/26/22 1113  WBC 7.4  --   NEUTROABS 6.1  --   HGB 14.5 15.0  HCT 45.9 44.0  MCV 108.3*  --   PLT 340  --     Basic Metabolic Panel: Recent Labs  Lab 04/26/22 1038 04/26/22 1113 04/26/22 1837 04/26/22 2256 04/27/22 0328  NA 149* 148* 151* 151* 150*  K 5.6* 4.8 3.8 3.7 3.7  CL 107 113* 116* 116* 114*  CO2 12*  --  26 29 26   GLUCOSE 551* 561* 200* 151* 156*  BUN 55* 48* 42* 41* 39*  CREATININE 2.52* 2.10* 1.66* 1.69* 1.75*  CALCIUM 10.1  --  9.7 9.8 9.5    GFR: Estimated Creatinine Clearance: 53.8 mL/min (A) (by C-G formula based on SCr of 1.75 mg/dL (H)).   HbA1C: Recent Labs    04/26/22 1837  HGBA1C 10.5*    CBG: Recent Labs  Lab 04/27/22 0402 04/27/22 0516 04/27/22 0622 04/27/22 0731 04/27/22 0919  GLUCAP 107* 171* 155* 154* 148*     Recent Results (from the past 240 hour(s))  MRSA Next Gen by PCR, Nasal     Status: None   Collection Time: 04/26/22  6:03 PM   Specimen: Nasal Mucosa; Nasal Swab  Result Value Ref Range Status   MRSA by PCR Next Gen NOT DETECTED NOT DETECTED Final    Comment: (NOTE) The GeneXpert MRSA Assay (FDA approved for NASAL specimens only), is one component of a comprehensive MRSA colonization surveillance program. It is not intended to diagnose MRSA infection nor to guide or monitor treatment for MRSA infections. Test performance is not FDA approved in patients less than 8 years old. Performed at [facility], [place] [address], [place], [place] [address]       Radiology Studies: CT HEAD WO CONTRAST (5MM)  Result Date: 04/26/2022 CLINICAL DATA:  Altered mental status EXAM: CT HEAD WITHOUT CONTRAST TECHNIQUE: Contiguous axial images were obtained from the base of the skull through the vertex without intravenous contrast. RADIATION DOSE REDUCTION: This exam was performed according to the departmental dose-optimization program which  includes automated exposure control, adjustment of the mA and/or kV according to patient size and/or use of iterative reconstruction technique. COMPARISON:  02/21/2022 FINDINGS: Brain: No evidence of acute infarction, hemorrhage, hydrocephalus, extra-axial collection or mass lesion/mass effect. Vascular: No hyperdense vessel or unexpected calcification. Skull: Normal. Negative for fracture or focal lesion. Sinuses/Orbits: No acute finding. Other: None. IMPRESSION: No acute intracranial pathology. Electronically Signed   By: Delanna Ahmadi M.D.   On: 04/26/2022 15:02       LOS: 1 day   [facility] Pager on [email or web address]  04/27/2022, 9:35 AM

## 2022-04-28 ENCOUNTER — Other Ambulatory Visit: Payer: Self-pay

## 2022-04-28 DIAGNOSIS — E1165 Type 2 diabetes mellitus with hyperglycemia: Secondary | ICD-10-CM | POA: Diagnosis not present

## 2022-04-28 DIAGNOSIS — Z794 Long term (current) use of insulin: Secondary | ICD-10-CM | POA: Diagnosis not present

## 2022-04-28 LAB — BASIC METABOLIC PANEL
Anion gap: 7 (ref 5–15)
BUN: 27 mg/dL — ABNORMAL HIGH (ref 6–20)
CO2: 26 mmol/L (ref 22–32)
Calcium: 8.4 mg/dL — ABNORMAL LOW (ref 8.9–10.3)
Chloride: 106 mmol/L (ref 98–111)
Creatinine, Ser: 1.17 mg/dL (ref 0.61–1.24)
GFR, Estimated: >60 mL/min (ref >60)
Glucose, Bld: 199 mg/dL — ABNORMAL HIGH (ref 70–99)
Potassium: 3.1 mmol/L — ABNORMAL LOW (ref 3.5–5.1)
Sodium: 139 mmol/L (ref 135–145)

## 2022-04-28 LAB — GLUCOSE, CAPILLARY: Glucose-Capillary: 188 mg/dL — ABNORMAL HIGH (ref 70–99)

## 2022-04-28 MED ORDER — POTASSIUM CHLORIDE CRYS ER 20 MEQ PO TBCR
40.0000 meq | EXTENDED_RELEASE_TABLET | Freq: Once | ORAL | Status: AC
  Administered 2022-04-28: 40 meq via ORAL
  Filled 2022-04-28: qty 2

## 2022-04-28 MED ORDER — ATORVASTATIN CALCIUM 40 MG PO TABS
40.0000 mg | ORAL_TABLET | Freq: Every day | ORAL | 2 refills | Status: DC
  Filled 2022-04-28: qty 30, 30d supply, fill #0

## 2022-04-28 MED ORDER — POTASSIUM CHLORIDE CRYS ER 20 MEQ PO TBCR
40.0000 meq | EXTENDED_RELEASE_TABLET | ORAL | Status: DC
  Administered 2022-04-28: 40 meq via ORAL
  Filled 2022-04-28: qty 2

## 2022-04-28 MED ORDER — BASAGLAR KWIKPEN 100 UNIT/ML ~~LOC~~ SOPN
25.0000 [IU] | PEN_INJECTOR | Freq: Every day | SUBCUTANEOUS | 2 refills | Status: DC
  Filled 2022-04-28: qty 6, 24d supply, fill #0
  Filled 2022-05-15: qty 15, 60d supply, fill #0
  Filled 2022-05-15: qty 6, 24d supply, fill #0
  Filled 2022-05-28: qty 15, 60d supply, fill #0

## 2022-04-28 NOTE — Discharge Summary (Signed)
[facility]  Physician Discharge Summary   Patient ID: Stanley Edwards MRN: [id number] DOB/AGE: 1966/06/30 56 y.o.  Admit date: 04/26/2022 Discharge date: 04/28/2022    PCP: Charlott Rakes, MD  DISCHARGE DIAGNOSES:    DM2 (diabetes mellitus, type 2) ([place])   Hyperlipidemia   AKI (acute kidney injury) ([place])   Acute metabolic encephalopathy   DKA (diabetic ketoacidosis) ([place])   RECOMMENDATIONS FOR OUTPATIENT FOLLOW UP: Patient instructed to be compliant with his insulin regimen.   Home Health: None Equipment/Devices: None  CODE STATUS: Full code  DISCHARGE CONDITION: fair  Diet recommendation: Modified carbohydrate  INITIAL HISTORY: 56 y.o. male with medical history significant of DM2, HTN, HLD.  Presented with N/V and lethargy.  Patient's sister noted that he had been lethargic for a few days.  She was concerned about high glucose levels.  She called EMS and the patient was brought into the hospital.  He was found to be in DKA.  He was hospitalized.      HOSPITAL COURSE:   Diabetic ketoacidosis in the setting of diabetes mellitus type 2, uncontrolled with hyperglycemia Likely precipitated by noncompliance.  Was hospitalized in July for same.  Patient had high anion gap and elevated beta hydroxybutyric acid levels.  Glucose levels were also elevated.  Patient was started on insulin infusion and was hospitalized. HbA1c is 10.5 Patient was transitioned back to subcutaneous insulin after his anion gap closed and beta hydroxy butyrate acid levels improved.  Patient was counseled regarding compliance.   Acute kidney injury/hyperkalemia Presented with creatinine of 2.52.  Patient is back to baseline after hydration.  Potassium was initially high at presentation.  Noted to be low this morning.  Will be supplemented prior to discharge.  Acute metabolic encephalopathy Most likely due to DKA.  Appears to have resolved.  No focal neurological deficits.  CT head was  unremarkable.   Hyperlipidemia Continue statin.  Patient is stable.  Okay for discharge home today.   PERTINENT LABS:  The results of significant diagnostics from this hospitalization (including imaging, microbiology, ancillary and laboratory) are listed below for reference.    Microbiology: Recent Results (from the past 240 hour(s))  MRSA Next Gen by PCR, Nasal     Status: None   Collection Time: 04/26/22  6:03 PM   Specimen: Nasal Mucosa; Nasal Swab  Result Value Ref Range Status   MRSA by PCR Next Gen NOT DETECTED NOT DETECTED Final    Comment: (NOTE) The GeneXpert MRSA Assay (FDA approved for NASAL specimens only), is one component of a comprehensive MRSA colonization surveillance program. It is not intended to diagnose MRSA infection nor to guide or monitor treatment for MRSA infections. Test performance is not FDA approved in patients less than 59 years old. Performed at [facility], [place] [address], [place], [place] [address]      Labs:   Basic Metabolic Panel: Recent Labs  Lab 04/26/22 1038 04/26/22 1113 04/26/22 1837 04/26/22 2256 04/27/22 0328 04/28/22 0317  NA 149* 148* 151* 151* 150* 139  K 5.6* 4.8 3.8 3.7 3.7 3.1*  CL 107 113* 116* 116* 114* 106  CO2 12*  --  26 29 26 26   GLUCOSE 551* 561* 200* 151* 156* 199*  BUN 55* 48* 42* 41* 39* 27*  CREATININE 2.52* 2.10* 1.66* 1.69* 1.75* 1.17  CALCIUM 10.1  --  9.7 9.8 9.5 8.4*    CBC: Recent Labs  Lab 04/26/22 1038 04/26/22 1113  WBC 7.4  --   NEUTROABS  6.1  --   HGB 14.5 15.0  HCT 45.9 44.0  MCV 108.3*  --   PLT 340  --      CBG: Recent Labs  Lab 04/27/22 0919 04/27/22 1132 04/27/22 1604 04/27/22 2131 04/28/22 0725  GLUCAP 148* 132* 320* 200* 188*     IMAGING STUDIES CT HEAD WO CONTRAST (5MM)  Result Date: 04/26/2022 CLINICAL DATA:  Altered mental status EXAM: CT HEAD WITHOUT CONTRAST TECHNIQUE: Contiguous axial images were obtained from the base of the skull  through the vertex without intravenous contrast. RADIATION DOSE REDUCTION: This exam was performed according to the departmental dose-optimization program which includes automated exposure control, adjustment of the mA and/or kV according to patient size and/or use of iterative reconstruction technique. COMPARISON:  02/21/2022 FINDINGS: Brain: No evidence of acute infarction, hemorrhage, hydrocephalus, extra-axial collection or mass lesion/mass effect. Vascular: No hyperdense vessel or unexpected calcification. Skull: Normal. Negative for fracture or focal lesion. Sinuses/Orbits: No acute finding. Other: None. IMPRESSION: No acute intracranial pathology. Electronically Signed   By: Delanna Ahmadi M.D.   On: 04/26/2022 15:02    DISCHARGE EXAMINATION: Vitals:   04/27/22 1646 04/27/22 2134 04/28/22 0158 04/28/22 0624  BP: 109/70 110/70 107/65 114/75  Pulse: 92 94 88 82  Resp: 18 18 18 18   Temp: 98.5 F (36.9 C) 98.9 F (37.2 C) 98.5 F (36.9 C) (!) 97.5 F (36.4 C)  TempSrc: Oral Oral Oral Oral  SpO2: 100% 97% 98% 100%  Weight:      Height:       General appearance: Awake alert.  In no distress Resp: Clear to auscultation bilaterally.  Normal effort Cardio: S1-S2 is normal regular.  No S3-S4.  No rubs murmurs or bruit GI: Abdomen is soft.  Nontender nondistended.  Bowel sounds are present normal.  No masses organomegaly   DISPOSITION: Home  Discharge Instructions     Call MD for:  difficulty breathing, headache or visual disturbances   Complete by: As directed    Call MD for:  extreme fatigue   Complete by: As directed    Call MD for:  persistant dizziness or light-headedness   Complete by: As directed    Call MD for:  persistant nausea and vomiting   Complete by: As directed    Call MD for:  severe uncontrolled pain   Complete by: As directed    Call MD for:  temperature >100.4   Complete by: As directed    Diet Carb Modified   Complete by: As directed    Discharge instructions    Complete by: As directed    Please take your medications as prescribed.  Please be sure to follow-up with your primary care provider next week.  Please reach out to your PCP for a refill before your insulin runs out.  You were cared for by a hospitalist during your hospital stay. If you have any questions about your discharge medications or the care you received while you were in the hospital after you are discharged, you can call the unit and asked to speak with the hospitalist on call if the hospitalist that took care of you is not available. Once you are discharged, your primary care physician will handle any further medical issues. Please note that NO REFILLS for any discharge medications will be authorized once you are discharged, as it is imperative that you return to your primary care physician (or establish a relationship with a primary care physician if you do not have one) for  your aftercare needs so that they can reassess your need for medications and monitor your lab values. If you do not have a primary care physician, you can call [phone] for a physician referral.   Increase activity slowly   Complete by: As directed          Allergies as of 04/28/2022   No Known Allergies      Medication List     STOP taking these medications    folic acid 1 MG tablet Commonly known as: FOLVITE       TAKE these medications    atorvastatin 40 MG tablet Commonly known as: LIPITOR Take 1 tablet (40 mg total) by mouth daily.   Basaglar KwikPen 100 UNIT/ML Inject 25 Units into the skin daily.   NovoLOG FlexPen 100 UNIT/ML FlexPen Generic drug: insulin aspart Inject 10 Units into the skin 3 (three) times daily with meals.   Pentips 32G X 4 MM Misc Generic drug: Insulin Pen Needle Use to inject insulin 4 times daily.   True Metrix Blood Glucose Test test strip Generic drug: glucose blood Use 3 times daily before meals   True Metrix Meter Devi 1 each by Does not apply route  3 (three) times daily before meals.          Follow-up Information     Charlott Rakes, MD. Schedule an appointment as soon as possible for a visit in 1 week(s).   Specialty: Family Medicine Why: post hospitalization follow up Contact information: [address] [place] [address] [phone]                 TOTAL DISCHARGE TIME: 16 minutes  [place]  [facility] Pager on [email or web address]  04/29/2022, 12:06 PM

## 2022-04-28 NOTE — Inpatient Diabetes Management (Signed)
Inpatient Diabetes Program Recommendations  AACE/ADA: New Consensus Statement on Inpatient Glycemic Control (2015)  Target Ranges:  Prepandial:   less than 140 mg/dL      Peak postprandial:   less than 180 mg/dL (1-2 hours)      Critically ill patients:  140 - 180 mg/dL   Lab Results  Component Value Date   GLUCAP 188 (H) 04/28/2022   HGBA1C 10.5 (H) 04/26/2022    Review of Glycemic Control  Latest Reference Range & Units 04/27/22 21:31 04/28/22 07:25  Glucose-Capillary 70 - 99 mg/dL 200 (H) 188 (H)  (H): Data is abnormally high Diabetes history: Type 2 DM Outpatient Diabetes medications: Novolog 10 units TID, Basaglar 25 units QD Current orders for Inpatient glycemic control: Semglee 8 units QHS, 20 units QD, Novolog 0-15 units TID & HS  Inpatient Diabetes Program Recommendations:    Spoke with patient regarding outpatient diabetes management. Admits to missing doses; states he has enough insulin and "I know what I need to do, just have to do it." Reviewed patient's current A1c of 10.5%. Explained what a A1c is and what it measures. Also reviewed goal A1c with patient, importance of good glucose control @ home, and blood sugar goals. Reviewed patho of DKA, need for insulin, role of pancreas, survival skills, interventions, vascular changes and comorbidites.  Patient has a freestyle libre and plans to continue wearing. Has a meter as a back-up. Plans to make follow up appointment at [facility].  No further questions at this time.   Semglee increased to 25 units QD; in agreement.  Thanks, Bronson Curb, MSN, RNC-OB Diabetes Coordinator [phone] (8a-5p)

## 2022-04-28 NOTE — Progress Notes (Signed)
  Transition of Care [facility]) Screening Note   Patient Details  Name: Stanley Edwards Date of Birth: [birth year]   Transition of Care [facility]) CM/SW Contact:    Lennart Pall, LCSW Phone Number: 04/28/2022, 10:03 AM    Transition of Care Department [facility]) has reviewed patient and no TOC needs have been identified at this time. We will continue to monitor patient advancement through interdisciplinary progression rounds. If new patient transition needs arise, please place a TOC consult.

## 2022-04-29 ENCOUNTER — Telehealth: Payer: Self-pay

## 2022-04-29 NOTE — Telephone Encounter (Signed)
From the discharge call:  He stated he is feeling okay, glad to be out of the hospital   He said he has all medications except one that he needs to pick up at the pharmacy. He said he was using the freestyle but he said he needs a new one.  He has a standard glucometer that he can use  Scheduled to see Epimenio Sarin, PA- 05/01/2022/

## 2022-04-29 NOTE — Telephone Encounter (Signed)
Transition Care Management Follow-up Telephone Call Date of discharge and from where: 04/28/2022, [facility] How have you been since you were released from the hospital? He stated he is feeling okay, glad to be out of the hospital  Any questions or concerns? No  Items Reviewed: Did the pt receive and understand the discharge instructions provided? Yes  Medications obtained and verified?  He said he has all medications except one that he needs to pick up at the pharmacy. He said he was using the freestyle but he said he needs a new one.  He has a standard glucometer that he can use  Other? No  Any new allergies since your discharge? No  Dietary orders reviewed? No Do you have support at home? Yes   Home Care and Equipment/Supplies: Were home health services ordered? no If so, what is the name of the agency? N/a  Has the agency set up a time to come to the patient's home? not applicable Were any new equipment or medical supplies ordered?  No What is the name of the medical supply agency? N/a Were you able to get the supplies/equipment? not applicable Do you have any questions related to the use of the equipment or supplies? No  Functional Questionnaire: (I = Independent and D = Dependent) ADLs: independent   Follow up appointments reviewed:  PCP Hospital f/u appt confirmed? Yes  Scheduled to see Epimenio Sarin, PA- 05/01/2022/  Specialist Hospital f/u appt confirmed?  None scheduled at this time    Are transportation arrangements needed? No  If their condition worsens, is the pt aware to call PCP or go to the Emergency Dept.? Yes Was the patient provided with contact information for the PCP's office or ED? Yes Was to pt encouraged to call back with questions or concerns? Yes

## 2022-04-30 NOTE — Progress Notes (Deleted)
Patient ID: Stanley Edwards, male   DOB: 29-Nov-1965, 56 y.o.   MRN: [id number]  After hospitalization 9/30-10/08/2021    DISCHARGE DIAGNOSES:    DM2 (diabetes mellitus, type 2) ([place])   Hyperlipidemia   AKI (acute kidney injury) ([place])   Acute metabolic encephalopathy   DKA (diabetic ketoacidosis) ([place])  INITIAL HISTORY: 56 y.o. male with medical history significant of DM2, HTN, HLD.  Presented with N/V and lethargy.  Patient's sister noted that he had been lethargic for a few days.  She was concerned about high glucose levels.  She called EMS and the patient was brought into the hospital.  He was found to be in DKA.  He was hospitalized.    HOSPITAL COURSE:    Diabetic ketoacidosis in the setting of diabetes mellitus type 2, uncontrolled with hyperglycemia Likely precipitated by noncompliance.  Was hospitalized in July for same.  Patient had high anion gap and elevated beta hydroxybutyric acid levels.  Glucose levels were also elevated.  Patient was started on insulin infusion and was hospitalized. HbA1c is 10.5 Patient was transitioned back to subcutaneous insulin after his anion gap closed and beta hydroxy butyrate acid levels improved.  Patient was counseled regarding compliance.   Acute kidney injury/hyperkalemia Presented with creatinine of 2.52.  Patient is back to baseline after hydration.  Potassium was initially high at presentation.  Noted to be low this morning.  Will be supplemented prior to discharge.   Acute metabolic encephalopathy Most likely due to DKA.  Appears to have resolved.  No focal neurological deficits.  CT head was unremarkable.   Hyperlipidemia Continue statin.

## 2022-05-01 ENCOUNTER — Ambulatory Visit: Payer: Commercial Managed Care - HMO | Admitting: Physician Assistant

## 2022-05-01 DIAGNOSIS — E1165 Type 2 diabetes mellitus with hyperglycemia: Secondary | ICD-10-CM

## 2022-05-05 ENCOUNTER — Other Ambulatory Visit: Payer: Self-pay

## 2022-05-15 ENCOUNTER — Other Ambulatory Visit: Payer: Self-pay

## 2022-05-15 ENCOUNTER — Telehealth: Payer: Self-pay | Admitting: Family Medicine

## 2022-05-15 DIAGNOSIS — Z794 Long term (current) use of insulin: Secondary | ICD-10-CM

## 2022-05-15 NOTE — Telephone Encounter (Signed)
Unable to refill per protocol, Rx expired. Medication was discontinued 02/24/22. Will refuse request.  Requested Prescriptions  Pending Prescriptions Disp Refills  . insulin lispro (HUMALOG KWIKPEN) 100 UNIT/ML KwikPen 15 mL 0    Sig: Inject 0-12 Units into the skin 3 (three) times daily with meals. Per Sliding scale     Endocrinology:  Diabetes - Insulins Failed - 05/15/2022 11:22 AM      Failed - HBA1C is between 0 and 7.9 and within 180 days    HbA1c, POC (controlled diabetic range)  Date Value Ref Range Status  02/11/2021 6.2 0.0 - 7.0 % Final   Hgb A1c MFr Bld  Date Value Ref Range Status  04/26/2022 10.5 (H) 4.8 - 5.6 % Final    Comment:    (NOTE) Pre diabetes:          5.7%-6.4%  Diabetes:              >6.4%  Glycemic control for   <7.0% adults with diabetes          Passed - Valid encounter within last 6 months    Recent Outpatient Visits          5 months ago Type 2 diabetes mellitus with hyperglycemia, with long-term current use of insulin ([place])   [place], Charlane Ferretti, MD   5 months ago Screening for colon cancer   [place], Charlane Ferretti, MD   9 months ago Administrative encounter   [place], Charlane Ferretti, MD   10 months ago Type 2 diabetes mellitus without complication, with long-term current use of insulin [facility])   [facility] at [facility], Kriste Basque, NP   1 year ago Type 2 diabetes mellitus with hyperglycemia, with long-term current use of insulin [facility])   [place], [name], MD      Future Appointments            In 3 weeks [place], Dionne Bucy, PA-C [place]

## 2022-05-16 ENCOUNTER — Other Ambulatory Visit: Payer: Self-pay

## 2022-05-19 ENCOUNTER — Other Ambulatory Visit: Payer: Self-pay

## 2022-05-19 MED ORDER — NOVOLOG FLEXPEN 100 UNIT/ML ~~LOC~~ SOPN
10.0000 [IU] | PEN_INJECTOR | Freq: Three times a day (TID) | SUBCUTANEOUS | 0 refills | Status: DC
  Filled 2022-05-19 – 2022-05-28 (×2): qty 9, 30d supply, fill #0

## 2022-05-19 NOTE — Addendum Note (Signed)
Addended by: Daisy Blossom, Annie Main L on: 05/19/2022 12:21 PM   Modules accepted: Orders

## 2022-05-19 NOTE — Telephone Encounter (Signed)
Pt is calling to follow up on his medication refill.  Pt stated he is completely out of medication.   Please advise.

## 2022-05-19 NOTE — Telephone Encounter (Signed)
Novolog is on the patient's profile. Sending for Novolog.

## 2022-05-21 ENCOUNTER — Other Ambulatory Visit: Payer: Self-pay

## 2022-05-22 ENCOUNTER — Other Ambulatory Visit: Payer: Self-pay

## 2022-05-28 ENCOUNTER — Other Ambulatory Visit: Payer: Self-pay

## 2022-05-29 ENCOUNTER — Other Ambulatory Visit: Payer: Self-pay

## 2022-06-04 NOTE — Progress Notes (Deleted)
Patient ID: Stanley Edwards, male   DOB: 03-Jan-1966, 56 y.o.   MRN: [id number]    Admit date: 04/26/2022 Discharge date: 04/28/2022     PCP: Hoy Register, MD   DISCHARGE DIAGNOSES:    DM2 (diabetes mellitus, type 2) ([facility])   Hyperlipidemia   AKI (acute kidney injury) ([facility])   Acute metabolic encephalopathy   DKA (diabetic ketoacidosis) ([facility])  INITIAL HISTORY: 56 y.o. male with medical history significant of DM2, HTN, HLD.  Presented with N/V and lethargy.  Patient's sister noted that he had been lethargic for a few days.  She was concerned about high glucose levels.  She called EMS and the patient was brought into the hospital.  He was found to be in DKA.  He was hospitalized.         HOSPITAL COURSE:    Diabetic ketoacidosis in the setting of diabetes mellitus type 2, uncontrolled with hyperglycemia Likely precipitated by noncompliance.  Was hospitalized in July for same.  Patient had high anion gap and elevated beta hydroxybutyric acid levels.  Glucose levels were also elevated.  Patient was started on insulin infusion and was hospitalized. HbA1c is 10.5 Patient was transitioned back to subcutaneous insulin after his anion gap closed and beta hydroxy butyrate acid levels improved.  Patient was counseled regarding compliance.   Acute kidney injury/hyperkalemia Presented with creatinine of 2.52.  Patient is back to baseline after hydration.  Potassium was initially high at presentation.  Noted to be low this morning.  Will be supplemented prior to discharge.   Acute metabolic encephalopathy Most likely due to DKA.  Appears to have resolved.  No focal neurological deficits.  CT head was unremarkable.   Hyperlipidemia Continue statin.

## 2022-06-05 ENCOUNTER — Ambulatory Visit: Payer: Self-pay | Admitting: Physician Assistant

## 2022-06-05 DIAGNOSIS — E1165 Type 2 diabetes mellitus with hyperglycemia: Secondary | ICD-10-CM

## 2022-06-25 ENCOUNTER — Inpatient Hospital Stay (HOSPITAL_COMMUNITY)
Admission: EM | Admit: 2022-06-25 | Discharge: 2022-06-27 | DRG: 637 | Disposition: A | Discharge status: Home / Self Care | Payer: Commercial Managed Care - HMO | Attending: Internal Medicine | Admitting: Internal Medicine

## 2022-06-25 ENCOUNTER — Emergency Department (HOSPITAL_COMMUNITY): Payer: Commercial Managed Care - HMO

## 2022-06-25 ENCOUNTER — Encounter (HOSPITAL_COMMUNITY): Payer: Self-pay

## 2022-06-25 DIAGNOSIS — E876 Hypokalemia: Secondary | ICD-10-CM | POA: Diagnosis not present

## 2022-06-25 DIAGNOSIS — W19XXXA Unspecified fall, initial encounter: Secondary | ICD-10-CM | POA: Diagnosis present

## 2022-06-25 DIAGNOSIS — Z91199 Patient's noncompliance with other medical treatment and regimen due to unspecified reason: Secondary | ICD-10-CM

## 2022-06-25 DIAGNOSIS — R000 Tachycardia, unspecified: Secondary | ICD-10-CM | POA: Diagnosis present

## 2022-06-25 DIAGNOSIS — E785 Hyperlipidemia, unspecified: Secondary | ICD-10-CM | POA: Diagnosis present

## 2022-06-25 DIAGNOSIS — E1165 Type 2 diabetes mellitus with hyperglycemia: Secondary | ICD-10-CM

## 2022-06-25 DIAGNOSIS — G9341 Metabolic encephalopathy: Secondary | ICD-10-CM | POA: Diagnosis present

## 2022-06-25 DIAGNOSIS — E1110 Type 2 diabetes mellitus with ketoacidosis without coma: Secondary | ICD-10-CM | POA: Diagnosis present

## 2022-06-25 DIAGNOSIS — E870 Hyperosmolality and hypernatremia: Secondary | ICD-10-CM | POA: Diagnosis present

## 2022-06-25 DIAGNOSIS — E1169 Type 2 diabetes mellitus with other specified complication: Secondary | ICD-10-CM | POA: Diagnosis present

## 2022-06-25 DIAGNOSIS — N179 Acute kidney failure, unspecified: Secondary | ICD-10-CM | POA: Diagnosis present

## 2022-06-25 DIAGNOSIS — Z79899 Other long term (current) drug therapy: Secondary | ICD-10-CM

## 2022-06-25 DIAGNOSIS — I10 Essential (primary) hypertension: Secondary | ICD-10-CM | POA: Diagnosis present

## 2022-06-25 DIAGNOSIS — E860 Dehydration: Secondary | ICD-10-CM | POA: Diagnosis present

## 2022-06-25 DIAGNOSIS — E1010 Type 1 diabetes mellitus with ketoacidosis without coma: Principal | ICD-10-CM

## 2022-06-25 DIAGNOSIS — E119 Type 2 diabetes mellitus without complications: Secondary | ICD-10-CM

## 2022-06-25 DIAGNOSIS — Z833 Family history of diabetes mellitus: Secondary | ICD-10-CM

## 2022-06-25 DIAGNOSIS — D72829 Elevated white blood cell count, unspecified: Secondary | ICD-10-CM | POA: Diagnosis present

## 2022-06-25 DIAGNOSIS — Z794 Long term (current) use of insulin: Secondary | ICD-10-CM

## 2022-06-25 DIAGNOSIS — Z91148 Patient's other noncompliance with medication regimen for other reason: Secondary | ICD-10-CM

## 2022-06-25 LAB — CBG MONITORING, ED
Glucose-Capillary: 551 mg/dL (ref 70–99)
Glucose-Capillary: 551 mg/dL (ref 70–99)
Glucose-Capillary: 483 mg/dL — ABNORMAL HIGH (ref 70–99)
Glucose-Capillary: 466 mg/dL — ABNORMAL HIGH (ref 70–99)
Glucose-Capillary: 376 mg/dL — ABNORMAL HIGH (ref 70–99)
Glucose-Capillary: 319 mg/dL — ABNORMAL HIGH (ref 70–99)
Glucose-Capillary: 297 mg/dL — ABNORMAL HIGH (ref 70–99)
Glucose-Capillary: 255 mg/dL — ABNORMAL HIGH (ref 70–99)
Glucose-Capillary: 260 mg/dL — ABNORMAL HIGH (ref 70–99)
Glucose-Capillary: 126 mg/dL — ABNORMAL HIGH (ref 70–99)
Glucose-Capillary: 125 mg/dL — ABNORMAL HIGH (ref 70–99)
Glucose-Capillary: 212 mg/dL — ABNORMAL HIGH (ref 70–99)

## 2022-06-25 LAB — CBC WITH DIFFERENTIAL/PLATELET
Abs Immature Granulocytes: 0.03 10*3/uL (ref 0.00–0.07)
Basophils Absolute: 0 10*3/uL (ref 0.0–0.1)
Basophils Relative: 0 %
Eosinophils Absolute: 0 10*3/uL (ref 0.0–0.5)
Eosinophils Relative: 0 %
HCT: 49.6 % (ref 39.0–52.0)
Hemoglobin: 15.8 g/dL (ref 13.0–17.0)
Immature Granulocytes: 0 %
Lymphocytes Relative: 9 %
Lymphs Abs: 1 10*3/uL (ref 0.7–4.0)
MCH: 35 pg — ABNORMAL HIGH (ref 26.0–34.0)
MCHC: 31.9 g/dL (ref 30.0–36.0)
MCV: 109.7 fL — ABNORMAL HIGH (ref 80.0–100.0)
Monocytes Absolute: 0.4 10*3/uL (ref 0.1–1.0)
Monocytes Relative: 4 %
Neutro Abs: 9.7 10*3/uL — ABNORMAL HIGH (ref 1.7–7.7)
Neutrophils Relative %: 87 %
Platelets: 311 10*3/uL (ref 150–400)
RBC: 4.52 MIL/uL (ref 4.22–5.81)
RDW: 11 % — ABNORMAL LOW (ref 11.5–15.5)
WBC: 11.2 10*3/uL — ABNORMAL HIGH (ref 4.0–10.5)
nRBC: 0 % (ref 0.0–0.2)

## 2022-06-25 LAB — GLUCOSE, CAPILLARY: Glucose-Capillary: 130 mg/dL — ABNORMAL HIGH (ref 70–99)

## 2022-06-25 LAB — BASIC METABOLIC PANEL
Anion gap: 21 — ABNORMAL HIGH (ref 5–15)
Anion gap: 15 (ref 5–15)
BUN: 48 mg/dL — ABNORMAL HIGH (ref 6–20)
BUN: 40 mg/dL — ABNORMAL HIGH (ref 6–20)
CO2: 14 mmol/L — ABNORMAL LOW (ref 22–32)
CO2: 24 mmol/L (ref 22–32)
Calcium: 8.9 mg/dL (ref 8.9–10.3)
Calcium: 9.9 mg/dL (ref 8.9–10.3)
Chloride: 113 mmol/L — ABNORMAL HIGH (ref 98–111)
Chloride: 120 mmol/L — ABNORMAL HIGH (ref 98–111)
Creatinine, Ser: 2.96 mg/dL — ABNORMAL HIGH (ref 0.61–1.24)
Creatinine, Ser: 2.15 mg/dL — ABNORMAL HIGH (ref 0.61–1.24)
GFR, Estimated: 24 mL/min — ABNORMAL LOW (ref >60)
GFR, Estimated: 35 mL/min — ABNORMAL LOW (ref >60)
Glucose, Bld: 404 mg/dL — ABNORMAL HIGH (ref 70–99)
Glucose, Bld: 142 mg/dL — ABNORMAL HIGH (ref 70–99)
Potassium: 3.7 mmol/L (ref 3.5–5.1)
Potassium: 3.7 mmol/L (ref 3.5–5.1)
Sodium: 148 mmol/L — ABNORMAL HIGH (ref 135–145)
Sodium: 159 mmol/L — ABNORMAL HIGH (ref 135–145)

## 2022-06-25 LAB — I-STAT VENOUS BLOOD GAS, ED
Acid-base deficit: 16 mmol/L — ABNORMAL HIGH (ref 0.0–2.0)
Bicarbonate: 11.2 mmol/L — ABNORMAL LOW (ref 20.0–28.0)
Calcium, Ion: 1.19 mmol/L (ref 1.15–1.40)
HCT: 48 % (ref 39.0–52.0)
Hemoglobin: 16.3 g/dL (ref 13.0–17.0)
O2 Saturation: 65 %
Potassium: 5.3 mmol/L — ABNORMAL HIGH (ref 3.5–5.1)
Sodium: 148 mmol/L — ABNORMAL HIGH (ref 135–145)
TCO2: 12 mmol/L — ABNORMAL LOW (ref 22–32)
pCO2, Ven: 30.9 mmHg — ABNORMAL LOW (ref 44–60)
pH, Ven: 7.168 — CL (ref 7.25–7.43)
pO2, Ven: 42 mmHg (ref 32–45)

## 2022-06-25 LAB — CBC
HCT: 40.3 % (ref 39.0–52.0)
Hemoglobin: 13.2 g/dL (ref 13.0–17.0)
MCH: 34.8 pg — ABNORMAL HIGH (ref 26.0–34.0)
MCHC: 32.8 g/dL (ref 30.0–36.0)
MCV: 106.3 fL — ABNORMAL HIGH (ref 80.0–100.0)
Platelets: 285 10*3/uL (ref 150–400)
RBC: 3.79 MIL/uL — ABNORMAL LOW (ref 4.22–5.81)
RDW: 11.2 % — ABNORMAL LOW (ref 11.5–15.5)
WBC: 11.1 10*3/uL — ABNORMAL HIGH (ref 4.0–10.5)
nRBC: 0 % (ref 0.0–0.2)

## 2022-06-25 LAB — COMPREHENSIVE METABOLIC PANEL
ALT: 15 U/L (ref 0–44)
AST: 13 U/L — ABNORMAL LOW (ref 15–41)
Albumin: 4.3 g/dL (ref 3.5–5.0)
Alkaline Phosphatase: 99 U/L (ref 38–126)
Anion gap: 36 — ABNORMAL HIGH (ref 5–15)
BUN: 54 mg/dL — ABNORMAL HIGH (ref 6–20)
CO2: 8 mmol/L — ABNORMAL LOW (ref 22–32)
Calcium: 10.3 mg/dL (ref 8.9–10.3)
Chloride: 105 mmol/L (ref 98–111)
Creatinine, Ser: 3.21 mg/dL — ABNORMAL HIGH (ref 0.61–1.24)
GFR, Estimated: 22 mL/min — ABNORMAL LOW (ref >60)
Glucose, Bld: 616 mg/dL (ref 70–99)
Potassium: 5.5 mmol/L — ABNORMAL HIGH (ref 3.5–5.1)
Sodium: 149 mmol/L — ABNORMAL HIGH (ref 135–145)
Total Bilirubin: 2.4 mg/dL — ABNORMAL HIGH (ref 0.3–1.2)
Total Protein: 8.4 g/dL — ABNORMAL HIGH (ref 6.5–8.1)

## 2022-06-25 LAB — URINALYSIS, ROUTINE W REFLEX MICROSCOPIC
Bacteria, UA: NONE SEEN
Bilirubin Urine: NEGATIVE
Glucose, UA: >=500 mg/dL — AB
Hgb urine dipstick: NEGATIVE
Ketones, ur: 80 mg/dL — AB
Leukocytes,Ua: NEGATIVE
Nitrite: NEGATIVE
Protein, ur: NEGATIVE mg/dL
Specific Gravity, Urine: 1.024 (ref 1.005–1.030)
pH: 5 (ref 5.0–8.0)

## 2022-06-25 LAB — I-STAT CHEM 8, ED
BUN: 51 mg/dL — ABNORMAL HIGH (ref 6–20)
Calcium, Ion: 1.21 mmol/L (ref 1.15–1.40)
Chloride: 116 mmol/L — ABNORMAL HIGH (ref 98–111)
Creatinine, Ser: 2.4 mg/dL — ABNORMAL HIGH (ref 0.61–1.24)
Glucose, Bld: 633 mg/dL (ref 70–99)
HCT: 49 % (ref 39.0–52.0)
Hemoglobin: 16.7 g/dL (ref 13.0–17.0)
Potassium: 5.3 mmol/L — ABNORMAL HIGH (ref 3.5–5.1)
Sodium: 149 mmol/L — ABNORMAL HIGH (ref 135–145)
TCO2: 12 mmol/L — ABNORMAL LOW (ref 22–32)

## 2022-06-25 LAB — BETA-HYDROXYBUTYRIC ACID
Beta-Hydroxybutyric Acid: >8 mmol/L — ABNORMAL HIGH (ref 0.05–0.27)
Beta-Hydroxybutyric Acid: 2.31 mmol/L — ABNORMAL HIGH (ref 0.05–0.27)

## 2022-06-25 MED ORDER — INSULIN REGULAR(HUMAN) IN NACL 100-0.9 UT/100ML-% IV SOLN
INTRAVENOUS | Status: DC
  Administered 2022-06-25: 1 [IU]/h via INTRAVENOUS
  Administered 2022-06-25: 11 [IU]/h via INTRAVENOUS
  Filled 2022-06-25: qty 100

## 2022-06-25 MED ORDER — ENOXAPARIN SODIUM 30 MG/0.3ML IJ SOSY
30.0000 mg | PREFILLED_SYRINGE | INTRAMUSCULAR | Status: DC
  Administered 2022-06-25 – 2022-06-26 (×2): 30 mg via SUBCUTANEOUS
  Filled 2022-06-25 (×2): qty 0.3

## 2022-06-25 MED ORDER — ACETAMINOPHEN 650 MG RE SUPP: 650.0000 mg | Freq: Four times a day (QID) | RECTAL | Status: DC | PRN

## 2022-06-25 MED ORDER — INSULIN ASPART 100 UNIT/ML IJ SOLN
4.0000 [IU] | Freq: Three times a day (TID) | INTRAMUSCULAR | Status: DC
  Administered 2022-06-26 (×3): 4 [IU] via SUBCUTANEOUS

## 2022-06-25 MED ORDER — INSULIN REGULAR(HUMAN) IN NACL 100-0.9 UT/100ML-% IV SOLN
INTRAVENOUS | Status: DC
  Administered 2022-06-25: 10.5 [IU]/h via INTRAVENOUS
  Filled 2022-06-25: qty 100

## 2022-06-25 MED ORDER — SODIUM CHLORIDE 0.9 % IV BOLUS
2000.0000 mL | Freq: Once | INTRAVENOUS | Status: AC
  Administered 2022-06-25: 2000 mL via INTRAVENOUS

## 2022-06-25 MED ORDER — LACTATED RINGERS IV SOLN: INTRAVENOUS | Status: DC

## 2022-06-25 MED ORDER — INSULIN GLARGINE-YFGN 100 UNIT/ML ~~LOC~~ SOLN
20.0000 [IU] | Freq: Every day | SUBCUTANEOUS | Status: DC
  Administered 2022-06-25 – 2022-06-27 (×3): 20 [IU] via SUBCUTANEOUS
  Filled 2022-06-25 (×3): qty 0.2

## 2022-06-25 MED ORDER — LACTATED RINGERS IV BOLUS
20.0000 mL/kg | Freq: Once | INTRAVENOUS | Status: AC
  Administered 2022-06-25: 1632 mL via INTRAVENOUS

## 2022-06-25 MED ORDER — SODIUM CHLORIDE 0.45 % IV SOLN
INTRAVENOUS | Status: DC
  Administered 2022-06-26: 125 mL/h via INTRAVENOUS

## 2022-06-25 MED ORDER — INSULIN ASPART 100 UNIT/ML IJ SOLN
0.0000 [IU] | Freq: Three times a day (TID) | INTRAMUSCULAR | Status: DC
  Administered 2022-06-25: 5 [IU] via SUBCUTANEOUS
  Administered 2022-06-26 (×2): 3 [IU] via SUBCUTANEOUS
  Administered 2022-06-27: 5 [IU] via SUBCUTANEOUS

## 2022-06-25 MED ORDER — DEXTROSE 50 % IV SOLN: 0.0000 mL | INTRAVENOUS | Status: DC | PRN

## 2022-06-25 MED ORDER — ACETAMINOPHEN 325 MG PO TABS: 650.0000 mg | ORAL_TABLET | Freq: Four times a day (QID) | ORAL | Status: DC | PRN

## 2022-06-25 MED ORDER — DEXTROSE IN LACTATED RINGERS 5 % IV SOLN: INTRAVENOUS | Status: DC

## 2022-06-25 MED ORDER — INSULIN ASPART 100 UNIT/ML IJ SOLN: 0.0000 [IU] | Freq: Every day | INTRAMUSCULAR | Status: DC

## 2022-06-25 NOTE — Progress Notes (Signed)
PROGRESS NOTE    Stanley Edwards  KXF:[id number] DOB: 1965/08/05 DOA: 06/25/2022 PCP: Charlott Rakes, MD   Brief Narrative: 56 year old past medical history significant for poorly controlled diabetes type 2, medical noncompliance, hypertension, hyperlipidemia presented via EMS after a fall.  He was found to be tachycardic, in DKA with a pH of 7.1 beta butyric acid more than 8, UA with more than 80 ketones.  CT head CT spine negative for acute finding.  Patient was started on insulin drip and IV fluids.     Assessment & Plan:   Principal Problem:   DKA (diabetic ketoacidosis) ([place]) Active Problems:   DM2 (diabetes mellitus, type 2) ([place])   Hyperlipidemia   AKI (acute kidney injury) ([place])   Hypertension   Acute metabolic encephalopathy   Hypernatremia   1-DKA: Severe in the setting of poorly controlled diabetes and noncompliance with medication. He was started on IV fluids and insulin drip.  Class B med gap 15, bicarb 24 resolved. Transition to Semglee 20 units and sliding scale and 4 units meal coverage Plan  to repeat be met this afternoon to follow.  2-Hypernatremia In the setting of the KA and dehydration. IV fluids will be changed to half-normal saline.  Continue to monitor be managed  3-AKI: Creatinine baseline 1.1, creatinine peaked to 3.2.  Continue with IV fluids. In the setting of DKA  4-Acute metabolic encephalopathy likely related to DKA.  CT head negative.  5-Mild leukocytosis: Monitor hyperlipidemia: Not taking Lipitor.      Estimated body mass index is 23.1 kg/m as calculated from the following:   Height as of this encounter: _0  (1.88 m).   Weight as of this encounter: 81.6 kg.   DVT prophylaxis: Lovenox Code Status: Full code Family Communication: care discussed with patient.  Disposition Plan:  Status is: Observation The patient remains OBS appropriate and will d/c before 2 midnights.    Consultants:  none  Procedures:     Antimicrobials:    Subjective: He is alert, not answering questions, he did told me he could eat   Objective: Vitals:   06/25/22 1130 06/25/22 1300 06/25/22 1521 06/25/22 1600  BP: 120/72 116/74    Pulse: (!) 119 (!) 114  (!) 107  Resp: (!) 0 17  17  Temp:   98.6 F (37 C)   TempSrc:   Oral   SpO2: 99% 96%  98%  Weight:      Height:       No intake or output data in the 24 hours ending 06/25/22 1726 Filed Weights   06/25/22 0213  Weight: 81.6 kg    Examination:  General exam: Appears calm and comfortable  Respiratory system: Clear to auscultation. Respiratory effort normal. Cardiovascular system: S1 & S2 heard, RRR. No JVD, murmurs, rubs, gallops or clicks. No pedal edema. Gastrointestinal system: Abdomen is nondistended, soft and nontender. No organomegaly or masses felt. Normal bowel sounds heard. Central nervous system: Alert Extremities: Symmetric 5 x 5 power.    Data Reviewed: I have personally reviewed following labs and imaging studies  CBC: Recent Labs  Lab 06/25/22 0228 06/25/22 0229 06/25/22 0630  WBC  --  11.2* 11.1*  NEUTROABS  --  9.7*  --   HGB 16.3 15.8  16.7 13.2  HCT 48.0 49.6  49.0 40.3  MCV  --  109.7* 106.3*  PLT  --  311 696   Basic Metabolic Panel: Recent Labs  Lab 06/25/22 0228 06/25/22 0229 06/25/22 0630 06/25/22  1223  NA 148* 149*  149* 148* 159*  K 5.3* 5.5*  5.3* 3.7 3.7  CL  --  105  116* 113* 120*  CO2  --  8* 14* 24  GLUCOSE  --  616*  633* 404* 142*  BUN  --  54*  51* 48* 40*  CREATININE  --  3.21*  2.40* 2.96* 2.15*  CALCIUM  --  10.3 8.9 9.9   GFR: Estimated Creatinine Clearance: 43.8 mL/min (A) (by C-G formula based on SCr of 2.15 mg/dL (H)). Liver Function Tests: Recent Labs  Lab 06/25/22 0229  AST 13*  ALT 15  ALKPHOS 99  BILITOT 2.4*  PROT 8.4*  ALBUMIN 4.3   No results for input(s): "LIPASE", "AMYLASE" in the last 168 hours. No results for input(s): "AMMONIA" in the last 168  hours. Coagulation Profile: No results for input(s): "INR", "PROTIME" in the last 168 hours. Cardiac Enzymes: No results for input(s): "CKTOTAL", "CKMB", "CKMBINDEX", "TROPONINI" in the last 168 hours. BNP (last 3 results) No results for input(s): "PROBNP" in the last 8760 hours. HbA1C: No results for input(s): "HGBA1C" in the last 72 hours. CBG: Recent Labs  Lab 06/25/22 0941 06/25/22 1054 06/25/22 1216 06/25/22 1311 06/25/22 1714  GLUCAP 255* 260* 126* 125* 212*   Lipid Profile: No results for input(s): "CHOL", "HDL", "LDLCALC", "TRIG", "CHOLHDL", "LDLDIRECT" in the last 72 hours. Thyroid Function Tests: No results for input(s): "TSH", "T4TOTAL", "FREET4", "T3FREE", "THYROIDAB" in the last 72 hours. Anemia Panel: No results for input(s): "VITAMINB12", "FOLATE", "FERRITIN", "TIBC", "IRON", "RETICCTPCT" in the last 72 hours. Sepsis Labs: No results for input(s): "PROCALCITON", "LATICACIDVEN" in the last 168 hours.  No results found for this or any previous visit (from the past 240 hour(s)).       Radiology Studies: CT Head Wo Contrast  Result Date: 06/25/2022 CLINICAL DATA:  Fall down stairs EXAM: CT HEAD WITHOUT CONTRAST CT CERVICAL SPINE WITHOUT CONTRAST TECHNIQUE: Multidetector CT imaging of the head and cervical spine was performed following the standard protocol without intravenous contrast. Multiplanar CT image reconstructions of the cervical spine were also generated. RADIATION DOSE REDUCTION: This exam was performed according to the departmental dose-optimization program which includes automated exposure control, adjustment of the mA and/or kV according to patient size and/or use of iterative reconstruction technique. COMPARISON:  11/02/2021 FINDINGS: CT HEAD FINDINGS Brain: No evidence of acute infarction, hemorrhage, hydrocephalus, extra-axial collection or mass lesion/mass effect. Vascular: No hyperdense vessel or unexpected calcification. Skull: Negative for fracture  Sinuses/Orbits: No evidence of injury CT CERVICAL SPINE FINDINGS Alignment: Normal. Skull base and vertebrae: No acute fracture. No primary bone lesion or focal pathologic process. Soft tissues and spinal canal: No prevertebral fluid or swelling. No visible canal hematoma. Disc levels:  Lower cervical disc degeneration. Upper chest: No visible injury IMPRESSION: No evidence of acute intracranial or cervical spine injury. Electronically Signed   By: Jorje Guild M.D.   On: 06/25/2022 05:31   CT Cervical Spine Wo Contrast  Result Date: 06/25/2022 CLINICAL DATA:  Fall down stairs EXAM: CT HEAD WITHOUT CONTRAST CT CERVICAL SPINE WITHOUT CONTRAST TECHNIQUE: Multidetector CT imaging of the head and cervical spine was performed following the standard protocol without intravenous contrast. Multiplanar CT image reconstructions of the cervical spine were also generated. RADIATION DOSE REDUCTION: This exam was performed according to the departmental dose-optimization program which includes automated exposure control, adjustment of the mA and/or kV according to patient size and/or use of iterative reconstruction technique. COMPARISON:  11/02/2021 FINDINGS: CT  HEAD FINDINGS Brain: No evidence of acute infarction, hemorrhage, hydrocephalus, extra-axial collection or mass lesion/mass effect. Vascular: No hyperdense vessel or unexpected calcification. Skull: Negative for fracture Sinuses/Orbits: No evidence of injury CT CERVICAL SPINE FINDINGS Alignment: Normal. Skull base and vertebrae: No acute fracture. No primary bone lesion or focal pathologic process. Soft tissues and spinal canal: No prevertebral fluid or swelling. No visible canal hematoma. Disc levels:  Lower cervical disc degeneration. Upper chest: No visible injury IMPRESSION: No evidence of acute intracranial or cervical spine injury. Electronically Signed   By: Jorje Guild M.D.   On: 06/25/2022 05:31        Scheduled Meds:  enoxaparin (LOVENOX)  injection  30 mg Subcutaneous Q24H   insulin aspart  0-15 Units Subcutaneous TID WC   insulin aspart  0-5 Units Subcutaneous QHS   [START ON 06/26/2022] insulin aspart  4 Units Subcutaneous TID WC   insulin glargine-yfgn  20 Units Subcutaneous Daily   Continuous Infusions:  sodium chloride     dextrose 5% lactated ringers 150 mL/hr at 06/25/22 1233   insulin 1 Units/hr (06/25/22 1235)   lactated ringers 125 mL/hr at 06/25/22 1027     LOS: 0 days    Time spent: 35 minutes.     Elmarie Shiley, MD [facility]   If 7PM-7AM, please contact night-coverage [email or web address]  06/25/2022, 5:26 PM

## 2022-06-25 NOTE — ED Triage Notes (Signed)
BIB [facility] from home,unwitnessed fall down 7 stairs inside house. Family found patient lying on floor. GCS 10 with EMS. Presents to ED in [place]. Patient is GCS 14 and following commands upon arrival to ED.  Per family patient has not been compliant with insulin.   CBG=478 98/62 130 HR  EKG ST

## 2022-06-25 NOTE — H&P (Addendum)
History and Physical    Stanley Edwards QMG:[id number] DOB: 09/04/1965 DOA: 06/25/2022  PCP: Hoy Register, MD  Patient coming from: Home  Chief Complaint: Fall  HPI: Stanley Edwards is a 55 y.o. male with medical history significant of poorly controlled insulin-dependent type 2 diabetes, medical noncompliance, hypertension, hyperlipidemia presented to the ED via EMS after a fall.  Tachycardia to the 130s on arrival, remainder of vital signs stable.  Labs showing WBC 11.2, sodium 149, potassium 5.5, glucose 616, bicarb 8, anion gap 36, BUN 54, creatinine 3.2 (baseline 1.1), T. bili 2.4 (elevated on previous labs as well) and normal remainder of LFTs, VBG showing pH 7.16, beta hydroxybutyric acid >8.0, UA with 80 ketones but no signs of infection.  Trauma scans including CT head and CT C-spine negative for acute finding. Patient was given IV fluid boluses and started on insulin infusion.  [facility] called to admit for DKA management.  Patient is confused and not able to give any history.  Replying to all questions by repeatedly saying "ketoacidosis."  He did tell me he is thirsty and wants water.  Review of Systems:  Review of Systems  All other systems reviewed and are negative.   Past Medical History:  Diagnosis Date   Allergy    Diabetes mellitus without complication (HCC)    Hypertension    Pt denies htn but it was on his previous hx    History reviewed. No pertinent surgical history.   reports that he has quit smoking. His smoking use included cigarettes. He has never used smokeless tobacco. He reports current alcohol use of about 1.0 standard drink of alcohol per week. He reports that he does not use drugs.  No Known Allergies  Family History  Problem Relation Age of Onset   Cancer Mother    Diabetes Father     Prior to Admission medications   Medication Sig Start Date End Date Taking? Authorizing Provider  atorvastatin (LIPITOR) 40 MG tablet Take 1 tablet (40 mg total) by  mouth daily. Patient not taking: Reported on 06/25/2022 04/28/22   Osvaldo Shipper, MD  Blood Glucose Monitoring Suppl (TRUE METRIX METER) DEVI 1 each by Does not apply route 3 (three) times daily before meals. 05/30/20   Hoy Register, MD  glucose blood (TRUE METRIX BLOOD GLUCOSE TEST) test strip Use 3 times daily before meals 05/30/20   Newlin, [name], MD  insulin aspart (NOVOLOG FLEXPEN) 100 UNIT/ML FlexPen Inject 10 Units into the skin 3 (three) times daily with meals. Patient not taking: Reported on 06/25/2022 05/19/22   Hoy Register, MD  Insulin Glargine [facility] KWIKPEN) 100 UNIT/ML Inject 25 Units into the skin daily. Patient not taking: Reported on 06/25/2022 04/28/22   Osvaldo Shipper, MD  Insulin Pen Needle 32G X 4 MM MISC Use to inject insulin 4 times daily. 11/07/21   Drema Dallas, MD    Physical Exam: Vitals:   06/25/22 0415 06/25/22 0430 06/25/22 0445 06/25/22 0500  BP: 125/67 123/69 127/79 127/79  Pulse: (!) 119 (!) 120 (!) 121 (!) 118  Resp: 14 11 12 11   Temp:      TempSrc:      SpO2: 100% 100% 100% 100%  Weight:      Height:        Physical Exam Vitals reviewed.  Constitutional:      General: He is not in acute distress. HENT:     Head: Normocephalic and atraumatic.     Mouth/Throat:     Comments:  Very dry mucous membranes Cardiovascular:     Rate and Rhythm: Regular rhythm. Tachycardia present.     Pulses: Normal pulses.     Comments: Heart rate in the 110s Pulmonary:     Effort: Pulmonary effort is normal. No respiratory distress.     Breath sounds: Normal breath sounds. No wheezing or rales.  Abdominal:     General: Bowel sounds are normal. There is no distension.     Palpations: Abdomen is soft.     Tenderness: There is no abdominal tenderness.  Musculoskeletal:     Cervical back: Normal range of motion.     Right lower leg: No edema.     Left lower leg: No edema.  Skin:    General: Skin is warm and dry.  Neurological:     General: No focal  deficit present.     Mental Status: He is alert.     Comments: Oriented to person and place.  Thinks the year is 2019. Confused but able to follow commands appropriately     Labs on Admission: I have personally reviewed following labs and imaging studies  CBC: Recent Labs  Lab 06/25/22 0228 06/25/22 0229  WBC  --  11.2*  NEUTROABS  --  9.7*  HGB 16.3 15.8  16.7  HCT 48.0 49.6  49.0  MCV  --  109.7*  PLT  --  311   Basic Metabolic Panel: Recent Labs  Lab 06/25/22 0228 06/25/22 0229  NA 148* 149*  149*  K 5.3* 5.5*  5.3*  CL  --  105  116*  CO2  --  8*  GLUCOSE  --  616*  633*  BUN  --  54*  51*  CREATININE  --  3.21*  2.40*  CALCIUM  --  10.3   GFR: Estimated Creatinine Clearance: 29.3 mL/min (A) (by C-G formula based on SCr of 3.21 mg/dL (H)). Liver Function Tests: Recent Labs  Lab 06/25/22 0229  AST 13*  ALT 15  ALKPHOS 99  BILITOT 2.4*  PROT 8.4*  ALBUMIN 4.3   No results for input(s): "LIPASE", "AMYLASE" in the last 168 hours. No results for input(s): "AMMONIA" in the last 168 hours. Coagulation Profile: No results for input(s): "INR", "PROTIME" in the last 168 hours. Cardiac Enzymes: No results for input(s): "CKTOTAL", "CKMB", "CKMBINDEX", "TROPONINI" in the last 168 hours. BNP (last 3 results) No results for input(s): "PROBNP" in the last 8760 hours. HbA1C: No results for input(s): "HGBA1C" in the last 72 hours. CBG: Recent Labs  Lab 06/25/22 0214 06/25/22 0402 06/25/22 0441 06/25/22 0537  GLUCAP 551* 551* 483* 466*   Lipid Profile: No results for input(s): "CHOL", "HDL", "LDLCALC", "TRIG", "CHOLHDL", "LDLDIRECT" in the last 72 hours. Thyroid Function Tests: No results for input(s): "TSH", "T4TOTAL", "FREET4", "T3FREE", "THYROIDAB" in the last 72 hours. Anemia Panel: No results for input(s): "VITAMINB12", "FOLATE", "FERRITIN", "TIBC", "IRON", "RETICCTPCT" in the last 72 hours. Urine analysis:    Component Value Date/Time    COLORURINE YELLOW 02/21/2022 1059   APPEARANCEUR HAZY (A) 02/21/2022 1059   LABSPEC 1.024 02/21/2022 1059   PHURINE 5.0 02/21/2022 1059   GLUCOSEU >=500 (A) 02/21/2022 1059   HGBUR NEGATIVE 02/21/2022 1059   BILIRUBINUR NEGATIVE 02/21/2022 1059   BILIRUBINUR negative 01/23/2018 1016   KETONESUR 20 (A) 02/21/2022 1059   PROTEINUR NEGATIVE 02/21/2022 1059   UROBILINOGEN 1.0 01/23/2018 1016   NITRITE NEGATIVE 02/21/2022 1059   LEUKOCYTESUR NEGATIVE 02/21/2022 1059    Radiological Exams on Admission: CT  Head Wo Contrast  Result Date: 06/25/2022 CLINICAL DATA:  Fall down stairs EXAM: CT HEAD WITHOUT CONTRAST CT CERVICAL SPINE WITHOUT CONTRAST TECHNIQUE: Multidetector CT imaging of the head and cervical spine was performed following the standard protocol without intravenous contrast. Multiplanar CT image reconstructions of the cervical spine were also generated. RADIATION DOSE REDUCTION: This exam was performed according to the departmental dose-optimization program which includes automated exposure control, adjustment of the mA and/or kV according to patient size and/or use of iterative reconstruction technique. COMPARISON:  11/02/2021 FINDINGS: CT HEAD FINDINGS Brain: No evidence of acute infarction, hemorrhage, hydrocephalus, extra-axial collection or mass lesion/mass effect. Vascular: No hyperdense vessel or unexpected calcification. Skull: Negative for fracture Sinuses/Orbits: No evidence of injury CT CERVICAL SPINE FINDINGS Alignment: Normal. Skull base and vertebrae: No acute fracture. No primary bone lesion or focal pathologic process. Soft tissues and spinal canal: No prevertebral fluid or swelling. No visible canal hematoma. Disc levels:  Lower cervical disc degeneration. Upper chest: No visible injury IMPRESSION: No evidence of acute intracranial or cervical spine injury. Electronically Signed   By: Tiburcio PeaJonathan  Watts M.D.   On: 06/25/2022 05:31   CT Cervical Spine Wo Contrast  Result Date:  06/25/2022 CLINICAL DATA:  Fall down stairs EXAM: CT HEAD WITHOUT CONTRAST CT CERVICAL SPINE WITHOUT CONTRAST TECHNIQUE: Multidetector CT imaging of the head and cervical spine was performed following the standard protocol without intravenous contrast. Multiplanar CT image reconstructions of the cervical spine were also generated. RADIATION DOSE REDUCTION: This exam was performed according to the departmental dose-optimization program which includes automated exposure control, adjustment of the mA and/or kV according to patient size and/or use of iterative reconstruction technique. COMPARISON:  11/02/2021 FINDINGS: CT HEAD FINDINGS Brain: No evidence of acute infarction, hemorrhage, hydrocephalus, extra-axial collection or mass lesion/mass effect. Vascular: No hyperdense vessel or unexpected calcification. Skull: Negative for fracture Sinuses/Orbits: No evidence of injury CT CERVICAL SPINE FINDINGS Alignment: Normal. Skull base and vertebrae: No acute fracture. No primary bone lesion or focal pathologic process. Soft tissues and spinal canal: No prevertebral fluid or swelling. No visible canal hematoma. Disc levels:  Lower cervical disc degeneration. Upper chest: No visible injury IMPRESSION: No evidence of acute intracranial or cervical spine injury. Electronically Signed   By: Tiburcio PeaJonathan  Watts M.D.   On: 06/25/2022 05:31    EKG: Independently reviewed.  Sinus tachycardia, no significant change since prior tracing.  Assessment and Plan  Severe DKA in the setting of poorly controlled insulin-dependent type 2 diabetes Secondary to noncompliance.  Pharmacy med rec, patient is not taking any of his home medications.  A1c in the 10-11 range since 2020, last A1c 10.5 on 04/26/2022.  He is presenting with severe DKA.  Labs showing glucose 616, bicarb 8, anion gap 36, UA with 80 ketones, beta hydroxybutyric acid >8.0, VBG with pH 7.16. -Keep n.p.o. -Continue insulin and IV fluids per DKA protocol -Frequent CBG  checks per Endo tool -Monitor BMP every 4 hours  Hypernatremia Likely due to severe dehydration in the setting of DKA.  He has very dry mucous membranes. -IV fluid hydration and monitor BMP every 4 hours  AKI Likely prerenal from severe dehydration.  Creatinine 3.2, baseline 1.1. -IV fluid hydration -Monitor renal function closely -Avoid nephrotoxic agents  Acute metabolic encephalopathy Likely secondary to severe DKA/dehydration.  Patient is confused but otherwise following commands appropriately.  CT head negative and no focal neurodeficit. -Continue management as mentioned above and monitor mental status closely  Mild leukocytosis Likely reactive.  No  signs of infection. -Repeat CBC  Hypertension Patient is not on any medications.  Blood pressure stable. -Continue to monitor  Hyperlipidemia Patient is not taking Lipitor. -Lipid panel  DVT prophylaxis: Lovenox (dose adjusted based on renal function) Code Status: Full Code Family Communication: No family available at this time. Level of care: Progressive Care Unit Admission status: It is my clinical opinion that referral for OBSERVATION is reasonable and necessary in this patient based on the above information provided. The aforementioned taken together are felt to place the patient at high risk for further clinical deterioration. However, it is anticipated that the patient may be medically stable for discharge from the hospital within 24 to 48 hours.   John Giovanni MD [facility]  If 7PM-7AM, please contact night-coverage [email or web address]  06/25/2022, 5:45 AM

## 2022-06-25 NOTE — ED Notes (Signed)
Per admitting provider [place], keep patient on insulin drip at this time but change rate to 1 unit/hour. This RN titrated insulin drip at this time with Andris Baumann as witness.

## 2022-06-25 NOTE — Inpatient Diabetes Management (Signed)
Inpatient Diabetes Program Recommendations  AACE/ADA: New Consensus Statement on Inpatient Glycemic Control (2015)  Target Ranges:  Prepandial:   less than 140 mg/dL      Peak postprandial:   less than 180 mg/dL (1-2 hours)      Critically ill patients:  140 - 180 mg/dL   Lab Results  Component Value Date   GLUCAP 125 (H) 06/25/2022   HGBA1C 10.5 (H) 04/26/2022   Review of Glycemic Control  Diabetes history: DM 2 Outpatient Diabetes medications: Basaglar 25 units Daily, Novolog 10 units tid Current orders for Inpatient glycemic control:  IV insulin/Endotool/DKA  A1c 10.5% on 9/30 Goes to [facility]  Inpatient Diabetes Program Recommendations:     Pt is well known to our team and has been spoken to several times this year. A Diabetes Coordinator last spoke with pt on 10/2. Pt is able to obtain medications.   At time of transition consider: -   Semglee 20 units -   Novolog 0-9 units tid + hs -   Novolog 4 units tid meal coverage if eating >50% of meals.  Thanks,  Christena Deem RN, MSN, BC-ADM Inpatient Diabetes Coordinator Team Pager [phone] (8a-5p)

## 2022-06-25 NOTE — ED Notes (Signed)
Patient removed self from c-collar. Patient educated on need for c- collar and safety measures required at this time. Patient is refusing c-collar and states he "will not be wearing that". Patient placed in condom cath. Patient is also refusing to be dressed into hospital gown at this time- will not remove tshirt and pants

## 2022-06-25 NOTE — ED Provider Notes (Signed)
[facility] EMERGENCY DEPARTMENT Provider Note   CSN: [id number] Arrival date & time: 06/25/22  0207     History  Chief Complaint  Patient presents with   Fall   Hyperglycemia    Stanley Edwards is a 56 y.o. male.  HPI     This is a 56 year old male with a history of diabetes and hypertension who presents with altered mental status and fall.  Initially presented as a level 2 trauma.  Per EMS, family heard a fall.  He was found at the bottom of approximately 7 steps.  EMS states that he has been somnolent in route with a waxing and waning mental status.  Upon arrival he is GCS of 14.  Family reports that he sometimes gets confused when he goes into DKA.  They also report that he has a history of noncompliance with insulin.  Patient does not have any complaints at this time.  He is oriented x2.  Home Medications Prior to Admission medications   Medication Sig Start Date End Date Taking? Authorizing Provider  atorvastatin (LIPITOR) 40 MG tablet Take 1 tablet (40 mg total) by mouth daily. Patient not taking: Reported on 06/25/2022 04/28/22   Osvaldo Shipper, MD  Blood Glucose Monitoring Suppl (TRUE METRIX METER) DEVI 1 each by Does not apply route 3 (three) times daily before meals. 05/30/20   Hoy Register, MD  glucose blood (TRUE METRIX BLOOD GLUCOSE TEST) test strip Use 3 times daily before meals 05/30/20   Newlin, [name], MD  insulin aspart (NOVOLOG FLEXPEN) 100 UNIT/ML FlexPen Inject 10 Units into the skin 3 (three) times daily with meals. Patient not taking: Reported on 06/25/2022 05/19/22   Hoy Register, MD  Insulin Glargine [facility] KWIKPEN) 100 UNIT/ML Inject 25 Units into the skin daily. Patient not taking: Reported on 06/25/2022 04/28/22   Osvaldo Shipper, MD  Insulin Pen Needle 32G X 4 MM MISC Use to inject insulin 4 times daily. 11/07/21   Drema Dallas, MD      Allergies    Patient has no known allergies.    Review of Systems   Review of  Systems  Constitutional:  Negative for fever.  Respiratory:  Negative for shortness of breath.   Cardiovascular:  Negative for chest pain.  Gastrointestinal:  Negative for abdominal pain.  Psychiatric/Behavioral:  Positive for confusion.   All other systems reviewed and are negative.   Physical Exam Updated Vital Signs BP 127/79   Pulse (!) 118   Temp 97.9 F (36.6 C) (Oral)   Resp 11   Ht 1.88 m ( )   Wt 81.6 kg   SpO2 100%   BMI 23.10 kg/m  Physical Exam Vitals and nursing note reviewed.  Constitutional:      Appearance: He is well-developed.     Comments: Ill-appearing but nontoxic  HENT:     Head: Normocephalic and atraumatic.     Mouth/Throat:     Mouth: Mucous membranes are dry.  Eyes:     Pupils: Pupils are equal, round, and reactive to light.  Cardiovascular:     Rate and Rhythm: Regular rhythm. Tachycardia present.     Heart sounds: Normal heart sounds. No murmur heard. Pulmonary:     Effort: Pulmonary effort is normal. No respiratory distress.     Breath sounds: Normal breath sounds.  Abdominal:     Palpations: Abdomen is soft.     Tenderness: There is no abdominal tenderness.  Musculoskeletal:     Cervical back:  Neck supple.     Right lower leg: No edema.     Left lower leg: No edema.  Lymphadenopathy:     Cervical: No cervical adenopathy.  Skin:    General: Skin is warm and dry.  Neurological:     Mental Status: He is alert.     Comments: Oriented x2, follows simple commands, moves all 4 extremities  Psychiatric:        Mood and Affect: Mood normal.     ED Results / Procedures / Treatments   Labs (all labs ordered are listed, but only abnormal results are displayed) Labs Reviewed  CBC WITH DIFFERENTIAL/PLATELET - Abnormal; Notable for the following components:      Result Value   WBC 11.2 (*)    MCV 109.7 (*)    MCH 35.0 (*)    RDW 11.0 (*)    Neutro Abs 9.7 (*)    All other components within normal limits  COMPREHENSIVE METABOLIC  PANEL - Abnormal; Notable for the following components:   Sodium 149 (*)    Potassium 5.5 (*)    CO2 8 (*)    Glucose, Bld 616 (*)    BUN 54 (*)    Creatinine, Ser 3.21 (*)    Total Protein 8.4 (*)    AST 13 (*)    Total Bilirubin 2.4 (*)    GFR, Estimated 22 (*)    Anion gap 36 (*)    All other components within normal limits  BETA-HYDROXYBUTYRIC ACID - Abnormal; Notable for the following components:   Beta-Hydroxybutyric Acid >8.00 (*)    All other components within normal limits  I-STAT CHEM 8, ED - Abnormal; Notable for the following components:   Sodium 149 (*)    Potassium 5.3 (*)    Chloride 116 (*)    BUN 51 (*)    Creatinine, Ser 2.40 (*)    Glucose, Bld 633 (*)    TCO2 12 (*)    All other components within normal limits  I-STAT VENOUS BLOOD GAS, ED - Abnormal; Notable for the following components:   pH, Ven 7.168 (*)    pCO2, Ven 30.9 (*)    Bicarbonate 11.2 (*)    TCO2 12 (*)    Acid-base deficit 16.0 (*)    Sodium 148 (*)    Potassium 5.3 (*)    All other components within normal limits  CBG MONITORING, ED - Abnormal; Notable for the following components:   Glucose-Capillary 551 (*)    All other components within normal limits  CBG MONITORING, ED - Abnormal; Notable for the following components:   Glucose-Capillary 551 (*)    All other components within normal limits  CBG MONITORING, ED - Abnormal; Notable for the following components:   Glucose-Capillary 483 (*)    All other components within normal limits  CBG MONITORING, ED - Abnormal; Notable for the following components:   Glucose-Capillary 466 (*)    All other components within normal limits  BETA-HYDROXYBUTYRIC ACID  BETA-HYDROXYBUTYRIC ACID  URINALYSIS, ROUTINE W REFLEX MICROSCOPIC  BASIC METABOLIC PANEL  BASIC METABOLIC PANEL  BASIC METABOLIC PANEL  BASIC METABOLIC PANEL    EKG EKG Interpretation  Date/Time:  Wednesday June 25 2022 02:42:56 EST Ventricular Rate:  124 PR  Interval:  126 QRS Duration: 85 QT Interval:  333 QTC Calculation: 479 R Axis:   89 Text Interpretation: Sinus tachycardia Probable left atrial enlargement Borderline repolarization abnormality Borderline prolonged QT interval Confirmed by Ross MarcusHorton, Sheryll Dymek ([id number]) on 06/25/2022 3:33:15  AM  Radiology CT Head Wo Contrast  Result Date: 06/25/2022 CLINICAL DATA:  Fall down stairs EXAM: CT HEAD WITHOUT CONTRAST CT CERVICAL SPINE WITHOUT CONTRAST TECHNIQUE: Multidetector CT imaging of the head and cervical spine was performed following the standard protocol without intravenous contrast. Multiplanar CT image reconstructions of the cervical spine were also generated. RADIATION DOSE REDUCTION: This exam was performed according to the departmental dose-optimization program which includes automated exposure control, adjustment of the mA and/or kV according to patient size and/or use of iterative reconstruction technique. COMPARISON:  11/02/2021 FINDINGS: CT HEAD FINDINGS Brain: No evidence of acute infarction, hemorrhage, hydrocephalus, extra-axial collection or mass lesion/mass effect. Vascular: No hyperdense vessel or unexpected calcification. Skull: Negative for fracture Sinuses/Orbits: No evidence of injury CT CERVICAL SPINE FINDINGS Alignment: Normal. Skull base and vertebrae: No acute fracture. No primary bone lesion or focal pathologic process. Soft tissues and spinal canal: No prevertebral fluid or swelling. No visible canal hematoma. Disc levels:  Lower cervical disc degeneration. Upper chest: No visible injury IMPRESSION: No evidence of acute intracranial or cervical spine injury. Electronically Signed   By: Tiburcio Pea M.D.   On: 06/25/2022 05:31   CT Cervical Spine Wo Contrast  Result Date: 06/25/2022 CLINICAL DATA:  Fall down stairs EXAM: CT HEAD WITHOUT CONTRAST CT CERVICAL SPINE WITHOUT CONTRAST TECHNIQUE: Multidetector CT imaging of the head and cervical spine was performed following the  standard protocol without intravenous contrast. Multiplanar CT image reconstructions of the cervical spine were also generated. RADIATION DOSE REDUCTION: This exam was performed according to the departmental dose-optimization program which includes automated exposure control, adjustment of the mA and/or kV according to patient size and/or use of iterative reconstruction technique. COMPARISON:  11/02/2021 FINDINGS: CT HEAD FINDINGS Brain: No evidence of acute infarction, hemorrhage, hydrocephalus, extra-axial collection or mass lesion/mass effect. Vascular: No hyperdense vessel or unexpected calcification. Skull: Negative for fracture Sinuses/Orbits: No evidence of injury CT CERVICAL SPINE FINDINGS Alignment: Normal. Skull base and vertebrae: No acute fracture. No primary bone lesion or focal pathologic process. Soft tissues and spinal canal: No prevertebral fluid or swelling. No visible canal hematoma. Disc levels:  Lower cervical disc degeneration. Upper chest: No visible injury IMPRESSION: No evidence of acute intracranial or cervical spine injury. Electronically Signed   By: Tiburcio Pea M.D.   On: 06/25/2022 05:31    Procedures .Critical Care  Performed by: Shon Baton, MD Authorized by: Shon Baton, MD   Critical care provider statement:    Critical care time (minutes):  60   Critical care was necessary to treat or prevent imminent or life-threatening deterioration of the following conditions:  Endocrine crisis and metabolic crisis   Critical care was time spent personally by me on the following activities:  Development of treatment plan with patient or surrogate, discussions with consultants, evaluation of patient's response to treatment, examination of patient, ordering and review of laboratory studies, ordering and review of radiographic studies, ordering and performing treatments and interventions, pulse oximetry, re-evaluation of patient's condition and review of old charts      Medications Ordered in ED Medications  insulin regular, human (MYXREDLIN) 100 units/ 100 mL infusion (11 Units/hr Intravenous Rate/Dose Change 06/25/22 0539)  lactated ringers infusion (has no administration in time range)  dextrose 5 % in lactated ringers infusion (has no administration in time range)  dextrose 50 % solution 0-50 mL (has no administration in time range)  sodium chloride 0.9 % bolus 2,000 mL (2,000 mLs Intravenous New Bag/Given 06/25/22 0239)  lactated ringers bolus 1,632 mL (1,632 mLs Intravenous New Bag/Given 06/25/22 0411)    ED Course/ Medical Decision Making/ A&P                           Medical Decision Making Amount and/or Complexity of Data Reviewed Labs: ordered. Radiology: ordered.  Risk Prescription drug management. Decision regarding hospitalization.   This patient presents to the ED for concern of altered mental status, fall, this involves an extensive number of treatment options, and is a complaint that carries with it a high risk of complications and morbidity.  I considered the following differential and admission for this acute, potentially life threatening condition.  The differential diagnosis includes acute traumatic injury such as head bleed, metabolic crisis such as DKA, hyperglycemia, other metabolic encephalopathy  MDM:    This is a 56 year old male who presents with altered mental status and concern for possible fall.  No external signs of trauma.  CT head neck obtained.  Glucose 551 upon arrival.  EKG shows no signs of acute ischemia.  VBG with pH is 7.16 bicarb of 11.  Patient was initially given 2 L of fluid.  CMP with glucose of 616, sodium 149, creatinine 3.21, anion gap 36.  Patient was started on insulin drip per protocol.  Additional fluids were ordered.  Lab work indicating likely DKA.  CT head neck obtained and showed no evidence of acute traumatic injury.  We will plan for admission.  (Labs, imaging, consults)  Labs: I  Ordered, and personally interpreted labs.  The pertinent results include: CBC, CMP, VBG, beta hydroxy butyrate  Imaging Studies ordered: I ordered imaging studies including CT head neck I independently visualized and interpreted imaging. I agree with the radiologist interpretation  Additional history obtained from EMS.  External records from outside source obtained and reviewed including prior evaluations  Cardiac Monitoring: The patient was maintained on a cardiac monitor.  I personally viewed and interpreted the cardiac monitored which showed an underlying rhythm of: Sinus tachycardia Reevaluation: After the interventions noted above, I reevaluated the patient and found that they have :improved  Social Determinants of Health:  history of medication noncompliance, lives independently  Disposition: Admit  Co morbidities that complicate the patient evaluation  Past Medical History:  Diagnosis Date   Allergy    Diabetes mellitus without complication (HCC)    Hypertension    Pt denies htn but it was on his previous hx     Medicines Meds ordered this encounter  Medications   sodium chloride 0.9 % bolus 2,000 mL   lactated ringers bolus 1,632 mL   insulin regular, human (MYXREDLIN) 100 units/ 100 mL infusion    Order Specific Question:   EndoTool low target:    Answer:   140    Order Specific Question:   EndoTool high target:    Answer:   180    Order Specific Question:   Type of Diabetes    Answer:   Type 1    Order Specific Question:   Mode of Therapy    Answer:   ENDOX1 for DKA    Order Specific Question:   Start Method    Answer:   EndoTool to calculate   lactated ringers infusion   dextrose 5 % in lactated ringers infusion   dextrose 50 % solution 0-50 mL    I have reviewed the patients home medicines and have made adjustments as needed  Problem List / ED  Course: Problem List Items Addressed This Visit       Endocrine   DKA (diabetic ketoacidosis) (HCC) -  Primary   Relevant Medications   insulin regular, human (MYXREDLIN) 100 units/ 100 mL infusion     Genitourinary   AKI (acute kidney injury) (HCC)                Final Clinical Impression(s) / ED Diagnoses Final diagnoses:  Diabetic ketoacidosis without coma associated with type 1 diabetes mellitus (HCC)  AKI (acute kidney injury) (HCC)    Rx / DC Orders ED Discharge Orders     None         Emmakate Hypes, Mayer Masker, MD 06/25/22 [phone]

## 2022-06-26 DIAGNOSIS — W19XXXA Unspecified fall, initial encounter: Secondary | ICD-10-CM | POA: Diagnosis present

## 2022-06-26 DIAGNOSIS — E1010 Type 1 diabetes mellitus with ketoacidosis without coma: Secondary | ICD-10-CM | POA: Diagnosis not present

## 2022-06-26 DIAGNOSIS — D72829 Elevated white blood cell count, unspecified: Secondary | ICD-10-CM | POA: Diagnosis present

## 2022-06-26 DIAGNOSIS — E1110 Type 2 diabetes mellitus with ketoacidosis without coma: Secondary | ICD-10-CM | POA: Diagnosis present

## 2022-06-26 DIAGNOSIS — I10 Essential (primary) hypertension: Secondary | ICD-10-CM | POA: Diagnosis present

## 2022-06-26 DIAGNOSIS — E876 Hypokalemia: Secondary | ICD-10-CM | POA: Diagnosis not present

## 2022-06-26 DIAGNOSIS — Z91148 Patient's other noncompliance with medication regimen for other reason: Secondary | ICD-10-CM | POA: Diagnosis not present

## 2022-06-26 DIAGNOSIS — Z794 Long term (current) use of insulin: Secondary | ICD-10-CM | POA: Diagnosis not present

## 2022-06-26 DIAGNOSIS — E870 Hyperosmolality and hypernatremia: Secondary | ICD-10-CM | POA: Diagnosis present

## 2022-06-26 DIAGNOSIS — N179 Acute kidney failure, unspecified: Secondary | ICD-10-CM | POA: Diagnosis present

## 2022-06-26 DIAGNOSIS — R000 Tachycardia, unspecified: Secondary | ICD-10-CM | POA: Diagnosis present

## 2022-06-26 DIAGNOSIS — Z833 Family history of diabetes mellitus: Secondary | ICD-10-CM | POA: Diagnosis not present

## 2022-06-26 DIAGNOSIS — E860 Dehydration: Secondary | ICD-10-CM | POA: Diagnosis present

## 2022-06-26 DIAGNOSIS — E785 Hyperlipidemia, unspecified: Secondary | ICD-10-CM | POA: Diagnosis present

## 2022-06-26 DIAGNOSIS — Z91199 Patient's noncompliance with other medical treatment and regimen due to unspecified reason: Secondary | ICD-10-CM | POA: Diagnosis not present

## 2022-06-26 DIAGNOSIS — G9341 Metabolic encephalopathy: Secondary | ICD-10-CM | POA: Diagnosis present

## 2022-06-26 DIAGNOSIS — Z79899 Other long term (current) drug therapy: Secondary | ICD-10-CM | POA: Diagnosis not present

## 2022-06-26 LAB — BASIC METABOLIC PANEL
Anion gap: 13 (ref 5–15)
BUN: 31 mg/dL — ABNORMAL HIGH (ref 6–20)
CO2: 25 mmol/L (ref 22–32)
Calcium: 9.3 mg/dL (ref 8.9–10.3)
Chloride: 116 mmol/L — ABNORMAL HIGH (ref 98–111)
Creatinine, Ser: 1.69 mg/dL — ABNORMAL HIGH (ref 0.61–1.24)
GFR, Estimated: 47 mL/min — ABNORMAL LOW (ref >60)
Glucose, Bld: 135 mg/dL — ABNORMAL HIGH (ref 70–99)
Potassium: 3.6 mmol/L (ref 3.5–5.1)
Sodium: 154 mmol/L — ABNORMAL HIGH (ref 135–145)

## 2022-06-26 LAB — GLUCOSE, CAPILLARY
Glucose-Capillary: 200 mg/dL — ABNORMAL HIGH (ref 70–99)
Glucose-Capillary: 183 mg/dL — ABNORMAL HIGH (ref 70–99)
Glucose-Capillary: 111 mg/dL — ABNORMAL HIGH (ref 70–99)
Glucose-Capillary: 144 mg/dL — ABNORMAL HIGH (ref 70–99)

## 2022-06-26 LAB — LIPID PANEL
Cholesterol: 232 mg/dL — ABNORMAL HIGH (ref 0–200)
HDL: 40 mg/dL — ABNORMAL LOW (ref >40)
LDL Cholesterol: 174 mg/dL — ABNORMAL HIGH (ref 0–99)
Total CHOL/HDL Ratio: 5.8 RATIO
Triglycerides: 92 mg/dL (ref <150)
VLDL: 18 mg/dL (ref 0–40)

## 2022-06-26 MED ORDER — ATORVASTATIN CALCIUM 40 MG PO TABS
40.0000 mg | ORAL_TABLET | Freq: Every day | ORAL | Status: DC
  Administered 2022-06-26 – 2022-06-27 (×2): 40 mg via ORAL
  Filled 2022-06-26 (×2): qty 1

## 2022-06-26 NOTE — Progress Notes (Signed)
PROGRESS NOTE    Stanley Edwards  VEL:[id number] DOB: 02-Jan-1966 DOA: 06/25/2022 PCP: Hoy Register, MD   Brief Narrative: 56 year old past medical history significant for poorly controlled diabetes type 2, medical noncompliance, hypertension, hyperlipidemia presented via EMS after a fall.  He was found to be tachycardic, in DKA with a pH of 7.1 beta butyric acid more than 8, UA with more than 80 ketones.  CT head CT spine negative for acute finding.  Patient was started on insulin drip and IV fluids.     Assessment & Plan:   Principal Problem:   DKA (diabetic ketoacidosis) (HCC) Active Problems:   DM2 (diabetes mellitus, type 2) (HCC)   Hyperlipidemia   AKI (acute kidney injury) (HCC)   Hypertension   Acute metabolic encephalopathy   Hypernatremia   1-DKA: Severe in the setting of poorly controlled diabetes and noncompliance with medication. He was started on IV fluids and insulin drip.  Gap 15, bicarb 24 resolved. Transition to Semglee 20 units and sliding scale and 4 units meal coverage He will needs refill of meds and needs med with TOC.   2-Hypernatremia In the setting of the DKA and dehydration. Continue with Half NS>  Encourage oral intake.   3-AKI: Creatinine baseline 1.1, creatinine peaked to 3.2.  Continue with IV fluids. In the setting of DKA Improving.   4-Acute metabolic encephalopathy likely related to DKA.  CT head negative. He is alert and conversant today.   5-Mild leukocytosis: Monitor Hyperlipidemia: resume Lipitor.     Estimated body mass index is 23.1 kg/m as calculated from the following:   Height as of this encounter: 6\' 2"  (1.88 m).   Weight as of this encounter: 81.6 kg.   DVT prophylaxis: Lovenox Code Status: Full code Family Communication: Care discussed with patient.  Disposition Plan:  Status is: Observation The patient remains OBS appropriate and will d/c before 2 midnights.    Consultants:  none  Procedures:     Antimicrobials:    Subjective: He is alert, conversant answering questions. Denies pain.  He relates any of the pharmacy had his insulin, he try multiples pharmacy, couldn't get meds.   Objective: Vitals:   06/26/22 0017 06/26/22 0434 06/26/22 0747 06/26/22 1108  BP: 111/67 115/72 112/65 109/63  Pulse:  87 75 96  Resp: 14 14 15 18   Temp: 98 F (36.7 C) 98 F (36.7 C)    TempSrc: Oral Oral    SpO2: 100% 99% 100% 95%  Weight:      Height:        Intake/Output Summary (Last 24 hours) at 06/26/2022 1355 Last data filed at 06/26/2022 0900 Gross per 24 hour  Intake 2132.5 ml  Output 975 ml  Net 1157.5 ml   Filed Weights   06/25/22 0213  Weight: 81.6 kg    Examination:  General exam: NAD Respiratory system: CTA Cardiovascular system: S 1, S 2 RRR Gastrointestinal system: BS present, soft, nt Central nervous system: Alert, follows command Extremities: No edema    Data Reviewed: I have personally reviewed following labs and imaging studies  CBC: Recent Labs  Lab 06/25/22 0228 06/25/22 0229 06/25/22 0630  WBC  --  11.2* 11.1*  NEUTROABS  --  9.7*  --   HGB 16.3 15.8  16.7 13.2  HCT 48.0 49.6  49.0 40.3  MCV  --  109.7* 106.3*  PLT  --  311 285    Basic Metabolic Panel: Recent Labs  Lab 06/25/22 0228 06/25/22 0229  06/25/22 0630 06/25/22 1223 06/26/22 0128  NA 148* 149*  149* 148* 159* 154*  K 5.3* 5.5*  5.3* 3.7 3.7 3.6  CL  --  105  116* 113* 120* 116*  CO2  --  8* 14* 24 25  GLUCOSE  --  616*  633* 404* 142* 135*  BUN  --  54*  51* 48* 40* 31*  CREATININE  --  3.21*  2.40* 2.96* 2.15* 1.69*  CALCIUM  --  10.3 8.9 9.9 9.3    GFR: Estimated Creatinine Clearance: 55.7 mL/min (A) (by C-G formula based on SCr of 1.69 mg/dL (H)). Liver Function Tests: Recent Labs  Lab 06/25/22 0229  AST 13*  ALT 15  ALKPHOS 99  BILITOT 2.4*  PROT 8.4*  ALBUMIN 4.3    No results for input(s): "LIPASE", "AMYLASE" in the last 168 hours. No  results for input(s): "AMMONIA" in the last 168 hours. Coagulation Profile: No results for input(s): "INR", "PROTIME" in the last 168 hours. Cardiac Enzymes: No results for input(s): "CKTOTAL", "CKMB", "CKMBINDEX", "TROPONINI" in the last 168 hours. BNP (last 3 results) No results for input(s): "PROBNP" in the last 8760 hours. HbA1C: No results for input(s): "HGBA1C" in the last 72 hours. CBG: Recent Labs  Lab 06/25/22 1311 06/25/22 1714 06/25/22 2229 06/26/22 0623 06/26/22 1105  GLUCAP 125* 212* 130* 200* 183*    Lipid Profile: Recent Labs    06/26/22 0128  CHOL 232*  HDL 40*  LDLCALC 174*  TRIG 92  CHOLHDL 5.8   Thyroid Function Tests: No results for input(s): "TSH", "T4TOTAL", "FREET4", "T3FREE", "THYROIDAB" in the last 72 hours. Anemia Panel: No results for input(s): "VITAMINB12", "FOLATE", "FERRITIN", "TIBC", "IRON", "RETICCTPCT" in the last 72 hours. Sepsis Labs: No results for input(s): "PROCALCITON", "LATICACIDVEN" in the last 168 hours.  No results found for this or any previous visit (from the past 240 hour(s)).       Radiology Studies: CT Head Wo Contrast  Result Date: 06/25/2022 CLINICAL DATA:  Fall down stairs EXAM: CT HEAD WITHOUT CONTRAST CT CERVICAL SPINE WITHOUT CONTRAST TECHNIQUE: Multidetector CT imaging of the head and cervical spine was performed following the standard protocol without intravenous contrast. Multiplanar CT image reconstructions of the cervical spine were also generated. RADIATION DOSE REDUCTION: This exam was performed according to the departmental dose-optimization program which includes automated exposure control, adjustment of the mA and/or kV according to patient size and/or use of iterative reconstruction technique. COMPARISON:  11/02/2021 FINDINGS: CT HEAD FINDINGS Brain: No evidence of acute infarction, hemorrhage, hydrocephalus, extra-axial collection or mass lesion/mass effect. Vascular: No hyperdense vessel or unexpected  calcification. Skull: Negative for fracture Sinuses/Orbits: No evidence of injury CT CERVICAL SPINE FINDINGS Alignment: Normal. Skull base and vertebrae: No acute fracture. No primary bone lesion or focal pathologic process. Soft tissues and spinal canal: No prevertebral fluid or swelling. No visible canal hematoma. Disc levels:  Lower cervical disc degeneration. Upper chest: No visible injury IMPRESSION: No evidence of acute intracranial or cervical spine injury. Electronically Signed   By: Tiburcio Pea M.D.   On: 06/25/2022 05:31   CT Cervical Spine Wo Contrast  Result Date: 06/25/2022 CLINICAL DATA:  Fall down stairs EXAM: CT HEAD WITHOUT CONTRAST CT CERVICAL SPINE WITHOUT CONTRAST TECHNIQUE: Multidetector CT imaging of the head and cervical spine was performed following the standard protocol without intravenous contrast. Multiplanar CT image reconstructions of the cervical spine were also generated. RADIATION DOSE REDUCTION: This exam was performed according to the departmental dose-optimization program which  includes automated exposure control, adjustment of the mA and/or kV according to patient size and/or use of iterative reconstruction technique. COMPARISON:  11/02/2021 FINDINGS: CT HEAD FINDINGS Brain: No evidence of acute infarction, hemorrhage, hydrocephalus, extra-axial collection or mass lesion/mass effect. Vascular: No hyperdense vessel or unexpected calcification. Skull: Negative for fracture Sinuses/Orbits: No evidence of injury CT CERVICAL SPINE FINDINGS Alignment: Normal. Skull base and vertebrae: No acute fracture. No primary bone lesion or focal pathologic process. Soft tissues and spinal canal: No prevertebral fluid or swelling. No visible canal hematoma. Disc levels:  Lower cervical disc degeneration. Upper chest: No visible injury IMPRESSION: No evidence of acute intracranial or cervical spine injury. Electronically Signed   By: Tiburcio Pea M.D.   On: 06/25/2022 05:31         Scheduled Meds:  atorvastatin  40 mg Oral Daily   enoxaparin (LOVENOX) injection  30 mg Subcutaneous Q24H   insulin aspart  0-15 Units Subcutaneous TID WC   insulin aspart  0-5 Units Subcutaneous QHS   insulin aspart  4 Units Subcutaneous TID WC   insulin glargine-yfgn  20 Units Subcutaneous Daily   Continuous Infusions:  sodium chloride 125 mL/hr at 06/26/22 0343     LOS: 0 days    Time spent: 35 minutes.     Alba Cory, MD [facility]   If 7PM-7AM, please contact night-coverage [email or web address]  06/26/2022, 1:55 PM

## 2022-06-27 ENCOUNTER — Other Ambulatory Visit (HOSPITAL_COMMUNITY): Payer: Self-pay

## 2022-06-27 DIAGNOSIS — E1010 Type 1 diabetes mellitus with ketoacidosis without coma: Secondary | ICD-10-CM | POA: Diagnosis not present

## 2022-06-27 LAB — GLUCOSE, CAPILLARY: Glucose-Capillary: 234 mg/dL — ABNORMAL HIGH (ref 70–99)

## 2022-06-27 LAB — BASIC METABOLIC PANEL
Anion gap: 10 (ref 5–15)
BUN: 21 mg/dL — ABNORMAL HIGH (ref 6–20)
CO2: 25 mmol/L (ref 22–32)
Calcium: 8.3 mg/dL — ABNORMAL LOW (ref 8.9–10.3)
Chloride: 106 mmol/L (ref 98–111)
Creatinine, Ser: 1.24 mg/dL (ref 0.61–1.24)
GFR, Estimated: >60 mL/min (ref >60)
Glucose, Bld: 143 mg/dL — ABNORMAL HIGH (ref 70–99)
Potassium: 2.9 mmol/L — ABNORMAL LOW (ref 3.5–5.1)
Sodium: 141 mmol/L (ref 135–145)

## 2022-06-27 LAB — MAGNESIUM: Magnesium: 1.8 mg/dL (ref 1.7–2.4)

## 2022-06-27 MED ORDER — POTASSIUM CHLORIDE CRYS ER 20 MEQ PO TBCR: 40.0000 meq | EXTENDED_RELEASE_TABLET | ORAL | Status: DC

## 2022-06-27 MED ORDER — MAGNESIUM SULFATE 2 GM/50ML IV SOLN
2.0000 g | Freq: Once | INTRAVENOUS | Status: AC
  Administered 2022-06-27: 2 g via INTRAVENOUS
  Filled 2022-06-27: qty 50

## 2022-06-27 MED ORDER — INSULIN PEN NEEDLE 32G X 4 MM MISC
0 refills | Status: DC
  Filled 2022-06-27: qty 100, 30d supply, fill #0

## 2022-06-27 MED ORDER — POTASSIUM CHLORIDE 10 MEQ/100ML IV SOLN
10.0000 meq | INTRAVENOUS | Status: AC
  Administered 2022-06-27 (×4): 10 meq via INTRAVENOUS
  Filled 2022-06-27 (×4): qty 100

## 2022-06-27 MED ORDER — INSULIN ASPART 100 UNIT/ML IJ SOLN
8.0000 [IU] | Freq: Three times a day (TID) | INTRAMUSCULAR | Status: DC
  Administered 2022-06-27: 8 [IU] via SUBCUTANEOUS

## 2022-06-27 MED ORDER — NOVOLOG FLEXPEN 100 UNIT/ML ~~LOC~~ SOPN
8.0000 [IU] | PEN_INJECTOR | Freq: Three times a day (TID) | SUBCUTANEOUS | 0 refills | Status: DC
  Filled 2022-06-27: qty 9, 30d supply, fill #0

## 2022-06-27 MED ORDER — BASAGLAR KWIKPEN 100 UNIT/ML ~~LOC~~ SOPN
20.0000 [IU] | PEN_INJECTOR | Freq: Every day | SUBCUTANEOUS | 2 refills | Status: DC
  Filled 2022-06-27: qty 6, 30d supply, fill #0
  Filled 2022-08-28: qty 6, 30d supply, fill #1

## 2022-06-27 MED ORDER — ATORVASTATIN CALCIUM 40 MG PO TABS
40.0000 mg | ORAL_TABLET | Freq: Every day | ORAL | 2 refills | Status: DC
  Filled 2022-06-27: qty 30, 30d supply, fill #0

## 2022-06-27 MED ORDER — POTASSIUM CHLORIDE CRYS ER 20 MEQ PO TBCR
40.0000 meq | EXTENDED_RELEASE_TABLET | Freq: Once | ORAL | Status: AC
  Administered 2022-06-27: 40 meq via ORAL
  Filled 2022-06-27: qty 2

## 2022-06-27 MED ORDER — POTASSIUM CHLORIDE CRYS ER 20 MEQ PO TBCR
40.0000 meq | EXTENDED_RELEASE_TABLET | Freq: Every day | ORAL | 0 refills | Status: DC
  Filled 2022-06-27: qty 8, 4d supply, fill #0

## 2022-06-27 NOTE — Progress Notes (Signed)
Pt being d/c.  PIVs removed Discharge information given along w/excuse note Pt verb understanding and had no further questions Wheel chaired pt out to lobby to await for cab.

## 2022-06-27 NOTE — TOC Progression Note (Addendum)
Transition of Care [facility]) - Progression Note    Patient Details  Name: Stanley Edwards MRN: [id number] Date of Birth: [birth year]  [facility] CM/SW Contact  Leone Haven, RN Phone Number: 06/27/2022, 8:57 AM  Clinical Narrative:    Patient is for dc today, TOC to do medications. Patient has [employer] on file.         Expected Discharge Plan and Services           Expected Discharge Date: 06/27/22                                     Social Determinants of Health (SDOH) Interventions    Readmission Risk Interventions    04/28/2022   10:03 AM  Readmission Risk Prevention Plan  Transportation Screening Complete  PCP or Specialist Appt within 5-7 Days Complete  Home Care Screening Complete  Medication Review (RN CM) Complete

## 2022-06-27 NOTE — Progress Notes (Signed)
[facility] night cross cover note:   I was notified by RN of potassium level of 2.9 this morning.   Per my chart review, this is relative to potassium of 3.6 yesterday AM, with this morning's labs also reflecting interval improvement in creatinine, now down to 1.24 in setting of initial AKI.   In this patient who was admitted for DKA, he was transitioned to subcutaneous insulin yesterday, and insulin drip via Endo tool was discontinued.   Given improvement in aki, I ordered Kcl 40 mEq po x 1 dose as well as 40 mEq of IV Kcl. Will also add-on serum magnesium level.      Stanley Pigg, DO Hospitalist

## 2022-06-27 NOTE — Progress Notes (Signed)
Ambulated pt in the hallway w/no events noted... HR was ST in the low 100s and O2 was 100%... Pt denied SOB, dyspnea, light headedness.... Pt has d/c orders... Meds have arrived from pharmacy... updated MD.... will be d/c home soon.

## 2022-06-27 NOTE — Discharge Summary (Signed)
Physician Discharge Summary   Patient: Stanley Edwards MRN: [id number] DOB: 11-07-1965  Admit date:     06/25/2022  Discharge date: 06/27/22  Discharge Physician: Alba Cory   PCP: Hoy Register, MD   Recommendations at discharge:   Needs further adjustment of Insulin regimen.  Needs LFT on initiation of Lipitor.   Discharge Diagnoses: Principal Problem:   DKA (diabetic ketoacidosis) (HCC) Active Problems:   DM2 (diabetes mellitus, type 2) (HCC)   Hyperlipidemia   AKI (acute kidney injury) (HCC)   Hypertension   Acute metabolic encephalopathy   Hypernatremia  Resolved Problems:   * No resolved hospital problems. *  Hospital Course: 56 year old past medical history significant for poorly controlled diabetes type 2, medical noncompliance, hypertension, hyperlipidemia presented via EMS after a fall.  He was found to be tachycardic, in DKA with a pH of 7.1 beta butyric acid more than 8, UA with more than 80 ketones.  CT head CT spine negative for acute finding.  Patient was started on insulin drip and IV fluids.    Assessment and Plan: 1-DKA: Severe in the setting of poorly controlled diabetes and noncompliance with medication. He was started on IV fluids and insulin drip.  Gap 15, bicarb 24 resolved. Transition to Semglee 20 units and sliding scale and 4 units meal coverage Meds provided prior to discharge.  Resolved.  He will be discharge on 20 units gargline and 8 units meal coverage.   2-Hypernatremia In the setting of the DKA and dehydration. Continue with Half NS>  Encourage oral intake.  Resolved.   3-AKI: Creatinine baseline 1.1, creatinine peaked to 3.2.  Continue with IV fluids. In the setting of DKA Improving.    4-Acute metabolic encephalopathy likely related to DKA.  CT head negative. He is alert and conversant today.  Resolved.   5-Mild leukocytosis: Monitor Hyperlipidemia: resume Lipitor.    Hypokalemia; replaced prior to discharge. He  will be discharge on potassium supplement.        Consultants:None Procedures performed: None Disposition: Home Diet recommendation:  Discharge Diet Orders (From admission, onward)     Start     Ordered   06/27/22 0000  Diet - low sodium heart healthy        06/27/22 0846           Carb modified diet DISCHARGE MEDICATION: Allergies as of 06/27/2022   No Known Allergies      Medication List     TAKE these medications    atorvastatin 40 MG tablet Commonly known as: LIPITOR Take 1 tablet (40 mg total) by mouth daily.   Basaglar KwikPen 100 UNIT/ML Inject 20 Units into the skin daily. What changed: how much to take   Insulin Pen Needle 32G X 4 MM Misc Use to inject insulin 4 times daily.   NovoLOG FlexPen 100 UNIT/ML FlexPen Generic drug: insulin aspart Inject 8 Units into the skin 3 (three) times daily with meals. What changed: how much to take   potassium chloride SA 20 MEQ tablet Commonly known as: KLOR-CON M Take 2 tablets (40 mEq total) by mouth daily for 4 days.   True Metrix Blood Glucose Test test strip Generic drug: glucose blood Use 3 times daily before meals   True Metrix Meter Devi 1 each by Does not apply route 3 (three) times daily before meals.        Discharge Exam: Filed Weights   06/25/22 0213 06/27/22 0011  Weight: 81.6 kg 81 kg  General; NAD  Condition at discharge: stable  The results of significant diagnostics from this hospitalization (including imaging, microbiology, ancillary and laboratory) are listed below for reference.   Imaging Studies: CT Head Wo Contrast  Result Date: 06/25/2022 CLINICAL DATA:  Fall down stairs EXAM: CT HEAD WITHOUT CONTRAST CT CERVICAL SPINE WITHOUT CONTRAST TECHNIQUE: Multidetector CT imaging of the head and cervical spine was performed following the standard protocol without intravenous contrast. Multiplanar CT image reconstructions of the cervical spine were also generated. RADIATION DOSE  REDUCTION: This exam was performed according to the departmental dose-optimization program which includes automated exposure control, adjustment of the mA and/or kV according to patient size and/or use of iterative reconstruction technique. COMPARISON:  11/02/2021 FINDINGS: CT HEAD FINDINGS Brain: No evidence of acute infarction, hemorrhage, hydrocephalus, extra-axial collection or mass lesion/mass effect. Vascular: No hyperdense vessel or unexpected calcification. Skull: Negative for fracture Sinuses/Orbits: No evidence of injury CT CERVICAL SPINE FINDINGS Alignment: Normal. Skull base and vertebrae: No acute fracture. No primary bone lesion or focal pathologic process. Soft tissues and spinal canal: No prevertebral fluid or swelling. No visible canal hematoma. Disc levels:  Lower cervical disc degeneration. Upper chest: No visible injury IMPRESSION: No evidence of acute intracranial or cervical spine injury. Electronically Signed   By: Tiburcio Pea M.D.   On: 06/25/2022 05:31   CT Cervical Spine Wo Contrast  Result Date: 06/25/2022 CLINICAL DATA:  Fall down stairs EXAM: CT HEAD WITHOUT CONTRAST CT CERVICAL SPINE WITHOUT CONTRAST TECHNIQUE: Multidetector CT imaging of the head and cervical spine was performed following the standard protocol without intravenous contrast. Multiplanar CT image reconstructions of the cervical spine were also generated. RADIATION DOSE REDUCTION: This exam was performed according to the departmental dose-optimization program which includes automated exposure control, adjustment of the mA and/or kV according to patient size and/or use of iterative reconstruction technique. COMPARISON:  11/02/2021 FINDINGS: CT HEAD FINDINGS Brain: No evidence of acute infarction, hemorrhage, hydrocephalus, extra-axial collection or mass lesion/mass effect. Vascular: No hyperdense vessel or unexpected calcification. Skull: Negative for fracture Sinuses/Orbits: No evidence of injury CT CERVICAL SPINE  FINDINGS Alignment: Normal. Skull base and vertebrae: No acute fracture. No primary bone lesion or focal pathologic process. Soft tissues and spinal canal: No prevertebral fluid or swelling. No visible canal hematoma. Disc levels:  Lower cervical disc degeneration. Upper chest: No visible injury IMPRESSION: No evidence of acute intracranial or cervical spine injury. Electronically Signed   By: Tiburcio Pea M.D.   On: 06/25/2022 05:31    Microbiology: Results for orders placed or performed during the hospital encounter of 04/26/22  MRSA Next Gen by PCR, Nasal     Status: None   Collection Time: 04/26/22  6:03 PM   Specimen: Nasal Mucosa; Nasal Swab  Result Value Ref Range Status   MRSA by PCR Next Gen NOT DETECTED NOT DETECTED Final    Comment: (NOTE) The GeneXpert MRSA Assay (FDA approved for NASAL specimens only), is one component of a comprehensive MRSA colonization surveillance program. It is not intended to diagnose MRSA infection nor to guide or monitor treatment for MRSA infections. Test performance is not FDA approved in patients less than 69 years old. Performed at [facility], [address] [place], [place] [address]     Labs: CBC: Recent Labs  Lab 06/25/22 0228 06/25/22 0229 06/25/22 0630  WBC  --  11.2* 11.1*  NEUTROABS  --  9.7*  --   HGB 16.3 15.8  16.7 13.2  HCT 48.0  49.6  49.0 40.3  MCV  --  109.7* 106.3*  PLT  --  311 285   Basic Metabolic Panel: Recent Labs  Lab 06/25/22 0229 06/25/22 0630 06/25/22 1223 06/26/22 0128 06/27/22 0056  NA 149*  149* 148* 159* 154* 141  K 5.5*  5.3* 3.7 3.7 3.6 2.9*  CL 105  116* 113* 120* 116* 106  CO2 8* 14* 24 25 25   GLUCOSE 616*  633* 404* 142* 135* 143*  BUN 54*  51* 48* 40* 31* 21*  CREATININE 3.21*  2.40* 2.96* 2.15* 1.69* 1.24  CALCIUM 10.3 8.9 9.9 9.3 8.3*  MG  --   --   --   --  1.8   Liver Function Tests: Recent Labs  Lab 06/25/22 0229  AST 13*  ALT 15  ALKPHOS 99   BILITOT 2.4*  PROT 8.4*  ALBUMIN 4.3   CBG: Recent Labs  Lab 06/26/22 0623 06/26/22 1105 06/26/22 1605 06/26/22 2114 06/27/22 0535  GLUCAP 200* 183* 111* 144* 234*    Discharge time spent: greater than 30 minutes.  Signed: 14/01/23, MD [facility] 06/27/2022

## 2022-06-30 ENCOUNTER — Other Ambulatory Visit: Payer: Self-pay

## 2022-06-30 ENCOUNTER — Telehealth: Payer: Self-pay | Admitting: *Deleted

## 2022-06-30 ENCOUNTER — Telehealth: Payer: Self-pay

## 2022-06-30 DIAGNOSIS — I10 Essential (primary) hypertension: Secondary | ICD-10-CM

## 2022-06-30 DIAGNOSIS — E1165 Type 2 diabetes mellitus with hyperglycemia: Secondary | ICD-10-CM

## 2022-06-30 DIAGNOSIS — E1110 Type 2 diabetes mellitus with ketoacidosis without coma: Secondary | ICD-10-CM

## 2022-06-30 NOTE — Patient Outreach (Addendum)
  Care Coordination [facility] Note Transition Care Management Follow-up Telephone Call Date of discharge and from where: 12/01/23[facility] Dx: DKA How have you been since you were released from the hospital? Patient voices he is doing good since returning home and plans to return to work soon. Blood sugars this morning was 129-fasting. His highest cbg since returning home has ben 179. Patient voices he is taking his insulins as ordered. Any questions or concerns? No  Items Reviewed: Did the pt receive and understand the discharge instructions provided? Yes  Medications obtained and verified? Yes  Other? Yes  Any new allergies since your discharge? No  Dietary orders reviewed? Yes Do you have support at home? Yes   Home Care and Equipment/Supplies: Were home health services ordered? not applicable If so, what is the name of the agency? N/A  Has the agency set up a time to come to the patient's home? not applicable Were any new equipment or medical supplies ordered?  No What is the name of the medical supply agency? N/A Were you able to get the supplies/equipment? not applicable Do you have any questions related to the use of the equipment or supplies? No  Functional Questionnaire: (I = Independent and D = Dependent) ADLs: I  Bathing/Dressing- I  Meal Prep- I  Eating- I  Maintaining continence- I  Transferring/Ambulation- I  Managing Meds- I  Follow up appointments reviewed:  PCP Hospital f/u appt confirmed? Yes  Scheduled to see Dr. Alvis Lemmings on 07/10/22 @ 10:10am. Specialist Hospital f/u appt confirmed?  N/A . Are transportation arrangements needed? No  If their condition worsens, is the pt aware to call PCP or go to the Emergency Dept.? Yes Was the patient provided with contact information for the PCP's office or ED? Yes Was to pt encouraged to call back with questions or concerns? Yes  SDOH assessments and interventions completed:   Yes   Care Coordination  Interventions:  Education provided    Encounter Outcome:  Pt. Visit Completed    Antionette Fairy, RN,BSN,CCM [facility] Care Management Telephonic Care Management Coordinator Direct Phone: [phone] Toll Free: [phone] Fax: [phone]

## 2022-06-30 NOTE — Progress Notes (Unsigned)
  Care Coordination  Outreach Note  06/30/2022 Name: EVERARD INTERRANTE MRN: [id number] DOB: 05-22-1966   Care Coordination Outreach Attempts: An unsuccessful telephone outreach was attempted today to offer the patient information about available care coordination services as a benefit of their health plan.   Follow Up Plan:  Additional outreach attempts will be made to offer the patient care coordination information and services.   Encounter Outcome:  No Answer  Christie Nottingham  Care Coordination Care Guide  Direct Dial: [phone]

## 2022-06-30 NOTE — Patient Outreach (Signed)
Stanley Edwards [birth year] [id number]   [facility] [[facility]]  Accountable Care Organization [ACO] Patient: [employer]  Patient was evaluated for [facility] [[facility]]  Care Management services for readmission prevention for post hospital follow up needs.  Latest Hemoglobin A1C noted a 10.5 and admitted with Diabetes Ketoacidosis.  [id number] Telephone outreach call as patient transitioned over the weekend for Diabetes care coordination needs as reviewed.  Was able to leave a HIPAA appropriate voicemail requesting a return call.  Plan:  Will request referral for [facility] for Care Coordination for Diabetes Management.  Of note, [facility] Care Management services does not replace or interfere with any services that are arranged by inpatient  Transition of Care [TOC] team.  For additional questions or referrals please contact:    For questions, please contact:   Charlesetta Shanks, RN BSN CCM [facility]  [phone] business mobile phone Toll free office [phone]  *Concierge Line  [phone] Fax number: [phone] [email or web address]

## 2022-07-01 NOTE — Progress Notes (Unsigned)
  Care Coordination  Outreach Note  07/01/2022 Name: Stanley Edwards MRN: [id number] DOB: 1966-01-29   Care Coordination Outreach Attempts: An unsuccessful telephone outreach was attempted today to offer the patient information about available care coordination services as a benefit of their health plan.   Follow Up Plan:  Additional outreach attempts will be made to offer the patient care coordination information and services.   Encounter Outcome:  No Answer  Christie Nottingham  Care Coordination Care Guide  Direct Dial: [phone]

## 2022-07-02 NOTE — Progress Notes (Signed)
  Care Coordination  Outreach Note  07/02/2022 Name: Stanley Edwards MRN: [id number] DOB: [birth year]   Care Coordination Outreach Attempts: A third unsuccessful outreach was attempted today to offer the patient with information about available care coordination services as a benefit of their health plan.   Follow Up Plan:  No further outreach attempts will be made at this time. We have been unable to contact the patient to offer or enroll patient in care coordination services  Encounter Outcome:  No Answer  Christie Nottingham  Care Coordination Care Guide  Direct Dial: [phone]

## 2022-07-09 ENCOUNTER — Inpatient Hospital Stay: Payer: 59 | Admitting: Family Medicine

## 2022-07-09 NOTE — Progress Notes (Signed)
Patient ID: Stanley Edwards, male   DOB: 10-12-1965, 56 y.o.   MRN: [id number]   After hospitalization 11/29-12/07/2021  From discharge summary: Needs further adjustment of Insulin regimen.  Needs LFT on initiation of Lipitor.    Discharge Diagnoses: Principal Problem:   DKA (diabetic ketoacidosis) (HCC) Active Problems:   DM2 (diabetes mellitus, type 2) (HCC)   Hyperlipidemia   AKI (acute kidney injury) (HCC)   Hypertension   Acute metabolic encephalopathy   Hypernatremia   Resolved Problems:   * No resolved hospital problems. *   Hospital Course: 56 year old past medical history significant for poorly controlled diabetes type 2, medical noncompliance, hypertension, hyperlipidemia presented via EMS after a fall.  He was found to be tachycardic, in DKA with a pH of 7.1 beta butyric acid more than 8, UA with more than 80 ketones.  CT head CT spine negative for acute finding.  Patient was started on insulin drip and IV fluids.     Assessment and Plan: 1-DKA: Severe in the setting of poorly controlled diabetes and noncompliance with medication. He was started on IV fluids and insulin drip.  Gap 15, bicarb 24 resolved. Transition to Semglee 20 units and sliding scale and 4 units meal coverage Meds provided prior to discharge.  Resolved.  He will be discharge on 20 units gargline and 8 units meal coverage.    2-Hypernatremia In the setting of the DKA and dehydration. Continue with Half NS>  Encourage oral intake.  Resolved.    3-AKI: Creatinine baseline 1.1, creatinine peaked to 3.2.  Continue with IV fluids. In the setting of DKA Improving.    4-Acute metabolic encephalopathy likely related to DKA.  CT head negative. He is alert and conversant today.  Resolved.    5-Mild leukocytosis: Monitor Hyperlipidemia: resume Lipitor.    Hypokalemia; replaced prior to discharge. He will be discharge on potassium supplement.

## 2022-07-10 ENCOUNTER — Encounter: Payer: Self-pay | Admitting: Physician Assistant

## 2022-07-10 ENCOUNTER — Other Ambulatory Visit: Payer: Self-pay

## 2022-07-10 ENCOUNTER — Ambulatory Visit: Payer: 59 | Attending: Family Medicine | Admitting: Physician Assistant

## 2022-07-10 VITALS — BP 119/76 | HR 98 | Wt 184.6 lb

## 2022-07-10 DIAGNOSIS — E876 Hypokalemia: Secondary | ICD-10-CM

## 2022-07-10 DIAGNOSIS — Z09 Encounter for follow-up examination after completed treatment for conditions other than malignant neoplasm: Secondary | ICD-10-CM

## 2022-07-10 DIAGNOSIS — E1165 Type 2 diabetes mellitus with hyperglycemia: Secondary | ICD-10-CM

## 2022-07-10 DIAGNOSIS — Z794 Long term (current) use of insulin: Secondary | ICD-10-CM

## 2022-07-10 LAB — POCT URINALYSIS DIP (CLINITEK)
Bilirubin, UA: NEGATIVE
Blood, UA: NEGATIVE
Glucose, UA: 500 mg/dL — AB
Ketones, POC UA: NEGATIVE mg/dL
Leukocytes, UA: NEGATIVE
Nitrite, UA: NEGATIVE
POC PROTEIN,UA: NEGATIVE
Spec Grav, UA: 1.01 (ref 1.010–1.025)
Urobilinogen, UA: 0.2 E.U./dL
pH, UA: 5.5 (ref 5.0–8.0)

## 2022-07-10 LAB — POCT GLYCOSYLATED HEMOGLOBIN (HGB A1C): HbA1c, POC (controlled diabetic range): 10.6 % — AB (ref 0.0–7.0)

## 2022-07-10 LAB — GLUCOSE, POCT (MANUAL RESULT ENTRY)
POC Glucose: 472 mg/dl — AB (ref 70–99)
POC Glucose: 328 mg/dl — AB (ref 70–99)

## 2022-07-10 MED ORDER — INSULIN PEN NEEDLE 32G X 4 MM MISC
0 refills | Status: DC
  Filled 2022-07-10: qty 100, fill #0

## 2022-07-10 MED ORDER — NOVOLOG FLEXPEN 100 UNIT/ML ~~LOC~~ SOPN
14.0000 [IU] | PEN_INJECTOR | Freq: Three times a day (TID) | SUBCUTANEOUS | 3 refills | Status: DC
  Filled 2022-07-10: qty 15, fill #0
  Filled 2022-08-28: qty 15, 36d supply, fill #0

## 2022-07-10 MED ORDER — INSULIN ASPART 100 UNIT/ML IJ SOLN
20.0000 [IU] | Freq: Once | INTRAMUSCULAR | Status: AC
  Administered 2022-07-10: 20 [IU] via SUBCUTANEOUS

## 2022-07-10 NOTE — Patient Instructions (Signed)
Check blood sugars fasting and bedtime and record and bring to next visit.    Start taking your long acting insulin 20 units daily as directed.

## 2022-08-21 ENCOUNTER — Ambulatory Visit: Payer: 59 | Admitting: Pharmacist

## 2022-08-28 ENCOUNTER — Other Ambulatory Visit: Payer: Self-pay

## 2022-08-28 ENCOUNTER — Other Ambulatory Visit (HOSPITAL_COMMUNITY): Payer: Self-pay

## 2022-08-28 MED ORDER — LANTUS SOLOSTAR 100 UNIT/ML ~~LOC~~ SOPN
20.0000 [IU] | PEN_INJECTOR | Freq: Every day | SUBCUTANEOUS | 3 refills | Status: DC
  Filled 2022-08-28: qty 6, 30d supply, fill #0
  Filled 2022-11-27: qty 6, 30d supply, fill #1
  Filled 2022-12-26 – 2023-01-08 (×2): qty 6, 30d supply, fill #2
  Filled 2023-02-19: qty 6, 30d supply, fill #3
  Filled 2023-03-13: qty 6, 30d supply, fill #4
  Filled 2023-04-29: qty 6, 30d supply, fill #5

## 2022-08-28 MED ORDER — INSULIN LISPRO (1 UNIT DIAL) 100 UNIT/ML (KWIKPEN)
14.0000 [IU] | PEN_INJECTOR | Freq: Three times a day (TID) | SUBCUTANEOUS | 3 refills | Status: DC
  Filled 2022-08-28: qty 15, 36d supply, fill #0

## 2022-08-28 MED ORDER — INSULIN LISPRO PROT & LISPRO (75-25 MIX) 100 UNIT/ML KWIKPEN
14.0000 [IU] | PEN_INJECTOR | Freq: Three times a day (TID) | SUBCUTANEOUS | 3 refills | Status: DC
  Filled 2022-08-28: qty 15, 36d supply, fill #0

## 2022-08-28 NOTE — Telephone Encounter (Signed)
Medications needed to be changed to what his insurance covers

## 2022-09-01 ENCOUNTER — Other Ambulatory Visit: Payer: Self-pay

## 2022-09-03 ENCOUNTER — Other Ambulatory Visit: Payer: Self-pay

## 2022-09-27 ENCOUNTER — Other Ambulatory Visit (HOSPITAL_COMMUNITY): Payer: Self-pay

## 2022-09-29 ENCOUNTER — Other Ambulatory Visit: Payer: Self-pay

## 2022-09-29 MED ORDER — LANTUS SOLOSTAR 100 UNIT/ML ~~LOC~~ SOPN
20.0000 [IU] | PEN_INJECTOR | Freq: Every day | SUBCUTANEOUS | 0 refills | Status: DC
  Filled 2022-09-29: qty 6, 30d supply, fill #0

## 2022-10-06 ENCOUNTER — Other Ambulatory Visit: Payer: Self-pay

## 2022-10-14 ENCOUNTER — Encounter: Payer: Self-pay | Admitting: Family Medicine

## 2022-10-14 ENCOUNTER — Ambulatory Visit: Payer: 59 | Attending: Family Medicine | Admitting: Family Medicine

## 2022-10-14 ENCOUNTER — Other Ambulatory Visit: Payer: Self-pay

## 2022-10-14 ENCOUNTER — Other Ambulatory Visit: Payer: Self-pay | Admitting: Pharmacist

## 2022-10-14 VITALS — BP 107/68 | HR 94 | Temp 98.8°F | Ht 74.0 in | Wt 195.2 lb

## 2022-10-14 DIAGNOSIS — Z794 Long term (current) use of insulin: Secondary | ICD-10-CM

## 2022-10-14 DIAGNOSIS — E1165 Type 2 diabetes mellitus with hyperglycemia: Secondary | ICD-10-CM

## 2022-10-14 DIAGNOSIS — Z1211 Encounter for screening for malignant neoplasm of colon: Secondary | ICD-10-CM

## 2022-10-14 LAB — GLUCOSE, POCT (MANUAL RESULT ENTRY): POC Glucose: 95 mg/dl (ref 70–99)

## 2022-10-14 LAB — POCT GLYCOSYLATED HEMOGLOBIN (HGB A1C): HbA1c, POC (controlled diabetic range): 10.4 % — AB (ref 0.0–7.0)

## 2022-10-14 MED ORDER — INSULIN LISPRO (1 UNIT DIAL) 100 UNIT/ML (KWIKPEN)
20.0000 [IU] | PEN_INJECTOR | Freq: Three times a day (TID) | SUBCUTANEOUS | 3 refills | Status: DC
  Filled 2022-10-14 – 2022-11-27 (×2): qty 15, 25d supply, fill #0
  Filled 2023-04-03: qty 15, 25d supply, fill #1
  Filled 2023-04-29: qty 15, 25d supply, fill #2
  Filled 2023-10-09: qty 15, 25d supply, fill #3

## 2022-10-14 NOTE — Progress Notes (Signed)
Subjective:  Patient ID: Stanley Edwards, male    DOB: Jun 24, 1966  Age: 57 y.o. MRN: [id number]  CC: Diabetes   HPI Stanley Edwards is a 57 y.o. year old male with a history of Type 2 Diabetes Mellitus (A1c 10.5 ), recurrent hospitalizations for DKA.   Interval History:  He administers 20 units of Novolog tid and Lantus 20 units qhs since his hospitalization last month. He was recently admitted at [facility] for 4 days for DKA in 08/2022.  DKA felt to be secondary to noncompliance.  He was treated with IV fluids, IV insulin which was subsequently transitioned to subcutaneous insulin.  Since discharge she endorses adjusting his diet and is eating lots of fruits and has cut out junk foods.  Fasting blood sugars in the low 100s he denies hypoglycemia.  He has no chest pain or dyspnea. Denies presence of additional concerns today. Past Medical History:  Diagnosis Date   Allergy    Diabetes mellitus without complication ([place])    Hypertension    Pt denies htn but it was on his previous hx    No past surgical history on file.  Family History  Problem Relation Age of Onset   Cancer Mother    Diabetes Father     Social History   Socioeconomic History   Marital status: Single    Spouse name: Not on file   Number of children: Not on file   Years of education: Not on file   Highest education level: Not on file  Occupational History   Not on file  Tobacco Use   Smoking status: Former    Types: Cigarettes   Smokeless tobacco: Never  Substance and Sexual Activity   Alcohol use: Yes    Alcohol/week: 1.0 standard drink of alcohol    Types: 1 Cans of beer per week   Drug use: No   Sexual activity: Yes  Other Topics Concern   Not on file  Social History Narrative   Not on file   Social Determinants of Health   Financial Resource Strain: Not on file  Food Insecurity: No Food Insecurity (06/30/2022)   Hunger Vital Sign    Worried About Running Out of Food in the Last Year:  Never true    Ran Out of Food in the Last Year: Never true  Transportation Needs: No Transportation Needs (06/30/2022)   PRAPARE - Hydrologist (Medical): No    Lack of Transportation (Non-Medical): No  Physical Activity: Not on file  Stress: Not on file  Social Connections: Not on file    No Known Allergies  Outpatient Medications Prior to Visit  Medication Sig Dispense Refill   atorvastatin (LIPITOR) 40 MG tablet Take 1 tablet (40 mg total) by mouth daily. 30 tablet 2   Blood Glucose Monitoring Suppl (TRUE METRIX METER) DEVI 1 each by Does not apply route 3 (three) times daily before meals. 1 each 0   glucose blood (TRUE METRIX BLOOD GLUCOSE TEST) test strip Use 3 times daily before meals 100 each 12   insulin glargine (LANTUS SOLOSTAR) 100 UNIT/ML Solostar Pen Inject 20 Units into the skin daily. 15 mL 3   Insulin Pen Needle 32G X 4 MM MISC Use to inject insulin 4 times daily. 100 each 0   insulin aspart (NOVOLOG FLEXPEN) 100 UNIT/ML FlexPen Inject 14 Units into the skin 3 (three) times daily with meals. 15 mL 3   insulin glargine (LANTUS SOLOSTAR) 100  UNIT/ML Solostar Pen Inject 20 Units into the skin daily. 15 mL 0   insulin lispro (HUMALOG) 100 UNIT/ML KwikPen Inject 14 Units into the skin 3 (three) times daily before meals. (Patient not taking: Reported on 10/14/2022) 15 mL 3   Insulin Lispro Prot & Lispro (HUMALOG MIX 75/25 KWIKPEN) (75-25) 100 UNIT/ML Kwikpen Inject 14 Units into the skin 3 (three) times daily. (Patient not taking: Reported on 10/14/2022) 15 mL 3   potassium chloride SA (KLOR-CON M) 20 MEQ tablet Take 2 tablets (40 mEq total) by mouth daily for 4 days. 8 tablet 0   No facility-administered medications prior to visit.     ROS Review of Systems  Constitutional:  Negative for activity change and appetite change.  HENT:  Negative for sinus pressure and sore throat.   Respiratory:  Negative for chest tightness, shortness of breath and  wheezing.   Cardiovascular:  Negative for chest pain and palpitations.  Gastrointestinal:  Negative for abdominal distention, abdominal pain and constipation.  Genitourinary: Negative.   Musculoskeletal: Negative.   Psychiatric/Behavioral:  Negative for behavioral problems and dysphoric mood.     Objective:  BP 107/68   Pulse 94   Temp 98.8 F (37.1 C) (Oral)   Ht 6\' 2"  (1.88 m)   Wt 195 lb 3.2 oz (88.5 kg)   SpO2 100%   BMI 25.06 kg/m      10/14/2022   10:27 AM 07/10/2022   10:51 AM 06/27/2022    8:58 AM  BP/Weight  Systolic BP [id number]  Diastolic BP 68 76 69  Wt. (Lbs) 195.2 184.6   BMI 25.06 kg/m2 23.7 kg/m2       Physical Exam Constitutional:      Appearance: He is well-developed.  Cardiovascular:     Rate and Rhythm: Normal rate.     Heart sounds: Normal heart sounds. No murmur heard. Pulmonary:     Effort: Pulmonary effort is normal.     Breath sounds: Normal breath sounds. No wheezing or rales.  Chest:     Chest wall: No tenderness.  Abdominal:     General: Bowel sounds are normal. There is no distension.     Palpations: Abdomen is soft. There is no mass.     Tenderness: There is no abdominal tenderness.  Musculoskeletal:        General: Normal range of motion.     Right lower leg: No edema.     Left lower leg: No edema.  Neurological:     Mental Status: He is alert and oriented to person, place, and time.  Psychiatric:        Mood and Affect: Mood normal.        Latest Ref Rng & Units 06/27/2022   12:56 AM 06/26/2022    1:28 AM 06/25/2022   12:23 PM  CMP  Glucose 70 - 99 mg/dL 143  135  142   BUN 6 - 20 mg/dL 21  31  40   Creatinine 0.61 - 1.24 mg/dL 1.24  1.69  2.15   Sodium 135 - 145 mmol/L 141  154  159   Potassium 3.5 - 5.1 mmol/L 2.9  3.6  3.7   Chloride 98 - 111 mmol/L 106  116  120   CO2 22 - 32 mmol/L 25  25  24    Calcium 8.9 - 10.3 mg/dL 8.3  9.3  9.9     Lipid Panel     Component Value Date/Time   CHOL 232 (H)  06/26/2022  0128   CHOL 347 (H) 08/01/2021 1202   TRIG 92 06/26/2022 0128   HDL 40 (L) 06/26/2022 0128   HDL 77 08/01/2021 1202   CHOLHDL 5.8 06/26/2022 0128   VLDL 18 06/26/2022 0128   LDLCALC 174 (H) 06/26/2022 0128   LDLCALC 254 (H) 08/01/2021 1202    CBC    Component Value Date/Time   WBC 11.1 (H) 06/25/2022 0630   RBC 3.79 (L) 06/25/2022 0630   HGB 13.2 06/25/2022 0630   HGB 11.8 (L) 11/26/2021 1122   HCT 40.3 06/25/2022 0630   HCT 35.9 (L) 11/26/2021 1122   PLT 285 06/25/2022 0630   PLT 313 11/26/2021 1122   MCV 106.3 (H) 06/25/2022 0630   MCV 107 (H) 11/26/2021 1122   MCH 34.8 (H) 06/25/2022 0630   MCHC 32.8 06/25/2022 0630   RDW 11.2 (L) 06/25/2022 0630   RDW 10.9 (L) 11/26/2021 1122   LYMPHSABS 1.0 06/25/2022 0229   LYMPHSABS 1.4 11/26/2021 1122   MONOABS 0.4 06/25/2022 0229   EOSABS 0.0 06/25/2022 0229   EOSABS 0.3 11/26/2021 1122   BASOSABS 0.0 06/25/2022 0229   BASOSABS 0.1 11/26/2021 1122    Lab Results  Component Value Date   HGBA1C 10.4 (A) 10/14/2022    Assessment & Plan:  1. Type 2 diabetes mellitus with hyperglycemia, with long-term current use of insulin (HCC) Uncontrolled with A1c of 10.4 Blood sugar log reveals improvement Will not make changes to regimen as he recently had regimen change during his last hospitalization less than a month ago A1c is due again in 3 months hopefully there will be some improvement He has been commended on his recent lifestyle modifications Counseled on Diabetic diet, my plate method, [id number] minutes of moderate intensity exercise/week Blood sugar logs with fasting goals of 80-120 mg/dl, random of less than 180 and in the event of sugars less than 60 mg/dl or greater than 400 mg/dl encouraged to notify the clinic. Advised on the need for annual eye exams, annual foot exams, Pneumonia vaccine.  - POCT glucose (manual entry) - POCT glycosylated hemoglobin (Hb A1C) - Ambulatory referral to Ophthalmology - Microalbumin/Creatinine  Ratio, Urine - CMP14+EGFR  2. Screening for colon cancer - Ambulatory referral to Gastroenterology   No orders of the defined types were placed in this encounter.   Follow-up: Return in about 3 months (around 01/14/2023) for Chronic medical conditions.       Charlott Rakes, MD, FAAFP. [facility] [place], [place]   10/14/2022, 12:20 PM

## 2022-10-14 NOTE — Patient Instructions (Signed)

## 2022-10-15 ENCOUNTER — Telehealth: Payer: Self-pay

## 2022-10-15 LAB — MICROALBUMIN / CREATININE URINE RATIO
Creatinine, Urine: 223.4 mg/dL
Microalb/Creat Ratio: 8 mg/g creat (ref 0–29)
Microalbumin, Urine: 17.2 ug/mL

## 2022-10-15 LAB — CMP14+EGFR
ALT: 29 IU/L (ref 0–44)
AST: 21 IU/L (ref 0–40)
Albumin/Globulin Ratio: 1.7 (ref 1.2–2.2)
Albumin: 4 g/dL (ref 3.8–4.9)
Alkaline Phosphatase: 97 IU/L (ref 44–121)
BUN/Creatinine Ratio: 7 — ABNORMAL LOW (ref 9–20)
BUN: 8 mg/dL (ref 6–24)
Bilirubin Total: 0.5 mg/dL (ref 0.0–1.2)
CO2: 21 mmol/L (ref 20–29)
Calcium: 9 mg/dL (ref 8.7–10.2)
Chloride: 102 mmol/L (ref 96–106)
Creatinine, Ser: 1.15 mg/dL (ref 0.76–1.27)
Globulin, Total: 2.4 g/dL (ref 1.5–4.5)
Glucose: 36 mg/dL — CL (ref 70–99)
Potassium: 3.7 mmol/L (ref 3.5–5.2)
Sodium: 139 mmol/L (ref 134–144)
Total Protein: 6.4 g/dL (ref 6.0–8.5)
eGFR: 74 mL/min/{1.73_m2} (ref >59)

## 2022-10-15 NOTE — Telephone Encounter (Signed)
Patient was called and informed of lab results. Patient states that he does not check his blood sugar and he does not have a meter at home.

## 2022-10-15 NOTE — Telephone Encounter (Signed)
Can you please send the appropriate glucometer covered by his pharmacy for him and have him pick this up ASAP because he is on NovoLog he has to check his blood sugar 3 times daily? Thank you.

## 2022-10-16 ENCOUNTER — Other Ambulatory Visit: Payer: Self-pay

## 2022-10-16 MED ORDER — ONETOUCH DELICA PLUS LANCET33G MISC
11 refills | Status: DC
  Filled 2022-10-16: qty 100, 30d supply, fill #0

## 2022-10-16 MED ORDER — ONETOUCH VERIO W/DEVICE KIT
PACK | 0 refills | Status: DC
  Filled 2022-10-16: qty 1, 30d supply, fill #0

## 2022-10-16 MED ORDER — ONETOUCH VERIO VI STRP
ORAL_STRIP | 12 refills | Status: DC
  Filled 2022-10-16: qty 100, 30d supply, fill #0

## 2022-10-16 NOTE — Telephone Encounter (Signed)
Patient was called and VM was left informing patient of testing supplies and medication adjustment.

## 2022-10-21 ENCOUNTER — Other Ambulatory Visit: Payer: Self-pay

## 2022-10-23 ENCOUNTER — Other Ambulatory Visit: Payer: Self-pay

## 2022-11-27 ENCOUNTER — Other Ambulatory Visit: Payer: Self-pay

## 2022-11-28 ENCOUNTER — Other Ambulatory Visit: Payer: Self-pay

## 2022-12-11 ENCOUNTER — Telehealth: Payer: Self-pay

## 2022-12-11 NOTE — Telephone Encounter (Signed)
   Telephone encounter was:  Unsuccessful.  12/11/2022 Name: Stanley Edwards MRN: [id number] DOB: 1966-05-31  Unsuccessful outbound call made today to assist with:   Colon Cancer Screening  Outreach Attempt:  1st Attempt    Unable to leave a message     Lenard Forth [facility] Guide, [facility] [phone] [address] [place], [place], [place] [address] Phone: [phone] Email: [email or web address]

## 2022-12-12 ENCOUNTER — Telehealth: Payer: Self-pay

## 2022-12-12 NOTE — Telephone Encounter (Signed)
   Telephone encounter was:  Unsuccessful.  12/12/2022 Name: Stanley Edwards MRN: [id number] DOB: 1966-06-04  Unsuccessful outbound call made today to assist with:   Colon Cancer Screening  Outreach Attempt:  2nd Attempt  A HIPAA compliant voice message was left requesting a return call.  Instructed patient to call back .    Lenard Forth [facility] Guide, [facility] [phone] [address] [place], [place], [place] [address] Phone: [phone] Email: [email or web address]

## 2022-12-13 IMAGING — CT CT HEAD W/O CM
4 series · 15 of 47 positions shown, 17 images · non-contrast
Comparison: None.

CLINICAL DATA: Found unresponsive



[Series 3: head without · axial · non-contrast · 0.45mm/px · z∈[+1462,+1582]mm · 7 of 34 slices shown, 9 images]
[im 5/34  brain]
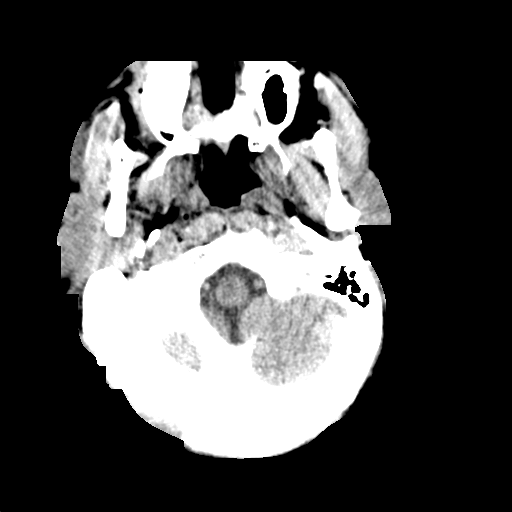
[im 5/34  bone]
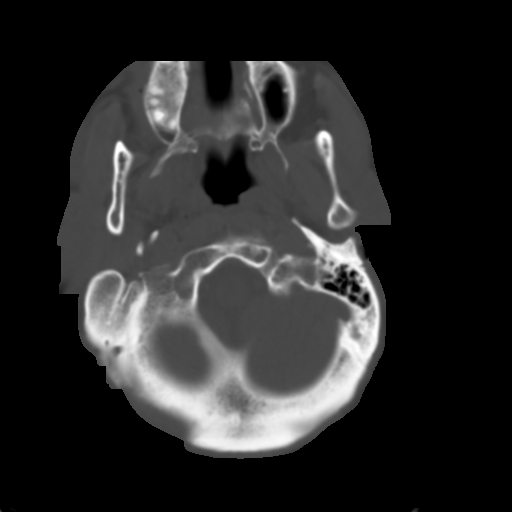
[im 9/34  brain]
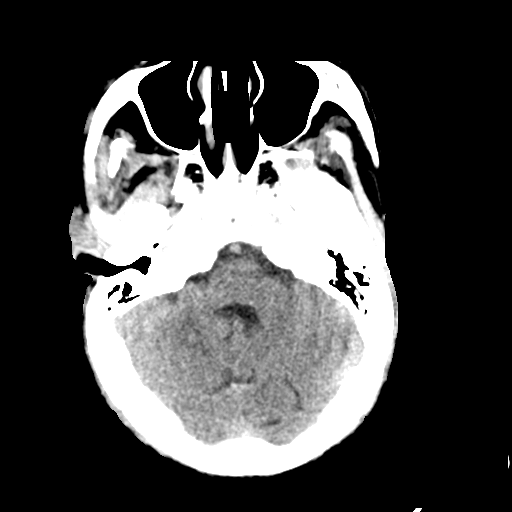
[im 13/34  brain]
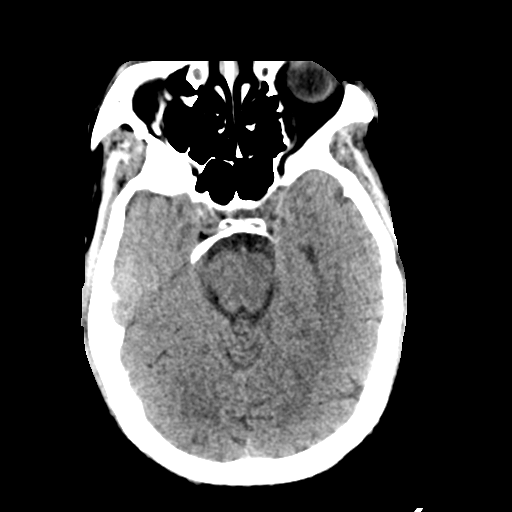
[im 17/34  brain]
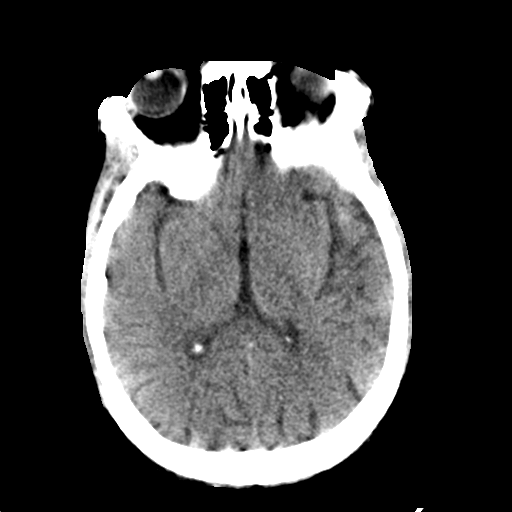
[im 21/34  brain]
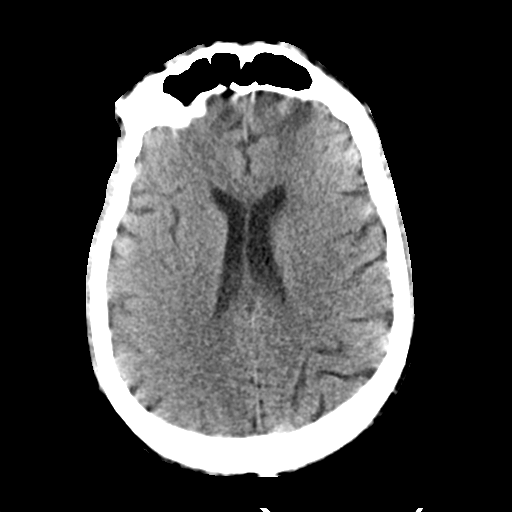
[im 21/34  bone]
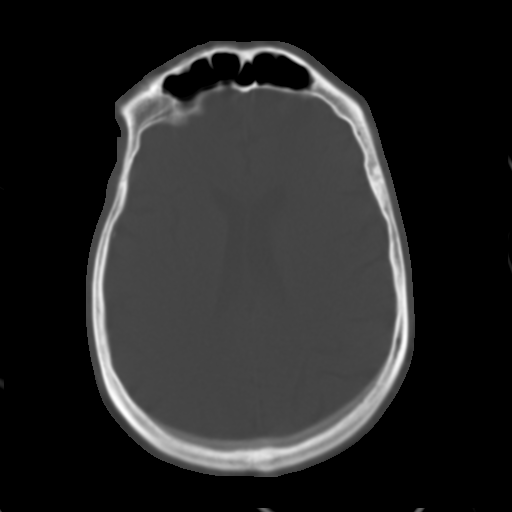
[im 25/34  brain]
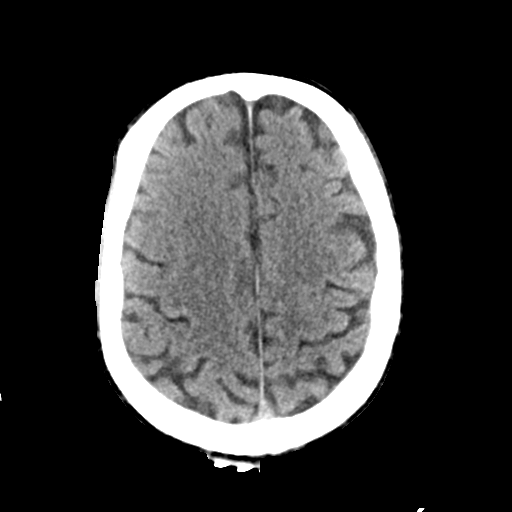
[im 29/34  brain]
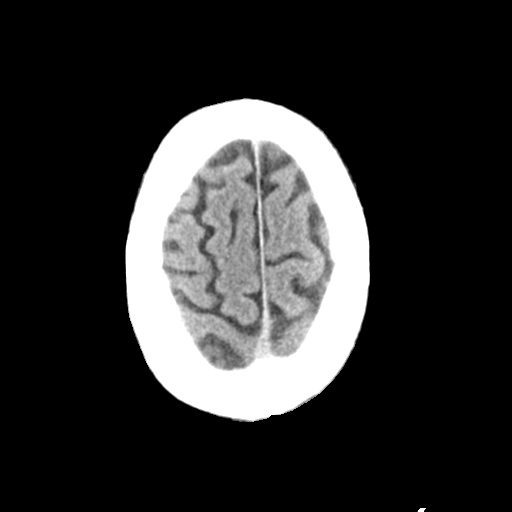

[Series 4: head bone · axial · 0.45mm/px · z∈[+1458,+1474]mm · 2 of 84 slices shown]
[im 9/84  bone]
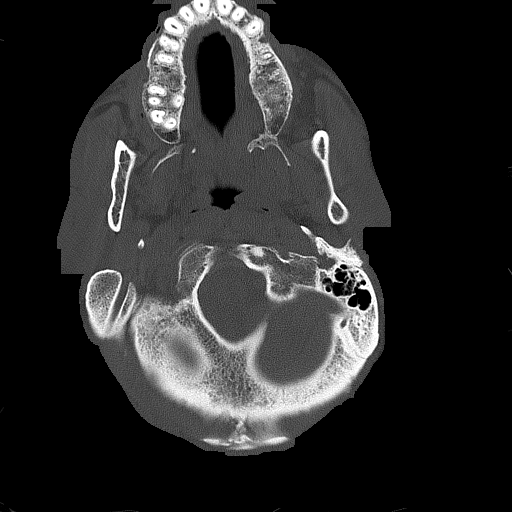
[im 17/84  bone]
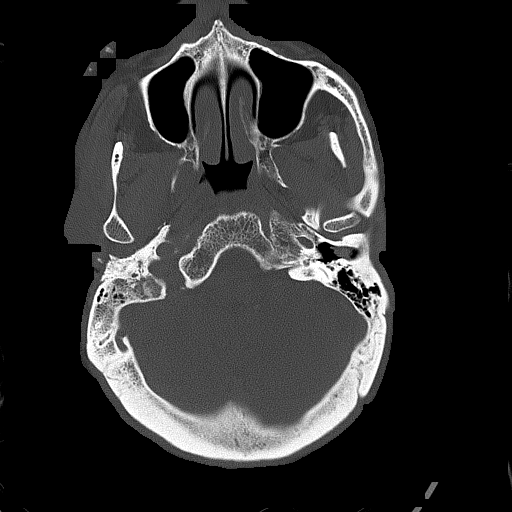

[Series 5: head without cor · coronal · non-contrast · 0.33mm/px · 3 of 83 slices shown]
[im 29/83  brain]
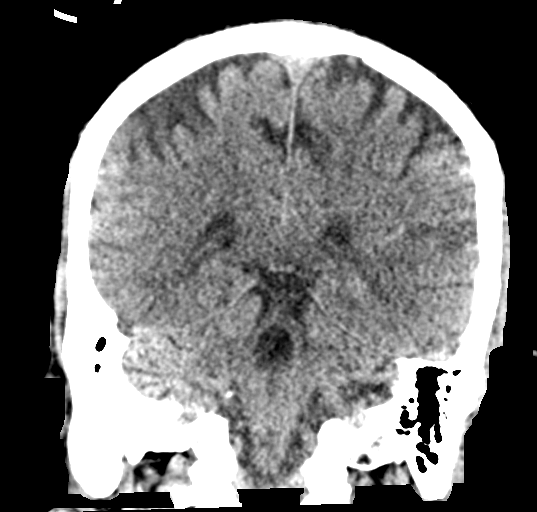
[im 37/83  brain]
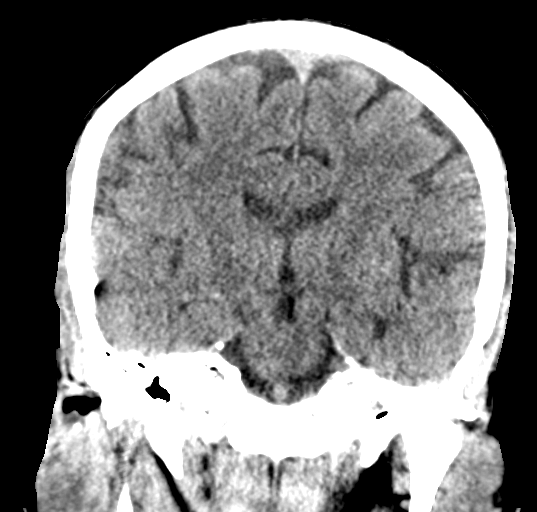
[im 46/83  brain]
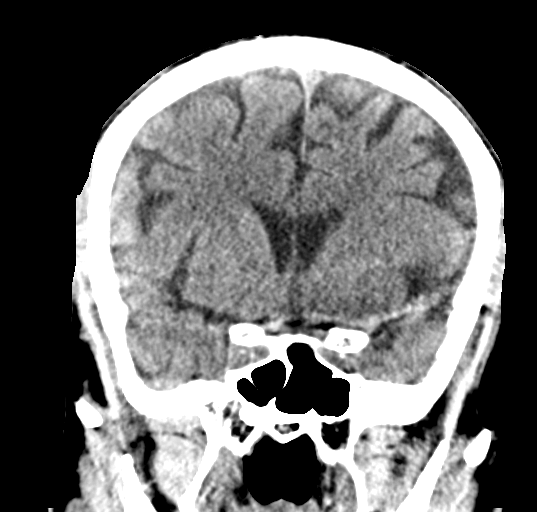

[Series 6: head without sag · sagittal · non-contrast · 0.33mm/px · 3 of 58 slices shown]
[im 20/58  brain]
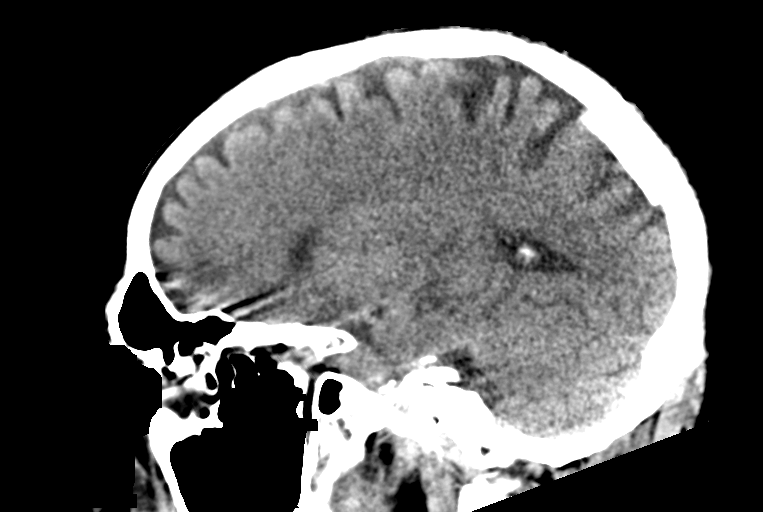
[im 29/58  brain]
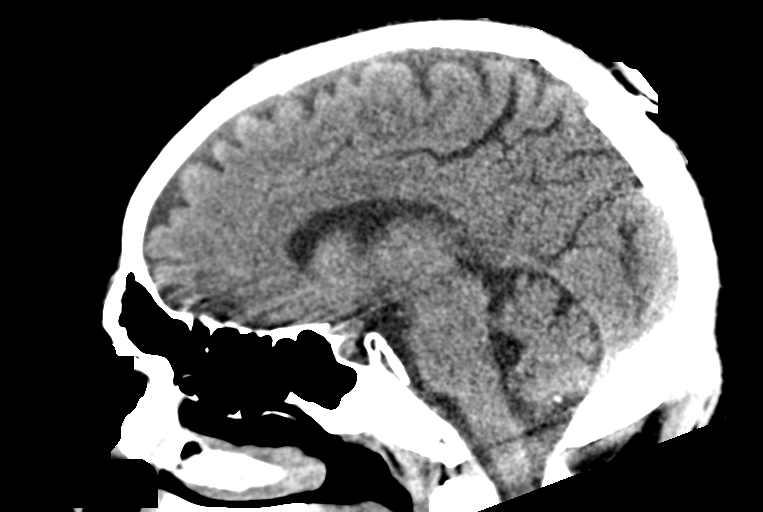
[im 39/58  brain]
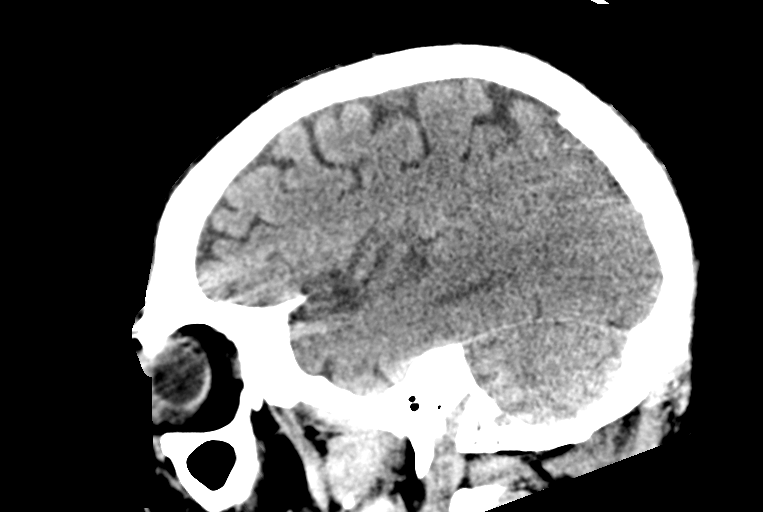

[15 of 47 positions shown; findings below may reference images not displayed]

FINDINGS: CT HEAD FINDINGS

Brain: There is no mass, hemorrhage or extra-axial collection. The
size and configuration of the ventricles and extra-axial CSF spaces
are normal. The brain parenchyma is normal, without evidence of
acute or chronic infarction.

Vascular: No abnormal hyperdensity of the major intracranial
arteries or dural venous sinuses. No intracranial atherosclerosis.

Skull: The visualized skull base, calvarium and extracranial soft
tissues are normal.

Sinuses/Orbits: No fluid levels or advanced mucosal thickening of
the visualized paranasal sinuses. No mastoid or middle ear effusion.
The orbits are normal.

CT CERVICAL SPINE FINDINGS

Alignment: No static subluxation. Facets are aligned. Occipital
condyles are normally positioned.

Skull base and vertebrae: No acute fracture.

Soft tissues and spinal canal: No prevertebral fluid or swelling. No
visible canal hematoma.

Disc levels: No advanced spinal canal or neural foraminal stenosis.

Upper chest: No pneumothorax, pulmonary nodule or pleural effusion.

Other: There is gas in the right neck soft tissues tracking
superiorly in the right carotid space.
IMPRESSION: 1. No acute intracranial abnormality.
2. No acute fracture or static subluxation of the cervical spine.
3. Gas in the right neck soft tissues tracking superiorly in the
right carotid space, of unclear etiology.

## 2022-12-15 ENCOUNTER — Telehealth: Payer: Self-pay

## 2022-12-15 NOTE — Telephone Encounter (Signed)
   Telephone encounter was:  Unsuccessful.  12/15/2022 Name: Stanley Edwards MRN: [id number] DOB: Mar 20, 1966  Unsuccessful outbound call made today to assist with:   Colorectal Cancer Screening  Outreach Attempt:  3rd Attempt.  Referral closed unable to contact patient.  A HIPAA compliant voice message was left requesting a return call.  Instructed patient to call back .    Lenard Forth [facility] Guide, [facility] [phone] [address] [place], [place], [place] [address] Phone: [phone] Email: [email or web address]

## 2022-12-26 ENCOUNTER — Other Ambulatory Visit: Payer: Self-pay

## 2023-01-02 ENCOUNTER — Other Ambulatory Visit: Payer: Self-pay

## 2023-01-08 ENCOUNTER — Other Ambulatory Visit: Payer: Self-pay

## 2023-01-13 ENCOUNTER — Ambulatory Visit: Payer: Commercial Managed Care - HMO | Admitting: Family Medicine

## 2023-01-20 ENCOUNTER — Ambulatory Visit: Payer: Self-pay | Admitting: Family Medicine

## 2023-01-26 ENCOUNTER — Encounter: Payer: Self-pay | Admitting: Internal Medicine

## 2023-01-26 ENCOUNTER — Ambulatory Visit: Payer: Commercial Managed Care - HMO | Admitting: Internal Medicine

## 2023-01-26 ENCOUNTER — Other Ambulatory Visit: Payer: Self-pay

## 2023-01-26 ENCOUNTER — Ambulatory Visit: Payer: Self-pay | Admitting: *Deleted

## 2023-01-26 DIAGNOSIS — E11649 Type 2 diabetes mellitus with hypoglycemia without coma: Secondary | ICD-10-CM | POA: Diagnosis not present

## 2023-01-26 DIAGNOSIS — Z794 Long term (current) use of insulin: Secondary | ICD-10-CM

## 2023-01-26 LAB — POCT GLYCOSYLATED HEMOGLOBIN (HGB A1C): HbA1c, POC (controlled diabetic range): 8.4 % — AB (ref 0.0–7.0)

## 2023-01-26 LAB — GLUCOSE, POCT (MANUAL RESULT ENTRY): POC Glucose: 237 mg/dl — AB (ref 70–99)

## 2023-01-26 MED ORDER — DEXCOM G7 SENSOR MISC
6 refills | Status: DC
  Filled 2023-01-26: qty 3, 30d supply, fill #0

## 2023-01-26 MED ORDER — ATORVASTATIN CALCIUM 40 MG PO TABS
40.0000 mg | ORAL_TABLET | Freq: Every day | ORAL | 2 refills | Status: DC
  Filled 2023-01-26: qty 30, 30d supply, fill #0

## 2023-01-26 NOTE — Progress Notes (Signed)
Patient ID: ARASH HEAP, male    DOB: 1965-08-08  MRN: [id number]  CC: Forms (Forms. Med refills. )   Subjective: Kylon Kumpf is a 57 y.o. male who presents for UC. 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  He works a 12-hour shift from 7 PM to 7 AM. Last Thursday, while performing his duties he felt a little dizzy.  His supervisor called 911 because he did not look good.  Blood sugar was found to be 30.  Given a sandwich and [facility] with subsequent increase in his blood sugar above 100.  He then went home. Reports no previous episodes like this.  He thinks this occurred because he took his NovoLog insulin 20 units about 2 hours prior and did not eat anything. Currently on Lantus insulin 20 units once a day and NovoLog 20 units 3 times a day with meals. Checks blood sugars twice a day 2 hours after breakfast and dinner.  Range after breakfast 115-145 and 130-135 after dinner. Patient Active Problem List   Diagnosis Date Noted   DKA (diabetic ketoacidosis) (HCC) 02/21/2022   Malnutrition of moderate degree 11/06/2021   Protein-calorie malnutrition, moderate (HCC) 11/06/2021   Hypernatremia 11/03/2021   Acute metabolic encephalopathy 11/02/2021   Hyperkalemia 03/21/2019   Hypertension 01/01/2017   AKI (acute kidney injury) (HCC) 03/24/2016   Hyperlipidemia 09/24/2015    DM2 (diabetes mellitus, type 2) (HCC) 09/19/2015   Hypokalemia 09/19/2015   Diabetic ketoacidosis without coma associated with type 2 diabetes mellitus (HCC) 09/16/2015     Current Outpatient Medications on File Prior to Visit  Medication Sig Dispense Refill   Blood Glucose Monitoring Suppl (ONETOUCH VERIO) w/Device KIT Use to check blood sugar 3 times daily 1 kit 0   glucose blood (ONETOUCH VERIO) test strip Use as instructed 100 each 12   insulin glargine (LANTUS SOLOSTAR) 100 UNIT/ML Solostar Pen Inject 20 Units into the skin daily. 15 mL 3   insulin lispro (HUMALOG) 100 UNIT/ML KwikPen Inject 20 Units into the skin 3 (three) times daily before meals. 15 mL 3   Insulin Pen Needle 32G X 4 MM MISC Use to inject insulin 4 times daily. 100 each 0   Lancets (ONETOUCH DELICA PLUS LANCET33G) MISC Use to check blood sugar 3 times a day 100 each 11   No current facility-administered medications on file prior to visit.    No Known Allergies  Social History   Socioeconomic History   Marital status: Single    Spouse name: Not on file   Number of children: Not on file   Years of education: Not on file   Highest education level: Not on file  Occupational History   Not on file  Tobacco Use  Smoking status: Former    Types: Cigarettes   Smokeless tobacco: Never  Substance and Sexual Activity   Alcohol use: Yes    Alcohol/week: 1.0 standard drink of alcohol    Types: 1 Cans of beer per week   Drug use: No   Sexual activity: Yes  Other Topics Concern   Not on file  Social History Narrative   Not on file   Social Determinants of Health   Financial Resource Strain: Not on file  Food Insecurity: No Food Insecurity (06/30/2022)   Hunger Vital Sign    Worried About Running Out of Food in the Last Year: Never true    Ran Out of Food in the Last Year: Never true  Transportation Needs: No Transportation Needs (06/30/2022)   PRAPARE - Administrator, Civil Service (Medical):  No    Lack of Transportation (Non-Medical): No  Physical Activity: Not on file  Stress: Not on file  Social Connections: Not on file  Intimate Partner Violence: Not on file    Family History  Problem Relation Age of Onset   Cancer Mother    Diabetes Father     No past surgical history on file.  ROS: Review of Systems Negative except as stated above  PHYSICAL EXAM: BP 113/70 (BP Location: Left Arm, Patient Position: Sitting, Cuff Size: Normal)   Pulse 93   Temp 98.3 F (36.8 C) (Oral)   Ht 6\' 2"  (1.88 m)   Wt 195 lb (88.5 kg)   SpO2 99%   BMI 25.04 kg/m   Physical Exam   General appearance - alert, well appearing, older African-American male and in no distress Mental status - normal mood, behavior, speech, dress, motor activity, and thought processes Chest - clear to auscultation, no wheezes, rales or rhonchi, symmetric air entry Heart - normal rate, regular rhythm, normal S1, S2, no murmurs, rubs, clicks or gallops     Latest Ref Rng & Units 10/14/2022   11:25 AM 06/27/2022   12:56 AM 06/26/2022    1:28 AM  CMP  Glucose 70 - 99 mg/dL 36  161  096   BUN 6 - 24 mg/dL 8  21  31    Creatinine 0.76 - 1.27 mg/dL 0.45  4.09  8.11   Sodium 134 - 144 mmol/L 139  141  154   Potassium 3.5 - 5.2 mmol/L 3.7  2.9  3.6   Chloride 96 - 106 mmol/L 102  106  116   CO2 20 - 29 mmol/L 21  25  25    Calcium 8.7 - 10.2 mg/dL 9.0  8.3  9.3   Total Protein 6.0 - 8.5 g/dL 6.4     Total Bilirubin 0.0 - 1.2 mg/dL 0.5     Alkaline Phos 44 - 121 IU/L 97     AST 0 - 40 IU/L 21     ALT 0 - 44 IU/L 29      Lipid Panel     Component Value Date/Time   CHOL 232 (H) 06/26/2022 0128   CHOL 347 (H) 08/01/2021 1202   TRIG 92 06/26/2022 0128   HDL 40 (L) 06/26/2022 0128   HDL 77 08/01/2021 1202   CHOLHDL 5.8 06/26/2022 0128   VLDL 18 06/26/2022 0128   LDLCALC 174 (H) 06/26/2022 0128   LDLCALC 254 (H) 08/01/2021 1202    CBC    Component Value Date/Time   WBC 11.1 (H) 06/25/2022 0630    RBC 3.79 (L) 06/25/2022 0630  HGB 13.2 06/25/2022 0630   HGB 11.8 (L) 11/26/2021 1122   HCT 40.3 06/25/2022 0630   HCT 35.9 (L) 11/26/2021 1122   PLT 285 06/25/2022 0630   PLT 313 11/26/2021 1122   MCV 106.3 (H) 06/25/2022 0630   MCV 107 (H) 11/26/2021 1122   MCH 34.8 (H) 06/25/2022 0630   MCHC 32.8 06/25/2022 0630   RDW 11.2 (L) 06/25/2022 0630   RDW 10.9 (L) 11/26/2021 1122   LYMPHSABS 1.0 06/25/2022 0229   LYMPHSABS 1.4 11/26/2021 1122   MONOABS 0.4 06/25/2022 0229   EOSABS 0.0 06/25/2022 0229   EOSABS 0.3 11/26/2021 1122   BASOSABS 0.0 06/25/2022 0229   BASOSABS 0.1 11/26/2021 1122    ASSESSMENT AND PLAN:  1. Type 2 diabetes mellitus with hypoglycemia without coma, with long-term current use of insulin (HCC) Patient with reported hypoglycemic episode while at work last week that occurred in the setting of him taking 20 units of mealtime insulin but not eating anything after taking the NovoLog.  Informed patient of the importance of eating within 5 minutes of taking his mealtime insulin shots.  He expressed understanding. -He is agreeable to being prescribed Dexcom.  Reports that he had it in the past and found it useful.  Informed him that this will alarm if blood sugars drop below. -A1c has improved by 2 points since it was last checked by [place] in March. Follow-up with clinical pharmacist in 1 month. I have released him to return to work with no restrictions.  Patient states that he is due back at work 01/28/2023.  Form completed and given to patient.  Copy For our records. - POCT glucose (manual entry) - POCT glycosylated hemoglobin (Hb A1C) - atorvastatin (LIPITOR) 40 MG tablet; Take 1 tablet (40 mg total) by mouth daily.  Dispense: 30 tablet; Refill: 2 - Continuous Glucose Sensor (DEXCOM G7 SENSOR) MISC; Change sensor Q 10 days  Dispense: 3 each; Refill: 6    Patient was given the opportunity to ask questions.  Patient verbalized understanding of the plan and was able to  repeat key elements of the plan.   This documentation was completed using Paediatric nurse.  Any transcriptional errors are unintentional.  Orders Placed This Encounter  Procedures   POCT glucose (manual entry)   POCT glycosylated hemoglobin (Hb A1C)     Requested Prescriptions   Signed Prescriptions Disp Refills   atorvastatin (LIPITOR) 40 MG tablet 30 tablet 2    Sig: Take 1 tablet (40 mg total) by mouth daily.   Continuous Glucose Sensor (DEXCOM G7 SENSOR) MISC 3 each 6    Sig: Change sensor Q 10 days    Return for Appt with Franky Macho in 4 wks for BS check.  Jonah Blue, MD, FACP

## 2023-01-26 NOTE — Telephone Encounter (Signed)
  Chief Complaint: low glucose- EMS called- Thursday  Symptoms: glucose reading 30 at work Frequency: has only happened once- patient states he has never been that low  Disposition: [] ED /[] Urgent Care (no appt availability in office) / [x] Appointment(In office/virtual)/ []  [facility]/ [] Home Care/ [] Refused Recommended Disposition /[] [facility]/ []  Follow-up with PCP Additional Notes: Patient states he can not return to work until he has paperwork filled out from  provider

## 2023-01-26 NOTE — Telephone Encounter (Signed)
Reason for Disposition  [1] Blood glucose 70  mg/dL (3.9 mmol/L) or below OR symptomatic, now improved with Care Advice AND [2] cause unknown  Answer Assessment - Initial Assessment Questions 1. SYMPTOMS: "What symptoms are you concerned about?"     Low glucose reading Thursday- only day- has come up- in 100's now 2. ONSET:  "When did the symptoms start?"     Thursday- first time ever happened 3. BLOOD GLUCOSE: "What is your blood glucose level?"      30- called EMS- was given intake-food- at work and it can up 4. USUAL RANGE: "What is your blood glucose level usually?" (e.g., usual fasting morning value, usual evening value)     Normal range- in 100's Patient needs paperwork filled out to return to work - can that be done today?  Protocols used: Diabetes - Low Blood Sugar-A-AH

## 2023-01-27 ENCOUNTER — Other Ambulatory Visit: Payer: Self-pay

## 2023-01-28 ENCOUNTER — Other Ambulatory Visit: Payer: Self-pay

## 2023-01-28 ENCOUNTER — Telehealth: Payer: Self-pay

## 2023-01-28 NOTE — Telephone Encounter (Signed)
A prior authorization request for Dexcom G7 sensors was submitted to patient's insurance today via CoverMyMeds Key: [id number]

## 2023-01-30 NOTE — Telephone Encounter (Signed)
APPROVED UNTIL 01/28/2024

## 2023-02-02 ENCOUNTER — Other Ambulatory Visit: Payer: Self-pay

## 2023-02-03 ENCOUNTER — Ambulatory Visit: Payer: Self-pay | Admitting: Family Medicine

## 2023-02-19 ENCOUNTER — Other Ambulatory Visit: Payer: Self-pay

## 2023-02-27 ENCOUNTER — Ambulatory Visit: Payer: Commercial Managed Care - HMO | Admitting: Pharmacist

## 2023-03-13 ENCOUNTER — Other Ambulatory Visit: Payer: Self-pay

## 2023-04-03 ENCOUNTER — Other Ambulatory Visit: Payer: Self-pay

## 2023-04-29 ENCOUNTER — Other Ambulatory Visit: Payer: Self-pay

## 2023-04-29 ENCOUNTER — Other Ambulatory Visit: Payer: Self-pay | Admitting: Family Medicine

## 2023-04-30 ENCOUNTER — Other Ambulatory Visit: Payer: Self-pay

## 2023-04-30 MED ORDER — INSULIN GLARGINE-YFGN 100 UNIT/ML ~~LOC~~ SOPN
20.0000 [IU] | PEN_INJECTOR | Freq: Every day | SUBCUTANEOUS | 2 refills | Status: DC
  Filled 2023-04-30 (×2): qty 6, 30d supply, fill #0
  Filled 2023-06-29: qty 6, 30d supply, fill #1
  Filled 2023-09-04: qty 3, 15d supply, fill #2
  Filled 2023-09-04: qty 6, 30d supply, fill #2
  Filled 2023-09-05 – 2023-09-07 (×2): qty 6, 30d supply, fill #0

## 2023-04-30 NOTE — Telephone Encounter (Signed)
Requested medication (s) are due for refill today: yes  Requested medication (s) are on the active medication list: yes  Last refill:  08/28/22  Future visit scheduled: no  Notes to clinic:  Pharmacy comment: Not covered, please approve or send new rx for [facility]      Requested Prescriptions  Pending Prescriptions Disp Refills   insulin glargine (LANTUS) 100 UNIT/ML Solostar Pen 15 mL 3    Sig: Inject 20 Units into the skin daily.     Endocrinology:  Diabetes - Insulins Failed - 04/29/2023  1:14 PM      Failed - HBA1C is between 0 and 7.9 and within 180 days    HbA1c, POC (controlled diabetic range)  Date Value Ref Range Status  01/26/2023 8.4 (A) 0.0 - 7.0 % Final         Passed - Valid encounter within last 6 months    Recent Outpatient Visits           3 months ago Type 2 diabetes mellitus with hypoglycemia without coma, with long-term current use of insulin [facility])   [place] [facility] Jonah Blue B, MD   6 months ago Type 2 diabetes mellitus with hyperglycemia, with long-term current use of insulin (HCC)   [place] [facility] Tornado, [place], MD   9 months ago Type 2 diabetes mellitus with hyperglycemia, with long-term current use of insulin [facility])   [facility] [place], [place], [place]   1 year ago Type 2 diabetes mellitus with hyperglycemia, with long-term current use of insulin [facility])   [place] [facility] Hoy Register, MD   1 year ago Screening for colon cancer   [place] [facility] Hoy Register, MD

## 2023-05-05 ENCOUNTER — Other Ambulatory Visit: Payer: Self-pay

## 2023-05-11 ENCOUNTER — Other Ambulatory Visit: Payer: Self-pay

## 2023-05-12 ENCOUNTER — Other Ambulatory Visit: Payer: Self-pay

## 2023-06-29 ENCOUNTER — Other Ambulatory Visit: Payer: Self-pay

## 2023-07-09 ENCOUNTER — Other Ambulatory Visit: Payer: Self-pay

## 2023-09-04 ENCOUNTER — Other Ambulatory Visit (HOSPITAL_COMMUNITY): Payer: Self-pay

## 2023-09-04 ENCOUNTER — Other Ambulatory Visit: Payer: Self-pay

## 2023-09-07 ENCOUNTER — Other Ambulatory Visit (HOSPITAL_COMMUNITY): Payer: Self-pay

## 2023-10-09 ENCOUNTER — Other Ambulatory Visit: Payer: Self-pay

## 2023-10-09 ENCOUNTER — Other Ambulatory Visit: Payer: Self-pay | Admitting: Family Medicine

## 2023-10-09 MED ORDER — INSULIN GLARGINE-YFGN 100 UNIT/ML ~~LOC~~ SOPN
20.0000 [IU] | PEN_INJECTOR | Freq: Every day | SUBCUTANEOUS | 0 refills | Status: DC
  Filled 2023-10-09: qty 6, 30d supply, fill #0

## 2023-11-27 ENCOUNTER — Telehealth: Payer: Self-pay

## 2023-11-27 ENCOUNTER — Other Ambulatory Visit: Payer: Self-pay

## 2023-11-27 ENCOUNTER — Other Ambulatory Visit: Payer: Self-pay | Admitting: Family Medicine

## 2023-11-27 NOTE — Telephone Encounter (Signed)
 Patient called in with PEC agent upset because he doesn't have refills on insulin  glargine-yfgn (SEMGLEE , YFGN,) 100 UNIT  advised patient of the note attached to the medication that he needs to be seen first before he gets any refills. Appointment set up for 5/13.

## 2023-11-30 NOTE — Telephone Encounter (Signed)
 Noted.

## 2023-12-03 ENCOUNTER — Other Ambulatory Visit: Payer: Self-pay

## 2023-12-03 ENCOUNTER — Encounter: Payer: Self-pay | Admitting: Pharmacist

## 2023-12-08 ENCOUNTER — Ambulatory Visit: Admitting: Internal Medicine

## 2023-12-09 ENCOUNTER — Inpatient Hospital Stay (HOSPITAL_COMMUNITY)
Admission: EM | Admit: 2023-12-09 | Discharge: 2023-12-12 | DRG: 637 | Disposition: A | Discharge status: Home / Self Care | Payer: Self-pay | Attending: Internal Medicine | Admitting: Internal Medicine

## 2023-12-09 ENCOUNTER — Encounter (HOSPITAL_COMMUNITY): Payer: Self-pay | Admitting: Emergency Medicine

## 2023-12-09 DIAGNOSIS — E1110 Type 2 diabetes mellitus with ketoacidosis without coma: Principal | ICD-10-CM | POA: Diagnosis present

## 2023-12-09 DIAGNOSIS — E1165 Type 2 diabetes mellitus with hyperglycemia: Secondary | ICD-10-CM | POA: Diagnosis present

## 2023-12-09 DIAGNOSIS — Z91148 Patient's other noncompliance with medication regimen for other reason: Secondary | ICD-10-CM | POA: Diagnosis not present

## 2023-12-09 DIAGNOSIS — T383X6A Underdosing of insulin and oral hypoglycemic [antidiabetic] drugs, initial encounter: Secondary | ICD-10-CM | POA: Diagnosis present

## 2023-12-09 DIAGNOSIS — I10 Essential (primary) hypertension: Secondary | ICD-10-CM | POA: Diagnosis present

## 2023-12-09 DIAGNOSIS — E860 Dehydration: Secondary | ICD-10-CM | POA: Diagnosis present

## 2023-12-09 DIAGNOSIS — Z79899 Other long term (current) drug therapy: Secondary | ICD-10-CM

## 2023-12-09 DIAGNOSIS — E875 Hyperkalemia: Secondary | ICD-10-CM | POA: Diagnosis present

## 2023-12-09 DIAGNOSIS — E870 Hyperosmolality and hypernatremia: Secondary | ICD-10-CM | POA: Diagnosis not present

## 2023-12-09 DIAGNOSIS — E11649 Type 2 diabetes mellitus with hypoglycemia without coma: Principal | ICD-10-CM

## 2023-12-09 DIAGNOSIS — G9341 Metabolic encephalopathy: Secondary | ICD-10-CM | POA: Diagnosis present

## 2023-12-09 DIAGNOSIS — N179 Acute kidney failure, unspecified: Secondary | ICD-10-CM | POA: Diagnosis present

## 2023-12-09 DIAGNOSIS — E785 Hyperlipidemia, unspecified: Secondary | ICD-10-CM | POA: Diagnosis present

## 2023-12-09 DIAGNOSIS — E1010 Type 1 diabetes mellitus with ketoacidosis without coma: Secondary | ICD-10-CM

## 2023-12-09 DIAGNOSIS — Z794 Long term (current) use of insulin: Secondary | ICD-10-CM

## 2023-12-09 DIAGNOSIS — Z833 Family history of diabetes mellitus: Secondary | ICD-10-CM

## 2023-12-09 DIAGNOSIS — Z87891 Personal history of nicotine dependence: Secondary | ICD-10-CM

## 2023-12-09 LAB — CBC
HCT: 47.8 % (ref 39.0–52.0)
Hemoglobin: 15 g/dL (ref 13.0–17.0)
MCH: 33.6 pg (ref 26.0–34.0)
MCHC: 31.4 g/dL (ref 30.0–36.0)
MCV: 106.9 fL — ABNORMAL HIGH (ref 80.0–100.0)
Platelets: 377 10*3/uL (ref 150–400)
RBC: 4.47 MIL/uL (ref 4.22–5.81)
RDW: 11.2 % — ABNORMAL LOW (ref 11.5–15.5)
WBC: 9.7 10*3/uL (ref 4.0–10.5)
nRBC: 0 % (ref 0.0–0.2)

## 2023-12-09 LAB — CBG MONITORING, ED
Glucose-Capillary: 424 mg/dL — ABNORMAL HIGH (ref 70–99)
Glucose-Capillary: 434 mg/dL — ABNORMAL HIGH (ref 70–99)
Glucose-Capillary: 456 mg/dL — ABNORMAL HIGH (ref 70–99)
Glucose-Capillary: 494 mg/dL — ABNORMAL HIGH (ref 70–99)
Glucose-Capillary: 507 mg/dL (ref 70–99)
Glucose-Capillary: 528 mg/dL (ref 70–99)
Glucose-Capillary: 566 mg/dL (ref 70–99)
Glucose-Capillary: 570 mg/dL (ref 70–99)

## 2023-12-09 LAB — I-STAT CHEM 8, ED
BUN: 68 mg/dL — ABNORMAL HIGH (ref 6–20)
Calcium, Ion: 1.09 mmol/L — ABNORMAL LOW (ref 1.15–1.40)
Chloride: 113 mmol/L — ABNORMAL HIGH (ref 98–111)
Creatinine, Ser: 2.8 mg/dL — ABNORMAL HIGH (ref 0.61–1.24)
Glucose, Bld: 596 mg/dL (ref 70–99)
HCT: 45 % (ref 39.0–52.0)
Hemoglobin: 15.3 g/dL (ref 13.0–17.0)
Potassium: 5.6 mmol/L — ABNORMAL HIGH (ref 3.5–5.1)
Sodium: 148 mmol/L — ABNORMAL HIGH (ref 135–145)
TCO2: 14 mmol/L — ABNORMAL LOW (ref 22–32)

## 2023-12-09 LAB — BASIC METABOLIC PANEL WITH GFR
Anion gap: 30 — ABNORMAL HIGH (ref 5–15)
BUN: 69 mg/dL — ABNORMAL HIGH (ref 6–20)
CO2: 14 mmol/L — ABNORMAL LOW (ref 22–32)
Calcium: 9.5 mg/dL (ref 8.9–10.3)
Chloride: 105 mmol/L (ref 98–111)
Creatinine, Ser: 3.75 mg/dL — ABNORMAL HIGH (ref 0.61–1.24)
GFR, Estimated: 18 mL/min — ABNORMAL LOW (ref >60)
Glucose, Bld: 612 mg/dL (ref 70–99)
Potassium: 5.9 mmol/L — ABNORMAL HIGH (ref 3.5–5.1)
Sodium: 149 mmol/L — ABNORMAL HIGH (ref 135–145)

## 2023-12-09 LAB — I-STAT VENOUS BLOOD GAS, ED
Acid-base deficit: 12 mmol/L — ABNORMAL HIGH (ref 0.0–2.0)
Bicarbonate: 15.2 mmol/L — ABNORMAL LOW (ref 20.0–28.0)
Calcium, Ion: 1.15 mmol/L (ref 1.15–1.40)
HCT: 46 % (ref 39.0–52.0)
Hemoglobin: 15.6 g/dL (ref 13.0–17.0)
O2 Saturation: 82 %
Potassium: 5.7 mmol/L — ABNORMAL HIGH (ref 3.5–5.1)
Sodium: 147 mmol/L — ABNORMAL HIGH (ref 135–145)
TCO2: 16 mmol/L — ABNORMAL LOW (ref 22–32)
pCO2, Ven: 39.2 mmHg — ABNORMAL LOW (ref 44–60)
pH, Ven: 7.196 — CL (ref 7.25–7.43)
pO2, Ven: 57 mmHg — ABNORMAL HIGH (ref 32–45)

## 2023-12-09 LAB — BETA-HYDROXYBUTYRIC ACID: Beta-Hydroxybutyric Acid: >8 mmol/L — ABNORMAL HIGH (ref 0.05–0.27)

## 2023-12-09 MED ORDER — DEXTROSE IN LACTATED RINGERS 5 % IV SOLN: INTRAVENOUS | Status: AC

## 2023-12-09 MED ORDER — ACETAMINOPHEN 325 MG PO TABS: 650.0000 mg | ORAL_TABLET | Freq: Four times a day (QID) | ORAL | Status: DC | PRN

## 2023-12-09 MED ORDER — MELATONIN 5 MG PO TABS: 5.0000 mg | ORAL_TABLET | Freq: Every evening | ORAL | Status: DC | PRN

## 2023-12-09 MED ORDER — DEXTROSE 50 % IV SOLN: 0.0000 mL | INTRAVENOUS | Status: DC | PRN

## 2023-12-09 MED ORDER — ENOXAPARIN SODIUM 30 MG/0.3ML IJ SOSY
30.0000 mg | PREFILLED_SYRINGE | INTRAMUSCULAR | Status: DC
  Administered 2023-12-10: 30 mg via SUBCUTANEOUS
  Filled 2023-12-09: qty 0.3

## 2023-12-09 MED ORDER — INSULIN REGULAR(HUMAN) IN NACL 100-0.9 UT/100ML-% IV SOLN
INTRAVENOUS | Status: DC
  Administered 2023-12-09: 8 [IU]/h via INTRAVENOUS
  Filled 2023-12-09 (×2): qty 100

## 2023-12-09 MED ORDER — ATORVASTATIN CALCIUM 40 MG PO TABS
40.0000 mg | ORAL_TABLET | Freq: Every day | ORAL | Status: DC
  Administered 2023-12-10 – 2023-12-12 (×3): 40 mg via ORAL
  Filled 2023-12-09 (×3): qty 1

## 2023-12-09 MED ORDER — ALBUTEROL SULFATE (2.5 MG/3ML) 0.083% IN NEBU
10.0000 mg | INHALATION_SOLUTION | Freq: Once | RESPIRATORY_TRACT | Status: AC
  Administered 2023-12-09: 10 mg via RESPIRATORY_TRACT
  Filled 2023-12-09: qty 12

## 2023-12-09 MED ORDER — LACTATED RINGERS IV SOLN: INTRAVENOUS | Status: AC

## 2023-12-09 MED ORDER — SODIUM CHLORIDE 0.9 % IV BOLUS
1000.0000 mL | Freq: Once | INTRAVENOUS | Status: AC
  Administered 2023-12-09: 1000 mL via INTRAVENOUS

## 2023-12-09 MED ORDER — PROCHLORPERAZINE EDISYLATE 10 MG/2ML IJ SOLN: 5.0000 mg | Freq: Four times a day (QID) | INTRAMUSCULAR | Status: DC | PRN

## 2023-12-09 MED ORDER — POLYETHYLENE GLYCOL 3350 17 G PO PACK
17.0000 g | PACK | Freq: Every day | ORAL | Status: DC | PRN
  Administered 2023-12-11: 17 g via ORAL
  Filled 2023-12-09: qty 1

## 2023-12-09 MED ORDER — SODIUM CHLORIDE 0.9 % IV BOLUS
2000.0000 mL | Freq: Once | INTRAVENOUS | Status: AC
  Administered 2023-12-09: 2000 mL via INTRAVENOUS

## 2023-12-09 NOTE — H&P (Signed)
 History and Physical  Stanley Edwards NWG:[id number] DOB: 1966/01/23 DOA: 12/09/2023  Referring physician: Dr. Efraim Grange, EDP  PCP: Joaquin Mulberry, MD  Outpatient Specialists: None. Patient coming from: Home.  Chief Complaint: Hyperglycemia  HPI: Stanley Edwards is a 58 y.o. male with medical history significant for type 2 diabetes, hyperlipidemia, medication noncompliance, who presents to the ER with complaints of hyperglycemia.  Patient states he ran out of his insulin  this morning.  No reported subjective fevers or chills.  Denies any cardiopulmonary, urinary, or GI symptoms.  EMS was activated.  In the ER, hyperglycemic with serum glucose greater than 600, serum bicarb 14, anion gap of 30, beta hydroxybutyrate acid greater than 8, pH 7.196 on VBG.  The patient was promptly started on insulin  drip and IV fluid.  Hyperkalemia of 5.9 was treated with albuterol  nebs along with IV insulin .  DKA protocol was initiated in the ER.  TRH, hospitalist service, was asked to admit.  ED Course: Temperature 90.3.  BP 118/75, pulse 117, respiratory 20, O2 saturation 100% on room air.  Lab studies notable for serum sodium 149, potassium 5.9, serum bicarb 14, glucose 612, BUN 69, creatinine 3.75 with GFR of 18.  Baseline creatinine 1.1 with GFR of 74.  Review of Systems: Review of systems as noted in the HPI. All other systems reviewed and are negative.   Past Medical History:  Diagnosis Date   Allergy    Diabetes mellitus without complication (HCC)    Hypertension    Pt denies htn but it was on his previous hx   History reviewed. No pertinent surgical history.  Social History:  reports that he has quit smoking. His smoking use included cigarettes. He has never used smokeless tobacco. He reports current alcohol  use of about 1.0 standard drink of alcohol  per week. He reports that he does not use drugs.   No Known Allergies  Family History  Problem Relation Age of Onset   Cancer Mother     Diabetes Father       Prior to Admission medications   Medication Sig Start Date End Date Taking? Authorizing Provider  atorvastatin  (LIPITOR) 40 MG tablet Take 1 tablet (40 mg total) by mouth daily. 01/26/23   Lawrance Presume, MD  Blood Glucose Monitoring Suppl [facility] VERIO) w/Device KIT Use to check blood sugar 3 times daily 10/16/22   Newlin, [name], MD  Continuous Glucose Sensor (DEXCOM G7 SENSOR) MISC Change sensor every 10 days 01/26/23   Lawrance Presume, MD  glucose blood (ONETOUCH VERIO) test strip Use as instructed 10/16/22   Newlin, [name], MD  insulin  glargine-yfgn (SEMGLEE , YFGN,) 100 UNIT/ML Pen Inject 20 Units into the skin daily. Please make an appointment for more refills. 10/09/23   Newlin, [name], MD  insulin  lispro (HUMALOG ) 100 UNIT/ML KwikPen Inject 20 Units into the skin 3 (three) times daily before meals. 10/14/22   Newlin, [name], MD  Insulin  Pen Needle 32G X 4 MM MISC Use to inject insulin  4 times daily. 07/10/22   Hassie Lint, PA-C  Lancets (ONETOUCH DELICA PLUS [place]) MISC Use to check blood sugar 3 times a day 10/16/22   Joaquin Mulberry, MD    Physical Exam: BP 118/75 (BP Location: Right Arm)   Pulse (!) 112   Temp 98.3 F (36.8 C) (Oral)   Resp 18   SpO2 100%   General: 58 y.o. year-old male well developed well nourished in no acute distress.  Alert and oriented x3. Cardiovascular: Tachycardic with no rubs  or gallops.  No thyromegaly or JVD noted.  No lower extremity edema. 2/4 pulses in all 4 extremities. Respiratory: Clear to auscultation with no wheezes or rales. Good inspiratory effort. Abdomen: Soft nontender nondistended with normal bowel sounds x4 quadrants. Muskuloskeletal: No cyanosis, clubbing or edema noted bilaterally Neuro: CN II-XII intact, strength, sensation, reflexes Skin: No ulcerative lesions noted or rashes Psychiatry: Judgement and insight appear normal. Mood is appropriate for condition and setting          Labs on  Admission:  Basic Metabolic Panel: Recent Labs  Lab 12/09/23 1655 12/09/23 1802 12/09/23 1815  NA 148* 149* 147*  K 5.6* 5.9* 5.7*  CL 113* 105  --   CO2  --  14*  --   GLUCOSE 596* 612*  --   BUN 68* 69*  --   CREATININE 2.80* 3.75*  --   CALCIUM   --  9.5  --    Liver Function Tests: No results for input(s): "AST", "ALT", "ALKPHOS", "BILITOT", "PROT", "ALBUMIN" in the last 168 hours. No results for input(s): "LIPASE", "AMYLASE" in the last 168 hours. No results for input(s): "AMMONIA" in the last 168 hours. CBC: Recent Labs  Lab 12/09/23 1652 12/09/23 1655 12/09/23 1815  WBC 9.7  --   --   HGB 15.0 15.3 15.6  HCT 47.8 45.0 46.0  MCV 106.9*  --   --   PLT 377  --   --    Cardiac Enzymes: No results for input(s): "CKTOTAL", "CKMB", "CKMBINDEX", "TROPONINI" in the last 168 hours.  BNP (last 3 results) No results for input(s): "BNP" in the last 8760 hours.  ProBNP (last 3 results) No results for input(s): "PROBNP" in the last 8760 hours.  CBG: Recent Labs  Lab 12/09/23 1632 12/09/23 2001 12/09/23 2039  GLUCAP 528* 570* 507*    Radiological Exams on Admission: No results found.  EKG: I independently viewed the EKG done and my findings are as followed: Sinus tachycardia rate of 112.  Nonspecific ST-T changes.  QTc 449.  Assessment/Plan Present on Admission:  DKA, type 2 (HCC)  Principal Problem:   DKA, type 2 (HCC)  DKA type II, suspect secondary to noncompliance Presented with serum glucose greater than 600, high anion gap metabolic acidosis with pH 7.196 on VBG, elevated beta hydroxybutyric acid greater than 8. DKA protocol in place, continue Continue IV fluid hydration and IV insulin , per DKA protocol Transition to subcu insulin  when appropriate Diabetes coordinator consulted for patient's education Follow BMP every 4 hours and beta hydroxybutyrate acid twice daily.  AKI, prerenal in the setting of dehydration from hyperglycemia Presented with a  creatinine of 3.75 with GFR of 18. Continue IV fluid hydration If no improvement of AKI obtain renal ultrasound to rule out any structural abnormalities. Monitor urine output Repeat BMP in the morning. Avoid nephrotoxic agent, dehydration, and hypotension.  High anion gap metabolic acidosis in the setting of DKA Continue IV insulin  and IV fluid Repeat BMP as stated above.  Hyperkalemia secondary to renal insufficiency Continue IV insulin  Additionally received albuterol  nebs in the ER Repeat BMP  Hyperlipidemia Resume home Lipitor  Medication noncompliance Counseled on medication compliance, he was minimally interactive.   Critical care time: 55 minutes.   DVT prophylaxis: Subcu Lovenox  daily  Code Status: Full code.  Family Communication: None at bedside.  Disposition Plan: Admitted to progressive care unit.  Consults called: Diabetes coordinator.  Admission status: Inpatient status.   Status is: Inpatient The patient requires at least 2  midnights for further evaluation and treatment of present condition.   Bary Boss MD [facility] Pager [phone]  If 7PM-7AM, please contact night-coverage [email or web address] Password [email or web address]  12/09/2023, 9:00 PM

## 2023-12-09 NOTE — ED Provider Notes (Signed)
 [facility] DEPARTMENT AT [facility] Provider Note   CSN: [id number] Arrival date & time: 12/09/23  1625     History  Chief Complaint  Patient presents with   Hyperglycemia    Stanley Edwards is a 58 y.o. male.  58 year old male with history of insulin -dependent diabetes who presents emergency department with concerns for DKA.  Patient reports that he stopped taking his insulin  this morning at 7 AM because he ran out.  Says that he feels like he is in DKA with increased thirst and urination.  No shortness of breath.       Home Medications Prior to Admission medications   Medication Sig Start Date End Date Taking? Authorizing Provider  glucose blood (ONETOUCH VERIO) test strip Use as directed to check blood sugar 12/12/23  Yes Elgergawy, Ardia Kraft, MD  atorvastatin  (LIPITOR) 40 MG tablet Take 1 tablet (40 mg total) by mouth daily. 12/12/23   Elgergawy, Ardia Kraft, MD  Blood Glucose Monitoring Suppl (ONETOUCH VERIO FLEX SYSTEM) w/Device KIT Use as directed 3 times daily. 12/12/23   Elgergawy, Ardia Kraft, MD  Continuous Glucose Sensor (DEXCOM G7 SENSOR) MISC Use to check blood sugar as directed. Change sensor every 10 days 12/12/23   Elgergawy, Ardia Kraft, MD  insulin  glargine-yfgn (SEMGLEE , YFGN,) 100 UNIT/ML Pen Inject 20 Units into the skin daily. Please make an appointment for more refills. 12/12/23   Elgergawy, Ardia Kraft, MD  insulin  lispro (HUMALOG ) 100 UNIT/ML KwikPen Inject 5 Units into the skin 3 (three) times daily before meals. 12/12/23   Elgergawy, Ardia Kraft, MD  Insulin  Pen Needle 32G X 4 MM MISC Use to inject insulin  4 times daily. 12/12/23   Elgergawy, Ardia Kraft, MD  Lancets [facility] DELICA PLUS [place]) MISC Use to check blood sugar 3 times a day 12/12/23   Elgergawy, Ardia Kraft, MD      Allergies    Patient has no known allergies.    Review of Systems   Review of Systems  Physical Exam Updated Vital Signs BP 117/60 (BP Location: Left Arm)   Pulse 91    Temp 98.8 F (37.1 C) (Oral)   Resp 16   Ht 6\' 2"  (1.88 m)   Wt 93 kg   SpO2 93%   BMI 26.32 kg/m  Physical Exam Vitals and nursing note reviewed.  Constitutional:      General: He is not in acute distress.    Appearance: He is well-developed.  HENT:     Head: Normocephalic and atraumatic.     Right Ear: External ear normal.     Left Ear: External ear normal.     Nose: Nose normal.  Eyes:     Extraocular Movements: Extraocular movements intact.     Conjunctiva/sclera: Conjunctivae normal.     Pupils: Pupils are equal, round, and reactive to light.  Cardiovascular:     Rate and Rhythm: Regular rhythm. Tachycardia present.     Heart sounds: Normal heart sounds.  Pulmonary:     Effort: Pulmonary effort is normal. No respiratory distress.     Breath sounds: Normal breath sounds.  Musculoskeletal:     Cervical back: Normal range of motion and neck supple.     Right lower leg: No edema.     Left lower leg: No edema.  Skin:    General: Skin is warm and dry.  Neurological:     Mental Status: He is alert. Mental status is at baseline.  Psychiatric:  Mood and Affect: Mood normal.        Behavior: Behavior normal.     ED Results / Procedures / Treatments   Labs (all labs ordered are listed, but only abnormal results are displayed) Labs Reviewed  CBC - Abnormal; Notable for the following components:      Result Value   MCV 106.9 (*)    RDW 11.2 (*)    All other components within normal limits  URINALYSIS, ROUTINE W REFLEX MICROSCOPIC - Abnormal; Notable for the following components:   APPearance HAZY (*)    Glucose, UA >=500 (*)    Ketones, ur 20 (*)    All other components within normal limits  BASIC METABOLIC PANEL WITH GFR - Abnormal; Notable for the following components:   Sodium 149 (*)    Potassium 5.9 (*)    CO2 14 (*)    Glucose, Bld 612 (*)    BUN 69 (*)    Creatinine, Ser 3.75 (*)    GFR, Estimated 18 (*)    Anion gap 30 (*)    All other components  within normal limits  BETA-HYDROXYBUTYRIC ACID - Abnormal; Notable for the following components:   Beta-Hydroxybutyric Acid >8.00 (*)    All other components within normal limits  BASIC METABOLIC PANEL WITH GFR - Abnormal; Notable for the following components:   Sodium 152 (*)    Chloride 118 (*)    CO2 16 (*)    Glucose, Bld 237 (*)    BUN 48 (*)    Creatinine, Ser 1.79 (*)    Calcium  8.8 (*)    GFR, Estimated 43 (*)    Anion gap 18 (*)    All other components within normal limits  BASIC METABOLIC PANEL WITH GFR - Abnormal; Notable for the following components:   Sodium 153 (*)    CO2 14 (*)    Glucose, Bld 466 (*)    BUN 70 (*)    Creatinine, Ser 3.33 (*)    GFR, Estimated 21 (*)    Anion gap 28 (*)    All other components within normal limits  BASIC METABOLIC PANEL WITH GFR - Abnormal; Notable for the following components:   Sodium 156 (*)    Chloride 118 (*)    Glucose, Bld 169 (*)    BUN 51 (*)    Creatinine, Ser 1.92 (*)    GFR, Estimated 40 (*)    All other components within normal limits  BASIC METABOLIC PANEL WITH GFR - Abnormal; Notable for the following components:   Sodium 154 (*)    Chloride 115 (*)    Glucose, Bld 240 (*)    BUN 61 (*)    Creatinine, Ser 2.48 (*)    GFR, Estimated 29 (*)    Anion gap 16 (*)    All other components within normal limits  BETA-HYDROXYBUTYRIC ACID - Abnormal; Notable for the following components:   Beta-Hydroxybutyric Acid 2.67 (*)    All other components within normal limits  CBC - Abnormal; Notable for the following components:   MCV 102.6 (*)    RDW 11.2 (*)    All other components within normal limits  MAGNESIUM  - Abnormal; Notable for the following components:   Magnesium  3.2 (*)    All other components within normal limits  BASIC METABOLIC PANEL WITH GFR - Abnormal; Notable for the following components:   Sodium 156 (*)    Chloride 118 (*)    Glucose, Bld 199 (*)  BUN 53 (*)    Creatinine, Ser 2.02 (*)     GFR, Estimated 38 (*)    All other components within normal limits  HEMOGLOBIN A1C - Abnormal; Notable for the following components:   Hgb A1c MFr Bld 12.2 (*)    All other components within normal limits  GLUCOSE, CAPILLARY - Abnormal; Notable for the following components:   Glucose-Capillary 162 (*)    All other components within normal limits  CBC - Abnormal; Notable for the following components:   WBC 11.9 (*)    RBC 3.71 (*)    Hemoglobin 12.5 (*)    HCT 38.2 (*)    MCV 103.0 (*)    RDW 11.3 (*)    All other components within normal limits  BASIC METABOLIC PANEL WITH GFR - Abnormal; Notable for the following components:   Sodium 147 (*)    Glucose, Bld 119 (*)    BUN 31 (*)    Creatinine, Ser 1.46 (*)    GFR, Estimated 55 (*)    All other components within normal limits  PHOSPHORUS - Abnormal; Notable for the following components:   Phosphorus 2.0 (*)    All other components within normal limits  MAGNESIUM  - Abnormal; Notable for the following components:   Magnesium  2.7 (*)    All other components within normal limits  GLUCOSE, CAPILLARY - Abnormal; Notable for the following components:   Glucose-Capillary 209 (*)    All other components within normal limits  GLUCOSE, CAPILLARY - Abnormal; Notable for the following components:   Glucose-Capillary 240 (*)    All other components within normal limits  BETA-HYDROXYBUTYRIC ACID - Abnormal; Notable for the following components:   Beta-Hydroxybutyric Acid 1.32 (*)    All other components within normal limits  BETA-HYDROXYBUTYRIC ACID - Abnormal; Notable for the following components:   Beta-Hydroxybutyric Acid 1.99 (*)    All other components within normal limits  GLUCOSE, CAPILLARY - Abnormal; Notable for the following components:   Glucose-Capillary 243 (*)    All other components within normal limits  GLUCOSE, CAPILLARY - Abnormal; Notable for the following components:   Glucose-Capillary 214 (*)    All other  components within normal limits  GLUCOSE, CAPILLARY - Abnormal; Notable for the following components:   Glucose-Capillary 194 (*)    All other components within normal limits  FOLATE - Abnormal; Notable for the following components:   Folate 4.5 (*)    All other components within normal limits  GLUCOSE, CAPILLARY - Abnormal; Notable for the following components:   Glucose-Capillary 236 (*)    All other components within normal limits  GLUCOSE, CAPILLARY - Abnormal; Notable for the following components:   Glucose-Capillary 238 (*)    All other components within normal limits  GLUCOSE, CAPILLARY - Abnormal; Notable for the following components:   Glucose-Capillary 242 (*)    All other components within normal limits  BASIC METABOLIC PANEL WITH GFR - Abnormal; Notable for the following components:   Glucose, Bld 249 (*)    BUN 27 (*)    Calcium  8.2 (*)    All other components within normal limits  CBC - Abnormal; Notable for the following components:   RBC 2.94 (*)    Hemoglobin 10.0 (*)    HCT 29.3 (*)    RDW 10.9 (*)    All other components within normal limits  GLUCOSE, CAPILLARY - Abnormal; Notable for the following components:   Glucose-Capillary 307 (*)    All other components within  normal limits  GLUCOSE, CAPILLARY - Abnormal; Notable for the following components:   Glucose-Capillary 274 (*)    All other components within normal limits  CBG MONITORING, ED - Abnormal; Notable for the following components:   Glucose-Capillary 528 (*)    All other components within normal limits  CBG MONITORING, ED - Abnormal; Notable for the following components:   Glucose-Capillary 570 (*)    All other components within normal limits  I-STAT CHEM 8, ED - Abnormal; Notable for the following components:   Sodium 148 (*)    Potassium 5.6 (*)    Chloride 113 (*)    BUN 68 (*)    Creatinine, Ser 2.80 (*)    Glucose, Bld 596 (*)    Calcium , Ion 1.09 (*)    TCO2 14 (*)    All other  components within normal limits  I-STAT VENOUS BLOOD GAS, ED - Abnormal; Notable for the following components:   pH, Ven 7.196 (*)    pCO2, Ven 39.2 (*)    pO2, Ven 57 (*)    Bicarbonate 15.2 (*)    TCO2 16 (*)    Acid-base deficit 12.0 (*)    Sodium 147 (*)    Potassium 5.7 (*)    All other components within normal limits  CBG MONITORING, ED - Abnormal; Notable for the following components:   Glucose-Capillary 507 (*)    All other components within normal limits  CBG MONITORING, ED - Abnormal; Notable for the following components:   Glucose-Capillary 566 (*)    All other components within normal limits  CBG MONITORING, ED - Abnormal; Notable for the following components:   Glucose-Capillary 494 (*)    All other components within normal limits  CBG MONITORING, ED - Abnormal; Notable for the following components:   Glucose-Capillary 456 (*)    All other components within normal limits  CBG MONITORING, ED - Abnormal; Notable for the following components:   Glucose-Capillary 434 (*)    All other components within normal limits  CBG MONITORING, ED - Abnormal; Notable for the following components:   Glucose-Capillary 424 (*)    All other components within normal limits  CBG MONITORING, ED - Abnormal; Notable for the following components:   Glucose-Capillary 371 (*)    All other components within normal limits  CBG MONITORING, ED - Abnormal; Notable for the following components:   Glucose-Capillary 316 (*)    All other components within normal limits  CBG MONITORING, ED - Abnormal; Notable for the following components:   Glucose-Capillary 286 (*)    All other components within normal limits  CBG MONITORING, ED - Abnormal; Notable for the following components:   Glucose-Capillary 255 (*)    All other components within normal limits  CBG MONITORING, ED - Abnormal; Notable for the following components:   Glucose-Capillary 214 (*)    All other components within normal limits  CBG  MONITORING, ED - Abnormal; Notable for the following components:   Glucose-Capillary 189 (*)    All other components within normal limits  CBG MONITORING, ED - Abnormal; Notable for the following components:   Glucose-Capillary 176 (*)    All other components within normal limits  CBG MONITORING, ED - Abnormal; Notable for the following components:   Glucose-Capillary 180 (*)    All other components within normal limits  CBG MONITORING, ED - Abnormal; Notable for the following components:   Glucose-Capillary 177 (*)    All other components within normal limits  CBG MONITORING,  ED - Abnormal; Notable for the following components:   Glucose-Capillary 158 (*)    All other components within normal limits  CBG MONITORING, ED - Abnormal; Notable for the following components:   Glucose-Capillary 185 (*)    All other components within normal limits  CBG MONITORING, ED - Abnormal; Notable for the following components:   Glucose-Capillary 162 (*)    All other components within normal limits  CBG MONITORING, ED - Abnormal; Notable for the following components:   Glucose-Capillary 168 (*)    All other components within normal limits  CBG MONITORING, ED - Abnormal; Notable for the following components:   Glucose-Capillary 135 (*)    All other components within normal limits  CBG MONITORING, ED - Abnormal; Notable for the following components:   Glucose-Capillary 141 (*)    All other components within normal limits  HIV ANTIBODY (ROUTINE TESTING W REFLEX)  PHOSPHORUS  VITAMIN B12  PHOSPHORUS    EKG EKG Interpretation Date/Time:  Wednesday Dec 09 2023 19:57:36 EDT Ventricular Rate:  112 PR Interval:  140 QRS Duration:  83 QT Interval:  329 QTC Calculation: 449 R Axis:   94  Text Interpretation: Sinus tachycardia Borderline right axis deviation Confirmed by Shyrl Doyne [phone]) on 12/09/2023 8:01:09 PM  Radiology No results found.  Procedures Procedures    Medications  Ordered in ED Medications  lactated ringers  infusion (0 mLs Intravenous Stopped 12/10/23 0436)  dextrose  5 % in lactated ringers  infusion (0 mLs Intravenous Stopping previously hung infusion 12/10/23 2006)  phosphorus (K PHOS  NEUTRAL) tablet 500 mg (500 mg Oral Given 12/11/23 2249)  sodium chloride  0.9 % bolus 2,000 mL (0 mLs Intravenous Stopped 12/09/23 2012)  sodium chloride  0.9 % bolus 1,000 mL (0 mLs Intravenous Stopped 12/09/23 2313)  albuterol  (PROVENTIL ) (2.5 MG/3ML) 0.083% nebulizer solution 10 mg (10 mg Nebulization Given 12/09/23 2012)  potassium chloride  SA (KLOR-CON  M) CR tablet 40 mEq (40 mEq Oral Given 12/11/23 0923)  living well with diabetes book MISC ( Does not apply Given 12/11/23 1159)    ED Course/ Medical Decision Making/ A&P Clinical Course as of 12/14/23 0710  Wed Dec 09, 2023  2054 Dr Del Favia from hospitalist to admit the patient. [RP]    Clinical Course User Index [RP] Ninetta Basket, MD                                 Medical Decision Making Amount and/or Complexity of Data Reviewed Labs: ordered.  Risk Prescription drug management. Decision regarding hospitalization.   Stanley Edwards is a 58 y.o. male with comorbidities that complicate the patient evaluation including insulin -dependent diabetes who presents to the emergency department with increased thirst and urination in the setting of not taking his insulin   Initial Ddx:  DKA, HHS, hyperglycemia, infection, medication noncompliance, hyperkalemia/electrolyte abnormality  MDM:  With the patient's elevated blood sugar and noncompliance with insulin  concerned about possible DKA so we will obtain lab work at this time.  Appears to be mentating well currently so low concern for HHS.  If labs unremarkable could potentially have asymptomatic hyperglycemia.  No clear infectious causes that would have precipitated the patient's symptoms.  They report being compliant with her medication as well.  Will obtain lab work  and EKG in case the patient is hyperkalemic or has any other electrolyte abnormalities.  Plan:  Labs VBG Osms Urinalysis EKG  ED Summary/Re-evaluation:  Patient found to be in  DKA with AKI.  Potassium 4.5.  Patient given IV fluids and started on insulin  drip as well.  Admitted to hospitalist for further management.  This patient presents to the ED for concern of complaints listed in HPI, this involves an extensive number of treatment options, and is a complaint that carries with it a high risk of complications and morbidity. Disposition including potential need for admission considered.   Dispo: Admit  Records reviewed ED Visit Notes The following labs were independently interpreted: Chemistry and VBG and show DKA I personally reviewed and interpreted cardiac monitoring: normal sinus rhythm  I personally reviewed and interpreted the pt's EKG: see above for interpretation  I have reviewed the patients home medications and made adjustments as needed Consults: Hospitalist  CRITICAL CARE Performed by: Ninetta Basket   Total critical care time: 30 minutes  Critical care time was exclusive of separately billable procedures and treating other patients.  Critical care was necessary to treat or prevent imminent or life-threatening deterioration.  Critical care was time spent personally by me on the following activities: development of treatment plan with patient and/or surrogate as well as nursing, discussions with consultants, evaluation of patient's response to treatment, examination of patient, obtaining history from patient or surrogate, ordering and performing treatments and interventions, ordering and review of laboratory studies, ordering and review of radiographic studies, pulse oximetry and re-evaluation of patient's condition.   Final Clinical Impression(s) / ED Diagnoses Final diagnoses:  Diabetic ketoacidosis without coma associated with type 1 diabetes mellitus (HCC)   AKI (acute kidney injury) (HCC)    Rx / DC Orders ED Discharge Orders          Ordered    atorvastatin  (LIPITOR) 40 MG tablet  Daily        12/12/23 0959    insulin  glargine-yfgn (SEMGLEE , YFGN,) 100 UNIT/ML Pen  Daily       Note to Pharmacy: Must have office visit for refills. Has not been seen since July.   12/12/23 0959    insulin  lispro (HUMALOG ) 100 UNIT/ML KwikPen  3 times daily before meals       Note to Pharmacy: alicia meant to send kwikpen -shayla 08/28/22   12/12/23 0959    Blood Glucose Monitoring Suppl (ONETOUCH VERIO FLEX SYSTEM) w/Device KIT        12/12/23 0959    Continuous Glucose Sensor (DEXCOM G7 SENSOR) MISC        12/12/23 0959    glucose blood (ONETOUCH VERIO) test strip        12/12/23 0959    Insulin  Pen Needle 32G X 4 MM MISC        12/12/23 0959    Lancets (ONETOUCH DELICA PLUS LANCET33G) MISC        12/12/23 0959    Increase activity slowly        12/12/23 0959    Diet - low sodium heart healthy        12/12/23 0959    Discharge instructions       Comments: Follow with Primary MD Joaquin Mulberry, MD in 7 days   Get CBC, CMP,  checked  by Primary MD next visit.    Activity: As tolerated with Full fall precautions use walker/cane & assistance as needed   Disposition Home    Diet: Heart Healthy/Carb modified   On your next visit with your primary care physician please Get Medicines reviewed and adjusted.   Please request your Prim.MD to go over all [facility]  Tests and Procedure/Radiological results at the follow up, please get all Hospital records sent to your Prim MD by signing hospital release before you go home.   If you experience worsening of your admission symptoms, develop shortness of breath, life threatening emergency, suicidal or homicidal thoughts you must seek medical attention immediately by calling 911 or calling your MD immediately  if symptoms less severe.  You Must read complete instructions/literature along with all the  possible adverse reactions/side effects for all the Medicines you take and that have been prescribed to you. Take any new Medicines after you have completely understood and accpet all the possible adverse reactions/side effects.   Do not drive, operating heavy machinery, perform activities at heights, swimming or participation in water  activities or provide baby sitting services if your were admitted for syncope or siezures until you have seen by Primary MD or a Neurologist and advised to do so again.  Do not drive when taking Pain medications.    Do not take more than prescribed Pain, Sleep and Anxiety Medications  Special Instructions: If you have smoked or chewed Tobacco  in the last 2 yrs please stop smoking, stop any regular Alcohol   and or any Recreational drug use.  Wear Seat belts while driving.   Please note  You were cared for by a hospitalist during your hospital stay. If you have any questions about your discharge medications or the care you received while you were in the hospital after you are discharged, you can call the unit and asked to speak with the hospitalist on call if the hospitalist that took care of you is not available. Once you are discharged, your primary care physician will handle any further medical issues. Please note that NO REFILLS for any discharge medications will be authorized once you are discharged, as it is imperative that you return to your primary care physician (or establish a relationship with a primary care physician if you do not have one) for your aftercare needs so that they can reassess your need for medications and monitor your lab values.   12/12/23 1610              Ninetta Basket, MD 12/14/23 [phone]

## 2023-12-09 NOTE — ED Notes (Signed)
 Consult to IV team 2 Rns unsuccessful and lab unable to draw blood charge nurse updated and IV consult placed see orders

## 2023-12-09 NOTE — ED Triage Notes (Signed)
 Pt BIB by [facility] reporting hyperglycemia. Hx of DM, non-compliant with medications. No interventions en route.

## 2023-12-10 ENCOUNTER — Other Ambulatory Visit: Payer: Self-pay

## 2023-12-10 DIAGNOSIS — E870 Hyperosmolality and hypernatremia: Secondary | ICD-10-CM | POA: Diagnosis not present

## 2023-12-10 DIAGNOSIS — E1165 Type 2 diabetes mellitus with hyperglycemia: Secondary | ICD-10-CM

## 2023-12-10 DIAGNOSIS — N179 Acute kidney failure, unspecified: Secondary | ICD-10-CM | POA: Diagnosis not present

## 2023-12-10 DIAGNOSIS — E1110 Type 2 diabetes mellitus with ketoacidosis without coma: Secondary | ICD-10-CM | POA: Diagnosis not present

## 2023-12-10 DIAGNOSIS — G9341 Metabolic encephalopathy: Secondary | ICD-10-CM | POA: Diagnosis not present

## 2023-12-10 LAB — HIV ANTIBODY (ROUTINE TESTING W REFLEX): HIV Screen 4th Generation wRfx: NONREACTIVE

## 2023-12-10 LAB — CBC
HCT: 47.4 % (ref 39.0–52.0)
Hemoglobin: 15.2 g/dL (ref 13.0–17.0)
MCH: 32.9 pg (ref 26.0–34.0)
MCHC: 32.1 g/dL (ref 30.0–36.0)
MCV: 102.6 fL — ABNORMAL HIGH (ref 80.0–100.0)
Platelets: 319 10*3/uL (ref 150–400)
RBC: 4.62 MIL/uL (ref 4.22–5.81)
RDW: 11.2 % — ABNORMAL LOW (ref 11.5–15.5)
WBC: 10.1 10*3/uL (ref 4.0–10.5)
nRBC: 0 % (ref 0.0–0.2)

## 2023-12-10 LAB — BASIC METABOLIC PANEL WITH GFR
Anion gap: 28 — ABNORMAL HIGH (ref 5–15)
Anion gap: 16 — ABNORMAL HIGH (ref 5–15)
Anion gap: 10 (ref 5–15)
Anion gap: 10 (ref 5–15)
Anion gap: 18 — ABNORMAL HIGH (ref 5–15)
BUN: 70 mg/dL — ABNORMAL HIGH (ref 6–20)
BUN: 61 mg/dL — ABNORMAL HIGH (ref 6–20)
BUN: 53 mg/dL — ABNORMAL HIGH (ref 6–20)
BUN: 51 mg/dL — ABNORMAL HIGH (ref 6–20)
BUN: 48 mg/dL — ABNORMAL HIGH (ref 6–20)
CO2: 14 mmol/L — ABNORMAL LOW (ref 22–32)
CO2: 23 mmol/L (ref 22–32)
CO2: 28 mmol/L (ref 22–32)
CO2: 28 mmol/L (ref 22–32)
CO2: 16 mmol/L — ABNORMAL LOW (ref 22–32)
Calcium: 9.2 mg/dL (ref 8.9–10.3)
Calcium: 9.3 mg/dL (ref 8.9–10.3)
Calcium: 9 mg/dL (ref 8.9–10.3)
Calcium: 9.1 mg/dL (ref 8.9–10.3)
Calcium: 8.8 mg/dL — ABNORMAL LOW (ref 8.9–10.3)
Chloride: 111 mmol/L (ref 98–111)
Chloride: 115 mmol/L — ABNORMAL HIGH (ref 98–111)
Chloride: 118 mmol/L — ABNORMAL HIGH (ref 98–111)
Chloride: 118 mmol/L — ABNORMAL HIGH (ref 98–111)
Chloride: 118 mmol/L — ABNORMAL HIGH (ref 98–111)
Creatinine, Ser: 3.33 mg/dL — ABNORMAL HIGH (ref 0.61–1.24)
Creatinine, Ser: 2.48 mg/dL — ABNORMAL HIGH (ref 0.61–1.24)
Creatinine, Ser: 2.02 mg/dL — ABNORMAL HIGH (ref 0.61–1.24)
Creatinine, Ser: 1.92 mg/dL — ABNORMAL HIGH (ref 0.61–1.24)
Creatinine, Ser: 1.79 mg/dL — ABNORMAL HIGH (ref 0.61–1.24)
GFR, Estimated: 21 mL/min — ABNORMAL LOW (ref >60)
GFR, Estimated: 29 mL/min — ABNORMAL LOW (ref >60)
GFR, Estimated: 38 mL/min — ABNORMAL LOW (ref >60)
GFR, Estimated: 40 mL/min — ABNORMAL LOW (ref >60)
GFR, Estimated: 43 mL/min — ABNORMAL LOW (ref >60)
Glucose, Bld: 466 mg/dL — ABNORMAL HIGH (ref 70–99)
Glucose, Bld: 240 mg/dL — ABNORMAL HIGH (ref 70–99)
Glucose, Bld: 199 mg/dL — ABNORMAL HIGH (ref 70–99)
Glucose, Bld: 169 mg/dL — ABNORMAL HIGH (ref 70–99)
Glucose, Bld: 237 mg/dL — ABNORMAL HIGH (ref 70–99)
Potassium: 4.5 mmol/L (ref 3.5–5.1)
Potassium: 4.2 mmol/L (ref 3.5–5.1)
Potassium: 4 mmol/L (ref 3.5–5.1)
Potassium: 4.1 mmol/L (ref 3.5–5.1)
Potassium: 4.5 mmol/L (ref 3.5–5.1)
Sodium: 153 mmol/L — ABNORMAL HIGH (ref 135–145)
Sodium: 154 mmol/L — ABNORMAL HIGH (ref 135–145)
Sodium: 156 mmol/L — ABNORMAL HIGH (ref 135–145)
Sodium: 156 mmol/L — ABNORMAL HIGH (ref 135–145)
Sodium: 152 mmol/L — ABNORMAL HIGH (ref 135–145)

## 2023-12-10 LAB — CBG MONITORING, ED
Glucose-Capillary: 135 mg/dL — ABNORMAL HIGH (ref 70–99)
Glucose-Capillary: 141 mg/dL — ABNORMAL HIGH (ref 70–99)
Glucose-Capillary: 158 mg/dL — ABNORMAL HIGH (ref 70–99)
Glucose-Capillary: 162 mg/dL — ABNORMAL HIGH (ref 70–99)
Glucose-Capillary: 168 mg/dL — ABNORMAL HIGH (ref 70–99)
Glucose-Capillary: 176 mg/dL — ABNORMAL HIGH (ref 70–99)
Glucose-Capillary: 177 mg/dL — ABNORMAL HIGH (ref 70–99)
Glucose-Capillary: 180 mg/dL — ABNORMAL HIGH (ref 70–99)
Glucose-Capillary: 185 mg/dL — ABNORMAL HIGH (ref 70–99)
Glucose-Capillary: 189 mg/dL — ABNORMAL HIGH (ref 70–99)
Glucose-Capillary: 214 mg/dL — ABNORMAL HIGH (ref 70–99)
Glucose-Capillary: 255 mg/dL — ABNORMAL HIGH (ref 70–99)
Glucose-Capillary: 286 mg/dL — ABNORMAL HIGH (ref 70–99)
Glucose-Capillary: 316 mg/dL — ABNORMAL HIGH (ref 70–99)
Glucose-Capillary: 371 mg/dL — ABNORMAL HIGH (ref 70–99)

## 2023-12-10 LAB — GLUCOSE, CAPILLARY
Glucose-Capillary: 162 mg/dL — ABNORMAL HIGH (ref 70–99)
Glucose-Capillary: 209 mg/dL — ABNORMAL HIGH (ref 70–99)
Glucose-Capillary: 240 mg/dL — ABNORMAL HIGH (ref 70–99)
Glucose-Capillary: 243 mg/dL — ABNORMAL HIGH (ref 70–99)
Glucose-Capillary: 214 mg/dL — ABNORMAL HIGH (ref 70–99)
Glucose-Capillary: 194 mg/dL — ABNORMAL HIGH (ref 70–99)

## 2023-12-10 LAB — URINALYSIS, ROUTINE W REFLEX MICROSCOPIC
Bacteria, UA: NONE SEEN
Bilirubin Urine: NEGATIVE
Glucose, UA: >=500 mg/dL — AB
Hgb urine dipstick: NEGATIVE
Ketones, ur: 20 mg/dL — AB
Leukocytes,Ua: NEGATIVE
Nitrite: NEGATIVE
Protein, ur: NEGATIVE mg/dL
Specific Gravity, Urine: 1.023 (ref 1.005–1.030)
pH: 5 (ref 5.0–8.0)

## 2023-12-10 LAB — BETA-HYDROXYBUTYRIC ACID: Beta-Hydroxybutyric Acid: 2.67 mmol/L — ABNORMAL HIGH (ref 0.05–0.27)

## 2023-12-10 LAB — MAGNESIUM: Magnesium: 3.2 mg/dL — ABNORMAL HIGH (ref 1.7–2.4)

## 2023-12-10 LAB — HEMOGLOBIN A1C
Hgb A1c MFr Bld: 12.2 % — ABNORMAL HIGH (ref 4.8–5.6)
Mean Plasma Glucose: 303.44 mg/dL

## 2023-12-10 LAB — PHOSPHORUS: Phosphorus: 2.7 mg/dL (ref 2.5–4.6)

## 2023-12-10 MED ORDER — INSULIN ASPART 100 UNIT/ML IJ SOLN
0.0000 [IU] | Freq: Three times a day (TID) | INTRAMUSCULAR | Status: DC
Start: 2023-12-10 — End: 2023-12-12
  Administered 2023-12-10 – 2023-12-11 (×4): 7 [IU] via SUBCUTANEOUS
  Administered 2023-12-12: 11 [IU] via SUBCUTANEOUS

## 2023-12-10 MED ORDER — SODIUM CHLORIDE 0.45 % IV SOLN: INTRAVENOUS | Status: DC

## 2023-12-10 MED ORDER — ENOXAPARIN SODIUM 40 MG/0.4ML IJ SOSY
40.0000 mg | PREFILLED_SYRINGE | INTRAMUSCULAR | Status: DC
  Administered 2023-12-11 – 2023-12-12 (×2): 40 mg via SUBCUTANEOUS
  Filled 2023-12-10 (×2): qty 0.4

## 2023-12-10 MED ORDER — INSULIN GLARGINE-YFGN 100 UNIT/ML ~~LOC~~ SOLN
20.0000 [IU] | Freq: Every day | SUBCUTANEOUS | Status: DC
  Administered 2023-12-10 – 2023-12-12 (×3): 20 [IU] via SUBCUTANEOUS
  Filled 2023-12-10 (×3): qty 0.2

## 2023-12-10 MED ORDER — INSULIN GLARGINE-YFGN 100 UNIT/ML ~~LOC~~ SOLN
20.0000 [IU] | Freq: Every day | SUBCUTANEOUS | Status: DC
  Filled 2023-12-10 (×3): qty 0.2

## 2023-12-10 MED ORDER — INSULIN ASPART 100 UNIT/ML IJ SOLN
0.0000 [IU] | Freq: Every day | INTRAMUSCULAR | Status: DC
  Administered 2023-12-11: 4 [IU] via SUBCUTANEOUS

## 2023-12-10 NOTE — Progress Notes (Signed)
 Progress Note   Patient: Stanley Edwards NGE:[id number] DOB: 13-Sep-1965 DOA: 12/09/2023     1 DOS: the patient was seen and examined on 12/10/2023   Brief hospital course:  58 y.o. male with medical history significant for type 2 diabetes, hyperlipidemia, medication noncompliance, who presents to the ER with complaints of hyperglycemia.  Subsequently found to be in DKA.  Assessment and Plan:  Diabetic ketoacidosis - On presentation, glucose greater than 600, noted acidemia with pH 7.12, elevated beta hydroxybutyric acid.  Initiated on IV insulin  drip, DKA protocol, aggressive IV fluid hydration.  Showing improvement in glucose.  Still some acidemia.  Continue to monitor closely.  Recheck serial BMP and glucose.  Will recheck K, mag, Phos in AM.  Acute metabolic encephalopathy - Likely secondary to DKA, dehydration, hypernatremia.  Intermittent confusion.  Continue to monitor closely.  Uncontrolled insulin -dependent diabetes mellitus - A1c 12.2 suggesting very poor control.  Patient stated that he had ran out of his insulin .  Will likely need continued diabetic education after resolution of DKA.  Will work closely with patient pharmacy on ensuring patient can procure insulin .  Acute kidney injury - Likely prerenal etiology secondary to dehydration on presentation.  BUN and creatinine elevated.  Showing mild improvement.  Will recheck BMP and mag in AM.  Hypernatremia - Volume resuscitation on board with isotonic fluids.  Will recheck BMP in AM.  Medication noncompliance - History of noncompliance in the past, etiology of patient's current hospitalization as well.  Will encourage patient to be compliant with insulin .  Hyperkalemia - Resolved after insulin  initiation.  Subjective: Patient resting comfortably this morning.  Still a bit confused.  Able to respond with simple questions but unable to respond when asking about his home insulin  regiment.  Seems lethargic.  Denied pain,  vomiting, fever, shortness of breath.  Admits to nausea.  Physical Exam: Vitals:   12/10/23 1100 12/10/23 1115 12/10/23 1130 12/10/23 1145  BP: 127/69 125/69 111/73 (!) 118/57  Pulse: 87 84 88 88  Resp: 14 16 17 11   Temp:      TempSrc:      SpO2: 100% 100% 100% 100%  Weight:      Height:        GENERAL:  Alert, pleasant, no acute distress  HEENT:  EOMI CARDIOVASCULAR: Regular and tachycardic, no murmurs appreciated RESPIRATORY:  Clear to auscultation, no wheezing, rales, or rhonchi GASTROINTESTINAL:  Soft, nontender, nondistended EXTREMITIES:  No LE edema bilaterally NEURO:  No new focal deficits appreciated SKIN:  No rashes noted PSYCH:  Appropriate mood and affect     Data Reviewed:  No new imaging to review  Labs: CBC: Recent Labs  Lab 12/09/23 1652 12/09/23 1655 12/09/23 1815 12/10/23 0429  WBC 9.7  --   --  10.1  HGB 15.0 15.3 15.6 15.2  HCT 47.8 45.0 46.0 47.4  MCV 106.9*  --   --  102.6*  PLT 377  --   --  319   Basic Metabolic Panel: Recent Labs  Lab 12/09/23 1655 12/09/23 1802 12/09/23 1815 12/10/23 0012 12/10/23 0429 12/10/23 1037  NA 148* 149* 147* 153* 154* 156*  K 5.6* 5.9* 5.7* 4.5 4.2 4.0  CL 113* 105  --  111 115* 118*  CO2  --  14*  --  14* 23 28  GLUCOSE 596* 612*  --  466* 240* 199*  BUN 68* 69*  --  70* 61* 53*  CREATININE 2.80* 3.75*  --  3.33* 2.48* 2.02*  CALCIUM   --  9.5  --  9.2 9.3 9.0  MG  --   --   --   --  3.2*  --   PHOS  --   --   --   --  2.7  --    Liver Function Tests: No results for input(s): "AST", "ALT", "ALKPHOS", "BILITOT", "PROT", "ALBUMIN" in the last 168 hours. CBG: Recent Labs  Lab 12/10/23 0723 12/10/23 0836 12/10/23 0926 12/10/23 1034 12/10/23 1142  GLUCAP 180* 177* 158* 185* 162*    Scheduled Meds:  atorvastatin   40 mg Oral Daily   enoxaparin  (LOVENOX ) injection  30 mg Subcutaneous Q24H   Continuous Infusions:  dextrose  5% lactated ringers  125 mL/hr at 12/10/23 0435   insulin  3.4 Units/hr  (12/10/23 1036)   lactated ringers  Stopped (12/10/23 0436)   PRN Meds:.acetaminophen , dextrose , melatonin, polyethylene glycol, prochlorperazine  Family Communication: None at bedside  Disposition: Status is: Inpatient Remains inpatient appropriate because: DKA     Time spent: 48 minutes  Author: Jodeane Mulligan, DO 12/10/2023 12:18 PM  For on call review [email or web address].

## 2023-12-10 NOTE — Hospital Course (Signed)
 58 y.o. male with medical history significant for type 2 diabetes, hyperlipidemia, medication noncompliance, who presents to the ER with complaints of hyperglycemia.  Subsequently found to be in DKA.   Assessment and Plan:   Diabetic ketoacidosis - On presentation, glucose greater than 600, noted acidemia with pH 7.12, elevated beta hydroxybutyric acid.  Initiated on IV insulin  drip, DKA protocol, aggressive IV fluid hydration.  Showing improvement in glucose.  Still some acidemia.  Continue to monitor closely.  Recheck serial BMP and glucose.  Will recheck K, mag, Phos in AM.   Acute metabolic encephalopathy - Likely secondary to DKA, dehydration, hypernatremia.  Intermittent confusion.  Continue to monitor closely.   Uncontrolled insulin -dependent diabetes mellitus - A1c 12.2 suggesting very poor control.  Patient stated that he had ran out of his insulin .  Will likely need continued diabetic education after resolution of DKA.  Will work closely with patient pharmacy on ensuring patient can procure insulin .   Acute kidney injury - Likely prerenal etiology secondary to dehydration on presentation.  BUN and creatinine elevated.  Showing mild improvement.  Will recheck BMP and mag in AM.   Hypernatremia - Volume resuscitation on board with isotonic fluids.  Will recheck BMP in AM.   Medication noncompliance - History of noncompliance in the past, etiology of patient's current hospitalization as well.  Will encourage patient to be compliant with insulin .   Hyperkalemia - Resolved after insulin  initiation.

## 2023-12-11 ENCOUNTER — Other Ambulatory Visit (HOSPITAL_COMMUNITY): Payer: Self-pay

## 2023-12-11 DIAGNOSIS — E1165 Type 2 diabetes mellitus with hyperglycemia: Secondary | ICD-10-CM

## 2023-12-11 DIAGNOSIS — N179 Acute kidney failure, unspecified: Secondary | ICD-10-CM

## 2023-12-11 DIAGNOSIS — E870 Hyperosmolality and hypernatremia: Secondary | ICD-10-CM | POA: Diagnosis not present

## 2023-12-11 DIAGNOSIS — E1110 Type 2 diabetes mellitus with ketoacidosis without coma: Secondary | ICD-10-CM | POA: Diagnosis not present

## 2023-12-11 DIAGNOSIS — G9341 Metabolic encephalopathy: Secondary | ICD-10-CM | POA: Diagnosis not present

## 2023-12-11 DIAGNOSIS — Z794 Long term (current) use of insulin: Secondary | ICD-10-CM

## 2023-12-11 LAB — CBC
HCT: 38.2 % — ABNORMAL LOW (ref 39.0–52.0)
Hemoglobin: 12.5 g/dL — ABNORMAL LOW (ref 13.0–17.0)
MCH: 33.7 pg (ref 26.0–34.0)
MCHC: 32.7 g/dL (ref 30.0–36.0)
MCV: 103 fL — ABNORMAL HIGH (ref 80.0–100.0)
Platelets: 268 10*3/uL (ref 150–400)
RBC: 3.71 MIL/uL — ABNORMAL LOW (ref 4.22–5.81)
RDW: 11.3 % — ABNORMAL LOW (ref 11.5–15.5)
WBC: 11.9 10*3/uL — ABNORMAL HIGH (ref 4.0–10.5)
nRBC: 0 % (ref 0.0–0.2)

## 2023-12-11 LAB — GLUCOSE, CAPILLARY
Glucose-Capillary: 236 mg/dL — ABNORMAL HIGH (ref 70–99)
Glucose-Capillary: 238 mg/dL — ABNORMAL HIGH (ref 70–99)
Glucose-Capillary: 242 mg/dL — ABNORMAL HIGH (ref 70–99)
Glucose-Capillary: 307 mg/dL — ABNORMAL HIGH (ref 70–99)

## 2023-12-11 LAB — MAGNESIUM: Magnesium: 2.7 mg/dL — ABNORMAL HIGH (ref 1.7–2.4)

## 2023-12-11 LAB — BASIC METABOLIC PANEL WITH GFR
Anion gap: 8 (ref 5–15)
BUN: 31 mg/dL — ABNORMAL HIGH (ref 6–20)
CO2: 29 mmol/L (ref 22–32)
Calcium: 8.9 mg/dL (ref 8.9–10.3)
Chloride: 110 mmol/L (ref 98–111)
Creatinine, Ser: 1.46 mg/dL — ABNORMAL HIGH (ref 0.61–1.24)
GFR, Estimated: 55 mL/min — ABNORMAL LOW (ref >60)
Glucose, Bld: 119 mg/dL — ABNORMAL HIGH (ref 70–99)
Potassium: 3.7 mmol/L (ref 3.5–5.1)
Sodium: 147 mmol/L — ABNORMAL HIGH (ref 135–145)

## 2023-12-11 LAB — VITAMIN B12: Vitamin B-12: 447 pg/mL (ref 180–914)

## 2023-12-11 LAB — BETA-HYDROXYBUTYRIC ACID
Beta-Hydroxybutyric Acid: 1.32 mmol/L — ABNORMAL HIGH (ref 0.05–0.27)
Beta-Hydroxybutyric Acid: 1.99 mmol/L — ABNORMAL HIGH (ref 0.05–0.27)

## 2023-12-11 LAB — FOLATE: Folate: 4.5 ng/mL — ABNORMAL LOW (ref >5.9)

## 2023-12-11 LAB — PHOSPHORUS: Phosphorus: 2 mg/dL — ABNORMAL LOW (ref 2.5–4.6)

## 2023-12-11 MED ORDER — LIVING WELL WITH DIABETES BOOK
Freq: Once | Status: AC
  Filled 2023-12-11: qty 1

## 2023-12-11 MED ORDER — POTASSIUM CHLORIDE CRYS ER 20 MEQ PO TBCR
40.0000 meq | EXTENDED_RELEASE_TABLET | Freq: Once | ORAL | Status: AC
  Administered 2023-12-11: 40 meq via ORAL
  Filled 2023-12-11: qty 2

## 2023-12-11 MED ORDER — K PHOS MONO-SOD PHOS DI & MONO 155-852-130 MG PO TABS
500.0000 mg | ORAL_TABLET | Freq: Four times a day (QID) | ORAL | Status: AC
  Administered 2023-12-11 (×2): 500 mg via ORAL
  Filled 2023-12-11 (×3): qty 2

## 2023-12-11 NOTE — Inpatient Diabetes Management (Addendum)
 Inpatient Diabetes Program Recommendations  AACE/ADA: New Consensus Statement on Inpatient Glycemic Control   Target Ranges:  Prepandial:   less than 140 mg/dL      Peak postprandial:   less than 180 mg/dL (1-2 hours)      Critically ill patients:  140 - 180 mg/dL    Latest Reference Range & Units 12/10/23 06:22 12/10/23 07:23 12/10/23 08:36 12/10/23 09:26 12/10/23 10:34 12/10/23 11:42 12/10/23 12:59  Glucose-Capillary 70 - 99 mg/dL 562 (H) 130 (H) 865 (H) 158 (H) 185 (H) 162 (H) 168 (H)    Latest Reference Range & Units 12/10/23 14:39 12/10/23 15:51 12/10/23 16:36 12/10/23 17:40 12/10/23 18:15 12/10/23 20:01 12/10/23 21:18 12/10/23 22:48 12/11/23 08:13  Glucose-Capillary 70 - 99 mg/dL 784 (H) 696 (H) 295 (H) 209 (H) 240 (H) 243 (H) 214 (H) 194 (H) 236 (H)    Latest Reference Range & Units 12/09/23 16:55 12/09/23 18:02  CO2 22 - 32 mmol/L  14 (L)  Glucose 70 - 99 mg/dL 284 (HH) 132 (HH)  Anion gap 5 - 15   30 (H)    Latest Reference Range & Units 12/09/23 18:05 12/10/23 04:29 12/11/23 04:17  Beta-Hydroxybutyric Acid 0.05 - 0.27 mmol/L >8.00 (H) 2.67 (H) 1.32 (H)    Latest Reference Range & Units 12/10/23 04:29  Hemoglobin A1C 4.8 - 5.6 % 12.2 (H)   Review of Glycemic Control  Diabetes history: DM2 Outpatient Diabetes medications: Basaglar  20 units daily, Humalog  20 units TID Current orders for Inpatient glycemic control: Semglee  20 units daily, Novolog  0-20 units TID with meals, Novolog  0-5 units QHS  Inpatient Diabetes Program Recommendations:    Insulin : Please consider ordering Novolog  5 units TID with meals for meal coverage if patient eats at least 50% of meals.  HbgA1C:  A1C 12.2% on 12/10/23 indicating an average glucose of 303 mg/dl over the past 2-3 months.  Outpatient DM: At discharge, will need Rx for insulin  glargine pens (patient reports he had been using Basaglar ) and Humalog  Kwikpens. Patient will need Rx for glucose monitoring kit (#[id number]). Willing to use Dexcom  G7 CGM sensors (#[id number]) if prescribed (has used them in the past).  NOTE: Patient admitted with DKA with initial lab glucose of 596 mg/dl on 10/28/45. DKA treated with IV insulin  and switched to SQ insulin  regimen yesterday afternoon. Per note by Dr. Macarthur Savory on 12/10/23, patient reports he ran out of insulin . In reviewing the chart, noted telephone note on 11/27/23 which notes patient would need to be seen by PCP before insulin  refills were authorized.   Addendum 12/11/23@12 :15-Spoke with patient about diabetes and home regimen for diabetes control. Patient reports being followed by [facility]) for diabetes management. Patient reports that he ran out of Basaglar  insulin  and he could not get it refilled until he see the PCP. Patient reports that he was given an appointment for May 13 at [facility] but he missed the appointment.  Patient reports he has a pen of Humalog  left at home.   Patient is not checking glucose; he does not have a glucometer or testing supplies. Discussed htat it was noted that when he seen PCP last on 01/26/23 he was prescribed Dexcom G7. Patient states he has used Dexcom G7 sensors in past but has not used them recently.  Discussed how CGM would provide more information for PCP to use to make adjustments with insulin  regimen if needed.  Discussed A1C results (12.2% on 12/10/23) and explained that current A1C indicates an average glucose  of 303 mg/dl over the past 2-3 months. Discussed glucose and A1C goals. Discussed importance of checking CBGs and maintaining good CBG control to prevent long-term and short-term complications. Explained how hyperglycemia leads to damage within blood vessels which lead to the common complications seen with uncontrolled diabetes. Stressed to the patient the importance of improving glycemic control to prevent further complications from uncontrolled diabetes. Informed patient that I had asked outpatient [facility] pharmacy to check and see if  Basaglar  and Humalog  were covered; was told that when they run insulin  it comes back as grace period fill and quotes the full cash price. Discussed generic insulin  at [facility] (Novolin  N, R, 70/30) and informed patient they are $43 per box of 5 insulin  pens. Patient states he would prefer to get the Basaglar  and Humalog  insulin  pens but he will keep the generic insulins in mind if needed in the future.  Informed patient I would ask TOC about getting meds to bed to ensure he is able to get needed insulins prior to discharge.  Patient appreciative and verbalized understanding of information discussed and reports no further questions at this time related to diabetes.  Thanks, Beacher Limerick, RN, MSN, CDCES Diabetes Coordinator Inpatient Diabetes Program [phone] (Team Pager from 8am to 5pm)

## 2023-12-11 NOTE — Plan of Care (Signed)

## 2023-12-11 NOTE — Care Management (Signed)
 Patient appointment made with PCP and on AVS

## 2023-12-11 NOTE — Progress Notes (Signed)
 Progress Note   Patient: Stanley Edwards NFA:[id number] DOB: [birth year] DOA: 12/09/2023     2 DOS: the patient was seen and examined on 12/11/2023   Brief hospital course:  58 y.o. male with medical history significant for type 2 diabetes, hyperlipidemia, medication noncompliance, who presents to the ER with complaints of hyperglycemia.  Subsequently found to be in DKA.  Assessment and Plan:  Diabetic ketoacidosis - On presentation, glucose greater than 600, noted acidemia with pH 7.12, elevated beta hydroxybutyric acid.  Initiated on IV insulin  drip, DKA protocol, aggressive IV fluid hydration.  Showing improvement in glucose.  Anion gap has closed, CBG improved on Levemir  and insulin  sliding scale, BHA still elevated but less than admission, he was encouraged to drink IV fluids.  Acute metabolic encephalopathy - Likely secondary to DKA, dehydration, hypernatremia.  Intermittent confusion.  Continue to monitor closely. - Duration back to baseline.  Uncontrolled insulin -dependent diabetes mellitus - A1c 12.2 suggesting very poor control.  Patient stated that he had ran out of his insulin .  Will likely need continued diabetic education after resolution of DKA.  Will work closely with patient pharmacy on ensuring patient can procure insulin . - Currently patient did not receive his insulin  from community health as he was supposed to arrange follow-up with PCP before refills and he did not do so.  Acute kidney injury - Likely prerenal etiology secondary to dehydration on presentation.  BUN and creatinine elevated.  Improving  Hypernatremia - Much improved, peaked at 156 yesterday, it is 147 this morning, I will DC hypotonic- 1/2 S to avoid rapid correction -Check in a.m.  Medication noncompliance - History of noncompliance in the past, etiology of patient's current hospitalization as well.  Will encourage patient to be compliant with insulin .  Hyperkalemia - Resolved after insulin   initiation.  Hypophosphatemia - Repleted  Subjective: Reports he is feeling better this morning, but still reports generalized weakness, fatigue  Physical Exam: Vitals:   12/10/23 2318 12/11/23 0339 12/11/23 0815 12/11/23 1144  BP: 114/67 115/69 109/60 118/70  Pulse: 78 68 75 76  Resp: 18 18 18    Temp: 98.2 F (36.8 C) 98.6 F (37 C) 98.1 F (36.7 C)   TempSrc: Oral Oral Oral   SpO2: 100% 99% 99%   Weight:      Height:        Awake Alert, Oriented X 3, No new F.N deficits, Normal affect Symmetrical Chest wall movement, Good air movement bilaterally, CTAB RRR,No Gallops,Rubs or new Murmurs, No Parasternal Heave +ve B.Sounds, Abd Soft, No tenderness, No rebound - guarding or rigidity. No Cyanosis, Clubbing or edema, No new Rash or bruise     Data Reviewed:  No new imaging to review  Labs: CBC: Recent Labs  Lab 12/09/23 1652 12/09/23 1655 12/09/23 1815 12/10/23 0429 12/11/23 0417  WBC 9.7  --   --  10.1 11.9*  HGB 15.0 15.3 15.6 15.2 12.5*  HCT 47.8 45.0 46.0 47.4 38.2*  MCV 106.9*  --   --  102.6* 103.0*  PLT 377  --   --  319 268   Basic Metabolic Panel: Recent Labs  Lab 12/10/23 0429 12/10/23 1037 12/10/23 1358 12/10/23 1754 12/11/23 0417  NA 154* 156* 156* 152* 147*  K 4.2 4.0 4.1 4.5 3.7  CL 115* 118* 118* 118* 110  CO2 23 28 28  16* 29  GLUCOSE 240* 199* 169* 237* 119*  BUN 61* 53* 51* 48* 31*  CREATININE 2.48* 2.02* 1.92* 1.79* 1.46*  CALCIUM  9.3  9.0 9.1 8.8* 8.9  MG 3.2*  --   --   --  2.7*  PHOS 2.7  --   --   --  2.0*   Liver Function Tests: No results for input(s): "AST", "ALT", "ALKPHOS", "BILITOT", "PROT", "ALBUMIN" in the last 168 hours. CBG: Recent Labs  Lab 12/10/23 2001 12/10/23 2118 12/10/23 2248 12/11/23 0813 12/11/23 1144  GLUCAP 243* 214* 194* 236* 238*    Scheduled Meds:  atorvastatin   40 mg Oral Daily   enoxaparin  (LOVENOX ) injection  40 mg Subcutaneous Q24H   insulin  aspart  0-20 Units Subcutaneous TID WC    insulin  aspart  0-5 Units Subcutaneous QHS   insulin  glargine-yfgn  20 Units Subcutaneous Daily   phosphorus  500 mg Oral QID   Continuous Infusions:   PRN Meds:.acetaminophen , dextrose , melatonin, polyethylene glycol, prochlorperazine  Family Communication: None at bedside  Disposition: Status is: Inpatient Remains inpatient appropriate because: DKA       Author: Seena Dadds, MD 12/11/2023 3:06 PM  For on call review [email or web address].

## 2023-12-11 NOTE — TOC CM/SW Note (Signed)
 Transition of Care [facility]) - Inpatient Brief Assessment   Patient Details  Name: Stanley Edwards MRN: [id number] Date of Birth: 12-31-1965  Transition of [facility]) CM/SW Contact:    Jannice Mends, LCSW Phone Number: 12/11/2023, 8:56 AM   Clinical Narrative: Patient admitted from home with DKA, reporting that he ran out of insulin . He contacted his PCP and per PCP's office he was required to be seen prior to obtaining a refill. He was scheduled to be seen on 12/08/23 but does not appear to have made it to the appointment. No current TOC needs at this time but please place consult if needs arise.    Transition of Care Asessment: Insurance and Status: Insurance coverage has been reviewed Patient has primary care physician: Yes Home environment has been reviewed: From home Prior level of function:: Independent Prior/Current Home Services: No current home services Social Drivers of Health Review: SDOH reviewed no interventions necessary Readmission risk has been reviewed: Yes Transition of care needs: no transition of care needs at this time

## 2023-12-12 ENCOUNTER — Other Ambulatory Visit (HOSPITAL_COMMUNITY): Payer: Self-pay

## 2023-12-12 DIAGNOSIS — G9341 Metabolic encephalopathy: Secondary | ICD-10-CM | POA: Diagnosis not present

## 2023-12-12 DIAGNOSIS — E1110 Type 2 diabetes mellitus with ketoacidosis without coma: Secondary | ICD-10-CM | POA: Diagnosis not present

## 2023-12-12 LAB — CBC
HCT: 29.3 % — ABNORMAL LOW (ref 39.0–52.0)
Hemoglobin: 10 g/dL — ABNORMAL LOW (ref 13.0–17.0)
MCH: 34 pg (ref 26.0–34.0)
MCHC: 34.1 g/dL (ref 30.0–36.0)
MCV: 99.7 fL (ref 80.0–100.0)
Platelets: 207 10*3/uL (ref 150–400)
RBC: 2.94 MIL/uL — ABNORMAL LOW (ref 4.22–5.81)
RDW: 10.9 % — ABNORMAL LOW (ref 11.5–15.5)
WBC: 5.5 10*3/uL (ref 4.0–10.5)
nRBC: 0 % (ref 0.0–0.2)

## 2023-12-12 LAB — BASIC METABOLIC PANEL WITH GFR
Anion gap: 5 (ref 5–15)
BUN: 27 mg/dL — ABNORMAL HIGH (ref 6–20)
CO2: 28 mmol/L (ref 22–32)
Calcium: 8.2 mg/dL — ABNORMAL LOW (ref 8.9–10.3)
Chloride: 105 mmol/L (ref 98–111)
Creatinine, Ser: 1.23 mg/dL (ref 0.61–1.24)
GFR, Estimated: >60 mL/min (ref >60)
Glucose, Bld: 249 mg/dL — ABNORMAL HIGH (ref 70–99)
Potassium: 3.9 mmol/L (ref 3.5–5.1)
Sodium: 138 mmol/L (ref 135–145)

## 2023-12-12 LAB — PHOSPHORUS: Phosphorus: 2.5 mg/dL (ref 2.5–4.6)

## 2023-12-12 LAB — GLUCOSE, CAPILLARY: Glucose-Capillary: 274 mg/dL — ABNORMAL HIGH (ref 70–99)

## 2023-12-12 MED ORDER — INSULIN LISPRO (1 UNIT DIAL) 100 UNIT/ML (KWIKPEN)
5.0000 [IU] | PEN_INJECTOR | Freq: Three times a day (TID) | SUBCUTANEOUS | 0 refills | Status: DC
  Filled 2023-12-12: qty 3, 20d supply, fill #0

## 2023-12-12 MED ORDER — INSULIN GLARGINE-YFGN 100 UNIT/ML ~~LOC~~ SOPN
20.0000 [IU] | PEN_INJECTOR | Freq: Every day | SUBCUTANEOUS | 0 refills | Status: DC
  Filled 2023-12-12: qty 6, 30d supply, fill #0

## 2023-12-12 MED ORDER — ONETOUCH VERIO FLEX SYSTEM W/DEVICE KIT
PACK | 0 refills | Status: DC
  Filled 2023-12-12: qty 1, 30d supply, fill #0

## 2023-12-12 MED ORDER — INSULIN PEN NEEDLE 32G X 4 MM MISC
0 refills | Status: DC
  Filled 2023-12-12: qty 100, 25d supply, fill #0

## 2023-12-12 MED ORDER — DEXCOM G7 SENSOR MISC
6 refills | Status: AC
  Filled 2023-12-12: qty 3, 30d supply, fill #0

## 2023-12-12 MED ORDER — ONETOUCH VERIO VI STRP
ORAL_STRIP | 12 refills | Status: DC
  Filled 2023-12-12: qty 50, 16d supply, fill #0

## 2023-12-12 MED ORDER — ONETOUCH DELICA PLUS LANCET33G MISC
11 refills | Status: DC
  Filled 2023-12-12: qty 100, 30d supply, fill #0

## 2023-12-12 MED ORDER — ATORVASTATIN CALCIUM 40 MG PO TABS
40.0000 mg | ORAL_TABLET | Freq: Every day | ORAL | 0 refills | Status: DC
Start: 2023-12-12 — End: 2024-02-24
  Filled 2023-12-12: qty 90, 90d supply, fill #0

## 2023-12-12 NOTE — Plan of Care (Signed)

## 2023-12-12 NOTE — Discharge Instructions (Signed)
 Follow with Primary MD Joaquin Mulberry, MD in 7 days   Get CBC, CMP,  checked  by Primary MD next visit.    Activity: As tolerated with Full fall precautions use walker/cane & assistance as needed   Disposition Home    Diet: Heart Healthy/Carb modified   On your next visit with your primary care physician please Get Medicines reviewed and adjusted.   Please request your Prim.MD to go over all Hospital Tests and Procedure/Radiological results at the follow up, please get all Hospital records sent to your Prim MD by signing hospital release before you go home.   If you experience worsening of your admission symptoms, develop shortness of breath, life threatening emergency, suicidal or homicidal thoughts you must seek medical attention immediately by calling 911 or calling your MD immediately  if symptoms less severe.  You Must read complete instructions/literature along with all the possible adverse reactions/side effects for all the Medicines you take and that have been prescribed to you. Take any new Medicines after you have completely understood and accpet all the possible adverse reactions/side effects.   Do not drive, operating heavy machinery, perform activities at heights, swimming or participation in water  activities or provide baby sitting services if your were admitted for syncope or siezures until you have seen by Primary MD or a Neurologist and advised to do so again.  Do not drive when taking Pain medications.    Do not take more than prescribed Pain, Sleep and Anxiety Medications  Special Instructions: If you have smoked or chewed Tobacco  in the last 2 yrs please stop smoking, stop any regular Alcohol   and or any Recreational drug use.  Wear Seat belts while driving.   Please note  You were cared for by a hospitalist during your hospital stay. If you have any questions about your discharge medications or the care you received while you were in the hospital after you  are discharged, you can call the unit and asked to speak with the hospitalist on call if the hospitalist that took care of you is not available. Once you are discharged, your primary care physician will handle any further medical issues. Please note that NO REFILLS for any discharge medications will be authorized once you are discharged, as it is imperative that you return to your primary care physician (or establish a relationship with a primary care physician if you do not have one) for your aftercare needs so that they can reassess your need for medications and monitor your lab values.

## 2023-12-12 NOTE — Plan of Care (Signed)

## 2023-12-12 NOTE — Discharge Summary (Signed)
 Physician Discharge Summary  Stanley Edwards:[id number] DOB: [birth year] DOA: 12/09/2023  PCP: Joaquin Mulberry, MD  Admit date: 12/09/2023 Discharge date: 12/12/2023  Admitted From: (Home) Disposition:  (Home )  Recommendations for Outpatient Follow-up:  Follow up with PCP, appointment scheduled for 6/11 Please obtain BMP/CBC during next visit Please continue counseling about medication compliance, and physician follow-up compliance, he was already counseled as well during hospital stay Please consider ACE or ARB inhibitors if indicated as an outpatient and renal function is stable  Diet recommendation: Heart Healthy / Carb Modified   Brief/Interim Summary:  58 y.o. male with medical history significant for type 2 diabetes, hyperlipidemia, medication noncompliance, who presents to the ER with complaints of hyperglycemia.  Subsequently found to be in DKA.   Diabetic ketoacidosis - On presentation, glucose greater than 600, noted acidemia with pH 7.12, elevated beta hydroxybutyric acid.  Initiated on IV insulin  drip, DKA protocol, aggressive IV fluid hydration.  Showing improvement in glucose.  Anion gap has closed, CBG improved on Levemir  and insulin  sliding scale, BHA still elevated but less than admission, EKG has resolved at time of discharge.  Uncontrolled insulin -dependent diabetes mellitus - A1c 12.2 suggesting very poor control.  Patient stated that he had ran out of his insulin .  But it does appear he failed to schedule an appointment with PCP for more than a year, and he still faild to do so , he was counseled at length about that.   - Patient was dispensed insulin  from [facility] pharmacy prior to discharge  Acute metabolic encephalopathy - Likely secondary to DKA, dehydration, hypernatremia.  Intermittent confusion.  -  back to baseline.    Acute kidney injury - Likely prerenal etiology secondary to dehydration on presentation.  BUN and creatinine elevated.  Resolved,  renal function back to baseline   Hypernatremia - Much improved, peaked at 156 . - Due to dehydration from DKA     Medication noncompliance - History of noncompliance in the past, etiology of patient's current hospitalization as well.  Will encourage patient to be compliant with insulin  and PCP follow-ups   Hyperkalemia - Resolved after insulin  initiation.   Hypophosphatemia - Repleted  Hyperlipidemia -Resumed statin, meds were dispensed by [facility] pharmacy on discharge    Discharge Diagnoses:  Active Problems:   Diabetic ketoacidosis without coma associated with type 2 diabetes mellitus (HCC)   AKI (acute kidney injury) (HCC)   Acute metabolic encephalopathy   Hypernatremia   Uncontrolled type 2 diabetes mellitus with hyperglycemia, with long-term current use of insulin  [facility])    Discharge Instructions  Discharge Instructions     Diet - low sodium heart healthy   Complete by: As directed    Discharge instructions   Complete by: As directed    Follow with Primary MD Joaquin Mulberry, MD in 7 days   Get CBC, CMP,  checked  by Primary MD next visit.    Activity: As tolerated with Full fall precautions use walker/cane & assistance as needed   Disposition Home    Diet: Heart Healthy/Carb modified   On your next visit with your primary care physician please Get Medicines reviewed and adjusted.   Please request your Prim.MD to go over all Hospital Tests and Procedure/Radiological results at the follow up, please get all Hospital records sent to your Prim MD by signing hospital release before you go home.   If you experience worsening of your admission symptoms, develop shortness of breath, life threatening emergency, suicidal or homicidal  thoughts you must seek medical attention immediately by calling 911 or calling your MD immediately  if symptoms less severe.  You Must read complete instructions/literature along with all the possible adverse reactions/side effects  for all the Medicines you take and that have been prescribed to you. Take any new Medicines after you have completely understood and accpet all the possible adverse reactions/side effects.   Do not drive, operating heavy machinery, perform activities at heights, swimming or participation in water  activities or provide baby sitting services if your were admitted for syncope or siezures until you have seen by Primary MD or a Neurologist and advised to do so again.  Do not drive when taking Pain medications.    Do not take more than prescribed Pain, Sleep and Anxiety Medications  Special Instructions: If you have smoked or chewed Tobacco  in the last 2 yrs please stop smoking, stop any regular Alcohol   and or any Recreational drug use.  Wear Seat belts while driving.   Please note  You were cared for by a hospitalist during your hospital stay. If you have any questions about your discharge medications or the care you received while you were in the hospital after you are discharged, you can call the unit and asked to speak with the hospitalist on call if the hospitalist that took care of you is not available. Once you are discharged, your primary care physician will handle any further medical issues. Please note that NO REFILLS for any discharge medications will be authorized once you are discharged, as it is imperative that you return to your primary care physician (or establish a relationship with a primary care physician if you do not have one) for your aftercare needs so that they can reassess your need for medications and monitor your lab values.   Increase activity slowly   Complete by: As directed       Allergies as of 12/12/2023   No Known Allergies      Medication List     TAKE these medications    atorvastatin  40 MG tablet Commonly known as: LIPITOR Take 1 tablet (40 mg total) by mouth daily.   Contour Next Test test strip Generic drug: glucose blood Use as instructed    Dexcom G7 Sensor Misc Change sensor every 10 days   insulin  glargine-yfgn 100 UNIT/ML Pen Commonly known as: Semglee  (yfgn) Inject 20 Units into the skin daily. Please make an appointment for more refills.   insulin  lispro 100 UNIT/ML KwikPen Commonly known as: HUMALOG  Inject 5 Units into the skin 3 (three) times daily before meals. What changed: how much to take   Insulin  Pen Needle 32G X 4 MM Misc Use to inject insulin  4 times daily.   OneTouch Delica Plus Lancet33G Misc Use to check blood sugar 3 times a day   OneTouch Verio w/Device Kit Use to check blood sugar 3 times daily        Follow-up Information     Collins Dean, NP Follow up on 01/06/2024.   Specialty: Nurse Practitioner Why: 3"10 pm please do not skip this appointment, if you need to reschedule or change please call hte office at least a week ahead of time. Arrive 15 minutes early, bring a form of payment just in case bring insurance card and photo ID Contact information: [address] [place] [address] [place] [address] [phone]                No Known Allergies  Consultations: None   Procedures/Studies: No results found.  Subjective:  No significant events overnight, he denies any complaints Discharge Exam: Vitals:   12/12/23 0337 12/12/23 0825  BP: 129/72 117/60  Pulse:  91  Resp: 16 16  Temp: 98.2 F (36.8 C) 98.8 F (37.1 C)  SpO2: 100% 93%   Vitals:   12/11/23 1926 12/11/23 2300 12/12/23 0337 12/12/23 0825  BP: 113/72 114/61 129/72 117/60  Pulse:    91  Resp: 20  16 16   Temp: 97.8 F (36.6 C) 98.2 F (36.8 C) 98.2 F (36.8 C) 98.8 F (37.1 C)  TempSrc: Oral Oral Oral Oral  SpO2: 98% 100% 100% 93%  Weight:      Height:        General: Pt is alert, awake, not in acute distress Cardiovascular: RRR, S1/S2 +, no rubs, no gallops Respiratory: CTA bilaterally, no wheezing, no rhonchi Abdominal: Soft, NT, ND, bowel sounds + Extremities: no edema, no  cyanosis    The results of significant diagnostics from this hospitalization (including imaging, microbiology, ancillary and laboratory) are listed below for reference.     Microbiology: No results found for this or any previous visit (from the past 240 hours).   Labs: BNP (last 3 results) No results for input(s): "BNP" in the last 8760 hours. Basic Metabolic Panel: Recent Labs  Lab 12/10/23 0429 12/10/23 1037 12/10/23 1358 12/10/23 1754 12/11/23 0417 12/12/23 0408  NA 154* 156* 156* 152* 147* 138  K 4.2 4.0 4.1 4.5 3.7 3.9  CL 115* 118* 118* 118* 110 105  CO2 23 28 28  16* 29 28  GLUCOSE 240* 199* 169* 237* 119* 249*  BUN 61* 53* 51* 48* 31* 27*  CREATININE 2.48* 2.02* 1.92* 1.79* 1.46* 1.23  CALCIUM  9.3 9.0 9.1 8.8* 8.9 8.2*  MG 3.2*  --   --   --  2.7*  --   PHOS 2.7  --   --   --  2.0* 2.5   Liver Function Tests: No results for input(s): "AST", "ALT", "ALKPHOS", "BILITOT", "PROT", "ALBUMIN" in the last 168 hours. No results for input(s): "LIPASE", "AMYLASE" in the last 168 hours. No results for input(s): "AMMONIA" in the last 168 hours. CBC: Recent Labs  Lab 12/09/23 1652 12/09/23 1655 12/09/23 1815 12/10/23 0429 12/11/23 0417 12/12/23 0408  WBC 9.7  --   --  10.1 11.9* 5.5  HGB 15.0 15.3 15.6 15.2 12.5* 10.0*  HCT 47.8 45.0 46.0 47.4 38.2* 29.3*  MCV 106.9*  --   --  102.6* 103.0* 99.7  PLT 377  --   --  319 268 207   Cardiac Enzymes: No results for input(s): "CKTOTAL", "CKMB", "CKMBINDEX", "TROPONINI" in the last 168 hours. BNP: Invalid input(s): "POCBNP" CBG: Recent Labs  Lab 12/11/23 0813 12/11/23 1144 12/11/23 1546 12/11/23 2103 12/12/23 0822  GLUCAP 236* 238* 242* 307* 274*   D-Dimer No results for input(s): "DDIMER" in the last 72 hours. Hgb A1c Recent Labs    12/10/23 0429  HGBA1C 12.2*   Lipid Profile No results for input(s): "CHOL", "HDL", "LDLCALC", "TRIG", "CHOLHDL", "LDLDIRECT" in the last 72 hours. Thyroid function  studies No results for input(s): "TSH", "T4TOTAL", "T3FREE", "THYROIDAB" in the last 72 hours.  Invalid input(s): "FREET3" Anemia work up Recent Labs    12/11/23 0417  VITAMINB12 447  FOLATE 4.5*   Urinalysis    Component Value Date/Time   COLORURINE YELLOW 12/10/2023 1230   APPEARANCEUR HAZY (A) 12/10/2023 1230   LABSPEC 1.023 12/10/2023 1230  PHURINE 5.0 12/10/2023 1230   GLUCOSEU >=500 (A) 12/10/2023 1230   HGBUR NEGATIVE 12/10/2023 1230   BILIRUBINUR NEGATIVE 12/10/2023 1230   BILIRUBINUR negative 07/10/2022 1216   KETONESUR 20 (A) 12/10/2023 1230   PROTEINUR NEGATIVE 12/10/2023 1230   UROBILINOGEN 0.2 07/10/2022 1216   NITRITE NEGATIVE 12/10/2023 1230   LEUKOCYTESUR NEGATIVE 12/10/2023 1230   Sepsis Labs Recent Labs  Lab 12/09/23 1652 12/10/23 0429 12/11/23 0417 12/12/23 0408  WBC 9.7 10.1 11.9* 5.5   Microbiology No results found for this or any previous visit (from the past 240 hours).   Time coordinating discharge: Over 30 minutes  SIGNED:   Seena Dadds, MD  [facility] 12/12/2023, 10:00 AM Pager   If 7PM-7AM, please contact night-coverage [email or web address]

## 2023-12-14 ENCOUNTER — Telehealth: Payer: Self-pay | Admitting: *Deleted

## 2023-12-14 NOTE — Transitions of Care (Post Inpatient/ED Visit) (Signed)
   12/14/2023  Name: Stanley Edwards MRN: [id number] DOB: June 09, 1966  Today's TOC FU Call Status: Today's TOC FU Call Status:: Unsuccessful Call (1st Attempt) Unsuccessful Call (1st Attempt) Date: 12/14/23  Attempted to reach the patient regarding the most recent Inpatient/ED visit.  Follow Up Plan: Additional outreach attempts will be made to reach the patient to complete the Transitions of Care (Post Inpatient/ED visit) call.   Arna Better RN, BSN [place]  [facility] Health RN Care Manager [phone]

## 2023-12-15 ENCOUNTER — Telehealth: Payer: Self-pay | Admitting: *Deleted

## 2023-12-15 NOTE — Transitions of Care (Post Inpatient/ED Visit) (Signed)
   12/15/2023  Name: OKECHUKWU REGNIER MRN: [id number] DOB: [birth year]  Today's TOC FU Call Status: Today's TOC FU Call Status:: Unsuccessful Call (2nd Attempt) Unsuccessful Call (2nd Attempt) Date: 12/15/23  Attempted to reach the patient regarding the most recent Inpatient/ED visit.  Follow Up Plan: Additional outreach attempts will be made to reach the patient to complete the Transitions of Care (Post Inpatient/ED visit) call.   Arna Better RN, BSN [place]  [facility] Health RN Care Manager [phone]

## 2023-12-15 NOTE — Telephone Encounter (Signed)
 Copied from CRM [id number]. Topic: General - Call Back - No Documentation >> Dec 15, 2023 12:23 PM Minus Amel G wrote:  Reason for CRM: missed a call from the office, in the notes it stated to complete the transfer of care

## 2023-12-16 ENCOUNTER — Telehealth: Payer: Self-pay | Admitting: *Deleted

## 2023-12-16 NOTE — Transitions of Care (Post Inpatient/ED Visit) (Signed)
   12/16/2023  Name: LENON KUENNEN MRN: [id number] DOB: 1966-07-17  Today's TOC FU Call Status: Today's TOC FU Call Status:: Unsuccessful Call (3rd Attempt) Unsuccessful Call (3rd Attempt) Date: 12/16/23  Attempted to reach the patient regarding the most recent Inpatient/ED visit.  Follow Up Plan: No further outreach attempts will be made at this time. We have been unable to contact the patient.  Arna Better RN, BSN [place]  [facility] Health RN Care Manager [phone]

## 2024-01-06 ENCOUNTER — Inpatient Hospital Stay: Admitting: Nurse Practitioner

## 2024-01-11 ENCOUNTER — Telehealth: Payer: Self-pay | Admitting: Family Medicine

## 2024-01-11 NOTE — Telephone Encounter (Unsigned)
 Copied from CRM [id number]. Topic: Clinical - Medication Refill >> Jan 11, 2024  9:07 AM Alica Antu wrote: Medication: insulin  glargine-yfgn (SEMGLEE , YFGN,) 100 UNIT/ML Pen, insulin  lispro (HUMALOG ) 100 UNIT/ML KwikPen  Has the patient contacted their pharmacy? Yes (Agent: If no, request that the patient contact the pharmacy for the refill. If patient does not wish to contact the pharmacy document the reason why and proceed with request.) (Agent: If yes, when and what did the pharmacy advise?)  This is the patient's preferred pharmacy:  [facility] [address] [place] [address] Phone: [phone] Fax: [phone]    Is this the correct pharmacy for this prescription? Yes If no, delete pharmacy and type the correct one.   Has the prescription been filled recently? Yes  Is the patient out of the medication? Yes  Has the patient been seen for an appointment in the last year OR does the patient have an upcoming appointment? Yes  Can we respond through MyChart? No  Agent: Please be advised that Rx refills may take up to 3 business days. We ask that you follow-up with your pharmacy.

## 2024-01-13 NOTE — Telephone Encounter (Signed)
 Requested medication (s) are due for refill today: routing for review  Requested medication (s) are on the active medication list: yes  Last refill:  12/12/23  Future visit scheduled: yes  Notes to clinic:  Unable to refill per protocol, last refill by another provider.      Requested Prescriptions  Pending Prescriptions Disp Refills   insulin  glargine-yfgn (SEMGLEE , YFGN,) 100 UNIT/ML Pen 18 mL 0    Sig: Inject 20 Units into the skin daily. Please make an appointment for more refills.     Off-Protocol Failed - 01/13/2024  2:11 PM      Failed - Medication not assigned to a protocol, review manually.      Passed - Valid encounter within last 12 months    Recent Outpatient Visits           11 months ago Type 2 diabetes mellitus with hypoglycemia without coma, with long-term current use of insulin  [facility])   [facility] [place]. [facility] Concetta Dee B, MD   1 year ago Type 2 diabetes mellitus with hyperglycemia, with long-term current use of insulin  [facility])   [facility] Of [place]. [facility] Joaquin Mulberry, MD   1 year ago Type 2 diabetes mellitus with hyperglycemia, with long-term current use of insulin  [facility])   [facility] - [facility] Dept Of [place]. [facility] [place], [place], [place]   2 years ago Type 2 diabetes mellitus with hyperglycemia, with long-term current use of insulin  [facility])   [facility] - [facility] Dept Of [place]. [facility] Joaquin Mulberry, MD   2 years ago Screening for colon cancer   [facility] Of [place]. [facility] Joaquin Mulberry, MD               insulin  lispro (HUMALOG ) 100 UNIT/ML KwikPen 12 mL 0    Sig: Inject 5 Units into the skin 3 (three) times daily before meals.     Endocrinology:  Diabetes - Insulins Failed - 01/13/2024  2:11 PM      Failed - HBA1C is between 0 and  7.9 and within 180 days    HbA1c, POC (controlled diabetic range)  Date Value Ref Range Status  01/26/2023 8.4 (A) 0.0 - 7.0 % Final   Hgb A1c MFr Bld  Date Value Ref Range Status  12/10/2023 12.2 (H) 4.8 - 5.6 % Final    Comment:    (NOTE) Pre diabetes:          5.7%-6.4%  Diabetes:              >6.4%  Glycemic control for   <7.0% adults with diabetes          Failed - Valid encounter within last 6 months    Recent Outpatient Visits           11 months ago Type 2 diabetes mellitus with hypoglycemia without coma, with long-term current use of insulin  (HCC)   [facility] Of [place]. [facility] Concetta Dee B, MD   1 year ago Type 2 diabetes mellitus with hyperglycemia, with long-term current use of insulin  [facility])   [facility] [facility] Dept Of . [facility] Joaquin Mulberry, MD   1 year ago Type 2 diabetes  mellitus with hyperglycemia, with long-term current use of insulin  [facility])   [facility] [facility] - A Dept Of [place]. [facility] [place], [place], [place]   2 years ago Type 2 diabetes mellitus with hyperglycemia, with long-term current use of insulin  [facility])   [place] [facility] - A Dept Of [place]. [facility] Joaquin Mulberry, MD   2 years ago Screening for colon cancer   [facility] - [facility] Dept Of [place]. [facility] Joaquin Mulberry, MD

## 2024-01-25 ENCOUNTER — Encounter (INDEPENDENT_AMBULATORY_CARE_PROVIDER_SITE_OTHER): Payer: Self-pay | Admitting: Primary Care

## 2024-01-25 ENCOUNTER — Ambulatory Visit: Attending: Family Medicine | Admitting: Pharmacist

## 2024-01-25 ENCOUNTER — Ambulatory Visit (INDEPENDENT_AMBULATORY_CARE_PROVIDER_SITE_OTHER): Payer: Self-pay | Admitting: Primary Care

## 2024-01-25 ENCOUNTER — Other Ambulatory Visit: Payer: Self-pay

## 2024-01-25 VITALS — BP 122/80 | HR 73 | Resp 16 | Ht 74.0 in | Wt 183.4 lb

## 2024-01-25 DIAGNOSIS — Z794 Long term (current) use of insulin: Secondary | ICD-10-CM

## 2024-01-25 DIAGNOSIS — Z09 Encounter for follow-up examination after completed treatment for conditions other than malignant neoplasm: Secondary | ICD-10-CM

## 2024-01-25 DIAGNOSIS — Z7189 Other specified counseling: Secondary | ICD-10-CM

## 2024-01-25 DIAGNOSIS — Z760 Encounter for issue of repeat prescription: Secondary | ICD-10-CM

## 2024-01-25 DIAGNOSIS — I152 Hypertension secondary to endocrine disorders: Secondary | ICD-10-CM

## 2024-01-25 DIAGNOSIS — E1110 Type 2 diabetes mellitus with ketoacidosis without coma: Secondary | ICD-10-CM

## 2024-01-25 DIAGNOSIS — E1165 Type 2 diabetes mellitus with hyperglycemia: Secondary | ICD-10-CM

## 2024-01-25 LAB — GLUCOSE, POCT (MANUAL RESULT ENTRY): POC Glucose: 176 mg/dL — AB (ref 70–99)

## 2024-01-25 MED ORDER — INSULIN GLARGINE-YFGN 100 UNIT/ML ~~LOC~~ SOPN
20.0000 [IU] | PEN_INJECTOR | Freq: Every day | SUBCUTANEOUS | 0 refills | Status: DC
  Filled 2024-01-25: qty 3, 15d supply, fill #0
  Filled 2024-01-25: qty 18, 90d supply, fill #0

## 2024-01-25 MED ORDER — BASAGLAR KWIKPEN 100 UNIT/ML ~~LOC~~ SOPN
20.0000 [IU] | PEN_INJECTOR | Freq: Every day | SUBCUTANEOUS | 1 refills | Status: DC
  Filled 2024-01-25: qty 6, 30d supply, fill #0
  Filled 2024-02-19: qty 6, 30d supply, fill #1

## 2024-01-25 MED ORDER — TRUE METRIX METER W/DEVICE KIT
PACK | 0 refills | Status: AC
  Filled 2024-01-25: qty 1, 30d supply, fill #0

## 2024-01-25 MED ORDER — TRUE METRIX BLOOD GLUCOSE TEST VI STRP
ORAL_STRIP | 6 refills | Status: AC
  Filled 2024-01-25: qty 100, 33d supply, fill #0

## 2024-01-25 MED ORDER — DEXCOM G7 RECEIVER DEVI
0 refills | Status: AC
Start: 2024-01-25 — End: ?
  Filled 2024-01-25: qty 1, fill #0

## 2024-01-25 MED ORDER — TRUEPLUS LANCETS 28G MISC
6 refills | Status: AC
  Filled 2024-01-25: qty 100, 33d supply, fill #0

## 2024-01-25 MED ORDER — INSULIN LISPRO (1 UNIT DIAL) 100 UNIT/ML (KWIKPEN)
5.0000 [IU] | PEN_INJECTOR | Freq: Three times a day (TID) | SUBCUTANEOUS | 0 refills | Status: DC
  Filled 2024-01-25: qty 3, 20d supply, fill #0
  Filled 2024-01-25: qty 6, 40d supply, fill #0
  Filled 2024-02-19: qty 3, 20d supply, fill #1

## 2024-01-25 NOTE — Progress Notes (Signed)
 Patient was educated on the use of the Dexcom blood glucose meter. Reviewed necessary supplies and operation of the meter.   Time spent counseling: 15 minutes   Herlene Fleeta Morris, PharmD, [place], CPP Clinical Pharmacist [facility] [phone]

## 2024-01-25 NOTE — Progress Notes (Signed)
 Subjective:   Stanley Edwards is a 58 y.o. male presents for hospital follow up.  PCP unavailable will be seeing for medication refill and follow-up with Dr. Ethyl.  Patient admitted to the hospital was 12/09/23, patient was discharged from the hospital on 12/12/23, patient was admitted for:, hyperglycemic with serum glucose greater than 600, hyperkalemia he was in DKA. A1C 12.2 12/10/23. Denies polyuria, polydipsia, polyphasia or vision changes.  Does not check blood sugars at home.   Past Medical History:  Diagnosis Date   Allergy    Diabetes mellitus without complication (HCC)    Hypertension    Pt denies htn but it was on his previous hx     No Known Allergies  Current Outpatient Medications on File Prior to Visit  Medication Sig Dispense Refill   atorvastatin  (LIPITOR) 40 MG tablet Take 1 tablet (40 mg total) by mouth daily. 90 tablet 0   Continuous Glucose Sensor (DEXCOM G7 SENSOR) MISC Use to check blood sugar as directed. Change sensor every 10 days 3 each 6   Insulin  Pen Needle 32G X 4 MM MISC Use to inject insulin  4 times daily. 100 each 0   No current facility-administered medications on file prior to visit.    Review of System: ROS Comprehensive ROS Pertinent positive and negative noted in HPI   Objective:  BP 122/80   Pulse 73   Resp 16   Ht 6' 2 (1.88 m)   Wt 183 lb 6.4 oz (83.2 kg)   SpO2 99%   BMI 23.55 kg/m    Physical Exam Vitals reviewed.  Constitutional:      Appearance: He is obese.  HENT:     Head: Normocephalic.     Right Ear: Tympanic membrane and external ear normal.     Left Ear: Tympanic membrane and external ear normal.     Nose: Nose normal.   Eyes:     Extraocular Movements: Extraocular movements intact.     Pupils: Pupils are equal, round, and reactive to light.    Cardiovascular:     Rate and Rhythm: Normal rate and regular rhythm.  Pulmonary:     Effort: Pulmonary effort is normal.     Breath sounds: Normal breath sounds.   Abdominal:     General: Bowel sounds are normal. There is distension.     Palpations: Abdomen is soft.   Musculoskeletal:        General: Normal range of motion.   Skin:    General: Skin is warm and dry.   Neurological:     Mental Status: He is oriented to person, place, and time.   Psychiatric:        Mood and Affect: Mood normal.        Behavior: Behavior normal.        Thought Content: Thought content normal.        Judgment: Judgment normal.      Assessment:  Stanley Edwards was seen today for hospitalization follow-up.  Diagnoses and all orders for this visit:  Hospital discharge follow-up -     CBC with Differential  Diabetic ketoacidosis without coma associated with type 2 diabetes mellitus (HCC) -     Basic Metabolic Panel  Hypertension due to endocrine disorder -     Basic Metabolic Panel  Medication refill  Uncontrolled type 2 diabetes mellitus with hyperglycemia, with long-term current use of insulin  (HCC)  Complications from uncontrolled diabetes -diabetic retinopathy leading to blindness, diabetic nephropathy leading to dialysis,  decrease in circulation decrease in sores or wound healing which may lead to amputations and increase of heart attack and stroke . Effects on the liver- non-alcoholic fatty liver disease (NAFLD), which can progress to non-alcoholic steatohepatitis (NASH), cirrhosis, liver failure, and even liver cancer , lead to fat accumulation in the liver, causing NAFLD, and if left untreated, can lead to more severe liver damage.  -     Lipid panel -     Microalbumin / creatinine urine ratio -     POCT glucose (manual entry) -     insulin  glargine-yfgn (SEMGLEE , YFGN,) 100 UNIT/ML Pen; Inject 20 Units into the skin daily. Please make an appointment for more refills. -     insulin  lispro (HUMALOG ) 100 UNIT/ML KwikPen; Inject 5 Units into the skin 3 (three) times daily before meals.     This note has been created with Stage manager. Any transcriptional errors are unintentional.  Follow up with PCP  Rosaline SHAUNNA Bohr, NP 01/25/2024, 2:31 PM

## 2024-01-25 NOTE — Patient Instructions (Signed)
 Complications from uncontrolled diabetes -diabetic retinopathy leading to blindness, diabetic nephropathy leading to dialysis, decrease in circulation decrease in sores or wound healing which may lead to amputations and increase of heart attack and stroke . Effects on the liver- non-alcoholic fatty liver disease (NAFLD), which can progress to non-alcoholic steatohepatitis (NASH), cirrhosis, liver failure, and even liver cancer , lead to fat accumulation in the liver, causing NAFLD, and if left untreated, can lead to more severe liver damage.

## 2024-01-27 LAB — BASIC METABOLIC PANEL WITH GFR
BUN/Creatinine Ratio: 10 (ref 9–20)
BUN: 13 mg/dL (ref 6–24)
CO2: 21 mmol/L (ref 20–29)
Calcium: 9.5 mg/dL (ref 8.7–10.2)
Chloride: 102 mmol/L (ref 96–106)
Creatinine, Ser: 1.3 mg/dL — ABNORMAL HIGH (ref 0.76–1.27)
Glucose: 165 mg/dL — ABNORMAL HIGH (ref 70–99)
Potassium: 5 mmol/L (ref 3.5–5.2)
Sodium: 138 mmol/L (ref 134–144)
eGFR: 64 mL/min/{1.73_m2} (ref >59)

## 2024-01-27 LAB — CBC WITH DIFFERENTIAL/PLATELET
Basophils Absolute: 0.1 10*3/uL (ref 0.0–0.2)
Basos: 2 %
EOS (ABSOLUTE): 0.2 10*3/uL (ref 0.0–0.4)
Eos: 6 %
Hematocrit: 38.2 % (ref 37.5–51.0)
Hemoglobin: 12.5 g/dL — ABNORMAL LOW (ref 13.0–17.7)
Immature Grans (Abs): 0 10*3/uL (ref 0.0–0.1)
Immature Granulocytes: 0 %
Lymphocytes Absolute: 1.4 10*3/uL (ref 0.7–3.1)
Lymphs: 40 %
MCH: 34.5 pg — ABNORMAL HIGH (ref 26.6–33.0)
MCHC: 32.7 g/dL (ref 31.5–35.7)
MCV: 106 fL — ABNORMAL HIGH (ref 79–97)
Monocytes Absolute: 0.3 10*3/uL (ref 0.1–0.9)
Monocytes: 9 %
Neutrophils Absolute: 1.5 10*3/uL (ref 1.4–7.0)
Neutrophils: 43 %
Platelets: 335 10*3/uL (ref 150–450)
RBC: 3.62 x10E6/uL — ABNORMAL LOW (ref 4.14–5.80)
RDW: 11.4 % — ABNORMAL LOW (ref 11.6–15.4)
WBC: 3.5 10*3/uL (ref 3.4–10.8)

## 2024-01-27 LAB — LIPID PANEL
Chol/HDL Ratio: 4.2 ratio (ref 0.0–5.0)
Cholesterol, Total: 186 mg/dL (ref 100–199)
HDL: 44 mg/dL (ref >39)
LDL Chol Calc (NIH): 130 mg/dL — ABNORMAL HIGH (ref 0–99)
Triglycerides: 63 mg/dL (ref 0–149)
VLDL Cholesterol Cal: 12 mg/dL (ref 5–40)

## 2024-01-27 LAB — MICROALBUMIN / CREATININE URINE RATIO
Creatinine, Urine: 356.4 mg/dL
Microalb/Creat Ratio: 9 mg/g{creat} (ref 0–29)
Microalbumin, Urine: 31.2 ug/mL

## 2024-01-30 ENCOUNTER — Ambulatory Visit: Payer: Self-pay | Admitting: Primary Care

## 2024-02-19 ENCOUNTER — Other Ambulatory Visit: Payer: Self-pay

## 2024-02-23 ENCOUNTER — Telehealth: Payer: Self-pay | Admitting: Family Medicine

## 2024-02-23 NOTE — Telephone Encounter (Signed)
 Called patient, no answer. Left voicemail confirming upcoming appointment on 02/24/2024 at 9:30 am. Provided callback number for any questions or changes.   Please confirm appointment if patient calls. Thank you.

## 2024-02-24 ENCOUNTER — Other Ambulatory Visit: Payer: Self-pay

## 2024-02-24 ENCOUNTER — Ambulatory Visit: Payer: Self-pay | Attending: Family Medicine | Admitting: Family Medicine

## 2024-02-24 ENCOUNTER — Encounter: Payer: Self-pay | Admitting: Family Medicine

## 2024-02-24 VITALS — BP 109/63 | HR 71 | Ht 74.0 in | Wt 192.4 lb

## 2024-02-24 DIAGNOSIS — E1165 Type 2 diabetes mellitus with hyperglycemia: Secondary | ICD-10-CM

## 2024-02-24 DIAGNOSIS — Z1211 Encounter for screening for malignant neoplasm of colon: Secondary | ICD-10-CM

## 2024-02-24 DIAGNOSIS — Z794 Long term (current) use of insulin: Secondary | ICD-10-CM

## 2024-02-24 DIAGNOSIS — Z09 Encounter for follow-up examination after completed treatment for conditions other than malignant neoplasm: Secondary | ICD-10-CM

## 2024-02-24 MED ORDER — INSULIN LISPRO (1 UNIT DIAL) 100 UNIT/ML (KWIKPEN)
0.0000 [IU] | PEN_INJECTOR | Freq: Three times a day (TID) | SUBCUTANEOUS | 0 refills | Status: DC
  Filled 2024-02-24 (×2): qty 12, 34d supply, fill #0

## 2024-02-24 MED ORDER — BASAGLAR KWIKPEN 100 UNIT/ML ~~LOC~~ SOPN
28.0000 [IU] | PEN_INJECTOR | Freq: Every day | SUBCUTANEOUS | 1 refills | Status: DC
  Filled 2024-02-24: qty 18, 64d supply, fill #0
  Filled 2024-02-24: qty 9, 32d supply, fill #0
  Filled 2024-03-25: qty 9, 32d supply, fill #1

## 2024-02-24 MED ORDER — ATORVASTATIN CALCIUM 40 MG PO TABS
40.0000 mg | ORAL_TABLET | Freq: Every day | ORAL | 0 refills | Status: DC
Start: 2024-02-24 — End: 2024-05-23
  Filled 2024-02-24: qty 90, 90d supply, fill #0

## 2024-02-24 NOTE — Patient Instructions (Signed)
 VISIT SUMMARY:  You came in today for a follow-up on your blood sugar management. Your blood sugar levels have been generally high, and we discussed adjustments to your insulin  regimen to help improve your control.  YOUR PLAN:  -TYPE 2 DIABETES MELLITUS WITH HYPERGLYCEMIA: Type 2 diabetes is a condition where your body does not use insulin  properly, leading to high blood sugar levels. Your current insulin  regimen is not sufficient to control your blood sugar. We are increasing your Lantus  dose to 28 units daily and implementing a sliding scale for your short-acting insulin  based on your blood glucose levels. Please continue to check your blood sugar three times a day, and use the sliding scale chart provided for insulin  dosing. Your prescriptions have been sent to the pharmacy.   For blood sugars 0-150 give 0 units of insulin , 151-200 give 2 units of insulin , 201-250 give 4 units, 251-300 give 6 units, 301-350 give 8 units, 351-400 give 10 units,> 400 give 12 units and call M.D.   INSTRUCTIONS:  Please follow the new insulin  regimen and check your blood sugar three times daily. Use the sliding scale chart for insulin  dosing as instructed. If you have any questions or concerns, do not hesitate to contact our office.

## 2024-02-24 NOTE — Progress Notes (Signed)
 Subjective:  Patient ID: Stanley Edwards, male    DOB: April 26, 1966  Age: 58 y.o. MRN: [id number]  CC: Hospitalization Follow-up     Discussed the use of AI scribe software for clinical note transcription with the patient, who gave verbal consent to proceed.  History of Present Illness Stanley Edwards is a 58 year old male with diabetes who presents for follow-up of blood sugar management. In 11/2023 he was hospitalized for DKA after medication nonadherence. His blood sugar levels are generally slightly over 200 mg/dL, with occasional readings in the 100s after meals and insulin  administration, but typically higher than 120 mg/dL. He is on an insulin  regimen of 20 units of Lantus  and 10 units of Humalog  before meals, twice a day. He checks his blood sugar three times a day and has previously used a sliding scale for insulin  dosing. He had a visit with a clinical pharmacist named Herlene but does not recall specific instructions from that visit.    Past Medical History:  Diagnosis Date   Allergy    Diabetes mellitus without complication (HCC)    Hypertension    Pt denies htn but it was on his previous hx    No past surgical history on file.  Family History  Problem Relation Age of Onset   Cancer Mother    Diabetes Father     Social History   Socioeconomic History   Marital status: Single    Spouse name: Not on file   Number of children: Not on file   Years of education: Not on file   Highest education level: Not on file  Occupational History   Not on file  Tobacco Use   Smoking status: Former    Types: Cigarettes   Smokeless tobacco: Never  Substance and Sexual Activity   Alcohol  use: Yes    Alcohol /week: 1.0 standard drink of alcohol     Types: 1 Cans of beer per week   Drug use: No   Sexual activity: Yes  Other Topics Concern   Not on file  Social History Narrative   Not on file   Social Drivers of Health   Financial Resource Strain: Low Risk  (05/22/2021)    Received [facility]   Overall Financial Resource Strain (CARDIA)    Difficulty of Paying Living Expenses: Not hard at all  Food Insecurity: No Food Insecurity (12/10/2023)   Hunger Vital Sign    Worried About Running Out of Food in the Last Year: Never true    Ran Out of Food in the Last Year: Never true  Transportation Needs: No Transportation Needs (12/10/2023)   PRAPARE - Administrator, Civil Service (Medical): No    Lack of Transportation (Non-Medical): No  Physical Activity: Not on file  Stress: No Stress Concern Present (05/22/2021)   Received from [facility] - Occupational Stress Questionnaire    Feeling of Stress : Not at all  Social Connections: Unknown (12/10/2021)   Received from [facility]   Social Network    Social Network: Not on file    No Known Allergies  Outpatient Medications Prior to Visit  Medication Sig Dispense Refill   Blood Glucose Monitoring Suppl (TRUE METRIX METER) w/Device KIT Use to check blood sugar 3 times daily. 1 kit 0   Continuous Glucose Receiver (DEXCOM G7 RECEIVER) DEVI Use to check blood glucose continuously. 1 each 0   Continuous Glucose Sensor (DEXCOM G7 SENSOR) MISC  Use to check blood sugar as directed. Change sensor every 10 days 3 each 6   glucose blood (TRUE METRIX BLOOD GLUCOSE TEST) test strip Use to check blood sugar 3 times daily. 100 each 6   Insulin  Pen Needle 32G X 4 MM MISC Use to inject insulin  4 times daily. 100 each 0   TRUEplus Lancets 28G MISC Use to check blood sugar 3 times daily. 100 each 6   atorvastatin  (LIPITOR) 40 MG tablet Take 1 tablet (40 mg total) by mouth daily. 90 tablet 0   Insulin  Glargine (BASAGLAR  KWIKPEN) 100 UNIT/ML Inject 20 Units into the skin daily. 18 mL 1   insulin  lispro (HUMALOG ) 100 UNIT/ML KwikPen Inject 5 Units into the skin 3 (three) times daily before meals. 12 mL 0   No facility-administered medications prior to visit.      ROS Review of Systems  Constitutional:  Negative for activity change and appetite change.  HENT:  Negative for sinus pressure and sore throat.   Respiratory:  Negative for chest tightness, shortness of breath and wheezing.   Cardiovascular:  Negative for chest pain and palpitations.  Gastrointestinal:  Negative for abdominal distention, abdominal pain and constipation.  Genitourinary: Negative.   Musculoskeletal: Negative.   Psychiatric/Behavioral:  Negative for behavioral problems and dysphoric mood.     Objective:  BP 109/63   Pulse 71   Ht 6' 2 (1.88 m)   Wt 192 lb 6.4 oz (87.3 kg)   SpO2 99%   BMI 24.70 kg/m      02/24/2024    9:34 AM 01/25/2024   10:23 AM 12/12/2023    8:25 AM  BP/Weight  Systolic BP 109 122 117  Diastolic BP 63 80 60  Wt. (Lbs) 192.4 183.4   BMI 24.7 kg/m2 23.55 kg/m2       Physical Exam Constitutional:      Appearance: He is well-developed.  Cardiovascular:     Rate and Rhythm: Normal rate.     Heart sounds: Normal heart sounds. No murmur heard. Pulmonary:     Effort: Pulmonary effort is normal.     Breath sounds: Normal breath sounds. No wheezing or rales.  Chest:     Chest wall: No tenderness.  Abdominal:     General: Bowel sounds are normal. There is no distension.     Palpations: Abdomen is soft. There is no mass.     Tenderness: There is no abdominal tenderness.  Musculoskeletal:        General: Normal range of motion.     Right lower leg: No edema.     Left lower leg: No edema.  Neurological:     Mental Status: He is alert and oriented to person, place, and time.  Psychiatric:        Mood and Affect: Mood normal.    Diabetic Foot Exam - Simple   Simple Foot Form Diabetic Foot exam was performed with the following findings: Yes 02/24/2024 10:08 AM  Visual Inspection No deformities, no ulcerations, no other skin breakdown bilaterally: Yes Sensation Testing Intact to touch and monofilament testing bilaterally:  Yes Pulse Check Posterior Tibialis and Dorsalis pulse intact bilaterally: Yes Comments        Latest Ref Rng & Units 01/25/2024   11:27 AM 12/12/2023    4:08 AM 12/11/2023    4:17 AM  CMP  Glucose 70 - 99 mg/dL 834  750  880   BUN 6 - 24 mg/dL 13  27  31  Creatinine 0.76 - 1.27 mg/dL 8.69  8.76  8.53   Sodium 134 - 144 mmol/L 138  138  147   Potassium 3.5 - 5.2 mmol/L 5.0  3.9  3.7   Chloride 96 - 106 mmol/L 102  105  110   CO2 20 - 29 mmol/L 21  28  29    Calcium  8.7 - 10.2 mg/dL 9.5  8.2  8.9     Lipid Panel     Component Value Date/Time   CHOL 186 01/25/2024 1127   TRIG 63 01/25/2024 1127   HDL 44 01/25/2024 1127   CHOLHDL 4.2 01/25/2024 1127   CHOLHDL 5.8 06/26/2022 0128   VLDL 18 06/26/2022 0128   LDLCALC 130 (H) 01/25/2024 1127    CBC    Component Value Date/Time   WBC 3.5 01/25/2024 1127   WBC 5.5 12/12/2023 0408   RBC 3.62 (L) 01/25/2024 1127   RBC 2.94 (L) 12/12/2023 0408   HGB 12.5 (L) 01/25/2024 1127   HCT 38.2 01/25/2024 1127   PLT 335 01/25/2024 1127   MCV 106 (H) 01/25/2024 1127   MCH 34.5 (H) 01/25/2024 1127   MCH 34.0 12/12/2023 0408   MCHC 32.7 01/25/2024 1127   MCHC 34.1 12/12/2023 0408   RDW 11.4 (L) 01/25/2024 1127   LYMPHSABS 1.4 01/25/2024 1127   MONOABS 0.4 06/25/2022 0229   EOSABS 0.2 01/25/2024 1127   BASOSABS 0.1 01/25/2024 1127    Lab Results  Component Value Date   HGBA1C 12.2 (H) 12/10/2023       Assessment & Plan Type 2 diabetes mellitus with hyperglycemia Type 2 diabetes with poor glycemic control, A1c 12.2, blood glucose >200 mg/dL. Current insulin  regimen insufficient. - Increase Lantus  to 28 units subcutaneously daily. - Implement sliding scale for short-acting insulin  based on blood glucose levels. - Instruct to check blood glucose three times daily. - Provide sliding scale chart for insulin  dosing. - Send prescriptions to pharmacy. For blood sugars 0-150 give 0 units of insulin , 151-200 give 2 units of insulin ,  201-250 give 4 units, 251-300 give 6 units, 301-350 give 8 units, 351-400 give 10 units,> 400 give 12 units and call M.D.    Screening for colon cancer FIt test ordered   Meds ordered this encounter  Medications   atorvastatin  (LIPITOR) 40 MG tablet    Sig: Take 1 tablet (40 mg total) by mouth daily.    Dispense:  90 tablet    Refill:  0   Insulin  Glargine (BASAGLAR  KWIKPEN) 100 UNIT/ML    Sig: Inject 28 Units into the skin daily.    Dispense:  18 mL    Refill:  1   insulin  lispro (HUMALOG ) 100 UNIT/ML KwikPen    Sig: Inject 0-12 Units into the skin 3 (three) times daily before meals. Per Sliding scale    Dispense:  12 mL    Refill:  0    For blood sugars 0-150 give 0 units of insulin , 151-200 give 2 units of insulin , 201-250 give 4 units, 251-300 give 6 units, 301-350 give 8 units, 351-400 give 10 units,> 400 give 12 units.    Follow-up: Return in about 3 months (around 05/26/2024) for Chronic medical conditions.       Corrina Sabin, MD, FAAFP. [facility] and [facility] [place], [place] [phone]   02/24/2024, 10:08 AM

## 2024-03-25 ENCOUNTER — Other Ambulatory Visit: Payer: Self-pay

## 2024-03-25 ENCOUNTER — Other Ambulatory Visit: Payer: Self-pay | Admitting: Family Medicine

## 2024-03-25 DIAGNOSIS — Z1211 Encounter for screening for malignant neoplasm of colon: Secondary | ICD-10-CM

## 2024-03-25 DIAGNOSIS — E1165 Type 2 diabetes mellitus with hyperglycemia: Secondary | ICD-10-CM

## 2024-03-25 MED ORDER — INSULIN LISPRO (1 UNIT DIAL) 100 UNIT/ML (KWIKPEN)
0.0000 [IU] | PEN_INJECTOR | Freq: Three times a day (TID) | SUBCUTANEOUS | 0 refills | Status: DC
  Filled 2024-03-25: qty 12, 34d supply, fill #0

## 2024-03-29 ENCOUNTER — Telehealth: Payer: Self-pay | Admitting: Family Medicine

## 2024-03-29 ENCOUNTER — Other Ambulatory Visit: Payer: Self-pay

## 2024-03-29 DIAGNOSIS — E1165 Type 2 diabetes mellitus with hyperglycemia: Secondary | ICD-10-CM

## 2024-03-29 MED ORDER — INSULIN PEN NEEDLE 32G X 4 MM MISC
0 refills | Status: DC
  Filled 2024-03-29 – 2024-05-23 (×2): qty 100, 25d supply, fill #0

## 2024-03-29 NOTE — Telephone Encounter (Signed)
 Pen needles has been sent.

## 2024-03-29 NOTE — Telephone Encounter (Signed)
 Copied from CRM [id number]. Topic: Clinical - Prescription Issue >> Mar 25, 2024  5:13 PM Debby BROCKS wrote: Reason for CRM: Patient states that he was able to pick up his insulin  medication from the pharmacy but they never sent the Insulin  Pen Needle 32G X 4 MM MISC so the patient cannot take the insulin 

## 2024-04-05 ENCOUNTER — Other Ambulatory Visit: Payer: Self-pay

## 2024-04-06 ENCOUNTER — Other Ambulatory Visit: Payer: Self-pay

## 2024-04-07 ENCOUNTER — Other Ambulatory Visit: Payer: Self-pay

## 2024-05-23 ENCOUNTER — Ambulatory Visit: Payer: Self-pay | Attending: Family Medicine | Admitting: Family Medicine

## 2024-05-23 ENCOUNTER — Other Ambulatory Visit: Payer: Self-pay

## 2024-05-23 ENCOUNTER — Encounter: Payer: Self-pay | Admitting: Family Medicine

## 2024-05-23 ENCOUNTER — Other Ambulatory Visit: Payer: Self-pay | Admitting: Family Medicine

## 2024-05-23 VITALS — BP 98/64 | HR 89 | Temp 97.9°F | Ht 74.0 in | Wt 194.0 lb

## 2024-05-23 DIAGNOSIS — Z794 Long term (current) use of insulin: Secondary | ICD-10-CM

## 2024-05-23 DIAGNOSIS — E1165 Type 2 diabetes mellitus with hyperglycemia: Secondary | ICD-10-CM

## 2024-05-23 DIAGNOSIS — Z1211 Encounter for screening for malignant neoplasm of colon: Secondary | ICD-10-CM

## 2024-05-23 DIAGNOSIS — E785 Hyperlipidemia, unspecified: Secondary | ICD-10-CM

## 2024-05-23 DIAGNOSIS — E1169 Type 2 diabetes mellitus with other specified complication: Secondary | ICD-10-CM

## 2024-05-23 LAB — POCT GLYCOSYLATED HEMOGLOBIN (HGB A1C): HbA1c, POC (controlled diabetic range): 10.6 % — AB (ref 0.0–7.0)

## 2024-05-23 MED ORDER — INSULIN LISPRO (1 UNIT DIAL) 100 UNIT/ML (KWIKPEN)
0.0000 [IU] | PEN_INJECTOR | Freq: Three times a day (TID) | SUBCUTANEOUS | 0 refills | Status: DC
  Filled 2024-05-23: qty 9, 25d supply, fill #0

## 2024-05-23 MED ORDER — ATORVASTATIN CALCIUM 40 MG PO TABS
40.0000 mg | ORAL_TABLET | Freq: Every day | ORAL | 1 refills | Status: DC
  Filled 2024-05-23: qty 90, 90d supply, fill #0

## 2024-05-23 MED ORDER — BASAGLAR KWIKPEN 100 UNIT/ML ~~LOC~~ SOPN
25.0000 [IU] | PEN_INJECTOR | Freq: Every day | SUBCUTANEOUS | 6 refills | Status: DC
  Filled 2024-05-23: qty 15, 30d supply, fill #0
  Filled 2024-05-23: qty 30, 120d supply, fill #0

## 2024-05-23 NOTE — Progress Notes (Signed)
 Subjective:  Patient ID: Stanley Edwards, male    DOB: [birth year]  Age: 58 y.o. MRN: [id number]  CC: Medical Management of Chronic Issues     Discussed the use of AI scribe software for clinical note transcription with the patient, who gave verbal consent to proceed.  History of Present Illness Stanley Edwards is a 58 year old male with type II diabetes, hyperlipidemia who presents for diabetes management.  He monitors his blood sugar levels at home, with readings typically around 220 mg/dL before breakfast and the lowest being 120 mg/dL. His recent A1c is 10.6%, down from 12.2%, but still above the target of less than 7%.  He takes Basaglar  (Lantus ) and Humalog  for diabetes. He adjusts his Basaglar  dose based on blood sugar readings, with a maximum of 18 units taken at one time, although his prescribed dose is 28 units. He also takes Humalog  three times a day with meals, adjusting the dose based on sugar levels.    Past Medical History:  Diagnosis Date   Allergy    Diabetes mellitus without complication (HCC)    Hypertension    Pt denies htn but it was on his previous hx    No past surgical history on file.  Family History  Problem Relation Age of Onset   Cancer Mother    Diabetes Father     Social History   Socioeconomic History   Marital status: Single    Spouse name: Not on file   Number of children: Not on file   Years of education: Not on file   Highest education level: Not on file  Occupational History   Not on file  Tobacco Use   Smoking status: Former    Types: Cigarettes   Smokeless tobacco: Never  Substance and Sexual Activity   Alcohol  use: Yes    Alcohol /week: 1.0 standard drink of alcohol     Types: 1 Cans of beer per week   Drug use: No   Sexual activity: Yes  Other Topics Concern   Not on file  Social History Narrative   Not on file   Social Drivers of Health   Financial Resource Strain: Low Risk  (05/22/2021)   Received from [facility]   Overall Financial Resource Strain (CARDIA)    Difficulty of Paying Living Expenses: Not hard at all  Food Insecurity: No Food Insecurity (12/10/2023)   Hunger Vital Sign    Worried About Running Out of Food in the Last Year: Never true    Ran Out of Food in the Last Year: Never true  Transportation Needs: No Transportation Needs (12/10/2023)   PRAPARE - Administrator, Civil Service (Medical): No    Lack of Transportation (Non-Medical): No  Physical Activity: Not on file  Stress: No Stress Concern Present (05/22/2021)   Received from [facility]ational [facility] - Occupational Stress Questionnaire    Feeling of Stress : Not at all  Social Connections: Unknown (12/10/2021)   Received from [facility]   Social Network    Social Network: Not on file    No Known Allergies  Outpatient Medications Prior to Visit  Medication Sig Dispense Refill   Blood Glucose Monitoring Suppl (TRUE METRIX METER) w/Device KIT Use to check blood sugar 3 times daily. 1 kit 0   Continuous Glucose Receiver (DEXCOM G7 RECEIVER) DEVI Use to check blood glucose continuously. 1 each 0   Continuous Glucose Sensor (DEXCOM G7 SENSOR) MISC  Use to check blood sugar as directed. Change sensor every 10 days 3 each 6   glucose blood (TRUE METRIX BLOOD GLUCOSE TEST) test strip Use to check blood sugar 3 times daily. 100 each 6   Insulin  Pen Needle 32G X 4 MM MISC Use to inject insulin  4 times daily. 100 each 0   TRUEplus Lancets 28G MISC Use to check blood sugar 3 times daily. 100 each 6   Insulin  Glargine (BASAGLAR  KWIKPEN) 100 UNIT/ML Inject 28 Units into the skin daily. 18 mL 1   insulin  lispro (HUMALOG  KWIKPEN) 100 UNIT/ML KwikPen Inject 0-12 Units into the skin 3 (three) times daily before meals. Per Sliding scale.For blood sugars 0-150 give 0 units of insulin , 151-200 give 2 units of insulin , 201-250 give 4 units, 251-300 give 6 units, 301-350 give 8 units, 351-400 give  10 units,> 400 give 12 units. 12 mL 0   atorvastatin  (LIPITOR) 40 MG tablet Take 1 tablet (40 mg total) by mouth daily. (Patient not taking: Reported on 05/23/2024) 90 tablet 0   No facility-administered medications prior to visit.     ROS Review of Systems  Constitutional:  Negative for activity change and appetite change.  HENT:  Negative for sinus pressure and sore throat.   Respiratory:  Negative for chest tightness, shortness of breath and wheezing.   Cardiovascular:  Negative for chest pain and palpitations.  Gastrointestinal:  Negative for abdominal distention, abdominal pain and constipation.  Genitourinary: Negative.   Musculoskeletal: Negative.   Psychiatric/Behavioral:  Negative for behavioral problems and dysphoric mood.     Objective:  BP 98/64   Pulse 89   Temp 97.9 F (36.6 C) (Oral)   Ht 6' 2 (1.88 m)   Wt 194 lb (88 kg)   SpO2 97%   BMI 24.91 kg/m      05/23/2024    8:51 AM 02/24/2024    9:34 AM 01/25/2024   10:23 AM  BP/Weight  Systolic BP 98 109 122  Diastolic BP 64 63 80  Wt. (Lbs) 194 192.4 183.4  BMI 24.91 kg/m2 24.7 kg/m2 23.55 kg/m2      Physical Exam Constitutional:      Appearance: He is well-developed.  Cardiovascular:     Rate and Rhythm: Normal rate.     Heart sounds: Normal heart sounds. No murmur heard. Pulmonary:     Effort: Pulmonary effort is normal.     Breath sounds: Normal breath sounds. No wheezing or rales.  Chest:     Chest wall: No tenderness.  Abdominal:     General: Bowel sounds are normal. There is no distension.     Palpations: Abdomen is soft. There is no mass.     Tenderness: There is no abdominal tenderness.  Musculoskeletal:        General: Normal range of motion.     Right lower leg: No edema.     Left lower leg: No edema.  Neurological:     Mental Status: He is alert and oriented to person, place, and time.  Psychiatric:        Mood and Affect: Mood normal.        Latest Ref Rng & Units 01/25/2024    11:27 AM 12/12/2023    4:08 AM 12/11/2023    4:17 AM  CMP  Glucose 70 - 99 mg/dL 834  750  880   BUN 6 - 24 mg/dL 13  27  31    Creatinine 0.76 - 1.27 mg/dL 8.69  8.76  1.46   Sodium 134 - 144 mmol/L 138  138  147   Potassium 3.5 - 5.2 mmol/L 5.0  3.9  3.7   Chloride 96 - 106 mmol/L 102  105  110   CO2 20 - 29 mmol/L 21  28  29    Calcium  8.7 - 10.2 mg/dL 9.5  8.2  8.9     Lipid Panel     Component Value Date/Time   CHOL 186 01/25/2024 1127   TRIG 63 01/25/2024 1127   HDL 44 01/25/2024 1127   CHOLHDL 4.2 01/25/2024 1127   CHOLHDL 5.8 06/26/2022 0128   VLDL 18 06/26/2022 0128   LDLCALC 130 (H) 01/25/2024 1127    CBC    Component Value Date/Time   WBC 3.5 01/25/2024 1127   WBC 5.5 12/12/2023 0408   RBC 3.62 (L) 01/25/2024 1127   RBC 2.94 (L) 12/12/2023 0408   HGB 12.5 (L) 01/25/2024 1127   HCT 38.2 01/25/2024 1127   PLT 335 01/25/2024 1127   MCV 106 (H) 01/25/2024 1127   MCH 34.5 (H) 01/25/2024 1127   MCH 34.0 12/12/2023 0408   MCHC 32.7 01/25/2024 1127   MCHC 34.1 12/12/2023 0408   RDW 11.4 (L) 01/25/2024 1127   LYMPHSABS 1.4 01/25/2024 1127   MONOABS 0.4 06/25/2022 0229   EOSABS 0.2 01/25/2024 1127   BASOSABS 0.1 01/25/2024 1127    Lab Results  Component Value Date   HGBA1C 10.6 (A) 05/23/2024    Lab Results  Component Value Date   HGBA1C 10.6 (A) 05/23/2024   HGBA1C 12.2 (H) 12/10/2023   HGBA1C 8.4 (A) 01/26/2023       Assessment & Plan Type 2 diabetes mellitus with hyperglycemia Blood glucose levels remain elevated with A1c at 10.6%. Insulin  administration inconsistent. - Adjust Basaglar  to 25 units daily, fixed dose. - Continue Humalog  0-12 units TID before meals, adjust per blood sugar. - Send insulin  refill to [facility] - Follow up with clinical pharmacist in one month for up titration of insulin , bring glucose meter. -Counseled on Diabetic diet, the healthy plate, 849 minutes of moderate intensity exercise/week Blood sugar logs  with fasting goals of 80-120 mg/dl, random of less than 819 and in the event of sugars less than 60 mg/dl or greater than 599 mg/dl encouraged to notify the clinic. Advised on the need for annual eye exams, annual foot exams, Pneumonia vaccine.   Hyperlipidemia associated with type 2 diabetes mellitus Atorvastatin  not taken due to running out. - Refill atorvastatin  prescription. - Will not make changes to atorvastatin  if lipid panel is above goal - Low-cholesterol diet  General Health Maintenance Colorectal cancer screening due, previous kit mishandled. - Provide new Cologuard stool test kit.     Healthcare maintenance Screening for colon cancer-prefers Cologuard to colonoscopy.  Order has been placed.  Meds ordered this encounter  Medications   Insulin  Glargine (BASAGLAR  KWIKPEN) 100 UNIT/ML    Sig: Inject 25 Units into the skin daily.    Dispense:  30 mL    Refill:  6   insulin  lispro (HUMALOG  KWIKPEN) 100 UNIT/ML KwikPen    Sig: Inject 0-12 Units into the skin 3 (three) times daily before meals. Per Sliding scale.For blood sugars 0-150 give 0 units of insulin , 151-200 give 2 units of insulin , 201-250 give 4 units, 251-300 give 6 units, 301-350 give 8 units, 351-400 give 10 units,> 400 give 12 units.    Dispense:  12 mL    Refill:  0  For blood sugars 0-150 give 0 units of insulin , 151-200 give 2 units of insulin , 201-250 give 4 units, 251-300 give 6 units, 301-350 give 8 units, 351-400 give 10 units,> 400 give 12 units.   atorvastatin  (LIPITOR) 40 MG tablet    Sig: Take 1 tablet (40 mg total) by mouth daily.    Dispense:  90 tablet    Refill:  1    Follow-up: Return in about 1 month (around 06/23/2024) for Blood sugar evaluation with Herlene, in 3 months, Medical conditions with PCP.       Corrina Sabin, MD, FAAFP. [facility] [place], [place] [phone]   05/23/2024, 10:36 AM

## 2024-05-23 NOTE — Patient Instructions (Signed)
 VISIT SUMMARY:  You came in today for diabetes management. Your blood sugar levels are still higher than we would like, with an A1c of 10.6%. We discussed your current medications and made some adjustments to help better control your diabetes. We also addressed your cholesterol management and general health maintenance.  YOUR PLAN:  -TYPE 2 DIABETES MELLITUS WITH HYPERGLYCEMIA: Type 2 diabetes is a condition where your body does not use insulin  properly, leading to high blood sugar levels. Your blood sugar levels are still elevated, so we are adjusting your Basaglar  dose to 25 units daily as a fixed dose. Continue taking Humalog  before meals, adjusting the dose based on your blood sugar levels. We will send a refill for your insulin  to [facility]. Please follow up with the clinical pharmacist in one month and bring your glucose meter to the appointment.  -HYPERLIPIDEMIA: Hyperlipidemia means you have high levels of fats (lipids) in your blood, which can increase your risk of heart disease. You mentioned that you ran out of atorvastatin , so we will refill your prescription to help manage your cholesterol levels.  -GENERAL HEALTH MAINTENANCE: For your general health, you are due for colorectal cancer screening. We will provide you with a new Cologuard stool test kit since the previous one was mishandled.  INSTRUCTIONS:  Please follow up with the clinical pharmacist in one month and bring your glucose meter to the appointment.

## 2024-05-24 ENCOUNTER — Ambulatory Visit: Payer: Self-pay | Admitting: Family Medicine

## 2024-05-24 ENCOUNTER — Other Ambulatory Visit: Payer: Self-pay

## 2024-05-24 LAB — CMP14+EGFR
ALT: 18 IU/L (ref 0–44)
AST: 14 IU/L (ref 0–40)
Albumin: 4 g/dL (ref 3.8–4.9)
Alkaline Phosphatase: 123 IU/L (ref 47–123)
BUN/Creatinine Ratio: 14 (ref 9–20)
BUN: 21 mg/dL (ref 6–24)
Bilirubin Total: 0.3 mg/dL (ref 0.0–1.2)
CO2: 23 mmol/L (ref 20–29)
Calcium: 9.5 mg/dL (ref 8.7–10.2)
Chloride: 97 mmol/L (ref 96–106)
Creatinine, Ser: 1.5 mg/dL — ABNORMAL HIGH (ref 0.76–1.27)
Globulin, Total: 3 g/dL (ref 1.5–4.5)
Glucose: 369 mg/dL — ABNORMAL HIGH (ref 70–99)
Potassium: 4.5 mmol/L (ref 3.5–5.2)
Sodium: 134 mmol/L (ref 134–144)
Total Protein: 7 g/dL (ref 6.0–8.5)
eGFR: 53 mL/min/1.73 — ABNORMAL LOW (ref >59)

## 2024-05-24 MED ORDER — TECHLITE PLUS PEN NEEDLES 32G X 4 MM MISC
1 refills | Status: AC
  Filled 2024-05-24: qty 400, 25d supply, fill #0

## 2024-05-24 NOTE — Telephone Encounter (Signed)
 Requested medication (s) are due for refill today: na  Requested medication (s) are on the active medication list: yes  Last refill:  03/29/24 #100 0 refills  Future visit scheduled: yes 08/23/24  Notes to clinic:  last ordered by Brayton CANDIE Lye, MD on 12/12/23. Do you want to order/ refill Rx?     Requested Prescriptions  Pending Prescriptions Disp Refills   Insulin  Pen Needle (TECHLITE PLUS PEN NEEDLES) 32G X 4 MM MISC 100 each 0    Sig: Use to inject insulin  4 times daily.     Endocrinology: Diabetes - Testing Supplies Passed - 05/24/2024  3:27 PM      Passed - Valid encounter within last 12 months    Recent Outpatient Visits           Yesterday Uncontrolled type 2 diabetes mellitus with hyperglycemia, with long-term current use of insulin  [facility])   [facility] [place]. [facility] Delbert Clam, MD   3 months ago Screening for colon cancer   [facility] [place]. [facility] Delbert Clam, MD   4 months ago Encounter for medication review and counseling   [facility] [place]. [facility] Fleeta Tonia Garnette LITTIE, RPH-CPP   4 months ago Hospital discharge follow-up   [facility] Celestia Rosaline SQUIBB, NP   1 year ago Type 2 diabetes mellitus with hypoglycemia without coma, with long-term current use of insulin  [facility])    [facility] [place]. [facility] Vicci Barnie NOVAK, MD

## 2024-05-27 ENCOUNTER — Telehealth: Payer: Self-pay

## 2024-05-27 NOTE — Telephone Encounter (Signed)
 Spoke patient advised per PCP Kidney function is slightly abnormal; avoid medications like Ibuprofen, Aleve which can worsen kidney function. I will repeat labs at next visit. Glucose is elevated and I had adjusted his regimen at his last visit which he needs to continue along with a diabetic diet

## 2024-05-27 NOTE — Telephone Encounter (Signed)
 Copied from CRM [id number]. Topic: Clinical - Lab/Test Results >> May 27, 2024  2:00 PM Dedra B wrote: Reason for CRM: Pt returning call for Alycia regarding lab results.

## 2024-06-27 ENCOUNTER — Encounter: Payer: Self-pay | Admitting: Pharmacist

## 2024-06-27 ENCOUNTER — Ambulatory Visit: Attending: Family Medicine | Admitting: Pharmacist

## 2024-06-27 DIAGNOSIS — E1165 Type 2 diabetes mellitus with hyperglycemia: Secondary | ICD-10-CM | POA: Diagnosis not present

## 2024-06-27 DIAGNOSIS — Z794 Long term (current) use of insulin: Secondary | ICD-10-CM | POA: Diagnosis not present

## 2024-06-27 NOTE — Progress Notes (Signed)
 S:     No chief complaint on file.  58 y.o. male who presents for diabetes evaluation, education, and management. Patient arrives in good spirits and presents without any assistance.   Patient was referred and last seen by Primary Care Provider, Dr. Delbert, on 05/23/24. At that visit, A1c was 10.6% (down from 12.2% prior). It was found at that time that he was varying his basal insulin  dose. Dr. Newlin instructed him to continue 25 units daily as a fixed dose. Humalog  was encouraged on a sliding scale basis.   PMH is significant for T2DM w/ a hx of DKA and long-term insulin  use, HTN, hyperlipidemia.   Today, pt reports to clinic doing well. He endorses adherence to his Basaglar . Takes 24 units at 6:30 PM daily. 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 His largest meal is at ~9 PM. He takes his Humalog  per sliding scale but takes ~45 minutes AFTER EATING.   Family/Social History:  -Fhx: DM -Tobacco: former smoker  -Alcohol : none reported   Current diabetes medications include: Basaglar  25 units daily (injects 24 units at 6:30 pm upon awakening), Humalog  0-12 units prior to eating per sliding scale (eats first, waits 45 minutes to check sugar then administers usually 10-12 minutes) Current hypertension medications include: none Current hyperlipidemia medications include: atorvastatin  40 mg daily   Patient reports adherence to taking all medications as prescribed.   Insurance coverage: [employer]   Patient denies hypoglycemic events. Will sometimes have low sugars if he doesn't eat.  Patient reports polyuria. Patient denies neuropathy (nerve pain). Patient denies visual changes. Patient reports self foot exams.   Patient reported dietary habits: Eats 1-2 meals/day Largest meal is at work around 9 PM.   Patient-reported exercise habits: very active at work   O:  No GM or CGM with him today.  Uses Dexcom G7  Reports seeing most readings in the 100s. Lowest number is in the  morning.  Largest meal: 7p-7a, will eat a meat and 2 vegetables   Lab Results  Component Value Date   HGBA1C 10.6 (A) 05/23/2024   There were no vitals filed for this visit.  Lipid Panel     Component Value Date/Time   CHOL 186 01/25/2024 1127   TRIG 63 01/25/2024 1127   HDL 44 01/25/2024 1127   CHOLHDL 4.2 01/25/2024 1127   CHOLHDL 5.8 06/26/2022 0128   VLDL 18 06/26/2022 0128   LDLCALC 130 (H) 01/25/2024 1127    Clinical Atherosclerotic Cardiovascular Disease (ASCVD): No  The 10-year ASCVD risk score (Arnett DK, et al., 2019) is: 9.4%   Values used to calculate the score:     Age: 83 years     Clincally relevant sex: Male     Is Non-Hispanic African American: Yes     Diabetic: Yes     Tobacco smoker: No     Systolic Blood Pressure: 98 mmHg     Is BP treated: No     HDL Cholesterol: 44 mg/dL     Total Cholesterol: 186 mg/dL   Patient is participating in a Managed Medicaid Plan: no   A/P: Diabetes longstanding currently above goal but improving. Patient is not symptomatic from a hyper- or hypoglycemia-associated standpoint. However, he is able to verbalize appropriate hypoglycemia management plan. Medication adherence appears to be okay. I spent a good bit of time emphasizing sliding scal bolus insulin  TIMING. This is meant to be taken ~15 minutes prior to eating, and I believe he has  longstanding hyperglycemia due poor injection technique. This is evidenced further by him reporting that he has to take 10-12 units of sliding scale at a time. He is amenable to changing his timing. He will take SSI ~15 minutes prior to eating and will continue current dose of Basaglar . I will see him again in 1 month to reassess.  -Continued Basaglar  24 units once daily.  -Continued Humalog  SSI for now. Encouraged patient to take 15 minutes prior to a meal. -Patient educated on purpose, proper use, and potential adverse effects of Basaglar , Humalog .  -Extensively discussed pathophysiology of  diabetes, recommended lifestyle interventions, dietary effects on blood sugar control.  -Counseled on s/sx of and management of hypoglycemia.  -Next A1c anticipated 07/2024.   Written patient instructions provided. Patient verbalized understanding of treatment plan.  Total time in face to face counseling 30 minutes.    Follow-up:  Pharmacist in 1 month  Herlene Fleeta Morris, PharmD, [place], CPP Clinical Pharmacist [facility] [phone]

## 2024-07-23 ENCOUNTER — Observation Stay (HOSPITAL_COMMUNITY)
Admission: EM | Admit: 2024-07-23 | Discharge: 2024-07-25 | Disposition: A | Attending: Family Medicine | Admitting: Family Medicine

## 2024-07-23 ENCOUNTER — Encounter (HOSPITAL_COMMUNITY): Payer: Self-pay

## 2024-07-23 ENCOUNTER — Other Ambulatory Visit: Payer: Self-pay

## 2024-07-23 DIAGNOSIS — R54 Age-related physical debility: Secondary | ICD-10-CM | POA: Diagnosis present

## 2024-07-23 DIAGNOSIS — E0810 Diabetes mellitus due to underlying condition with ketoacidosis without coma: Secondary | ICD-10-CM

## 2024-07-23 DIAGNOSIS — E1110 Type 2 diabetes mellitus with ketoacidosis without coma: Secondary | ICD-10-CM | POA: Diagnosis present

## 2024-07-23 DIAGNOSIS — Z833 Family history of diabetes mellitus: Secondary | ICD-10-CM

## 2024-07-23 DIAGNOSIS — E875 Hyperkalemia: Secondary | ICD-10-CM | POA: Diagnosis present

## 2024-07-23 DIAGNOSIS — I10 Essential (primary) hypertension: Secondary | ICD-10-CM | POA: Diagnosis present

## 2024-07-23 DIAGNOSIS — R7989 Other specified abnormal findings of blood chemistry: Secondary | ICD-10-CM | POA: Insufficient documentation

## 2024-07-23 DIAGNOSIS — E860 Dehydration: Secondary | ICD-10-CM | POA: Diagnosis present

## 2024-07-23 DIAGNOSIS — E1010 Type 1 diabetes mellitus with ketoacidosis without coma: Principal | ICD-10-CM | POA: Diagnosis present

## 2024-07-23 DIAGNOSIS — Z91138 Patient's unintentional underdosing of medication regimen for other reason: Secondary | ICD-10-CM

## 2024-07-23 DIAGNOSIS — Z87891 Personal history of nicotine dependence: Secondary | ICD-10-CM

## 2024-07-23 DIAGNOSIS — Z79899 Other long term (current) drug therapy: Secondary | ICD-10-CM

## 2024-07-23 DIAGNOSIS — E1069 Type 1 diabetes mellitus with other specified complication: Secondary | ICD-10-CM | POA: Diagnosis present

## 2024-07-23 DIAGNOSIS — Z794 Long term (current) use of insulin: Secondary | ICD-10-CM

## 2024-07-23 DIAGNOSIS — E7849 Other hyperlipidemia: Secondary | ICD-10-CM | POA: Diagnosis present

## 2024-07-23 DIAGNOSIS — E1169 Type 2 diabetes mellitus with other specified complication: Secondary | ICD-10-CM | POA: Diagnosis present

## 2024-07-23 DIAGNOSIS — N179 Acute kidney failure, unspecified: Secondary | ICD-10-CM | POA: Diagnosis present

## 2024-07-23 DIAGNOSIS — T383X6A Underdosing of insulin and oral hypoglycemic [antidiabetic] drugs, initial encounter: Secondary | ICD-10-CM | POA: Diagnosis present

## 2024-07-23 DIAGNOSIS — E1165 Type 2 diabetes mellitus with hyperglycemia: Secondary | ICD-10-CM

## 2024-07-23 DIAGNOSIS — E870 Hyperosmolality and hypernatremia: Secondary | ICD-10-CM | POA: Diagnosis present

## 2024-07-23 DIAGNOSIS — Z1211 Encounter for screening for malignant neoplasm of colon: Secondary | ICD-10-CM

## 2024-07-23 LAB — URINALYSIS, ROUTINE W REFLEX MICROSCOPIC
Bacteria, UA: NONE SEEN
Bilirubin Urine: NEGATIVE
Glucose, UA: >=500 mg/dL — AB
Hgb urine dipstick: NEGATIVE
Ketones, ur: 80 mg/dL — AB
Leukocytes,Ua: NEGATIVE
Nitrite: NEGATIVE
Protein, ur: NEGATIVE mg/dL
Specific Gravity, Urine: 1.023 (ref 1.005–1.030)
pH: 5 (ref 5.0–8.0)

## 2024-07-23 LAB — BASIC METABOLIC PANEL WITH GFR
Anion gap: 35 — ABNORMAL HIGH (ref 5–15)
Anion gap: 30 — ABNORMAL HIGH (ref 5–15)
Anion gap: 23 — ABNORMAL HIGH (ref 5–15)
BUN: 56 mg/dL — ABNORMAL HIGH (ref 6–20)
BUN: 53 mg/dL — ABNORMAL HIGH (ref 6–20)
BUN: 49 mg/dL — ABNORMAL HIGH (ref 6–20)
CO2: 12 mmol/L — ABNORMAL LOW (ref 22–32)
CO2: 15 mmol/L — ABNORMAL LOW (ref 22–32)
CO2: 17 mmol/L — ABNORMAL LOW (ref 22–32)
Calcium: 9.5 mg/dL (ref 8.9–10.3)
Calcium: 9.4 mg/dL (ref 8.9–10.3)
Calcium: 9.6 mg/dL (ref 8.9–10.3)
Chloride: 103 mmol/L (ref 98–111)
Chloride: 106 mmol/L (ref 98–111)
Chloride: 112 mmol/L — ABNORMAL HIGH (ref 98–111)
Creatinine, Ser: 2.6 mg/dL — ABNORMAL HIGH (ref 0.61–1.24)
Creatinine, Ser: 2.48 mg/dL — ABNORMAL HIGH (ref 0.61–1.24)
Creatinine, Ser: 2.15 mg/dL — ABNORMAL HIGH (ref 0.61–1.24)
GFR, Estimated: 28 mL/min — ABNORMAL LOW
GFR, Estimated: 29 mL/min — ABNORMAL LOW
GFR, Estimated: 35 mL/min — ABNORMAL LOW
Glucose, Bld: 569 mg/dL (ref 70–99)
Glucose, Bld: 470 mg/dL — ABNORMAL HIGH (ref 70–99)
Glucose, Bld: 250 mg/dL — ABNORMAL HIGH (ref 70–99)
Potassium: 5.6 mmol/L — ABNORMAL HIGH (ref 3.5–5.1)
Potassium: 4.8 mmol/L (ref 3.5–5.1)
Potassium: 4.5 mmol/L (ref 3.5–5.1)
Sodium: 150 mmol/L — ABNORMAL HIGH (ref 135–145)
Sodium: 151 mmol/L — ABNORMAL HIGH (ref 135–145)
Sodium: 152 mmol/L — ABNORMAL HIGH (ref 135–145)

## 2024-07-23 LAB — I-STAT VENOUS BLOOD GAS, ED
Acid-base deficit: 13 mmol/L — ABNORMAL HIGH (ref 0.0–2.0)
Bicarbonate: 13.5 mmol/L — ABNORMAL LOW (ref 20.0–28.0)
Calcium, Ion: 1.07 mmol/L — ABNORMAL LOW (ref 1.15–1.40)
HCT: 42 % (ref 39.0–52.0)
Hemoglobin: 14.3 g/dL (ref 13.0–17.0)
O2 Saturation: 96 %
Potassium: 5.4 mmol/L — ABNORMAL HIGH (ref 3.5–5.1)
Sodium: 148 mmol/L — ABNORMAL HIGH (ref 135–145)
TCO2: 14 mmol/L — ABNORMAL LOW (ref 22–32)
pCO2, Ven: 31.1 mmHg — ABNORMAL LOW (ref 44–60)
pH, Ven: 7.244 — ABNORMAL LOW (ref 7.25–7.43)
pO2, Ven: 95 mmHg — ABNORMAL HIGH (ref 32–45)

## 2024-07-23 LAB — CBC WITH DIFFERENTIAL/PLATELET
Abs Immature Granulocytes: 0.03 K/uL (ref 0.00–0.07)
Basophils Absolute: 0.1 K/uL (ref 0.0–0.1)
Basophils Relative: 1 %
Eosinophils Absolute: 0 K/uL (ref 0.0–0.5)
Eosinophils Relative: 0 %
HCT: 45 % (ref 39.0–52.0)
Hemoglobin: 14.2 g/dL (ref 13.0–17.0)
Immature Granulocytes: 0 %
Lymphocytes Relative: 9 %
Lymphs Abs: 0.7 K/uL (ref 0.7–4.0)
MCH: 33.6 pg (ref 26.0–34.0)
MCHC: 31.6 g/dL (ref 30.0–36.0)
MCV: 106.6 fL — ABNORMAL HIGH (ref 80.0–100.0)
Monocytes Absolute: 0.3 K/uL (ref 0.1–1.0)
Monocytes Relative: 4 %
Neutro Abs: 6.9 K/uL (ref 1.7–7.7)
Neutrophils Relative %: 86 %
Platelets: 374 K/uL (ref 150–400)
RBC: 4.22 MIL/uL (ref 4.22–5.81)
RDW: 11.6 % (ref 11.5–15.5)
WBC: 8 K/uL (ref 4.0–10.5)
nRBC: 0 % (ref 0.0–0.2)

## 2024-07-23 LAB — CBG MONITORING, ED
Glucose-Capillary: 528 mg/dL (ref 70–99)
Glucose-Capillary: 529 mg/dL (ref 70–99)
Glucose-Capillary: 469 mg/dL — ABNORMAL HIGH (ref 70–99)
Glucose-Capillary: 454 mg/dL — ABNORMAL HIGH (ref 70–99)
Glucose-Capillary: 391 mg/dL — ABNORMAL HIGH (ref 70–99)
Glucose-Capillary: 321 mg/dL — ABNORMAL HIGH (ref 70–99)
Glucose-Capillary: 297 mg/dL — ABNORMAL HIGH (ref 70–99)

## 2024-07-23 LAB — MAGNESIUM: Magnesium: 3.1 mg/dL — ABNORMAL HIGH (ref 1.7–2.4)

## 2024-07-23 LAB — PROTIME-INR
INR: 1 (ref 0.8–1.2)
Prothrombin Time: 13.7 s (ref 11.4–15.2)

## 2024-07-23 LAB — BETA-HYDROXYBUTYRIC ACID
Beta-Hydroxybutyric Acid: >8 mmol/L — ABNORMAL HIGH (ref 0.05–0.27)
Beta-Hydroxybutyric Acid: 4.34 mmol/L — ABNORMAL HIGH (ref 0.05–0.27)

## 2024-07-23 LAB — GLUCOSE, CAPILLARY
Glucose-Capillary: 250 mg/dL — ABNORMAL HIGH (ref 70–99)
Glucose-Capillary: 199 mg/dL — ABNORMAL HIGH (ref 70–99)
Glucose-Capillary: 175 mg/dL — ABNORMAL HIGH (ref 70–99)

## 2024-07-23 LAB — HEMOGLOBIN A1C
Hgb A1c MFr Bld: 12.1 % — ABNORMAL HIGH (ref 4.8–5.6)
Mean Plasma Glucose: 300.57 mg/dL

## 2024-07-23 LAB — PHOSPHORUS: Phosphorus: 5 mg/dL — ABNORMAL HIGH (ref 2.5–4.6)

## 2024-07-23 MED ORDER — ONDANSETRON HCL 4 MG/2ML IJ SOLN: 4.0000 mg | Freq: Four times a day (QID) | INTRAMUSCULAR | Status: DC | PRN

## 2024-07-23 MED ORDER — DEXTROSE IN LACTATED RINGERS 5 % IV SOLN: INTRAVENOUS | Status: DC

## 2024-07-23 MED ORDER — ONDANSETRON HCL 4 MG PO TABS: 4.0000 mg | ORAL_TABLET | Freq: Four times a day (QID) | ORAL | Status: DC | PRN

## 2024-07-23 MED ORDER — SENNOSIDES-DOCUSATE SODIUM 8.6-50 MG PO TABS: 1.0000 | ORAL_TABLET | Freq: Every evening | ORAL | Status: DC | PRN

## 2024-07-23 MED ORDER — BISACODYL 5 MG PO TBEC: 5.0000 mg | DELAYED_RELEASE_TABLET | Freq: Every day | ORAL | Status: DC | PRN

## 2024-07-23 MED ORDER — ZOLPIDEM TARTRATE 5 MG PO TABS: 5.0000 mg | ORAL_TABLET | Freq: Every evening | ORAL | Status: DC | PRN

## 2024-07-23 MED ORDER — HEPARIN SODIUM (PORCINE) 5000 UNIT/ML IJ SOLN
5000.0000 [IU] | Freq: Three times a day (TID) | INTRAMUSCULAR | Status: DC
  Administered 2024-07-23 – 2024-07-25 (×5): 5000 [IU] via SUBCUTANEOUS
  Filled 2024-07-23 (×5): qty 1

## 2024-07-23 MED ORDER — LACTATED RINGERS IV BOLUS
20.0000 mL/kg | Freq: Once | INTRAVENOUS | Status: AC
  Administered 2024-07-23: 1906 mL via INTRAVENOUS

## 2024-07-23 MED ORDER — ACETAMINOPHEN 650 MG RE SUPP: 650.0000 mg | Freq: Four times a day (QID) | RECTAL | Status: DC | PRN

## 2024-07-23 MED ORDER — INSULIN GLARGINE 100 UNIT/ML ~~LOC~~ SOLN: 25.0000 [IU] | Freq: Every day | SUBCUTANEOUS | Status: DC

## 2024-07-23 MED ORDER — LACTATED RINGERS IV SOLN: INTRAVENOUS | Status: DC

## 2024-07-23 MED ORDER — SODIUM CHLORIDE 0.9% FLUSH
3.0000 mL | Freq: Two times a day (BID) | INTRAVENOUS | Status: DC
  Administered 2024-07-24: 3 mL via INTRAVENOUS

## 2024-07-23 MED ORDER — DEXTROSE 50 % IV SOLN: 0.0000 mL | INTRAVENOUS | Status: DC | PRN

## 2024-07-23 MED ORDER — OXYCODONE HCL 5 MG PO TABS: 5.0000 mg | ORAL_TABLET | ORAL | Status: DC | PRN

## 2024-07-23 MED ORDER — INSULIN REGULAR(HUMAN) IN NACL 100-0.9 UT/100ML-% IV SOLN
INTRAVENOUS | Status: DC
  Administered 2024-07-23: 8 [IU]/h via INTRAVENOUS

## 2024-07-23 MED ORDER — SODIUM CHLORIDE 0.9% FLUSH
3.0000 mL | Freq: Two times a day (BID) | INTRAVENOUS | Status: DC
  Administered 2024-07-24 – 2024-07-25 (×3): 3 mL via INTRAVENOUS

## 2024-07-23 MED ORDER — ATORVASTATIN CALCIUM 40 MG PO TABS
40.0000 mg | ORAL_TABLET | Freq: Every day | ORAL | Status: DC
  Administered 2024-07-24 – 2024-07-25 (×2): 40 mg via ORAL
  Filled 2024-07-23: qty 1

## 2024-07-23 MED ORDER — FLEET ENEMA RE ENEM: 1.0000 | ENEMA | Freq: Once | RECTAL | Status: DC | PRN

## 2024-07-23 MED ORDER — ACETAMINOPHEN 325 MG PO TABS: 650.0000 mg | ORAL_TABLET | Freq: Four times a day (QID) | ORAL | Status: DC | PRN

## 2024-07-23 MED ORDER — INSULIN REGULAR(HUMAN) IN NACL 100-0.9 UT/100ML-% IV SOLN
INTRAVENOUS | Status: DC
  Administered 2024-07-23: 8 [IU]/h via INTRAVENOUS
  Filled 2024-07-23: qty 100

## 2024-07-23 MED ORDER — IPRATROPIUM BROMIDE 0.02 % IN SOLN: 0.5000 mg | Freq: Four times a day (QID) | RESPIRATORY_TRACT | Status: DC | PRN

## 2024-07-23 MED ORDER — INSULIN GLARGINE 100 UNIT/ML ~~LOC~~ SOLN
25.0000 [IU] | SUBCUTANEOUS | Status: DC
  Filled 2024-07-23: qty 0.25

## 2024-07-23 MED ORDER — LACTATED RINGERS IV BOLUS: 20.0000 mL/kg | Freq: Once | INTRAVENOUS | Status: DC

## 2024-07-23 MED ORDER — HYDROMORPHONE HCL 1 MG/ML IJ SOLN: 0.5000 mg | INTRAMUSCULAR | Status: DC | PRN

## 2024-07-23 MED ORDER — HYDRALAZINE HCL 20 MG/ML IJ SOLN: 10.0000 mg | INTRAMUSCULAR | Status: DC | PRN

## 2024-07-23 NOTE — Assessment & Plan Note (Signed)
 Due to diabetic ketoacidosis -dehydration, ketoacidosis -Continue aggressive IV fluid hydration -Monitor closely

## 2024-07-23 NOTE — H&P (Signed)
 " History and Physical   Patient: Stanley Edwards                            PCP: Delbert Clam, MD                    DOB: [birth year]            DOA: 07/23/2024 FMW:[id number]             DOS: 07/23/2024, 4:24 PM  Delbert Clam, MD  Patient coming from:   HOME  I have personally reviewed patient's medical records, in electronic medical records, including:  [facility], and care everywhere.    Chief Complaint:   Chief Complaint  Patient presents with   Hyperglycemia    History of present illness:    Stanley Edwards is a 58 year old male with a history of uncontrolled DM II, HLD, and medication noncompliance presents to the ED with a chief complaint of not feeling well. He reports several days of poor oral intake associated with nausea and increased thirst. He also endorses vague abdominal discomfort but denies vomiting or focal abdominal pain.  The patient states he has not been taking his insulin  for several days due to poor oral intake and because he ran out of his medications. He denies headache, chest pain, shortness of breath, or other acute complaints. He reports that his current symptoms feel similar to prior episodes of diabetic ketoacidosis.    ED Evaluation:  Blood pressure 125/73, pulse (!) 110, temperature 98.5 F (36.9 C), temperature source Oral, resp. rate 18, height 6' 2 (1.88 m), weight 95.3 kg, SpO2 99%. Labs: 150, 148, K5.6, 5.4, CO2 12, glucose 569, BUN 56, creatinine 2.60, anion gap 35, calcium  9.6, ionized calcium  1.07, GFR 28, beta hydroxy >8.0,  UA straw, glucose greater than 500, ketone 80, negative for leukocytes, nitrites.    Patient was deemed and found in diabetic ketoacidotic state. Started on insulin  drip and IV fluids. Admitted for diabetic ketoacidosis.   Patient Denies having: Fever, Chills, Cough, SOB, Chest Pain, Abd pain, headache, dizziness, lightheadedness,  Dysuria, Joint pain, rash, open wounds    Review of Systems: As per  HPI, otherwise 10 point review of systems were negative.   ----------------------------------------------------------------------------------------------------------------------  Allergies[1]  Home MEDs:  Prior to Admission medications  Medication Sig Start Date End Date Taking? Authorizing Provider  atorvastatin  (LIPITOR) 40 MG tablet Take 1 tablet (40 mg total) by mouth daily. 05/23/24   Newlin, [name], MD  Blood Glucose Monitoring Suppl (TRUE METRIX METER) w/Device KIT Use to check blood sugar 3 times daily. 01/25/24   Newlin, [name], MD  Continuous Glucose Receiver (DEXCOM G7 RECEIVER) DEVI Use to check blood glucose continuously. 01/25/24   Newlin, [name], MD  Continuous Glucose Sensor (DEXCOM G7 SENSOR) MISC Use to check blood sugar as directed. Change sensor every 10 days 12/12/23   Elgergawy, Dawood S, MD  glucose blood (TRUE METRIX BLOOD GLUCOSE TEST) test strip Use to check blood sugar 3 times daily. 01/25/24   Newlin, [name], MD  Insulin  Glargine (BASAGLAR  KWIKPEN) 100 UNIT/ML Inject 25 Units into the skin daily. 05/23/24   Newlin, [name], MD  insulin  lispro (HUMALOG  KWIKPEN) 100 UNIT/ML KwikPen Inject 0-12 Units into the skin 3 (three) times daily before meals. Per Sliding scale.For blood sugars 0-150 give 0 units of insulin , 151-200 give 2 units of insulin , 201-250 give 4 units, 251-300 give 6 units, 301-350  give 8 units, 351-400 give 10 units,> 400 give 12 units. 05/23/24   Newlin, [name], MD  Insulin  Pen Needle (TECHLITE PLUS PEN NEEDLES) 32G X 4 MM MISC Use to inject insulin  4 times daily. 05/24/24   Newlin, [name], MD  TRUEplus Lancets 28G MISC Use to check blood sugar 3 times daily. 01/25/24   Newlin, [name], MD    PRN MEDs: acetaminophen  **OR** acetaminophen , dextrose , hydrALAZINE , HYDROmorphone  (DILAUDID ) injection, ipratropium, ondansetron  **OR** ondansetron  (ZOFRAN ) IV, oxyCODONE , senna-docusate, zolpidem   Past Medical History:  Diagnosis Date   Allergy    Diabetes  mellitus without complication (HCC)    Hypertension    Pt denies htn but it was on his previous hx    No past surgical history on file.   reports that he has quit smoking. His smoking use included cigarettes. He has never used smokeless tobacco. He reports that he does not currently use alcohol  after a past usage of about 1.0 standard drink of alcohol  per week. He reports that he does not use drugs.   Family History  Problem Relation Age of Onset   Cancer Mother    Diabetes Father     Physical Exam:   Vitals:   07/23/24 1327 07/23/24 1332 07/23/24 1346 07/23/24 1349  BP: 125/73   125/73  Pulse: (!) 110   (!) 110  Resp:    18  Temp: 98.5 F (36.9 C)   98.5 F (36.9 C)  TempSrc: Oral   Oral  SpO2: 99%  98% 99%  Weight:  95.3 kg    Height:  6' 2 (1.88 m)     Constitutional: NAD, calm, comfortable Eyes: PERRL, lids and conjunctivae normal ENMT: Mucous membranes are moist. Posterior pharynx clear of any exudate or lesions.Normal dentition.  Neck: normal, supple, no masses, no thyromegaly Respiratory: clear to auscultation bilaterally, no wheezing, no crackles. Normal respiratory effort. No accessory muscle use.  Cardiovascular: Regular rate and rhythm, no murmurs / rubs / gallops. No extremity edema. 2+ pedal pulses. No carotid bruits.  Abdomen: no tenderness, no masses palpated. No hepatosplenomegaly. Bowel sounds positive.  Musculoskeletal: no clubbing / cyanosis. No joint deformity upper and lower extremities. Good ROM, no contractures. Normal muscle tone.  Neurologic: CN II-XII grossly intact. Sensation intact, DTR normal. Strength 5/5 in all 4.  Psychiatric: Normal judgment and insight. Alert and oriented x 3. Normal mood.  Skin: no rashes, lesions, ulcers. No induration    Labs on admission:    I have personally reviewed following labs and imaging studies  CBC: Recent Labs  Lab 07/23/24 1344 07/23/24 1406  WBC 8.0  --   NEUTROABS 6.9  --   HGB 14.2 14.3  HCT  45.0 42.0  MCV 106.6*  --   PLT 374  --    Basic Metabolic Panel: Recent Labs  Lab 07/23/24 1344 07/23/24 1406  NA 150* 148*  K 5.6* 5.4*  CL 103  --   CO2 12*  --   GLUCOSE 569*  --   BUN 56*  --   CREATININE 2.60*  --   CALCIUM  9.5  --     CBG: Recent Labs  Lab 07/23/24 1331 07/23/24 1513 07/23/24 1602  GLUCAP 528* 529* 469*    Urine analysis:    Component Value Date/Time   COLORURINE STRAW (A) 07/23/2024 1335   APPEARANCEUR CLEAR 07/23/2024 1335   LABSPEC 1.023 07/23/2024 1335   PHURINE 5.0 07/23/2024 1335   GLUCOSEU >=500 (A) 07/23/2024 1335   HGBUR NEGATIVE 07/23/2024  1335   BILIRUBINUR NEGATIVE 07/23/2024 1335   BILIRUBINUR negative 07/10/2022 1216   KETONESUR 80 (A) 07/23/2024 1335   PROTEINUR NEGATIVE 07/23/2024 1335   UROBILINOGEN 0.2 07/10/2022 1216   NITRITE NEGATIVE 07/23/2024 1335   LEUKOCYTESUR NEGATIVE 07/23/2024 1335    Last A1C:  Lab Results  Component Value Date   HGBA1C 12.1 (H) 07/23/2024     Radiologic Exams on Admission:   No results found.  EKG:   Independently reviewed.  Orders placed or performed during the hospital encounter of 07/23/24   EKG 12-Lead   ---------------------------------------------------------------------------------------------------------------------------------------    Assessment / Plan:   Active Problems:   Hypertension   DKA (diabetic ketoacidosis) (HCC)   Hypernatremia   AKI (acute kidney injury)   Hyperkalemia   Uncontrolled type 2 diabetes mellitus with hyperglycemia, with long-term current use of insulin  (HCC)   Hyperlipidemia associated with type 2 diabetes mellitus (HCC)   Assessment and Plan: Hypernatremia Due to diabetic ketoacidosis -dehydration, ketoacidosis -Continue aggressive IV fluid hydration -Monitor closely  DKA (diabetic ketoacidosis) (HCC) Diabetic ketoacidosis in setting of uncontrolled diabetes mellitus type II with hemoglobin A1c of 10.06 Glucose 569, BUN 56,  Cr.2.60, GFR 28, positive ketones in urine  Serum anion Gap 35, Beta hydroxy >8.0,   -N.p.o. -IV fluid -As needed antiemetics -Initiating Endo tool, aggressive IV fluid resuscitation, insulin  drip Checking CBG every 1 hour then every 4 hours - Once anion gap is closed we will reinitiate long-acting insulin , SSI coverage -Consulting diabetic coordinator   Hypertension Currently well-controlled, Home medication reviewed no BP meds listed  Uncontrolled type 2 diabetes mellitus with hyperglycemia, with long-term current use of insulin  (HCC) Uncontrolled diabetes mellitus type 2 with hemoglobin  -A1c of 10.6 ---> 12.1 -Currently in diabetic ketoacidosis state -On insulin  drip -Consulting diabetic coordinator Reviewing home insulin  regimen currently listed basal insulin  at 25 units, Patient supposed to be having Dexcom -continuous glucose monitoring -Readjusting home dose insulin  prior to discharge  Hyperkalemia Serum potassium 5.6 >> 5.4 -Likely due to the likely acidosis hyperglycemia -Continue insulin  drip, IV fluids -Monitoring serum potassium level every 4 hours  AKI (acute kidney injury) Due to diabetic ketoacidosis, severe dehydration -Continue IV fluid resuscitation -Monitoring kidney function closely Creatinine 2.60, BUN 56, GFR 28 Lab Results  Component Value Date   CREATININE 2.60 (H) 07/23/2024   CREATININE 1.50 (H) 05/23/2024   CREATININE 1.30 (H) 01/25/2024     Hyperlipidemia associated with type 2 diabetes mellitus (HCC) Continue statin (atorvastatin )    Consults called: Diabetic coordinator -------------------------------------------------------------------------------------------------------------------------------------------- DVT prophylaxis:  heparin  injection 5,000 Units Start: 07/23/24 1545 SCDs Start: 07/23/24 1536   Code Status:   Code Status: Full Code   Admission status: Patient will be admitted as Observation, with a greater than 2  midnight length of stay. Level of care: Progressive   Family Communication:  none at bedside  (The above findings and plan of care has been discussed with patient in detail, the patient expressed understanding and agreement of above plan)  --------------------------------------------------------------------------------------------------------------------------------------------------  Disposition Plan:  Anticipated 1-2 days Status is: Observation The patient remains OBS appropriate and will d/c before 2 midnights.     ----------------------------------------------------------------------------------------------------------------------------------------------------  Time spent:  33  Min.  Was spent seeing and evaluating the patient, reviewing all medical records, drawn plan of care.  SIGNED: Adriana DELENA Grams, MD, FHM. FAAFP. [facility], Pager  (Please use [email or web address] to page/ or secure chat through epic) If 7PM-7AM, please contact night-coverage [email or web address],  07/23/2024, 4:24 PM     [  1] No Known Allergies  "

## 2024-07-23 NOTE — Assessment & Plan Note (Addendum)
 Uncontrolled diabetes mellitus type 2 with hemoglobin  -A1c of 10.6 ---> 12.1 -Currently in diabetic ketoacidosis state -On insulin  drip -Consulting diabetic coordinator Reviewing home insulin  regimen currently listed basal insulin  at 25 units, Patient supposed to be having Dexcom -continuous glucose monitoring -Readjusting home dose insulin  prior to discharge

## 2024-07-23 NOTE — Assessment & Plan Note (Signed)
 Serum potassium 5.6 >> 5.4 -Likely due to the likely acidosis hyperglycemia -Continue insulin  drip, IV fluids -Monitoring serum potassium level every 4 hours

## 2024-07-23 NOTE — ED Notes (Signed)
 Pt

## 2024-07-23 NOTE — ED Provider Notes (Signed)
 " [place] [facility] AT [facility] Provider Note   CSN: [id number] Arrival date & time: 07/23/24  1307     Patient presents with: Hyperglycemia   Stanley Edwards is a 58 y.o. male.    Hyperglycemia    Patient has a history of diabetes hypertension.  Patient states over the last several days Stanley Edwards has had trouble with increased thirst, nausea.  Patient has had decreased appetite.  Patient's sugars were elevated.  Stanley Edwards stopped taking his insulin  because of his decreased appetite.  Patient feels like Stanley Edwards is in DKA.  Prior to Admission medications  Medication Sig Start Date End Date Taking? Authorizing Provider  atorvastatin  (LIPITOR) 40 MG tablet Take 1 tablet (40 mg total) by mouth daily. 05/23/24   Newlin, [name], MD  Blood Glucose Monitoring Suppl (TRUE METRIX METER) w/Device KIT Use to check blood sugar 3 times daily. 01/25/24   Newlin, [name], MD  Continuous Glucose Receiver (DEXCOM G7 RECEIVER) DEVI Use to check blood glucose continuously. 01/25/24   Newlin, [name], MD  Continuous Glucose Sensor (DEXCOM G7 SENSOR) MISC Use to check blood sugar as directed. Change sensor every 10 days 12/12/23   Elgergawy, Dawood S, MD  glucose blood (TRUE METRIX BLOOD GLUCOSE TEST) test strip Use to check blood sugar 3 times daily. 01/25/24   Newlin, [name], MD  Insulin  Glargine (BASAGLAR  KWIKPEN) 100 UNIT/ML Inject 25 Units into the skin daily. 05/23/24   Newlin, [name], MD  insulin  lispro (HUMALOG  KWIKPEN) 100 UNIT/ML KwikPen Inject 0-12 Units into the skin 3 (three) times daily before meals. Per Sliding scale.For blood sugars 0-150 give 0 units of insulin , 151-200 give 2 units of insulin , 201-250 give 4 units, 251-300 give 6 units, 301-350 give 8 units, 351-400 give 10 units,> 400 give 12 units. 05/23/24   Newlin, [name], MD  Insulin  Pen Needle (TECHLITE PLUS PEN NEEDLES) 32G X 4 MM MISC Use to inject insulin  4 times daily. 05/24/24   Newlin, [name], MD  TRUEplus Lancets 28G  MISC Use to check blood sugar 3 times daily. 01/25/24   Newlin, [name], MD    Allergies: Patient has no known allergies.    Review of Systems  Updated Vital Signs BP 125/73   Pulse (!) 110   Temp 98.5 F (36.9 C) (Oral)   Resp 18   Ht 1.88 m (6' 2)   Wt 95.3 kg   SpO2 99%   BMI 26.96 kg/m   Physical Exam Vitals and nursing note reviewed.  Constitutional:      Appearance: Stanley Edwards is well-developed. Stanley Edwards is not diaphoretic.  HENT:     Head: Normocephalic and atraumatic.     Comments: Mm dry    Right Ear: External ear normal.     Left Ear: External ear normal.  Eyes:     General: No scleral icterus.       Right eye: No discharge.        Left eye: No discharge.     Conjunctiva/sclera: Conjunctivae normal.  Neck:     Trachea: No tracheal deviation.  Cardiovascular:     Rate and Rhythm: Normal rate and regular rhythm.  Pulmonary:     Effort: Pulmonary effort is normal. No respiratory distress.     Breath sounds: Normal breath sounds. No stridor. No wheezing or rales.  Abdominal:     General: Bowel sounds are normal. There is no distension.     Palpations: Abdomen is soft.     Tenderness: There is no abdominal  tenderness. There is no guarding or rebound.  Musculoskeletal:        General: No tenderness or deformity.     Cervical back: Neck supple.  Skin:    General: Skin is warm and dry.     Findings: No rash.  Neurological:     General: No focal deficit present.     Mental Status: Stanley Edwards is alert.     Cranial Nerves: No cranial nerve deficit, dysarthria or facial asymmetry.     Sensory: No sensory deficit.     Motor: No abnormal muscle tone or seizure activity.     Coordination: Coordination normal.  Psychiatric:        Mood and Affect: Mood normal.     (all labs ordered are listed, but only abnormal results are displayed) Labs Reviewed  BETA-HYDROXYBUTYRIC ACID - Abnormal; Notable for the following components:      Result Value   Beta-Hydroxybutyric Acid >8.00 (*)     All other components within normal limits  CBC WITH DIFFERENTIAL/PLATELET - Abnormal; Notable for the following components:   MCV 106.6 (*)    All other components within normal limits  CBG MONITORING, ED - Abnormal; Notable for the following components:   Glucose-Capillary 528 (*)    All other components within normal limits  I-STAT VENOUS BLOOD GAS, ED - Abnormal; Notable for the following components:   pH, Ven 7.244 (*)    pCO2, Ven 31.1 (*)    pO2, Ven 95 (*)    Bicarbonate 13.5 (*)    TCO2 14 (*)    Acid-base deficit 13.0 (*)    Sodium 148 (*)    Potassium 5.4 (*)    Calcium , Ion 1.07 (*)    All other components within normal limits  CBG MONITORING, ED - Abnormal; Notable for the following components:   Glucose-Capillary 529 (*)    All other components within normal limits  BASIC METABOLIC PANEL WITH GFR  BASIC METABOLIC PANEL WITH GFR  BASIC METABOLIC PANEL WITH GFR  BASIC METABOLIC PANEL WITH GFR  BETA-HYDROXYBUTYRIC ACID  BETA-HYDROXYBUTYRIC ACID  BETA-HYDROXYBUTYRIC ACID  URINALYSIS, ROUTINE W REFLEX MICROSCOPIC    EKG: None  Radiology: No results found.   .Critical Care  Performed by: Randol Simmonds, MD Authorized by: Randol Simmonds, MD   Critical care provider statement:    Critical care time (minutes):  30   Critical care was time spent personally by me on the following activities:  Development of treatment plan with patient or surrogate, discussions with consultants, evaluation of patient's response to treatment, examination of patient, ordering and review of laboratory studies, ordering and review of radiographic studies, ordering and performing treatments and interventions, pulse oximetry, re-evaluation of patient's condition and review of old charts    Medications Ordered in the ED  insulin  regular, human (MYXREDLIN ) 100 units/ 100 mL infusion (has no administration in time range)  dextrose  50 % solution 0-50 mL (has no administration in time range)   dextrose  5 % in lactated ringers  infusion (has no administration in time range)  lactated ringers  bolus 1,906 mL (1,906 mLs Intravenous New Bag/Given 07/23/24 1351)    Clinical Course as of 07/23/24 1522  Sat Jul 23, 2024  1411 I-Stat Venous Blood Gas, ED(!) Venous blood gas decreased at 7.2 concerning for DKA [JK]  1517 Beta-hydroxybutyric acid(!) Hydroxybutyric acid is elevated [JK]    Clinical Course User Index [JK] Randol Simmonds, MD  Medical Decision Making Problems Addressed: Diabetic ketoacidosis without coma associated with type 1 diabetes mellitus (HCC): acute illness or injury that poses a threat to life or bodily functions  Amount and/or Complexity of Data Reviewed Labs: ordered. Decision-making details documented in ED Course.  Risk Prescription drug management. Decision regarding hospitalization.   Patient presents ED for evaluation of hyperglycemia.  Patient has not been taking his medications for the last several days.  I suspect this may be etiology for his DKA.  Patient has an anion gap acidosis but otherwise is hemodynamically stable.  No signs of an acute infection at this time.  Patient has been started on IV fluids.  IV insulin  has also been ordered.  I will consult the medical service for admission.     Final diagnoses:  Diabetic ketoacidosis without coma associated with type 1 diabetes mellitus [facility])    ED Discharge Orders     None          Randol Simmonds, MD 07/23/24 1539  "

## 2024-07-23 NOTE — Hospital Course (Addendum)
 Stanley Edwards is a 58 year old male with a history of uncontrolled DM II, HLD, and medication noncompliance presents to the ED with a chief complaint of not feeling well. He reports several days of poor oral intake associated with nausea and increased thirst. He also endorses vague abdominal discomfort but denies vomiting or focal abdominal pain.  The patient states he has not been taking his insulin  for several days due to poor oral intake and because he ran out of his medications. He denies headache, chest pain, shortness of breath, or other acute complaints. He reports that his current symptoms feel similar to prior episodes of diabetic ketoacidosis.    ED Evaluation:  Blood pressure 125/73, pulse (!) 110, temperature 98.5 F (36.9 C), temperature source Oral, resp. rate 18, height 6' 2 (1.88 m), weight 95.3 kg, SpO2 99%. Labs: 150, 148, K5.6, 5.4, CO2 12, glucose 569, BUN 56, creatinine 2.60, anion gap 35, calcium  9.6, ionized calcium  1.07, GFR 28, beta hydroxy >8.0,  UA straw, glucose greater than 500, ketone 80, negative for leukocytes, nitrites.    Patient was deemed and found in diabetic ketoacidotic state. Started on insulin  drip and IV fluids. Admitted for diabetic ketoacidosis.

## 2024-07-23 NOTE — Assessment & Plan Note (Signed)
 Due to diabetic ketoacidosis, severe dehydration -Continue IV fluid resuscitation -Monitoring kidney function closely Creatinine 2.60, BUN 56, GFR 28 Lab Results  Component Value Date   CREATININE 2.60 (H) 07/23/2024   CREATININE 1.50 (H) 05/23/2024   CREATININE 1.30 (H) 01/25/2024

## 2024-07-23 NOTE — Assessment & Plan Note (Addendum)
 Diabetic ketoacidosis in setting of uncontrolled diabetes mellitus type II with hemoglobin A1c of 10.06 Glucose 569, BUN 56, Cr.2.60, GFR 28, positive ketones in urine  Serum anion Gap 35, Beta hydroxy >8.0,   -N.p.o. -IV fluid -As needed antiemetics -Initiating Endo tool, aggressive IV fluid resuscitation, insulin  drip Checking CBG every 1 hour then every 4 hours - Once anion gap is closed we will reinitiate long-acting insulin , SSI coverage -Consulting diabetic coordinator

## 2024-07-23 NOTE — ED Triage Notes (Signed)
 Pt coming from home with elevated CBG greater than 500. Reports pt is noncompliant with insulin . A&Ox3. Complains of dark urine and excessive\ thirst.  EMS vitals BP 114/77 P117 CBG 587 O2 98% RA

## 2024-07-23 NOTE — Assessment & Plan Note (Signed)
 Currently well-controlled, Home medication reviewed no BP meds listed

## 2024-07-23 NOTE — Progress Notes (Signed)
" ° ° °  EXPEDITER LEVEL LOADING ASSESSMENT NOTE  Patient Name: Stanley Edwards  DOB:[birth year] Date of Admission: 07/23/2024  Date of Assessment:07/23/2024   -------------------------------------------------------------------------------------------------------------------   Brief clinical summary: 58 year old male presented to the ED with a chief complaint of not feeling well over past several days.   Is there Bed Availability at another [facility]? Yes  If yes, what facility: [facility]  Level of Care Needed:  Yes  MD Agree to transfer: Yes  Patient agrees to transfer: Yes    -------------------------------------------------------------------------------------------------------------------  [facility] RN Expediter, Maureen Delatte S Vetra Shinall Please contact us  directly via secure chat (search for [facility]) or by calling us  at [phone] [facility]). "

## 2024-07-23 NOTE — Assessment & Plan Note (Deleted)
 Due to diabetic ketoacidosis -dehydration, ketoacidosis -Continue aggressive IV fluid hydration -Monitor closely

## 2024-07-23 NOTE — Assessment & Plan Note (Signed)
-   Continue statin-atorvastatin

## 2024-07-24 DIAGNOSIS — Z79899 Other long term (current) drug therapy: Secondary | ICD-10-CM | POA: Diagnosis not present

## 2024-07-24 DIAGNOSIS — E870 Hyperosmolality and hypernatremia: Secondary | ICD-10-CM | POA: Diagnosis present

## 2024-07-24 DIAGNOSIS — Z833 Family history of diabetes mellitus: Secondary | ICD-10-CM | POA: Diagnosis not present

## 2024-07-24 DIAGNOSIS — R54 Age-related physical debility: Secondary | ICD-10-CM | POA: Diagnosis present

## 2024-07-24 DIAGNOSIS — E1010 Type 1 diabetes mellitus with ketoacidosis without coma: Secondary | ICD-10-CM | POA: Diagnosis present

## 2024-07-24 DIAGNOSIS — Z91138 Patient's unintentional underdosing of medication regimen for other reason: Secondary | ICD-10-CM | POA: Diagnosis not present

## 2024-07-24 DIAGNOSIS — E7849 Other hyperlipidemia: Secondary | ICD-10-CM | POA: Diagnosis present

## 2024-07-24 DIAGNOSIS — E860 Dehydration: Secondary | ICD-10-CM | POA: Diagnosis present

## 2024-07-24 DIAGNOSIS — Z87891 Personal history of nicotine dependence: Secondary | ICD-10-CM | POA: Diagnosis not present

## 2024-07-24 DIAGNOSIS — T383X6A Underdosing of insulin and oral hypoglycemic [antidiabetic] drugs, initial encounter: Secondary | ICD-10-CM | POA: Diagnosis present

## 2024-07-24 DIAGNOSIS — E1110 Type 2 diabetes mellitus with ketoacidosis without coma: Secondary | ICD-10-CM | POA: Diagnosis not present

## 2024-07-24 DIAGNOSIS — Z794 Long term (current) use of insulin: Secondary | ICD-10-CM | POA: Diagnosis not present

## 2024-07-24 DIAGNOSIS — E875 Hyperkalemia: Secondary | ICD-10-CM | POA: Diagnosis present

## 2024-07-24 DIAGNOSIS — I10 Essential (primary) hypertension: Secondary | ICD-10-CM | POA: Diagnosis present

## 2024-07-24 DIAGNOSIS — E1069 Type 1 diabetes mellitus with other specified complication: Secondary | ICD-10-CM | POA: Diagnosis present

## 2024-07-24 DIAGNOSIS — R739 Hyperglycemia, unspecified: Secondary | ICD-10-CM | POA: Diagnosis present

## 2024-07-24 LAB — BASIC METABOLIC PANEL WITH GFR
Anion gap: 12 (ref 5–15)
Anion gap: 12 (ref 5–15)
BUN: 42 mg/dL — ABNORMAL HIGH (ref 6–20)
BUN: 38 mg/dL — ABNORMAL HIGH (ref 6–20)
CO2: 27 mmol/L (ref 22–32)
CO2: 25 mmol/L (ref 22–32)
Calcium: 9.2 mg/dL (ref 8.9–10.3)
Calcium: 9.1 mg/dL (ref 8.9–10.3)
Chloride: 114 mmol/L — ABNORMAL HIGH (ref 98–111)
Chloride: 114 mmol/L — ABNORMAL HIGH (ref 98–111)
Creatinine, Ser: 1.88 mg/dL — ABNORMAL HIGH (ref 0.61–1.24)
Creatinine, Ser: 1.65 mg/dL — ABNORMAL HIGH (ref 0.61–1.24)
GFR, Estimated: 41 mL/min — ABNORMAL LOW
GFR, Estimated: 48 mL/min — ABNORMAL LOW
Glucose, Bld: 168 mg/dL — ABNORMAL HIGH (ref 70–99)
Glucose, Bld: 144 mg/dL — ABNORMAL HIGH (ref 70–99)
Potassium: 4.2 mmol/L (ref 3.5–5.1)
Potassium: 4 mmol/L (ref 3.5–5.1)
Sodium: 153 mmol/L — ABNORMAL HIGH (ref 135–145)
Sodium: 150 mmol/L — ABNORMAL HIGH (ref 135–145)

## 2024-07-24 LAB — GLUCOSE, CAPILLARY
Glucose-Capillary: 133 mg/dL — ABNORMAL HIGH (ref 70–99)
Glucose-Capillary: 147 mg/dL — ABNORMAL HIGH (ref 70–99)
Glucose-Capillary: 149 mg/dL — ABNORMAL HIGH (ref 70–99)
Glucose-Capillary: 152 mg/dL — ABNORMAL HIGH (ref 70–99)
Glucose-Capillary: 165 mg/dL — ABNORMAL HIGH (ref 70–99)
Glucose-Capillary: 167 mg/dL — ABNORMAL HIGH (ref 70–99)
Glucose-Capillary: 182 mg/dL — ABNORMAL HIGH (ref 70–99)
Glucose-Capillary: 250 mg/dL — ABNORMAL HIGH (ref 70–99)

## 2024-07-24 LAB — BETA-HYDROXYBUTYRIC ACID
Beta-Hydroxybutyric Acid: 2.29 mmol/L — ABNORMAL HIGH (ref 0.05–0.27)
Beta-Hydroxybutyric Acid: 2.29 mmol/L — ABNORMAL HIGH (ref 0.05–0.27)
Beta-Hydroxybutyric Acid: 1.53 mmol/L — ABNORMAL HIGH (ref 0.05–0.27)

## 2024-07-24 MED ORDER — INSULIN ASPART 100 UNIT/ML IJ SOLN
0.0000 [IU] | Freq: Three times a day (TID) | INTRAMUSCULAR | Status: DC
  Administered 2024-07-24: 7 [IU] via SUBCUTANEOUS
  Administered 2024-07-25: 2 [IU] via SUBCUTANEOUS
  Administered 2024-07-25: 4 [IU] via SUBCUTANEOUS
  Filled 2024-07-24: qty 7

## 2024-07-24 MED ORDER — DEXTROSE-SODIUM CHLORIDE 5-0.45 % IV SOLN: INTRAVENOUS | Status: DC

## 2024-07-24 MED ORDER — KCL IN DEXTROSE-NACL 20-5-0.45 MEQ/L-%-% IV SOLN
INTRAVENOUS | Status: DC
  Filled 2024-07-24: qty 1000

## 2024-07-24 MED ORDER — INSULIN ASPART 100 UNIT/ML IJ SOLN: 0.0000 [IU] | Freq: Every day | INTRAMUSCULAR | Status: DC

## 2024-07-24 MED ORDER — INSULIN ASPART 100 UNIT/ML IJ SOLN
0.0000 [IU] | Freq: Three times a day (TID) | INTRAMUSCULAR | Status: DC
  Administered 2024-07-24 (×2): 1 [IU] via SUBCUTANEOUS
  Filled 2024-07-24 (×2): qty 1

## 2024-07-24 MED ORDER — INSULIN ASPART 100 UNIT/ML IJ SOLN: 10.0000 [IU] | Freq: Three times a day (TID) | INTRAMUSCULAR | Status: DC

## 2024-07-24 MED ORDER — INSULIN GLARGINE 100 UNIT/ML ~~LOC~~ SOLN
40.0000 [IU] | Freq: Every day | SUBCUTANEOUS | Status: DC
  Administered 2024-07-24 – 2024-07-25 (×2): 40 [IU] via SUBCUTANEOUS
  Filled 2024-07-24 (×2): qty 0.4

## 2024-07-24 NOTE — Evaluation (Signed)
 Occupational Therapy Evaluation Patient Details Name: Stanley Edwards MRN: [id number] DOB: Dec 12, 1965 Today's Date: 07/24/2024   History of Present Illness   Pt is a 58 y.o male admitted 12/27 for DKA, after insulin  noncompliance. PMH: HTN, DM     Clinical Impressions Pt admitted based on above, and was seen based on problem list below. PTA pt was independent with ADLs and IADLs. Today pt is at his baseline for ADLs and mobility. Pt completed LB dressing, standing ADLs, and ambulated 372ft in hallway with no AD. Pt reporting no concerns related towards ADLs and mobility at d/c, no follow up therapy needs. Encouraged pt to continue frequent mobilization with nursing staff and mobility specialists to prevent deconditioning during admission. All education complete, no further acute needs, OT is signing off on this pt.     If plan is discharge home, recommend the following:   Assist for transportation     Functional Status Assessment   Patient has not had a recent decline in their functional status     Equipment Recommendations   None recommended by OT      Precautions/Restrictions   Precautions Precautions: Fall Recall of Precautions/Restrictions: Intact Restrictions Weight Bearing Restrictions Per Provider Order: No     Mobility Bed Mobility Overal bed mobility: Independent      Transfers Overall transfer level: Independent Equipment used: None     General transfer comment: Ambulated 371ft, no AD, no sway      Balance Overall balance assessment: Independent     ADL either performed or assessed with clinical judgement   ADL Overall ADL's : Independent   General ADL Comments: LB dressing, standing ADLs, and functional transfers ind     Vision Baseline Vision/History: 0 No visual deficits Patient Visual Report: No change from baseline Vision Assessment?: No apparent visual deficits            Pertinent Vitals/Pain Pain Assessment Pain  Assessment: No/denies pain     Extremity/Trunk Assessment Upper Extremity Assessment Upper Extremity Assessment: Overall WFL for tasks assessed   Lower Extremity Assessment Lower Extremity Assessment: Overall WFL for tasks assessed   Cervical / Trunk Assessment Cervical / Trunk Assessment: Normal   Communication Communication Communication: No apparent difficulties   Cognition Arousal: Alert Behavior During Therapy: Flat affect Cognition: No apparent impairments       OT - Cognition Comments: pt answering all questions appropriately, just maintaing flat affect       Following commands: Intact       Cueing  General Comments   Cueing Techniques: Verbal cues  VSS on RA           Home Living Family/patient expects to be discharged to:: Private residence Living Arrangements: Other relatives Available Help at Discharge: Family Type of Home: House Home Access: Stairs to enter Secretary/administrator of Steps: 8 Entrance Stairs-Rails: Can reach both Home Layout: Multi-level;Able to live on main level with bedroom/bathroom Alternate Level Stairs-Number of Steps: 2 flights (Pt sleeps on top level) Alternate Level Stairs-Rails: Right;Left Bathroom Shower/Tub: Chief Strategy Officer: Standard     Home Equipment: None          Prior Functioning/Environment Prior Level of Function : Independent/Modified Independent         Mobility Comments: ind, no AD ADLs Comments: Ind    OT Problem List: Other (comment) (DKA)        OT Goals(Current goals can be found in the care plan section)   Acute Rehab OT  Goals Patient Stated Goal: To take a bath OT Goal Formulation: All assessment and education complete, DC therapy Time For Goal Achievement: 08/07/24 Potential to Achieve Goals: Good   AM-PAC OT 6 Clicks Daily Activity     Outcome Measure Help from another person eating meals?: None Help from another person taking care of personal grooming?:  None Help from another person toileting, which includes using toliet, bedpan, or urinal?: None Help from another person bathing (including washing, rinsing, drying)?: None Help from another person to put on and taking off regular upper body clothing?: None Help from another person to put on and taking off regular lower body clothing?: None 6 Click Score: 24   End of Session Nurse Communication: Mobility status  Activity Tolerance: Patient tolerated treatment well Patient left: in bed;with call bell/phone within reach  OT Visit Diagnosis: Muscle weakness (generalized) (M62.81);Other (comment) (DKA)                Time: 9167-9095 OT Time Calculation (min): 32 min Charges:  OT General Charges $OT Visit: 1 Visit OT Evaluation $OT Eval Low Complexity: 1 Low OT Treatments $Self Care/Home Management : 8-22 mins  Stanley BROCKS, OT  Acute Rehabilitation Services Office [phone] Secure chat preferred   Stanley Edwards 07/24/2024, 9:20 AM

## 2024-07-24 NOTE — Progress Notes (Signed)
 " PROGRESS NOTE    Stanley Edwards  FMW:[id number] DOB: 11-Feb-1966 DOA: 07/23/2024 PCP: Delbert Clam, MD  Subjective: No acute events overnight. Managed for DKA. Seen and examined at bedside. Reports feeling slightly better. Eager to try and eat food this morning. Denies nausea, vomiting, constipation.   Hospital Course: Stanley Edwards is a 58 year old male with a history of uncontrolled DM II, HLD, and medication noncompliance presents to the ED with a chief complaint of not feeling well. He reports several days of poor oral intake associated with nausea and increased thirst. He also endorses vague abdominal discomfort but denies vomiting or focal abdominal pain.  The patient states he has not been taking his insulin  for several days due to poor oral intake and because he ran out of his medications. He denies headache, chest pain, shortness of breath, or other acute complaints. He reports that his current symptoms feel similar to prior episodes of diabetic ketoacidosis.   Assessment and Plan:  Hypernatremia Due to dehyration from DKA  - status post IV fluids - encourage oral intake/hydration - monitor BMPs   DKA (diabetic ketoacidosis)  Uncontrolled T2DM At home in lantus  25 units every day and SSI Hemoglobin A1c of 12.1 < 10.06 Serum ketones down-trending Serum AG closed Serum glucose markedly improved - stopped insulin  drip - started on lantus  40 units every day - cont on SSI with FS ACHS Patient supposed to be having Dexcom -continuous glucose monitoring - diabetes educator consult pending  Hypertension Unclear about diagnosis and not on home meds Monitor clinically  Acure renal insufficiancy  Acute kidney injury ruled out Due to diabetic ketoacidosis, severe dehydration - Cr 1.65 < 2.15, baseline ~ 1.8 - status post IV fluid resuscitation - encourage oral intake/hydration - Monitoring BMP   Hyperlipidemia  Continue statin (atorvastatin )  DVT prophylaxis:  heparin  injection 5,000 Units Start: 07/23/24 1545 SCDs Start: 07/23/24 1536     Code Status: Full Code  Disposition Plan: Home Reason for continuing need for hospitalization: monitor for SQ insulin  requirements and oral intake  Objective: Vitals:   07/24/24 0429 07/24/24 0500 07/24/24 0740 07/24/24 1120  BP: 118/80  118/71 129/77  Pulse: 86  74 79  Resp: 17  10 18   Temp: 98.1 F (36.7 C)  97.6 F (36.4 C) 97.7 F (36.5 C)  TempSrc: Oral  Oral Oral  SpO2: 98%  100% 100%  Weight:  73.9 kg    Height:        Intake/Output Summary (Last 24 hours) at 07/24/2024 1353 Last data filed at 07/24/2024 1132 Gross per 24 hour  Intake 1884.55 ml  Output 400 ml  Net 1484.55 ml   Filed Weights   07/23/24 1332 07/23/24 2038 07/24/24 0500  Weight: 95.3 kg 73.9 kg 73.9 kg    Examination:  Physical Exam Vitals and nursing note reviewed.  Constitutional:      General: He is not in acute distress.    Appearance: He is ill-appearing.     Comments: frail  HENT:     Head: Normocephalic and atraumatic.  Cardiovascular:     Rate and Rhythm: Normal rate and regular rhythm.     Pulses: Normal pulses.     Heart sounds: Normal heart sounds.  Pulmonary:     Effort: Pulmonary effort is normal.     Breath sounds: Normal breath sounds.  Abdominal:     General: Bowel sounds are normal. There is no distension.     Palpations: Abdomen is soft.  Tenderness: There is no abdominal tenderness.  Neurological:     Mental Status: He is alert.     Data Reviewed: I have personally reviewed following labs and imaging studies  CBC: Recent Labs  Lab 07/23/24 1344 07/23/24 1406  WBC 8.0  --   NEUTROABS 6.9  --   HGB 14.2 14.3  HCT 45.0 42.0  MCV 106.6*  --   PLT 374  --    Basic Metabolic Panel: Recent Labs  Lab 07/23/24 1344 07/23/24 1406 07/23/24 1702 07/23/24 2123 07/24/24 0204 07/24/24 1049  NA 150* 148* 151* 152* 153* 150*  K 5.6* 5.4* 4.8 4.5 4.2 4.0  CL 103  --  106 112*  114* 114*  CO2 12*  --  15* 17* 27 25  GLUCOSE 569*  --  470* 250* 168* 144*  BUN 56*  --  53* 49* 42* 38*  CREATININE 2.60*  --  2.48* 2.15* 1.88* 1.65*  CALCIUM  9.5  --  9.4 9.6 9.2 9.1  MG  --   --  3.1*  --   --   --   PHOS  --   --  5.0*  --   --   --    GFR: Estimated Creatinine Clearance: 50.4 mL/min (A) (by C-G formula based on SCr of 1.65 mg/dL (H)). Liver Function Tests: No results for input(s): AST, ALT, ALKPHOS, BILITOT, PROT, ALBUMIN in the last 168 hours. No results for input(s): LIPASE, AMYLASE in the last 168 hours. No results for input(s): AMMONIA in the last 168 hours. Coagulation Profile: Recent Labs  Lab 07/23/24 2123  INR 1.0   Cardiac Enzymes: No results for input(s): CKTOTAL, CKMB, CKMBINDEX, TROPONINI in the last 168 hours. ProBNP, BNP (last 5 results) No results for input(s): PROBNP, BNP in the last 8760 hours. HbA1C: Recent Labs    07/23/24 1344  HGBA1C 12.1*   CBG: Recent Labs  Lab 07/24/24 0129 07/24/24 0234 07/24/24 0423 07/24/24 0639 07/24/24 1120  GLUCAP 167* 165* 149* 133* 147*   Lipid Profile: No results for input(s): CHOL, HDL, LDLCALC, TRIG, CHOLHDL, LDLDIRECT in the last 72 hours. Thyroid Function Tests: No results for input(s): TSH, T4TOTAL, FREET4, T3FREE, THYROIDAB in the last 72 hours. Anemia Panel: No results for input(s): VITAMINB12, FOLATE, FERRITIN, TIBC, IRON, RETICCTPCT in the last 72 hours. Sepsis Labs: No results for input(s): PROCALCITON, LATICACIDVEN in the last 168 hours.  No results found for this or any previous visit (from the past 240 hours).   Radiology Studies: No results found.  Scheduled Meds:  atorvastatin   40 mg Oral Daily   heparin   5,000 Units Subcutaneous Q8H   insulin  aspart  0-5 Units Subcutaneous QHS   insulin  aspart  0-9 Units Subcutaneous TID WC   insulin  glargine  40 Units Subcutaneous Daily   sodium chloride  flush  3 mL  Intravenous Q12H   sodium chloride  flush  3 mL Intravenous Q12H   Continuous Infusions:   LOS: 0 days   Norval Bar, MD  [facility]  07/24/2024, 1:53 PM   "

## 2024-07-24 NOTE — Inpatient Diabetes Management (Signed)
 Inpatient Diabetes Program Recommendations  AACE/ADA: New Consensus Statement on Inpatient Glycemic Control (2015)  Target Ranges:  Prepandial:   less than 140 mg/dL      Peak postprandial:   less than 180 mg/dL (1-2 hours)      Critically ill patients:  140 - 180 mg/dL   Lab Results  Component Value Date   GLUCAP 133 (H) 07/24/2024   HGBA1C 12.1 (H) 07/23/2024    Review of Glycemic Control  Diabetes history: DM 2 Outpatient Diabetes medications: Basaglar  24 units, Humalog  0-12 units tid before meals Current orders for Inpatient glycemic control:  IV insulin  transitioning to SQ Lantus  40 units Daily Novolog  0-9 units tid + hs  Goes to [facility] every 3 months to see PCP. Recent Diabetes Education with Herlene Salinas Ausdall PharmD on 06/27/2024 and reviewed insulin  and diet with pt.  Inpatient Diabetes Program Recommendations:    - May consider reducing Lantus  dose closer to home dose 25 units.  Will touch base with pt this admission 12/29. Pt has follow up and recent education. Pt just needs to be consistent with medications and monitoring. A1c has been >10% for at least the last 6 months which he has had several PCP and education appointments during that time.  Thanks, Clotilda Bull RN, MSN, BC-ADM Inpatient Diabetes Coordinator Team Pager [phone] (8a-5p)

## 2024-07-24 NOTE — Progress Notes (Signed)
 Lantus  40units was given at 0506, and insulin  drip was stopped at 0700. Pt refused CBG twice this morning, and Dr Cleatus was made aware. Dr. Cleatus was made aware that pt has not voided over night, but he refused to be bladder scanned. I'm awaiting response for Dr. Cleatus about pt not voiding.

## 2024-07-24 NOTE — Plan of Care (Signed)

## 2024-07-24 NOTE — Progress Notes (Signed)
 PT Cancellation Note  Patient Details Name: Stanley Edwards MRN: [id number] DOB: 05/14/1966   Cancelled Treatment:    Reason Eval/Treat Not Completed: Per OT, pt mobilizing independently with no PT needs identified. Will sign off. Please re-consult if new needs arise.   Kate ORN, PT, DPT Secure Chat Preferred  Rehab Office [phone]  Kate BRAVO [name] 07/24/2024, 11:27 AM

## 2024-07-25 ENCOUNTER — Other Ambulatory Visit: Payer: Self-pay

## 2024-07-25 ENCOUNTER — Other Ambulatory Visit (HOSPITAL_COMMUNITY): Payer: Self-pay

## 2024-07-25 DIAGNOSIS — E1110 Type 2 diabetes mellitus with ketoacidosis without coma: Secondary | ICD-10-CM | POA: Diagnosis not present

## 2024-07-25 LAB — BASIC METABOLIC PANEL WITH GFR
Anion gap: 10 (ref 5–15)
BUN: 30 mg/dL — ABNORMAL HIGH (ref 6–20)
CO2: 26 mmol/L (ref 22–32)
Calcium: 8.3 mg/dL — ABNORMAL LOW (ref 8.9–10.3)
Chloride: 105 mmol/L (ref 98–111)
Creatinine, Ser: 1.41 mg/dL — ABNORMAL HIGH (ref 0.61–1.24)
GFR, Estimated: 57 mL/min — ABNORMAL LOW
Glucose, Bld: 154 mg/dL — ABNORMAL HIGH (ref 70–99)
Potassium: 3.5 mmol/L (ref 3.5–5.1)
Sodium: 142 mmol/L (ref 135–145)

## 2024-07-25 LAB — GLUCOSE, CAPILLARY
Glucose-Capillary: 161 mg/dL — ABNORMAL HIGH (ref 70–99)
Glucose-Capillary: 216 mg/dL — ABNORMAL HIGH (ref 70–99)

## 2024-07-25 MED ORDER — INSULIN PEN NEEDLE 32G X 4 MM MISC
0 refills | Status: AC
  Filled 2024-07-25: qty 100, 30d supply, fill #0

## 2024-07-25 MED ORDER — BASAGLAR KWIKPEN 100 UNIT/ML ~~LOC~~ SOPN
40.0000 [IU] | PEN_INJECTOR | Freq: Every day | SUBCUTANEOUS | 6 refills | Status: DC
  Filled 2024-07-25 (×3): qty 30, 75d supply, fill #0

## 2024-07-25 NOTE — Plan of Care (Signed)
 " Problem: Education: Goal: Ability to describe self-care measures that may prevent or decrease complications (Diabetes Survival Skills Education) will improve Outcome: Adequate for Discharge Goal: Individualized Educational Video(s) Outcome: Adequate for Discharge   Problem: Coping: Goal: Ability to adjust to condition or change in health will improve Outcome: Adequate for Discharge   Problem: Fluid Volume: Goal: Ability to maintain a balanced intake and output will improve Outcome: Adequate for Discharge   Problem: Health Behavior/Discharge Planning: Goal: Ability to identify and utilize available resources and services will improve Outcome: Adequate for Discharge Goal: Ability to manage health-related needs will improve Outcome: Adequate for Discharge   Problem: Metabolic: Goal: Ability to maintain appropriate glucose levels will improve Outcome: Adequate for Discharge   Problem: Nutritional: Goal: Maintenance of adequate nutrition will improve 07/25/2024 1143 by Gail Cathryne SAILOR, RN Outcome: Adequate for Discharge 07/25/2024 0856 by Gail Cathryne SAILOR, RN Outcome: Progressing Goal: Progress toward achieving an optimal weight will improve Outcome: Adequate for Discharge   Problem: Skin Integrity: Goal: Risk for impaired skin integrity will decrease Outcome: Adequate for Discharge   Problem: Tissue Perfusion: Goal: Adequacy of tissue perfusion will improve Outcome: Adequate for Discharge   Problem: Education: Goal: Ability to describe self-care measures that may prevent or decrease complications (Diabetes Survival Skills Education) will improve Outcome: Adequate for Discharge Goal: Individualized Educational Video(s) Outcome: Adequate for Discharge   Problem: Cardiac: Goal: Ability to maintain an adequate cardiac output will improve Outcome: Adequate for Discharge   Problem: Health Behavior/Discharge Planning: Goal: Ability to identify and utilize available  resources and services will improve Outcome: Adequate for Discharge Goal: Ability to manage health-related needs will improve Outcome: Adequate for Discharge   Problem: Fluid Volume: Goal: Ability to achieve a balanced intake and output will improve Outcome: Adequate for Discharge   Problem: Metabolic: Goal: Ability to maintain appropriate glucose levels will improve Outcome: Adequate for Discharge   Problem: Nutritional: Goal: Maintenance of adequate nutrition will improve Outcome: Adequate for Discharge Goal: Maintenance of adequate weight for body size and type will improve Outcome: Adequate for Discharge   Problem: Respiratory: Goal: Will regain and/or maintain adequate ventilation Outcome: Adequate for Discharge   Problem: Urinary Elimination: Goal: Ability to achieve and maintain adequate renal perfusion and functioning will improve Outcome: Adequate for Discharge   Problem: Education: Goal: Knowledge of General Education information will improve Description: Including pain rating scale, medication(s)/side effects and non-pharmacologic comfort measures Outcome: Adequate for Discharge   Problem: Health Behavior/Discharge Planning: Goal: Ability to manage health-related needs will improve Outcome: Adequate for Discharge   Problem: Clinical Measurements: Goal: Ability to maintain clinical measurements within normal limits will improve Outcome: Adequate for Discharge Goal: Will remain free from infection Outcome: Adequate for Discharge Goal: Diagnostic test results will improve Outcome: Adequate for Discharge Goal: Respiratory complications will improve Outcome: Adequate for Discharge Goal: Cardiovascular complication will be avoided Outcome: Adequate for Discharge   Problem: Activity: Goal: Risk for activity intolerance will decrease Outcome: Adequate for Discharge   Problem: Nutrition: Goal: Adequate nutrition will be maintained Outcome: Adequate for  Discharge   Problem: Coping: Goal: Level of anxiety will decrease Outcome: Adequate for Discharge   Problem: Elimination: Goal: Will not experience complications related to bowel motility Outcome: Adequate for Discharge Goal: Will not experience complications related to urinary retention Outcome: Adequate for Discharge   Problem: Pain Managment: Goal: General experience of comfort will improve and/or be controlled Outcome: Adequate for Discharge   Problem: Safety: Goal: Ability to remain free from  injury will improve Outcome: Adequate for Discharge   Problem: Skin Integrity: Goal: Risk for impaired skin integrity will decrease Outcome: Adequate for Discharge   "

## 2024-07-25 NOTE — Discharge Summary (Addendum)
 " [facility] Physician Discharge Summary   Patient name: Stanley Edwards  Admit date:     07/23/2024  Discharge date: 07/25/2024  Attending Physician: Yasira Engelson, HASSAN [[id number]]  Discharge Physician: Norval Bar   PCP: Delbert Clam, MD  Admitted From: Home  Disposition:  Home  Recommendations for Outpatient Follow-up:  Follow up with PCP in 1-2 weeks Follow up with endocrinologist in 2 weeks, referral given  Home Health:No Equipment/Devices: @ECDMELIST @  Discharge Condition:Stable CODE STATUS:FULL Diet recommendation: Heart Healthy/Diabetic Fluid Restriction: None  Hospital Summary: Stanley Edwards is a 58 year old male with a history of uncontrolled DM II, HLD, and medication noncompliance presents to the ED with a chief complaint of not feeling well. He reports several days of poor oral intake associated with nausea and increased thirst. He also endorses vague abdominal discomfort but denies vomiting or focal abdominal pain. The patient states he has not been taking his insulin  for several days due to poor oral intake and because he ran out of his medications. He denies headache, chest pain, shortness of breath, or other acute complaints. He reports that his current symptoms feel similar to prior episodes of diabetic ketoacidosis. Admitted for DKA that resolved quickly with IV insulin . Seen by diabetes educator. Stable for discharge back home at this time. - increased home lantus  to 40 units dailiy - continue on sliding scale insulin  - given referral to follow up with endocrinologist in 2 weeks - follow up with PCP within one week  Discharge Diagnoses:  Active Problems:   Hypertension   DKA (diabetic ketoacidosis) (HCC)   Hypernatremia   AKI (acute kidney injury)   Hyperkalemia   Uncontrolled type 2 diabetes mellitus with hyperglycemia, with long-term current use of insulin  (HCC)   Hyperlipidemia associated with type 2 diabetes mellitus [facility])   Discharge  Instructions  Discharge Instructions     Ambulatory referral to Endocrinology   Complete by: As directed    Diet Carb Modified   Complete by: As directed    Increase activity slowly   Complete by: As directed       Allergies as of 07/25/2024   No Known Allergies      Medication List     TAKE these medications    atorvastatin  40 MG tablet Commonly known as: LIPITOR Take 1 tablet (40 mg total) by mouth daily.   Basaglar  KwikPen 100 UNIT/ML Inject 40 Units into the skin daily. What changed: how much to take   Dexcom G7 Receiver Devi Use to check blood glucose continuously.   Dexcom G7 Sensor Misc Use to check blood sugar as directed. Change sensor every 10 days   insulin  lispro 100 UNIT/ML KwikPen Commonly known as: HumaLOG  KwikPen Inject 0-12 Units into the skin 3 (three) times daily before meals. Per Sliding scale.For blood sugars 0-150 give 0 units of insulin , 151-200 give 2 units of insulin , 201-250 give 4 units, 251-300 give 6 units, 301-350 give 8 units, 351-400 give 10 units,> 400 give 12 units.   TechLite Plus Pen Needles 32G X 4 MM Misc Generic drug: Insulin  Pen Needle Use to inject insulin  4 times daily.   True Metrix Blood Glucose Test test strip Generic drug: glucose blood Use to check blood sugar 3 times daily.   True Metrix Meter w/Device Kit Use to check blood sugar 3 times daily.   TRUEplus Lancets 28G Misc Use to check blood sugar 3 times daily.        Follow-up Information     Newlin,  Corrina, MD Follow up.   Specialty: Family Medicine Contact information: [address] [place] [address] [phone]                Allergies[1]  Discharge Exam: Vitals:   07/25/24 0735 07/25/24 1115  BP: 99/63 108/77  Pulse: 76 79  Resp: 18 18  Temp: 98 F (36.7 C) 98 F (36.7 C)  SpO2: 94% 96%    Physical Exam Vitals and nursing note reviewed.  Constitutional:      General: He is not in acute distress.     Appearance: He is not ill-appearing.  HENT:     Head: Normocephalic and atraumatic.  Cardiovascular:     Rate and Rhythm: Normal rate and regular rhythm.     Pulses: Normal pulses.     Heart sounds: Normal heart sounds.  Pulmonary:     Effort: Pulmonary effort is normal.     Breath sounds: Normal breath sounds.  Abdominal:     General: Bowel sounds are normal.     Palpations: Abdomen is soft.  Neurological:     Mental Status: He is alert. Mental status is at baseline.     The results of significant diagnostics from this hospitalization (including imaging, microbiology, ancillary and laboratory) are listed below for reference.    Microbiology: No results found for this or any previous visit (from the past 240 hours).   Labs: ProBNP, BNP (last 5 results) No results for input(s): PROBNP, BNP in the last 8760 hours. Basic Metabolic Panel: Recent Labs  Lab 07/23/24 1702 07/23/24 2123 07/24/24 0204 07/24/24 1049 07/25/24 0200  NA 151* 152* 153* 150* 142  K 4.8 4.5 4.2 4.0 3.5  CL 106 112* 114* 114* 105  CO2 15* 17* 27 25 26   GLUCOSE 470* 250* 168* 144* 154*  BUN 53* 49* 42* 38* 30*  CREATININE 2.48* 2.15* 1.88* 1.65* 1.41*  CALCIUM  9.4 9.6 9.2 9.1 8.3*  MG 3.1*  --   --   --   --   PHOS 5.0*  --   --   --   --    Liver Function Tests: No results for input(s): AST, ALT, ALKPHOS, BILITOT, PROT, ALBUMIN in the last 168 hours. No results for input(s): LIPASE, AMYLASE in the last 168 hours. No results for input(s): AMMONIA in the last 168 hours. CBC: Recent Labs  Lab 07/23/24 1344 07/23/24 1406  WBC 8.0  --   NEUTROABS 6.9  --   HGB 14.2 14.3  HCT 45.0 42.0  MCV 106.6*  --   PLT 374  --    Cardiac Enzymes: No results for input(s): CKTOTAL, CKMB, CKMBINDEX, TROPONINI, TROPONINIHS in the last 168 hours. BNP: No results for input(s): BNP in the last 168 hours. CBG: Recent Labs  Lab 07/24/24 1120 07/24/24 1553 07/24/24 2202  07/25/24 0607 07/25/24 1112  GLUCAP 147* 250* 182* 161* 216*   D-Dimer No results for input(s): DDIMER in the last 72 hours. Hgb A1c Recent Labs    07/23/24 1344  HGBA1C 12.1*   Lipid Profile No results for input(s): CHOL, HDL, LDLCALC, TRIG, CHOLHDL, LDLDIRECT in the last 72 hours. Thyroid function studies No results for input(s): TSH, T4TOTAL, FREET4, T3FREE, THYROIDAB in the last 72 hours.  Invalid input(s): FREET3 Anemia work up No results for input(s): VITAMINB12, FOLATE, FERRITIN, TIBC, IRON, RETICCTPCT in the last 72 hours. Urinalysis    Component Value Date/Time   COLORURINE STRAW (A) 07/23/2024 1335   APPEARANCEUR CLEAR 07/23/2024  1335   LABSPEC 1.023 07/23/2024 1335   PHURINE 5.0 07/23/2024 1335   GLUCOSEU >=500 (A) 07/23/2024 1335   HGBUR NEGATIVE 07/23/2024 1335   BILIRUBINUR NEGATIVE 07/23/2024 1335   BILIRUBINUR negative 07/10/2022 1216   KETONESUR 80 (A) 07/23/2024 1335   PROTEINUR NEGATIVE 07/23/2024 1335   UROBILINOGEN 0.2 07/10/2022 1216   NITRITE NEGATIVE 07/23/2024 1335   LEUKOCYTESUR NEGATIVE 07/23/2024 1335   Sepsis Labs Recent Labs  Lab 07/23/24 1344  WBC 8.0    Procedures/Studies: No results found.  Time coordinating discharge: 45 mins  SIGNED:  Norval Bar, MD [facility] 07/25/2024, 12:11 PM     [1] No Known Allergies  "

## 2024-07-25 NOTE — Plan of Care (Signed)

## 2024-07-25 NOTE — TOC Transition Note (Signed)
 Transition of Care [facility]) - Discharge Note   Patient Details  Name: Stanley Edwards MRN: [id number] Date of Birth: [birth year]  Transition of Care [facility]) CM/SW Contact:  Andrez JULIANNA George, RN Phone Number: 07/25/2024, 12:16 PM   Clinical Narrative:     Pt is discharging home with self care. No needs per IP Care management. Pt has transport home.   Final next level of care: Home/Self Care Barriers to Discharge: No Barriers Identified   Patient Goals and CMS Choice            Discharge Placement                       Discharge Plan and Services Additional resources added to the After Visit Summary for                                       Social Drivers of Health (SDOH) Interventions SDOH Screenings   Food Insecurity: No Food Insecurity (07/24/2024)  Housing: Low Risk (07/24/2024)  Transportation Needs: No Transportation Needs (07/24/2024)  Utilities: Not At Risk (07/24/2024)  Depression (PHQ2-9): Low Risk (05/23/2024)  Tobacco Use: Medium Risk (06/27/2024)     Readmission Risk Interventions    04/28/2022   10:03 AM  Readmission Risk Prevention Plan  Transportation Screening Complete  PCP or Specialist Appt within 5-7 Days Complete  Home Care Screening Complete  Medication Review (RN CM) Complete

## 2024-07-25 NOTE — Plan of Care (Signed)
   Problem: Nutritional: Goal: Maintenance of adequate nutrition will improve Outcome: Progressing

## 2024-07-25 NOTE — Discharge Instructions (Signed)
 Take insulin  as prescribed Follow up with endocrinologist in 2 weeks Follow up with PCP within one week

## 2024-07-25 NOTE — Inpatient Diabetes Management (Signed)
 Inpatient Diabetes Program Recommendations  AACE/ADA: New Consensus Statement on Inpatient Glycemic Control (2015)  Target Ranges:  Prepandial:   less than 140 mg/dL      Peak postprandial:   less than 180 mg/dL (1-2 hours)      Critically ill patients:  140 - 180 mg/dL   Lab Results  Component Value Date   GLUCAP 216 (H) 07/25/2024   HGBA1C 12.1 (H) 07/23/2024    Review of Glycemic Control  Latest Reference Range & Units 07/25/24 06:07 07/25/24 11:12  Glucose-Capillary 70 - 99 mg/dL 838 (H) 783 (H)  Diabetes history: DM 2 Outpatient Diabetes medications: Basaglar  24 units, Humalog  0-12 units tid before meals Current orders for Inpatient glycemic control:  IV insulin  transitioning to SQ Lantus  40 units Daily Novolog  0-9 units tid + hs Inpatient Diabetes Program Recommendations:    Spoke briefly with patient by phone prior to his discharge.  He states that he has insulin  at home but that he was feeling tired and just did not take it.  He says  I will never do that again.  He has a Dexcom G7 sensor and states he has all supplies needed at home.  We briefly reviewed his current A1C of 12.1% and reminded him that goal is 7%.  He plans to f/u with PCP and note referral for endocrinology.  He reports that he has no further needs at this time.   Thanks,  Randall Bullocks, RN, BC-ADM Inpatient Diabetes Coordinator Pager [phone]  (8a-5p)

## 2024-07-25 NOTE — Progress Notes (Signed)
 DISCHARGE NOTE HOME Stanley Edwards to be discharged Home per MD order.Floor RN  Discussed prescriptions and follow up appointments with the patient. Prescriptions given to patient; medication list explained in detail. Patient verbalized understanding.  Skin clean, dry and intact without evidence of skin break down, no evidence of skin tears noted. IV catheter discontinued intact. Site without signs and symptoms of complications. Dressing and pressure applied. Pt denies pain at the site currently. No complaints noted.  Patient free of lines, drains, and wounds.   An After Visit Summary (AVS) was printed and given to the patient.taken to discharge lounge to wait for ride Patient escorted via wheelchair, and discharged home via private auto.  Peyton [name], RN

## 2024-07-26 ENCOUNTER — Telehealth: Payer: Self-pay | Admitting: *Deleted

## 2024-07-26 NOTE — Transitions of Care (Post Inpatient/ED Visit) (Signed)
" ° °  07/26/2024  Name: Stanley Edwards MRN: [id number] DOB: 1965-12-23  Today's TOC FU Call Status: Today's TOC FU Call Status:: Unsuccessful Call (1st Attempt) Unsuccessful Call (1st Attempt) Date: 07/26/24  Attempted to reach the patient regarding the most recent Inpatient/ED visit.  Follow Up Plan: Additional outreach attempts will be made to reach the patient to complete the Transitions of Care (Post Inpatient/ED visit) call.   Andrea Dimes RN, BSN [place]  [facility] RN Care Manager [phone]  "

## 2024-07-27 ENCOUNTER — Telehealth: Payer: Self-pay

## 2024-07-27 ENCOUNTER — Other Ambulatory Visit (HOSPITAL_COMMUNITY): Payer: Self-pay

## 2024-07-27 NOTE — Transitions of Care (Post Inpatient/ED Visit) (Signed)
" ° °  07/27/2024  Name: Stanley Edwards MRN: [id number] DOB: [birth year]  Today's TOC FU Call Status: Today's TOC FU Call Status:: Unsuccessful Call (2nd Attempt) Unsuccessful Call (2nd Attempt) Date: 07/27/24  Attempted to reach the patient regarding the most recent Inpatient/ED visit.  Follow Up Plan: Additional outreach attempts will be made to reach the patient to complete the Transitions of Care (Post Inpatient/ED visit) call.   Corin Tilly J. Darryel Diodato RN, MSN [place]  [facility] RN Care Manager Direct Dial : [phone]  Fax: [phone] Website: [email or web address]   "

## 2024-07-29 ENCOUNTER — Other Ambulatory Visit: Payer: Self-pay

## 2024-07-29 ENCOUNTER — Ambulatory Visit: Attending: Family Medicine | Admitting: Pharmacist

## 2024-07-29 ENCOUNTER — Encounter: Payer: Self-pay | Admitting: Pharmacist

## 2024-07-29 DIAGNOSIS — Z794 Long term (current) use of insulin: Secondary | ICD-10-CM

## 2024-07-29 DIAGNOSIS — Z1211 Encounter for screening for malignant neoplasm of colon: Secondary | ICD-10-CM

## 2024-07-29 DIAGNOSIS — E1165 Type 2 diabetes mellitus with hyperglycemia: Secondary | ICD-10-CM | POA: Diagnosis not present

## 2024-07-29 MED ORDER — INSULIN LISPRO (1 UNIT DIAL) 100 UNIT/ML (KWIKPEN)
0.0000 [IU] | PEN_INJECTOR | Freq: Three times a day (TID) | SUBCUTANEOUS | 0 refills | Status: DC
  Filled 2024-07-29: qty 12, 34d supply, fill #0

## 2024-07-29 MED ORDER — BASAGLAR KWIKPEN 100 UNIT/ML ~~LOC~~ SOPN
46.0000 [IU] | PEN_INJECTOR | Freq: Every day | SUBCUTANEOUS | 3 refills | Status: DC
  Filled 2024-07-29: qty 15, 32d supply, fill #0

## 2024-07-29 MED ORDER — NOVOLOG FLEXPEN 100 UNIT/ML ~~LOC~~ SOPN
PEN_INJECTOR | SUBCUTANEOUS | 2 refills | Status: AC
  Filled 2024-07-29: qty 9, 25d supply, fill #0

## 2024-07-29 NOTE — Progress Notes (Signed)
 "   S:     No chief complaint on file.  59 y.o. male who presents for diabetes evaluation, education, and management. Patient arrives in good spirits and presents without any assistance. PMH is significant for T2DM w/ a hx of DKA and long-term insulin  use, HTN, hyperlipidemia.   Patient was referred and last seen by Primary Care Provider, Dr. Delbert, on 05/23/24.   I saw him on 06/27/24. At that time, he reported adherence to Basaglar  24 units daily. He admitted to taking Humalog  ~45 minutes after eating. I advised him to change this to ~15 minutes before his meals. We continued his same insulin  doses.   Since that visit, he was hospitalized from 12/27 - 07/25/24 for DKA. Reported polydipsia with a several day hx prior to admission of nausea and poor PO intake. He admitted to skipping insulin  during that time due to this and because of running out of his insulin . A1c was 12.1% on admission with initial CBG >500 mg/dL. DKA resolved with IVF and insulin  drip. At discharge, the hospital team increased his home Lantus  dose to 40 units daily. He received a 75-day supply from [facility] pharmacy. The hospital continued his Humalog  sliding scale and he last received this on 05/23/2024 for a 25-day supply.   Today, pt reports that he is feeling better. He endorses adherence to his Basaglar . Takes 40 units at 6:30 PM daily. 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 His largest meal is at ~9 PM. He takes his Humalog  per sliding scale 15 minutes before eating.   Family/Social History:  -Fhx: DM -Tobacco: former smoker  -Alcohol : none reported   Current diabetes medications include: Basaglar  40 units daily, Humalog  0-12 units prior to eating per sliding scale (usually injecting ~8 units at one time) Current hypertension medications include: none Current hyperlipidemia medications include: atorvastatin  40 mg daily   Patient reports adherence to taking all medications as prescribed.   Insurance coverage: [employer]    Patient denies hypoglycemic events.  Patient reports polyuria. Patient denies neuropathy (nerve pain). Patient denies visual changes. Patient reports self foot exams.   Patient reported dietary habits: Eats 1-2 meals/day Largest meal is at work around 9 PM.   Patient-reported exercise habits: very active at work   O:  No GM or CGM with him today.  Uses Dexcom G7  Recalls 180s - 220s since getting out of the hospital. Largest meal: 7p-7a, will eat a meat and 2 vegetables   Lab Results  Component Value Date   HGBA1C 12.1 (H) 07/23/2024   There were no vitals filed for this visit.  Lipid Panel     Component Value Date/Time   CHOL 186 01/25/2024 1127   TRIG 63 01/25/2024 1127   HDL 44 01/25/2024 1127   CHOLHDL 4.2 01/25/2024 1127   CHOLHDL 5.8 06/26/2022 0128   VLDL 18 06/26/2022 0128   LDLCALC 130 (H) 01/25/2024 1127    Clinical Atherosclerotic Cardiovascular Disease (ASCVD): No  The 10-year ASCVD risk score (Arnett DK, et al., 2019) is: 11.1%   Values used to calculate the score:     Age: 28 years     Clinically relevant sex: Male     Is Non-Hispanic African American: Yes     Diabetic: Yes     Tobacco smoker: No     Systolic Blood Pressure: 108 mmHg     Is BP treated: No     HDL Cholesterol: 44 mg/dL     Total Cholesterol: 186  mg/dL   Patient is participating in a Managed Medicaid Plan: no   A/P: Diabetes longstanding currently above goal. Was hospitalized recently for DKA. Will get repeat CMP today. I am unable to review CGM data with no device present at today's visit. However, his reported blood sugars are still above goal. Patient is not symptomatic from a hyper- or hypoglycemia-associated standpoint. However, he is able to verbalize appropriate hypoglycemia management plan. Medication adherence appears to be okay.  -INCREASE Basaglar  to 46 units once daily. After 1 week, increase further to 50 units once daily if home sugars remain above goal.  -Continued  Humalog  SSI for now. Encouraged patient to take 15 minutes prior to a meal. -Patient educated on purpose, proper use, and potential adverse effects of Basaglar , Humalog .  -Extensively discussed pathophysiology of diabetes, recommended lifestyle interventions, dietary effects on blood sugar control.  -Counseled on s/sx of and management of hypoglycemia.  -Next A1c anticipated 07/2024.   Written patient instructions provided. Patient verbalized understanding of treatment plan.  Total time in face to face counseling 30 minutes.    Follow-up:  Pharmacist in 1 month  Herlene Fleeta Morris, PharmD, [place], CPP Clinical Pharmacist [facility] [phone]   "

## 2024-07-30 LAB — CMP14+EGFR
ALT: 24 IU/L (ref 0–44)
AST: 21 IU/L (ref 0–40)
Albumin: 3.8 g/dL (ref 3.8–4.9)
Alkaline Phosphatase: 104 IU/L (ref 47–123)
BUN/Creatinine Ratio: 19 (ref 9–20)
BUN: 21 mg/dL (ref 6–24)
Bilirubin Total: 0.3 mg/dL (ref 0.0–1.2)
CO2: 23 mmol/L (ref 20–29)
Calcium: 8.6 mg/dL — ABNORMAL LOW (ref 8.7–10.2)
Chloride: 100 mmol/L (ref 96–106)
Creatinine, Ser: 1.13 mg/dL (ref 0.76–1.27)
Globulin, Total: 2.6 g/dL (ref 1.5–4.5)
Glucose: 334 mg/dL — ABNORMAL HIGH (ref 70–99)
Potassium: 4.8 mmol/L (ref 3.5–5.2)
Sodium: 136 mmol/L (ref 134–144)
Total Protein: 6.4 g/dL (ref 6.0–8.5)
eGFR: 75 mL/min/1.73

## 2024-08-01 ENCOUNTER — Ambulatory Visit: Payer: Self-pay | Admitting: Family Medicine

## 2024-08-23 ENCOUNTER — Other Ambulatory Visit: Payer: Self-pay

## 2024-08-23 ENCOUNTER — Ambulatory Visit: Payer: Self-pay | Attending: Family Medicine | Admitting: Family Medicine

## 2024-08-23 ENCOUNTER — Encounter: Payer: Self-pay | Admitting: Family Medicine

## 2024-08-23 VITALS — BP 122/71 | HR 82 | Temp 98.4°F | Ht 74.0 in | Wt 203.8 lb

## 2024-08-23 DIAGNOSIS — E11649 Type 2 diabetes mellitus with hypoglycemia without coma: Secondary | ICD-10-CM

## 2024-08-23 DIAGNOSIS — Z1211 Encounter for screening for malignant neoplasm of colon: Secondary | ICD-10-CM

## 2024-08-23 DIAGNOSIS — G8929 Other chronic pain: Secondary | ICD-10-CM

## 2024-08-23 DIAGNOSIS — Z125 Encounter for screening for malignant neoplasm of prostate: Secondary | ICD-10-CM

## 2024-08-23 DIAGNOSIS — M25511 Pain in right shoulder: Secondary | ICD-10-CM

## 2024-08-23 DIAGNOSIS — Z794 Long term (current) use of insulin: Secondary | ICD-10-CM

## 2024-08-23 LAB — GLUCOSE, POCT (MANUAL RESULT ENTRY): POC Glucose: 101 mg/dL — AB (ref 70–99)

## 2024-08-23 MED ORDER — BASAGLAR KWIKPEN 100 UNIT/ML ~~LOC~~ SOPN
25.0000 [IU] | PEN_INJECTOR | Freq: Every day | SUBCUTANEOUS | 3 refills | Status: AC
  Filled 2024-08-23: qty 30, 120d supply, fill #0

## 2024-08-23 MED ORDER — MELOXICAM 7.5 MG PO TABS
7.5000 mg | ORAL_TABLET | Freq: Every day | ORAL | 1 refills | Status: AC
  Filled 2024-08-23: qty 30, 30d supply, fill #0

## 2024-08-23 NOTE — Patient Instructions (Signed)
 Please increase your Basaglar  from 20 units to 25 units and continue with your slidin scale as follows: For blood sugars 0-150 give 0 units of insulin , 151-200 give 2 units of insulin , 201-250 give 4 units, 251-300 give 6 units, 301-350 give 8 units, 351-400 give 10 units,> 400 give 12 units and call M.D.  If your blood sugars are above 300 persistently then increase Basaglar  to 30 units.

## 2024-08-23 NOTE — Progress Notes (Signed)
 "  Subjective:  Patient ID: Stanley Edwards, male    DOB: 06/18/1966  Age: 59 y.o. MRN: [id number]  CC: Medical Management of Chronic Issues (Discuss Insulin   )     Discussed the use of AI scribe software for clinical note transcription with the patient, who gave verbal consent to proceed.  History of Present Illness Stanley Edwards is a 59 year old male with  type II diabetes, hyperlipidemia  who presents for follow-up of blood sugar management and right shoulder pain.  Last month he was hospitalized for DKA.  Currently his blood sugar is 101, but at home it is often around 180 in the morning before breakfast. He uses 25 units of Basaglar  in the evening before work (rather than 46 units which was agreed upon at his visit with the clinical pharmacist) and Novolog  on a sliding scale, usually 9 to 15 units three times a day (even though his med list indicates he should be on the sliding scale 0 to 12 units 3 times daily before meals). Two weeks ago he had a significant blood sugar drop at work that did not register on the meter and he decreased his Basaglar  from 46 to 20 units. He uses finger sticks for monitoring.  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    Past Medical History:  Diagnosis Date   Allergy    Diabetes mellitus without complication (HCC)    Hypertension    Pt denies htn but it was on his previous hx    No past surgical history on file.  Family History  Problem Relation Age of Onset   Cancer Mother    Diabetes Father     Social History   Socioeconomic History   Marital status: Single    Spouse name: Not on file   Number of children: Not on file   Years of education: Not on file   Highest education level: Not on file  Occupational History   Not on file  Tobacco Use   Smoking status: Former    Types: Cigarettes   Smokeless tobacco: Never  Vaping Use   Vaping  status: Never Used  Substance and Sexual Activity   Alcohol  use: Not Currently    Alcohol /week: 1.0 standard drink of alcohol     Types: 1 Cans of beer per week   Drug use: No   Sexual activity: Yes  Other Topics Concern   Not on file  Social History Narrative   Not on file   Social Drivers of Health   Tobacco Use: Medium Risk (07/29/2024)   Patient History    Smoking Tobacco Use: Former    Smokeless Tobacco Use: Never    Passive Exposure: Not on Actuary Strain: Not on file  Food Insecurity: No Food Insecurity (07/24/2024)   Epic    Worried About Programme Researcher, Broadcasting/film/video in the Last Year: Never true    Ran Out of Food in the Last Year: Never true  Transportation Needs: No Transportation Needs (07/24/2024)   Epic    Lack of Transportation (Medical): No    Lack of Transportation (Non-Medical): No  Physical Activity: Not on file  Stress: Not on file  Social Connections: Not on file  Depression (PHQ2-9): Low Risk (05/23/2024)   Depression (PHQ2-9)    PHQ-2 Score: 1  Alcohol  Screen: Not on file  Housing: Low Risk (07/24/2024)   Epic    Unable to Pay for Housing in the Last Year: No    Number of Times Moved in the Last Year: 0    Homeless in the Last Year: No  Utilities: Not At Risk (07/24/2024)   Epic    Threatened with loss of utilities: No  Health Literacy: Not on file    Allergies[1]  Outpatient Medications Prior to Visit  Medication Sig Dispense Refill   insulin  aspart (NOVOLOG  FLEXPEN) 100 UNIT/ML FlexPen For blood sugars 0-150 give 0 units of insulin , 151-200 give 2 units of insulin , 201-250 give 4 units, 251-300 give 6 units, 301-350 give 8 units, 351-400 give 10 units,> 400 give 12 units. 15 mL 2   Insulin  Glargine (BASAGLAR  KWIKPEN) 100 UNIT/ML Inject 46 Units into the skin daily. After 1 week, patient may increase to 50 units if home sugars remain above goal. 15 mL 3   atorvastatin  (LIPITOR) 40 MG tablet Take 1 tablet (40 mg total) by mouth daily.  (Patient not taking: Reported on 08/23/2024) 90 tablet 1   Blood Glucose Monitoring Suppl (TRUE METRIX METER) w/Device KIT Use to check blood sugar 3 times daily. (Patient not taking: Reported on 08/23/2024) 1 kit 0   Continuous Glucose Receiver (DEXCOM G7 RECEIVER) DEVI Use to check blood glucose continuously. (Patient not taking: Reported on 08/23/2024) 1 each 0   Continuous Glucose Sensor (DEXCOM G7 SENSOR) MISC Use to check blood sugar as directed. Change sensor every 10 days (Patient not taking: Reported on 08/23/2024) 3 each 6   glucose blood (TRUE METRIX BLOOD GLUCOSE TEST) test strip Use to check blood sugar 3 times daily. (Patient not taking: Reported on 08/23/2024) 100 each 6   Insulin  Pen Needle (TECHLITE PLUS PEN NEEDLES) 32G X 4 MM MISC Use to inject insulin  4 times daily. (Patient not taking: Reported on 08/23/2024) 400 each 1   Insulin  Pen Needle 32G X 4 MM MISC Use as directed with insulin  pen (Patient not taking: Reported on 08/23/2024) 100 each 0   TRUEplus Lancets 28G MISC Use to check blood sugar 3 times daily. (Patient not taking: Reported on 08/23/2024) 100 each 6   No facility-administered medications prior to visit.     ROS Review of Systems  Constitutional:  Negative for activity change and appetite change.  HENT:  Negative for sinus pressure and sore throat.   Respiratory:  Negative for chest tightness, shortness of breath and wheezing.   Cardiovascular:  Negative for chest pain and palpitations.  Gastrointestinal:  Negative for abdominal distention, abdominal pain and constipation.  Genitourinary: Negative.   Musculoskeletal:        See HPI  Psychiatric/Behavioral:  Negative for behavioral problems and dysphoric mood.     Objective:  BP 122/71 (BP Location: Left Arm, Patient Position: Sitting, Cuff Size: Normal)   Pulse 82   Temp 98.4 F (36.9 C) (Oral)   Ht 6' 2 (1.88 m)   Wt 203 lb 12.8 oz (92.4 kg)   SpO2 100%   BMI 26.17 kg/m      08/23/2024   10:35 AM  07/25/2024   11:15 AM 07/25/2024    7:35 AM  BP/Weight  Systolic BP 122 108 99  Diastolic BP 71 77 63  Wt. (Lbs) 203.8    BMI 26.17 kg/m2        Physical Exam Constitutional:      Appearance: He is well-developed.  Cardiovascular:     Rate and Rhythm: Normal rate.  Heart sounds: Normal heart sounds. No murmur heard. Pulmonary:     Effort: Pulmonary effort is normal.     Breath sounds: Normal breath sounds. No wheezing or rales.  Chest:     Chest wall: No tenderness.  Abdominal:     General: Bowel sounds are normal. There is no distension.     Palpations: Abdomen is soft. There is no mass.     Tenderness: There is no abdominal tenderness.  Musculoskeletal:        General: Normal range of motion.     Right lower leg: No edema.     Left lower leg: No edema.     Comments: Slight superior right shoulder tenderness on extreme abduction Negative Hawkin and Neer signs  Neurological:     Mental Status: He is alert and oriented to person, place, and time.  Psychiatric:        Mood and Affect: Mood normal.        Latest Ref Rng & Units 07/29/2024   10:34 AM 07/25/2024    2:00 AM 07/24/2024   10:49 AM  CMP  Glucose 70 - 99 mg/dL 665  845  855   BUN 6 - 24 mg/dL 21  30  38   Creatinine 0.76 - 1.27 mg/dL 8.86  8.58  8.34   Sodium 134 - 144 mmol/L 136  142  150   Potassium 3.5 - 5.2 mmol/L 4.8  3.5  4.0   Chloride 96 - 106 mmol/L 100  105  114   CO2 20 - 29 mmol/L 23  26  25    Calcium  8.7 - 10.2 mg/dL 8.6  8.3  9.1   Total Protein 6.0 - 8.5 g/dL 6.4     Total Bilirubin 0.0 - 1.2 mg/dL 0.3     Alkaline Phos 47 - 123 IU/L 104     AST 0 - 40 IU/L 21     ALT 0 - 44 IU/L 24       Lipid Panel     Component Value Date/Time   CHOL 186 01/25/2024 1127   TRIG 63 01/25/2024 1127   HDL 44 01/25/2024 1127   CHOLHDL 4.2 01/25/2024 1127   CHOLHDL 5.8 06/26/2022 0128   VLDL 18 06/26/2022 0128   LDLCALC 130 (H) 01/25/2024 1127    CBC    Component Value Date/Time   WBC 8.0  07/23/2024 1344   RBC 4.22 07/23/2024 1344   HGB 14.3 07/23/2024 1406   HGB 12.5 (L) 01/25/2024 1127   HCT 42.0 07/23/2024 1406   HCT 38.2 01/25/2024 1127   PLT 374 07/23/2024 1344   PLT 335 01/25/2024 1127   MCV 106.6 (H) 07/23/2024 1344   MCV 106 (H) 01/25/2024 1127   MCH 33.6 07/23/2024 1344   MCHC 31.6 07/23/2024 1344   RDW 11.6 07/23/2024 1344   RDW 11.4 (L) 01/25/2024 1127   LYMPHSABS 0.7 07/23/2024 1344   LYMPHSABS 1.4 01/25/2024 1127   MONOABS 0.3 07/23/2024 1344   EOSABS 0.0 07/23/2024 1344   EOSABS 0.2 01/25/2024 1127   BASOSABS 0.1 07/23/2024 1344   BASOSABS 0.1 01/25/2024 1127    Lab Results  Component Value Date   HGBA1C 12.1 (H) 07/23/2024       Assessment & Plan Type 2 diabetes mellitus with hypoglycemia without coma. Blood glucose levels fluctuating with morning hyperglycemia and occasional hypoglycemia. Current insulin  regimen adjustments needed. -Hypoglycemia occurred when he administered 46 units agreed-upon at his clinical pharmacy visit hence he decreased his Basaglar   dose to 20 units - Increased Basaglar  to 25 units daily and if blood sugars are remaining persistently above 300 he needs to increase his Basaglar  to 30 units. - Provided new sliding scale for Novolog : 0-12 units based on blood glucose. -For blood sugars 0-150 give 0 units of insulin , 151-200 give 2 units of insulin , 201-250 give 4 units, 251-300 give 6 units, 301-350 give 8 units, 351-400 give 10 units,> 400 give 12 units and call M.D. Discussed hypoglycemia protocol. - Monitor blood glucose and adjust insulin  as needed. - Educated on insulin  dose adjustments based on glucose readings.  Right shoulder pain Right shoulder pain likely due to occupational activities, suggesting inflammation or rotator cuff involvement. - Prescribed meloxicam  once daily. - Consider referral for physical therapy, x-ray, and orthopedic evaluation for corticosteroid injections if pain persists.      Meds  ordered this encounter  Medications   meloxicam  (MOBIC ) 7.5 MG tablet    Sig: Take 1 tablet (7.5 mg total) by mouth daily.    Dispense:  30 tablet    Refill:  1   Insulin  Glargine (BASAGLAR  KWIKPEN) 100 UNIT/ML    Sig: Inject 25 Units into the skin daily. Increase to 30 units if sugars are above 300.    Dispense:  30 mL    Refill:  3    Dose change    Follow-up: Return in about 1 month (around 09/23/2024) for Diabetes follow-up with PCP.       Corrina Sabin, MD, FAAFP. [facility] [place], [place] [phone]   08/23/2024, 12:49 PM    [1] No Known Allergies  "

## 2024-08-25 ENCOUNTER — Ambulatory Visit: Payer: Self-pay | Attending: Family Medicine

## 2024-08-26 ENCOUNTER — Ambulatory Visit: Payer: Self-pay | Admitting: Family Medicine

## 2024-08-26 ENCOUNTER — Other Ambulatory Visit: Payer: Self-pay

## 2024-08-26 DIAGNOSIS — E1165 Type 2 diabetes mellitus with hyperglycemia: Secondary | ICD-10-CM

## 2024-08-26 LAB — MICROALBUMIN / CREATININE URINE RATIO
Creatinine, Urine: 43.1 mg/dL
Microalb/Creat Ratio: 8 mg/g{creat} (ref 0–29)
Microalbumin, Urine: 3.6 ug/mL

## 2024-08-26 LAB — PSA, TOTAL AND FREE
PSA, Free Pct: 29.4 %
PSA, Free: 0.47 ng/mL
Prostate Specific Ag, Serum: 1.6 ng/mL (ref 0.0–4.0)

## 2024-08-26 LAB — LP+NON-HDL CHOLESTEROL
Cholesterol, Total: 195 mg/dL (ref 100–199)
HDL: 55 mg/dL
LDL Chol Calc (NIH): 114 mg/dL — ABNORMAL HIGH (ref 0–99)
Total Non-HDL-Chol (LDL+VLDL): 140 mg/dL — ABNORMAL HIGH (ref 0–129)
Triglycerides: 151 mg/dL — ABNORMAL HIGH (ref 0–149)
VLDL Cholesterol Cal: 26 mg/dL (ref 5–40)

## 2024-08-26 MED ORDER — ATORVASTATIN CALCIUM 40 MG PO TABS
40.0000 mg | ORAL_TABLET | Freq: Every day | ORAL | 1 refills | Status: AC
  Filled 2024-08-26: qty 90, 90d supply, fill #0

## 2024-08-30 ENCOUNTER — Telehealth: Payer: Self-pay

## 2024-08-30 NOTE — Telephone Encounter (Signed)
 Patient asking about results of PSA testing.

## 2024-08-30 NOTE — Telephone Encounter (Signed)
 Lvm informing patient that results are normal.

## 2024-08-30 NOTE — Telephone Encounter (Signed)
 Copied from CRM [id number]. Topic: Clinical - Lab/Test Results >> Aug 30, 2024 11:04 AM Antwanette L wrote: Reason for CRM:  Lab results were relayed to the patient, and he would like to know whether the provider tested his prostate. The patient can be reached at [phone]

## 2024-09-27 ENCOUNTER — Ambulatory Visit: Payer: Self-pay | Admitting: Family Medicine
